# Patient Record
Sex: Female | Born: 1970 | Race: White | Hispanic: No | Smoking: Former smoker
Health system: Southern US, Community
[De-identification: ages and names within clinical notes are randomized; demographics above are authoritative.]

## PROBLEM LIST (undated history)

## (undated) DIAGNOSIS — F32A Depression, unspecified: Secondary | ICD-10-CM

## (undated) DIAGNOSIS — F4310 Post-traumatic stress disorder, unspecified: Secondary | ICD-10-CM

## (undated) DIAGNOSIS — R112 Nausea with vomiting, unspecified: Secondary | ICD-10-CM

## (undated) DIAGNOSIS — F319 Bipolar disorder, unspecified: Secondary | ICD-10-CM

## (undated) DIAGNOSIS — F419 Anxiety disorder, unspecified: Secondary | ICD-10-CM

## (undated) DIAGNOSIS — F329 Major depressive disorder, single episode, unspecified: Secondary | ICD-10-CM

## (undated) DIAGNOSIS — M797 Fibromyalgia: Secondary | ICD-10-CM

## (undated) DIAGNOSIS — R011 Cardiac murmur, unspecified: Secondary | ICD-10-CM

## (undated) DIAGNOSIS — Z9889 Other specified postprocedural states: Secondary | ICD-10-CM

## (undated) DIAGNOSIS — N946 Dysmenorrhea, unspecified: Secondary | ICD-10-CM

## (undated) DIAGNOSIS — D259 Leiomyoma of uterus, unspecified: Secondary | ICD-10-CM

## (undated) DIAGNOSIS — I10 Essential (primary) hypertension: Secondary | ICD-10-CM

## (undated) HISTORY — PX: DILATION AND CURETTAGE OF UTERUS: SHX78

## (undated) HISTORY — DX: Cardiac murmur, unspecified: R01.1

## (undated) HISTORY — DX: Leiomyoma of uterus, unspecified: D25.9

## (undated) HISTORY — DX: Post-traumatic stress disorder, unspecified: F43.10

## (undated) HISTORY — PX: ENDOMETRIAL ABLATION: SHX621

## (undated) HISTORY — DX: Bipolar disorder, unspecified: F31.9

## (undated) HISTORY — DX: Fibromyalgia: M79.7

---

## 1997-02-25 ENCOUNTER — Encounter (INDEPENDENT_AMBULATORY_CARE_PROVIDER_SITE_OTHER): Payer: Self-pay | Admitting: *Deleted

## 1998-02-07 ENCOUNTER — Encounter: Admission: RE | Admit: 1998-02-07 | Discharge: 1998-02-07 | Payer: Self-pay | Admitting: Family Medicine

## 1998-02-17 ENCOUNTER — Encounter: Admission: RE | Admit: 1998-02-17 | Discharge: 1998-02-17 | Payer: Self-pay | Admitting: Family Medicine

## 1998-03-07 ENCOUNTER — Encounter: Admission: RE | Admit: 1998-03-07 | Discharge: 1998-03-07 | Payer: Self-pay | Admitting: Family Medicine

## 1998-03-07 ENCOUNTER — Ambulatory Visit (HOSPITAL_COMMUNITY): Admission: RE | Admit: 1998-03-07 | Discharge: 1998-03-07 | Payer: Self-pay | Admitting: *Deleted

## 1998-03-28 ENCOUNTER — Encounter: Admission: RE | Admit: 1998-03-28 | Discharge: 1998-03-28 | Payer: Self-pay | Admitting: Family Medicine

## 1998-11-17 ENCOUNTER — Inpatient Hospital Stay (HOSPITAL_COMMUNITY): Admission: AD | Admit: 1998-11-17 | Discharge: 1998-11-17 | Payer: Self-pay | Admitting: Obstetrics and Gynecology

## 1999-02-06 ENCOUNTER — Encounter: Admission: RE | Admit: 1999-02-06 | Discharge: 1999-02-06 | Payer: Self-pay | Admitting: Family Medicine

## 1999-02-08 ENCOUNTER — Emergency Department (HOSPITAL_COMMUNITY): Admission: EM | Admit: 1999-02-08 | Discharge: 1999-02-08 | Payer: Self-pay | Admitting: Emergency Medicine

## 1999-03-30 ENCOUNTER — Encounter: Admission: RE | Admit: 1999-03-30 | Discharge: 1999-03-30 | Payer: Self-pay | Admitting: Family Medicine

## 2000-01-12 ENCOUNTER — Ambulatory Visit: Admission: RE | Admit: 2000-01-12 | Discharge: 2000-01-12 | Payer: Self-pay | Admitting: Obstetrics and Gynecology

## 2000-01-25 ENCOUNTER — Ambulatory Visit (HOSPITAL_COMMUNITY): Admission: RE | Admit: 2000-01-25 | Discharge: 2000-01-25 | Payer: Self-pay | Admitting: Obstetrics and Gynecology

## 2000-01-25 ENCOUNTER — Encounter: Payer: Self-pay | Admitting: Obstetrics and Gynecology

## 2000-06-27 ENCOUNTER — Inpatient Hospital Stay (HOSPITAL_COMMUNITY): Admission: AD | Admit: 2000-06-27 | Discharge: 2000-06-30 | Payer: Self-pay | Admitting: Obstetrics and Gynecology

## 2000-08-10 ENCOUNTER — Other Ambulatory Visit: Admission: RE | Admit: 2000-08-10 | Discharge: 2000-08-10 | Payer: Self-pay | Admitting: Obstetrics and Gynecology

## 2000-10-25 ENCOUNTER — Encounter: Admission: RE | Admit: 2000-10-25 | Discharge: 2000-10-25 | Payer: Self-pay | Admitting: Family Medicine

## 2000-10-25 ENCOUNTER — Ambulatory Visit (HOSPITAL_COMMUNITY): Admission: RE | Admit: 2000-10-25 | Discharge: 2000-10-25 | Payer: Self-pay | Admitting: Family Medicine

## 2001-07-10 ENCOUNTER — Encounter: Admission: RE | Admit: 2001-07-10 | Discharge: 2001-07-10 | Payer: Self-pay | Admitting: Family Medicine

## 2001-07-13 ENCOUNTER — Encounter: Admission: RE | Admit: 2001-07-13 | Discharge: 2001-07-13 | Payer: Self-pay | Admitting: Family Medicine

## 2001-09-07 ENCOUNTER — Encounter: Admission: RE | Admit: 2001-09-07 | Discharge: 2001-09-07 | Payer: Self-pay | Admitting: Family Medicine

## 2001-12-02 ENCOUNTER — Encounter: Payer: Self-pay | Admitting: Emergency Medicine

## 2001-12-02 ENCOUNTER — Emergency Department (HOSPITAL_COMMUNITY): Admission: EM | Admit: 2001-12-02 | Discharge: 2001-12-02 | Payer: Self-pay | Admitting: Emergency Medicine

## 2002-03-22 ENCOUNTER — Encounter: Admission: RE | Admit: 2002-03-22 | Discharge: 2002-03-22 | Payer: Self-pay | Admitting: Family Medicine

## 2002-04-15 ENCOUNTER — Inpatient Hospital Stay (HOSPITAL_COMMUNITY): Admission: AD | Admit: 2002-04-15 | Discharge: 2002-04-15 | Payer: Self-pay | Admitting: Obstetrics and Gynecology

## 2002-04-15 ENCOUNTER — Encounter: Payer: Self-pay | Admitting: Obstetrics and Gynecology

## 2002-04-16 ENCOUNTER — Ambulatory Visit (HOSPITAL_COMMUNITY): Admission: AD | Admit: 2002-04-16 | Discharge: 2002-04-16 | Payer: Self-pay | Admitting: Obstetrics and Gynecology

## 2002-04-16 ENCOUNTER — Encounter (INDEPENDENT_AMBULATORY_CARE_PROVIDER_SITE_OTHER): Payer: Self-pay | Admitting: Specialist

## 2002-05-23 ENCOUNTER — Other Ambulatory Visit: Admission: RE | Admit: 2002-05-23 | Discharge: 2002-05-23 | Payer: Self-pay | Admitting: Obstetrics and Gynecology

## 2002-12-10 ENCOUNTER — Encounter: Admission: RE | Admit: 2002-12-10 | Discharge: 2002-12-10 | Payer: Self-pay | Admitting: Family Medicine

## 2003-03-30 ENCOUNTER — Emergency Department (HOSPITAL_COMMUNITY): Admission: EM | Admit: 2003-03-30 | Discharge: 2003-03-30 | Payer: Self-pay | Admitting: Emergency Medicine

## 2003-04-02 ENCOUNTER — Encounter: Admission: RE | Admit: 2003-04-02 | Discharge: 2003-04-02 | Payer: Self-pay | Admitting: Family Medicine

## 2003-07-24 ENCOUNTER — Inpatient Hospital Stay (HOSPITAL_COMMUNITY): Admission: AD | Admit: 2003-07-24 | Discharge: 2003-07-24 | Payer: Self-pay | Admitting: Obstetrics and Gynecology

## 2004-02-17 ENCOUNTER — Other Ambulatory Visit: Admission: RE | Admit: 2004-02-17 | Discharge: 2004-02-17 | Payer: Self-pay | Admitting: Obstetrics and Gynecology

## 2004-08-21 ENCOUNTER — Emergency Department (HOSPITAL_COMMUNITY): Admission: EM | Admit: 2004-08-21 | Discharge: 2004-08-21 | Payer: Self-pay | Admitting: Family Medicine

## 2004-11-09 ENCOUNTER — Emergency Department (HOSPITAL_COMMUNITY): Admission: EM | Admit: 2004-11-09 | Discharge: 2004-11-09 | Payer: Self-pay | Admitting: Family Medicine

## 2004-12-05 ENCOUNTER — Emergency Department (HOSPITAL_COMMUNITY): Admission: EM | Admit: 2004-12-05 | Discharge: 2004-12-05 | Payer: Self-pay | Admitting: Emergency Medicine

## 2004-12-08 ENCOUNTER — Emergency Department (HOSPITAL_COMMUNITY): Admission: EM | Admit: 2004-12-08 | Discharge: 2004-12-08 | Payer: Self-pay | Admitting: Family Medicine

## 2004-12-21 ENCOUNTER — Emergency Department (HOSPITAL_COMMUNITY): Admission: EM | Admit: 2004-12-21 | Discharge: 2004-12-21 | Payer: Self-pay | Admitting: Family Medicine

## 2004-12-23 ENCOUNTER — Emergency Department (HOSPITAL_COMMUNITY): Admission: EM | Admit: 2004-12-23 | Discharge: 2004-12-23 | Payer: Self-pay | Admitting: Emergency Medicine

## 2005-01-14 ENCOUNTER — Encounter (INDEPENDENT_AMBULATORY_CARE_PROVIDER_SITE_OTHER): Payer: Self-pay | Admitting: *Deleted

## 2005-01-14 ENCOUNTER — Ambulatory Visit: Payer: Self-pay | Admitting: Obstetrics and Gynecology

## 2005-01-28 ENCOUNTER — Ambulatory Visit: Payer: Self-pay | Admitting: Obstetrics and Gynecology

## 2005-02-23 ENCOUNTER — Emergency Department (HOSPITAL_COMMUNITY): Admission: EM | Admit: 2005-02-23 | Discharge: 2005-02-23 | Payer: Self-pay | Admitting: Family Medicine

## 2005-10-07 ENCOUNTER — Emergency Department (HOSPITAL_COMMUNITY): Admission: EM | Admit: 2005-10-07 | Discharge: 2005-10-07 | Payer: Self-pay | Admitting: Emergency Medicine

## 2006-04-07 ENCOUNTER — Inpatient Hospital Stay (HOSPITAL_COMMUNITY): Admission: AD | Admit: 2006-04-07 | Discharge: 2006-04-07 | Payer: Self-pay | Admitting: Gynecology

## 2006-07-20 ENCOUNTER — Emergency Department (HOSPITAL_COMMUNITY): Admission: EM | Admit: 2006-07-20 | Discharge: 2006-07-20 | Payer: Self-pay | Admitting: Family Medicine

## 2006-09-29 ENCOUNTER — Ambulatory Visit (HOSPITAL_COMMUNITY): Admission: RE | Admit: 2006-09-29 | Discharge: 2006-09-29 | Payer: Self-pay | Admitting: Obstetrics

## 2006-11-24 DIAGNOSIS — F410 Panic disorder [episodic paroxysmal anxiety] without agoraphobia: Secondary | ICD-10-CM | POA: Insufficient documentation

## 2006-11-24 DIAGNOSIS — F329 Major depressive disorder, single episode, unspecified: Secondary | ICD-10-CM

## 2006-11-24 DIAGNOSIS — R079 Chest pain, unspecified: Secondary | ICD-10-CM | POA: Insufficient documentation

## 2006-11-24 DIAGNOSIS — F3289 Other specified depressive episodes: Secondary | ICD-10-CM | POA: Insufficient documentation

## 2006-11-24 DIAGNOSIS — I059 Rheumatic mitral valve disease, unspecified: Secondary | ICD-10-CM | POA: Insufficient documentation

## 2006-11-25 ENCOUNTER — Encounter (INDEPENDENT_AMBULATORY_CARE_PROVIDER_SITE_OTHER): Payer: Self-pay | Admitting: *Deleted

## 2006-12-21 ENCOUNTER — Emergency Department (HOSPITAL_COMMUNITY): Admission: EM | Admit: 2006-12-21 | Discharge: 2006-12-21 | Payer: Self-pay | Admitting: Family Medicine

## 2007-01-22 ENCOUNTER — Inpatient Hospital Stay (HOSPITAL_COMMUNITY): Admission: AD | Admit: 2007-01-22 | Discharge: 2007-01-22 | Payer: Self-pay | Admitting: Obstetrics & Gynecology

## 2007-02-10 ENCOUNTER — Ambulatory Visit (HOSPITAL_COMMUNITY): Admission: RE | Admit: 2007-02-10 | Discharge: 2007-02-10 | Payer: Self-pay | Admitting: Obstetrics

## 2007-02-10 ENCOUNTER — Encounter: Payer: Self-pay | Admitting: Obstetrics

## 2007-06-08 ENCOUNTER — Ambulatory Visit (HOSPITAL_COMMUNITY): Admission: RE | Admit: 2007-06-08 | Discharge: 2007-06-08 | Payer: Self-pay | Admitting: Obstetrics

## 2008-01-22 ENCOUNTER — Inpatient Hospital Stay (HOSPITAL_COMMUNITY): Admission: AD | Admit: 2008-01-22 | Discharge: 2008-01-23 | Payer: Self-pay | Admitting: Obstetrics & Gynecology

## 2008-02-07 ENCOUNTER — Ambulatory Visit: Payer: Self-pay | Admitting: Obstetrics and Gynecology

## 2008-02-13 ENCOUNTER — Ambulatory Visit (HOSPITAL_COMMUNITY): Admission: RE | Admit: 2008-02-13 | Discharge: 2008-02-13 | Payer: Self-pay | Admitting: Obstetrics and Gynecology

## 2008-02-28 ENCOUNTER — Ambulatory Visit: Payer: Self-pay | Admitting: Obstetrics and Gynecology

## 2009-02-23 ENCOUNTER — Emergency Department (HOSPITAL_COMMUNITY): Admission: EM | Admit: 2009-02-23 | Discharge: 2009-02-24 | Payer: Self-pay | Admitting: Emergency Medicine

## 2009-05-21 ENCOUNTER — Encounter: Payer: Self-pay | Admitting: Cardiovascular Disease

## 2009-06-12 ENCOUNTER — Ambulatory Visit: Payer: Self-pay | Admitting: Cardiovascular Disease

## 2009-06-12 ENCOUNTER — Ambulatory Visit: Payer: Self-pay

## 2009-06-12 DIAGNOSIS — R002 Palpitations: Secondary | ICD-10-CM | POA: Insufficient documentation

## 2009-07-15 ENCOUNTER — Ambulatory Visit: Payer: Self-pay

## 2009-07-15 ENCOUNTER — Ambulatory Visit: Payer: Self-pay | Admitting: Cardiovascular Disease

## 2009-07-15 ENCOUNTER — Ambulatory Visit (HOSPITAL_COMMUNITY): Admission: RE | Admit: 2009-07-15 | Discharge: 2009-07-15 | Payer: Self-pay | Admitting: Internal Medicine

## 2009-07-15 ENCOUNTER — Encounter: Payer: Self-pay | Admitting: Cardiovascular Disease

## 2009-07-23 ENCOUNTER — Ambulatory Visit: Payer: Self-pay | Admitting: Cardiovascular Disease

## 2009-07-23 DIAGNOSIS — F17200 Nicotine dependence, unspecified, uncomplicated: Secondary | ICD-10-CM | POA: Insufficient documentation

## 2011-01-05 LAB — URINALYSIS, ROUTINE W REFLEX MICROSCOPIC
Glucose, UA: NEGATIVE mg/dL
Ketones, ur: NEGATIVE mg/dL
Nitrite: NEGATIVE
Specific Gravity, Urine: 1.023 (ref 1.005–1.030)

## 2011-01-05 LAB — POCT I-STAT, CHEM 8
BUN: 13 mg/dL (ref 6–23)
Chloride: 108 mEq/L (ref 96–112)
Glucose, Bld: 106 mg/dL — ABNORMAL HIGH (ref 70–99)
Hemoglobin: 13.6 g/dL (ref 12.0–15.0)
Sodium: 141 mEq/L (ref 135–145)

## 2011-01-05 LAB — POCT CARDIAC MARKERS
CKMB, poc: <1 ng/mL — ABNORMAL LOW (ref 1.0–8.0)
Troponin i, poc: <0.05 ng/mL (ref 0.00–0.09)

## 2011-01-05 LAB — PREGNANCY, URINE: Preg Test, Ur: NEGATIVE

## 2011-02-09 NOTE — Group Therapy Note (Signed)
[name], Lori Jensen NO.:  [id number]   MEDICAL RECORD NO.:  [id number]          PATIENT TYPE:  WOC   LOCATION:  [facility]                   FACILITY:  [facility]   PHYSICIAN:  Argentina Donovan, MD        DATE OF BIRTH:  [birth year]   DATE OF SERVICE:  02/28/2008                                  CLINIC NOTE   The patient is a 40 year old white female who is a nonsmoker at the  present time who was a patient of Dr. Verdell Carmine and had tried many things  to control her heavy periods.  She did not want anything definite done  because she did not know if she wanted any more children for the future.  So she placed, somewhat with difficulty apparently, a Mirena IUD.  She  continued, although the bleeding less, she continued having, and they  did an ultrasound showed that the location was in the lower uterine  segment, the T portion appearing to be in the myometrium.  We have  discussed this with the patient and she desired removed.  We told her we  could replace it in several weeks to a month if she wanted.  In the  interim, since she is a nonsmoker, we could place her on Yaz.  She  thought that was a good idea, and she may continue on the Yaz if it is  satisfactory at controlling the bleeding.  She is not quite ready to  give up the idea of future childbearing, so hysterectomy and endometrial  ablation at the present time is out of the question.  The patient  decides she is going to have a replaced IUD, I would suggest that we pre  medicate her with Cytotec prior to her coming in, as they apparently had  a difficult time on insertion.   IMPRESSION:  Menometrorrhagia and intrauterine device that is not in  place properly and was removed.           ______________________________  Argentina Donovan, MD     PR/MEDQ  D:  02/28/2008  T:  02/28/2008  Job:  [id number]

## 2011-02-09 NOTE — Op Note (Signed)
NAMEKHALESSI, BLOUGH              ACCOUNT NO.:  [id number]   MEDICAL RECORD NO.:  [id number]          PATIENT TYPE:  AMB   LOCATION:  [facility]                           [facility]  [facility]   PHYSICIAN:  Charles A. Clearance Coots, M.D.DATE OF BIRTH:  [birth year]   DATE OF PROCEDURE:  02/10/2007  DATE OF DISCHARGE:                               OPERATIVE REPORT   PREOPERATIVE DIAGNOSIS:  Menorrhagia.   POSTOPERATIVE DIAGNOSIS:  Menorrhagia.   PROCEDURE:  Hysteroscopy, dilation and curettage.   SURGEON:  Coral Ceo, M.D.   ANESTHESIA:  MAC with paracervical block.   COMPLICATIONS:  None.   ESTIMATED BLOOD LOSS:  50 ml.   SPECIMEN:  Endometrial curettings.   FINDINGS:  Thick secretory appearing endometrial cavity.  No significant  polyps seen.   OPERATION:  The patient was brought to the operating room and after IV  sedation, legs were brought up in stirrups and the vagina was prepped  and draped in the usual sterile fashion.  The urinary bladder was  emptied of approximately 50 mL of clear urine.  Bimanual examination  revealed the uterus to be mid position, normal size.  Sterile weighted  speculum was inserted into the vaginal vault and the cervix was isolated  with a Sims retractor.  Anterior lip of the cervix was grasped with a  single-tooth tenaculum.  The 5 mm hysteroscope was easily introduced to  the uterine cavity and hysteroscopic survey was performed.  The  endometrium appeared lush and secretory from Premarin therapy that the  patient had been on.  There were no obvious polyps observed.  The  hysteroscope was removed and a small sharp curette was introduced into  the uterine cavity.  The endometrial surface was thoroughly curetted and  the specimen was submitted to pathology for evaluation.  All instruments  were then retired.  The patient tolerated the procedure well and was  transported to the recovery room in satisfactory condition.      Charles A. Clearance Coots, M.D.  Electronically Signed     CAH/MEDQ  D:  02/10/2007  T:  02/10/2007  Job:  [id number]

## 2011-02-12 NOTE — Discharge Summary (Signed)
[facility]  Patient:    Lori Jensen, Lori Jensen                     MRN: [id number] Adm. Date:  [id number] Disc. Date: [id number] Attending:  Michaele Offer                           Discharge Summary  ADMISSION DIAGNOSES:          1. Intrauterine pregnancy at 40 weeks.                               2. Prolonged premature rupture of membranes.  DISCHARGE DIAGNOSES:          1. Intrauterine pregnancy at 40 weeks.                               2. Prolonged premature rupture of membranes.                               3. Delivered.  PROCEDURES:                   Spontaneous vaginal delivery.  COMPLICATIONS:                None.  CONSULTATIONS:                None.  HISTORY AND PHYSICAL:         This is a 40 year old white female gravida 3, para 1-0-1-1 with an EGA of [redacted] weeks by an LMP consistent with an eight week ultrasound with an EDC of June 27, 2000, who presents with complaint of leaking fluid since June 25, 2000, which increased the evening prior to admission and on the morning of admission with a few contractions, good fetal movement and no vaginal bleeding. Evaluation in the office revealed positive Nitrazine and positive fern.  Prenatal care essentially uncomplicated.  The patient had a questionable history of mitral valve prolapse and had some palpitations in the second trimester. She had an echocardiogram performed which revealed no mitral valve prolapse.  PRENATAL LABS:                Blood type is O positive with a negative antibody screen.  RPR nonreactive.  Rubella equivocal.  Hepatitis B surface antigen negative.  Gonorrhea and chlamydia negative.  Triple screen normal. Glucose 92 and group B strep is negative.  PAST OB HISTORY:              In 1994, a vaginal delivery at 40 weeks, 7 pounds 14 ounces, complicated by meconium.  In 2000 she had a spontaneous abortion.  GYN HISTORY:                  Significant for history  of condylomata and history of an abnormal Pap smear with a normal biopsy.  ALLERGIES:                    CODEINE.  SOCIAL HISTORY:               She smoked less than half a pack a day during her pregnancy.  PHYSICAL EXAMINATION:  VITAL SIGNS:  She was afebrile with stable vital signs.  Fetal heart tracing was reactive with occasional mild variable deceleration and she was initially having irregular contractions.  ABDOMEN:                      Gravid and nontender with an estimated fetal weight of 7-1/2 pounds.  VAGINAL:                      On June 21, 2000, examination was 3, 50 and -2 and she was confirmed to have ruptured membranes on June 27, 2000.  HOSPITAL COURSE:              The patient was admitted and started on Pitocin induction for prolonged rupture of membranes.  She did develop regular contractions on the Pitocin but it did take quite a while to get her into active labor.  She did eventually receive an epidural when she got uncomfortable and this helped her enter active labor.  Once she did enter active labor, she finally progressed fairly well, reached complete and pushed well.  On the morning of June 28, 2000, she had an SVD of a viable female infant in the ROA position with Apgars of 7 and 8 with weight of 7 pounds 6 ounces over small perineal abrasions.  She had a loose nuchal cord x 1 which was reduced.  The placenta delivered spontaneous and was intact.  Her estimated blood loss was approximately 500 cc and her abrasions were hemostatic and not repaired.  Postpartum, her T-max was 100.2 and she was otherwise afebrile.  Predelivery hemoglobin was 13.4, post delivery 10.3.  She breast-fed her baby without complications and on the morning of postpartum day #2 she was stable for discharge home.  CONDITION ON DISCHARGE:       Stable.  DISPOSITION:                  The patient was discharged to home.  DISCHARGE DIET:               Her diet  is a regular diet.  ACTIVITY:                     Pelvic rest.  FOLLOW-UP:                    She will follow up in four to six weeks.  DISCHARGE MEDICATIONS:        Darvocet p.r.n. pain.  DISCHARGE INSTRUCTIONS:       She is given a discharge pamphlet. DD:  06/30/00 TD:  06/30/00 Job: [id number]

## 2011-02-12 NOTE — Op Note (Signed)
[facility]  Patient:    Lori Jensen, Lori Jensen Visit Number: [id number] MRN: [id number]          Service Type: DSU Location: [facility] Attending Physician:  Oliver Pila Dictated by:   Alvino Chapel, M.D. Proc. Date: 04/16/02 Admit Date:  04/16/2002 Discharge Date: 04/16/2002                             Operative Report  PREOPERATIVE DIAGNOSIS:       Incomplete abortion.  POSTOPERATIVE DIAGNOSIS:      Incomplete abortion.  OPERATION:                    Suction dilatation and curettage.  SURGEON:                      Alvino Chapel, M.D.  ANESTHESIA:                   LMAC and local, 1% lidocaine on the cervical block.  FINDINGS:                     The cervix was about 1 cm dilated with a 7-mm curette easily passed. There was a very small amount of tissue removed in two to three passes of the suction curette and the uterus contracted nicely at the conclusion of the procedure. There was minimal vaginal bleeding at the end of the procedure.  DESCRIPTION OF PROCEDURE:     The patient was taken to the operating room where [facility] anesthesia was obtained without difficulty. She was then prepped and draped in normal sterile fashion in the dorsal lithotomy position. Lidocaine 1% was then injected circumferentially around the cervix for a local block and then with the patient under [facility] anesthesia, the uterus sounded to approximately 7-8 cm. A 7-mm curette was placed within the uterus and in several passes a very small amount of retained products of conception were removed. At the conclusion of the procedure, the patient had minimal vaginal bleeding and one pass of a sharp curettage revealed no further products of conception. The specimen was sent with a specimen that was originally brought in by the patient to maternity admissions for evaluation and she was taken to the recovery room in stable condition. Dictated by:   Alvino Chapel, M.D. Attending Physician:  Oliver Pila DD:  04/16/02 TD:  04/19/02 Job: [id number]

## 2011-03-08 ENCOUNTER — Emergency Department (HOSPITAL_BASED_OUTPATIENT_CLINIC_OR_DEPARTMENT_OTHER)
Admission: EM | Admit: 2011-03-08 | Discharge: 2011-03-08 | Disposition: A | Discharge status: Home / Self Care | Payer: Managed Care, Other (non HMO) | Attending: Emergency Medicine | Admitting: Emergency Medicine

## 2011-03-08 DIAGNOSIS — H8110 Benign paroxysmal vertigo, unspecified ear: Secondary | ICD-10-CM | POA: Insufficient documentation

## 2011-03-08 LAB — PREGNANCY, URINE: Preg Test, Ur: NEGATIVE

## 2011-06-22 LAB — CBC
HCT: 35.6 — ABNORMAL LOW
RDW: 14.7
WBC: 8.6

## 2011-06-22 LAB — WET PREP, GENITAL

## 2011-07-22 ENCOUNTER — Encounter (HOSPITAL_COMMUNITY): Payer: Self-pay

## 2011-07-22 ENCOUNTER — Inpatient Hospital Stay (HOSPITAL_COMMUNITY): Payer: Managed Care, Other (non HMO)

## 2011-07-22 ENCOUNTER — Inpatient Hospital Stay (HOSPITAL_COMMUNITY)
Admission: AD | Admit: 2011-07-22 | Discharge: 2011-07-22 | Disposition: A | Discharge status: Home / Self Care | Payer: Managed Care, Other (non HMO) | Source: Ambulatory Visit | Attending: Obstetrics & Gynecology | Admitting: Obstetrics & Gynecology

## 2011-07-22 DIAGNOSIS — D259 Leiomyoma of uterus, unspecified: Secondary | ICD-10-CM

## 2011-07-22 DIAGNOSIS — N938 Other specified abnormal uterine and vaginal bleeding: Secondary | ICD-10-CM | POA: Insufficient documentation

## 2011-07-22 DIAGNOSIS — N939 Abnormal uterine and vaginal bleeding, unspecified: Secondary | ICD-10-CM

## 2011-07-22 DIAGNOSIS — N949 Unspecified condition associated with female genital organs and menstrual cycle: Secondary | ICD-10-CM | POA: Insufficient documentation

## 2011-07-22 DIAGNOSIS — IMO0001 Reserved for inherently not codable concepts without codable children: Secondary | ICD-10-CM

## 2011-07-22 DIAGNOSIS — N898 Other specified noninflammatory disorders of vagina: Secondary | ICD-10-CM

## 2011-07-22 HISTORY — DX: Anxiety disorder, unspecified: F41.9

## 2011-07-22 HISTORY — DX: Essential (primary) hypertension: I10

## 2011-07-22 HISTORY — DX: Dysmenorrhea, unspecified: N94.6

## 2011-07-22 HISTORY — DX: Major depressive disorder, single episode, unspecified: F32.9

## 2011-07-22 HISTORY — DX: Depression, unspecified: F32.A

## 2011-07-22 LAB — URINALYSIS, ROUTINE W REFLEX MICROSCOPIC
Bilirubin Urine: NEGATIVE
Ketones, ur: 15 mg/dL — AB
Leukocytes, UA: NEGATIVE
Nitrite: NEGATIVE
Protein, ur: NEGATIVE mg/dL
Urobilinogen, UA: 2 mg/dL — ABNORMAL HIGH (ref 0.0–1.0)
pH: 7 (ref 5.0–8.0)

## 2011-07-22 LAB — CBC
HCT: 38.4 % (ref 36.0–46.0)
MCHC: 34.1 g/dL (ref 30.0–36.0)
Platelets: 185 10*3/uL (ref 150–400)
RDW: 13.4 % (ref 11.5–15.5)
WBC: 7.9 10*3/uL (ref 4.0–10.5)

## 2011-07-22 LAB — POCT PREGNANCY, URINE: Preg Test, Ur: NEGATIVE

## 2011-07-22 MED ORDER — MEDROXYPROGESTERONE ACETATE 10 MG PO TABS: 10.0000 mg | ORAL_TABLET | Freq: Every day | ORAL | Status: DC

## 2011-07-22 MED ORDER — KETOROLAC TROMETHAMINE 60 MG/2ML IM SOLN
60.0000 mg | Freq: Once | INTRAMUSCULAR | Status: AC
  Administered 2011-07-22: 60 mg via INTRAMUSCULAR
  Filled 2011-07-22: qty 2

## 2011-07-22 MED ORDER — NAPROXEN SODIUM 550 MG PO TABS: 550.0000 mg | ORAL_TABLET | Freq: Two times a day (BID) | ORAL | Status: DC

## 2011-07-22 NOTE — Progress Notes (Signed)
GAVE WARM BLANKET. SMALL AMT BLEEDING PER PT

## 2011-07-22 NOTE — ED Provider Notes (Signed)
History     Chief Complaint  Patient presents with  . Vaginal Bleeding   HPINicole K Saleeby40 y.o.presents with complaints of heavy vaginal bleeding for 6 weeks and stopped for 3 days and then 3 days ago began again.  Has appt with Nestor Ramp for Nov 13.  Passing blood clots.  Had same sxs several years ago.  Treated by Dr. Clearance Coots  with multiple medications, ending with a D&C.  Tried Mirena and "my baby pushed it out".  Suggestion made for ablation or partial hysterectomy.  Reports mild abdominal pain on the right and comes and goes.      Past Medical History  Diagnosis Date  . Hypertension   . Dysmenorrhea   . Anxiety   . Depression     Past Surgical History  Procedure Date  . Dilation and curettage of uterus     No family history on file.  History  Substance Use Topics  . Smoking status: Current Everyday Smoker -- 1.0 packs/day  . Smokeless tobacco: Not on file  . Alcohol Use: 0.6 oz/week    1 Glasses of wine per week    Allergies:  Allergies  Allergen Reactions  . Codeine Anaphylaxis  . Doxycycline Rash  . Sulfa Antibiotics Rash    Prescriptions prior to admission  Medication Sig Dispense Refill  . ALPRAZolam (XANAX) 0.25 MG tablet Take 0.25 mg by mouth 2 (two) times daily as needed. For anxiety       . citalopram (CELEXA) 20 MG tablet Take 20 mg by mouth daily.        . hydrochlorothiazide (HYDRODIURIL) 25 MG tablet Take 12.5 mg by mouth daily.        Marland Kitchen L-Methylfolate (DEPLIN) 15 MG TABS Take 1 tablet by mouth daily.          Review of Systems  Constitutional: Positive for malaise/fatigue.  Gastrointestinal: Positive for nausea and abdominal pain (mild, off and on). Negative for vomiting.  Genitourinary:       Heavy bleeding with clots.  Neurological: Positive for weakness.   Physical Exam   Blood pressure 132/92, pulse 63, temperature 98.4 F (36.9 C), temperature source Oral, resp. rate 16, height 5\' 6"  (1.676 m), weight 170 lb (77.111 kg), last  menstrual period 07/19/2011, SpO2 98.00%.  Physical Exam  Constitutional: She is oriented to person, place, and time. She appears well-developed and well-nourished. She appears distressed.  Neck: Normal range of motion.  Respiratory: Effort normal.  GI: Soft. She exhibits no distension and no mass. There is tenderness. There is no rebound and no guarding.  Genitourinary: Uterus is enlarged and tender. Right adnexum displays no mass, no tenderness and no fullness. Left adnexum displays no mass, no tenderness and no fullness. There is bleeding (moderate bright red blood with 1 medium size clot) around the vagina. No tenderness around the vagina.  Neurological: She is alert and oriented to person, place, and time.  Skin: Skin is warm and dry.   Results for orders placed during the hospital encounter of 07/22/11 (from the past 24 hour(s))  URINALYSIS, ROUTINE W REFLEX MICROSCOPIC     Status: Abnormal   Collection Time   07/22/11  5:15 PM      Component Value Range   Color, Urine YELLOW  YELLOW    Appearance CLOUDY (*) CLEAR    Specific Gravity, Urine 1.015  1.005 - 1.030    pH 7.0  5.0 - 8.0    Glucose, UA NEGATIVE  NEGATIVE (mg/dL)  Hgb urine dipstick LARGE (*) NEGATIVE    Bilirubin Urine NEGATIVE  NEGATIVE    Ketones, ur 15 (*) NEGATIVE (mg/dL)   Protein, ur NEGATIVE  NEGATIVE (mg/dL)   Urobilinogen, UA 2.0 (*) 0.0 - 1.0 (mg/dL)   Nitrite NEGATIVE  NEGATIVE    Leukocytes, UA NEGATIVE  NEGATIVE   URINE MICROSCOPIC-ADD ON     Status: Abnormal   Collection Time   07/22/11  5:15 PM      Component Value Range   Squamous Epithelial / LPF RARE  RARE    WBC, UA 0-2  <3 (WBC/hpf)   RBC / HPF 21-50  <3 (RBC/hpf)   Bacteria, UA FEW (*) RARE   POCT PREGNANCY, URINE     Status: Normal   Collection Time   07/22/11  5:19 PM      Component Value Range   Preg Test, Ur NEGATIVE    CBC     Status: Normal   Collection Time   07/22/11  6:04 PM      Component Value Range   WBC 7.9  4.0 - 10.5  (K/uL)   RBC 4.39  3.87 - 5.11 (MIL/uL)   Hemoglobin 13.1  12.0 - 15.0 (g/dL)   HCT 09.6  04.5 - 40.9 (%)   MCV 87.5  78.0 - 100.0 (fL)   MCH 29.8  26.0 - 34.0 (pg)   MCHC 34.1  30.0 - 36.0 (g/dL)   RDW 81.1  91.4 - 78.2 (%)   Platelets 185  150 - 400 (K/uL)   MAU Course  Procedures  Gc/CHl culture to lab--cultures mislabeled and discarded by lab.  Ordered gc/chl on urine.  Did not reorder wet prep, patient is having moderate bleeding.  MDM Toradol 60mg  IM ordered for pain. 2000 care turned over to E. Rice, PA Assessment and Plan    KEY,EVE M 07/22/2011, 5:41 PM   Matt Holmes, NP 07/22/11 1959  I have assumed care of this pt from Jeani Sow, FNP.  Results for orders placed during the hospital encounter of 07/22/11 (from the past 24 hour(s))  URINALYSIS, ROUTINE W REFLEX MICROSCOPIC     Status: Abnormal   Collection Time   07/22/11  5:15 PM      Component Value Range   Color, Urine YELLOW  YELLOW    Appearance CLOUDY (*) CLEAR    Specific Gravity, Urine 1.015  1.005 - 1.030    pH 7.0  5.0 - 8.0    Glucose, UA NEGATIVE  NEGATIVE (mg/dL)   Hgb urine dipstick LARGE (*) NEGATIVE    Bilirubin Urine NEGATIVE  NEGATIVE    Ketones, ur 15 (*) NEGATIVE (mg/dL)   Protein, ur NEGATIVE  NEGATIVE (mg/dL)   Urobilinogen, UA 2.0 (*) 0.0 - 1.0 (mg/dL)   Nitrite NEGATIVE  NEGATIVE    Leukocytes, UA NEGATIVE  NEGATIVE   URINE MICROSCOPIC-ADD ON     Status: Abnormal   Collection Time   07/22/11  5:15 PM      Component Value Range   Squamous Epithelial / LPF RARE  RARE    WBC, UA 0-2  <3 (WBC/hpf)   RBC / HPF 21-50  <3 (RBC/hpf)   Bacteria, UA FEW (*) RARE   POCT PREGNANCY, URINE     Status: Normal   Collection Time   07/22/11  5:19 PM      Component Value Range   Preg Test, Ur NEGATIVE    CBC     Status: Normal   Collection Time  07/22/11  6:04 PM      Component Value Range   WBC 7.9  4.0 - 10.5 (K/uL)   RBC 4.39  3.87 - 5.11 (MIL/uL)   Hemoglobin 13.1  12.0 - 15.0 (g/dL)   HCT  59.5  63.8 - 75.6 (%)   MCV 87.5  78.0 - 100.0 (fL)   MCH 29.8  26.0 - 34.0 (pg)   MCHC 34.1  30.0 - 36.0 (g/dL)   RDW 43.3  29.5 - 18.8 (%)   Platelets 185  150 - 400 (K/uL)    US Transvaginal Non-ob  07/22/2011  *RADIOLOGY REPORT*  Clinical Data: Heavy vaginal bleeding.  History of D&C.  TRANSABDOMINAL AND TRANSVAGINAL ULTRASOUND OF PELVIS Technique:  Both transabdominal and transvaginal ultrasound examinations of the pelvis were performed. Transabdominal technique was performed for global imaging of the pelvis including uterus, ovaries, adnexal regions, and pelvic cul-de-sac.  Comparison: 02/13/2008   It was necessary to proceed with endovaginal exam following the transabdominal exam to visualize the uterus, ovaries, and adnexa  .  Findings:  Uterus: 9.1 x 5.8 x 7.6 cm.  A left-sided uterine fundal fibroid is intramural and measures 9 mm.  Endometrium: Arcuate type endometrium.  Normal for age, 9 mm.  Right ovary:  3.2 x 1.5 x 1.7 cm. Minimally complex follicle at 1.5 cm on image 47.  Left ovary: 4.7 x 4.6 x 4.2 cm.  4.4 x 4.2 x 3.7 cm anechoic lesion with enhanced through transmission.  Other findings: No free fluid  IMPRESSION:  1.  Arcuate type endometrium, without endometrial thickening. 2.  Single uterine fibroid. 3.  Simple appearing 4.4 cm left ovarian cyst.  Original Report Authenticated By: Consuello Bossier, M.D.   US Pelvis Complete  07/22/2011  *RADIOLOGY REPORT*  Clinical Data: Heavy vaginal bleeding.  History of D&C.  TRANSABDOMINAL AND TRANSVAGINAL ULTRASOUND OF PELVIS Technique:  Both transabdominal and transvaginal ultrasound examinations of the pelvis were performed. Transabdominal technique was performed for global imaging of the pelvis including uterus, ovaries, adnexal regions, and pelvic cul-de-sac.  Comparison: 02/13/2008   It was necessary to proceed with endovaginal exam following the transabdominal exam to visualize the uterus, ovaries, and adnexa  .  Findings:  Uterus: 9.1 x  5.8 x 7.6 cm.  A left-sided uterine fundal fibroid is intramural and measures 9 mm.  Endometrium: Arcuate type endometrium.  Normal for age, 9 mm.  Right ovary:  3.2 x 1.5 x 1.7 cm. Minimally complex follicle at 1.5 cm on image 47.  Left ovary: 4.7 x 4.6 x 4.2 cm.  4.4 x 4.2 x 3.7 cm anechoic lesion with enhanced through transmission.  Other findings: No free fluid  IMPRESSION:  1.  Arcuate type endometrium, without endometrial thickening. 2.  Single uterine fibroid. 3.  Simple appearing 4.4 cm left ovarian cyst.  Original Report Authenticated By: Consuello Bossier, M.D.    A/P: Irregular vag bleeding: pt Hgb is stable. Will tx with provera and anaprox. She has a f/u appt scheduled with her GYN provider. Discussed diet, activity, risks, and precautions.  Clinton Gallant. Jensen III, DrHSc, MPAS, PA-C   Henrietta Hoover, [place] 07/22/11 2145

## 2011-07-22 NOTE — Progress Notes (Signed)
Pt states not on bc pills, has bleed x6 weeks (not unusual per pt), stopped x3 days, then began bleeding heavily again. Changing tampon and pad q1.5 hrs. Has felt weak, irony taste in mouth. Denies abnormal vaginal d/c.

## 2011-07-23 LAB — GC/CHLAMYDIA PROBE AMP, URINE
Chlamydia, Swab/Urine, PCR: NEGATIVE
GC Probe Amp, Urine: NEGATIVE

## 2011-07-23 NOTE — ED Provider Notes (Signed)
Agree with above note.  Lori Jensen H. 07/23/2011 6:35 AM

## 2011-07-29 ENCOUNTER — Inpatient Hospital Stay (HOSPITAL_COMMUNITY)
Admission: AD | Admit: 2011-07-29 | Discharge: 2011-07-29 | Disposition: A | Discharge status: Home / Self Care | Payer: Managed Care, Other (non HMO) | Source: Ambulatory Visit | Attending: Obstetrics & Gynecology | Admitting: Obstetrics & Gynecology

## 2011-07-29 ENCOUNTER — Encounter (HOSPITAL_COMMUNITY): Payer: Self-pay | Admitting: *Deleted

## 2011-07-29 DIAGNOSIS — N949 Unspecified condition associated with female genital organs and menstrual cycle: Secondary | ICD-10-CM | POA: Insufficient documentation

## 2011-07-29 DIAGNOSIS — F17200 Nicotine dependence, unspecified, uncomplicated: Secondary | ICD-10-CM

## 2011-07-29 DIAGNOSIS — N920 Excessive and frequent menstruation with regular cycle: Secondary | ICD-10-CM

## 2011-07-29 DIAGNOSIS — N938 Other specified abnormal uterine and vaginal bleeding: Secondary | ICD-10-CM | POA: Insufficient documentation

## 2011-07-29 DIAGNOSIS — D219 Benign neoplasm of connective and other soft tissue, unspecified: Secondary | ICD-10-CM

## 2011-07-29 LAB — CBC
Hemoglobin: 12.9 g/dL (ref 12.0–15.0)
RBC: 4.39 MIL/uL (ref 3.87–5.11)
WBC: 8.2 10*3/uL (ref 4.0–10.5)

## 2011-07-29 MED ORDER — TRANEXAMIC ACID 650 MG PO TABS: 1300.0000 mg | ORAL_TABLET | Freq: Three times a day (TID) | ORAL | Status: DC

## 2011-07-29 MED ORDER — MEDROXYPROGESTERONE ACETATE 10 MG PO TABS: 10.0000 mg | ORAL_TABLET | Freq: Two times a day (BID) | ORAL | Status: DC

## 2011-07-29 NOTE — Progress Notes (Signed)
Pt states she is on her 8th day of provera.  States it helped slow her bleeding down at first, but became much heavier yesterday & today, passing clots.  Pt missed work today because of bleeding & dizziness.

## 2011-07-29 NOTE — ED Provider Notes (Signed)
Lori Jensen [id number] Chief Complaint  Patient presents with  . Vaginal Bleeding    SUBJECTIVE  HPI: She first presented here for the this problem 8 days ago giving a history of about 6 weeks of heavy vaginal bleeding which had stopped for 3 days and then recurred prior to her visit. She also relates having a similar episode several years ago which was treated with various hormones followed by D&C. Her evaluation here was a CBC; her hemoglobin was 13.6. UPT negative. She also had a pelvic ultrasound which showed a 9 mm intramural fibroid. Endometrial stripe was 9 mm. She was given Provera 10 mg tabs to take one a day until her appointment with [facility] which is on 08/11/2011. She has taken them as directed for the past 8 days. Last night she began bleeding very heavily again with clots and has been feeling dizzy even at rest ever since last night.  Past Medical History  Diagnosis Date  . Hypertension   . Dysmenorrhea   . Anxiety   . Depression     Past Surgical History  Procedure Date  . Dilation and curettage of uterus    History   Social History  . Marital Status: Divorced    Spouse Name: N/A    Number of Children: N/A  . Years of Education: N/A   Occupational History  . Not on file.   Social History Main Topics  . Smoking status: Current Everyday Smoker -- 1.0 packs/day for 20 years    Types: Cigarettes  . Smokeless tobacco: Not on file  . Alcohol Use: 0.6 oz/week    1 Glasses of wine per week  . Drug Use: No  . Sexually Active: Yes    Birth Control/ Protection: None   Other Topics Concern  . Not on file   Social History Narrative  . No narrative on file   No current facility-administered medications on file prior to encounter.   Current Outpatient Prescriptions on File Prior to Encounter  Medication Sig Dispense Refill  . ALPRAZolam (XANAX) 0.25 MG tablet Take 0.25 mg by mouth 2 (two) times daily as needed. For anxiety      . citalopram (CELEXA) 20 MG  tablet Take 20 mg by mouth daily.        . hydrochlorothiazide (HYDRODIURIL) 25 MG tablet Take 12.5 mg by mouth daily.       Marland Kitchen L-Methylfolate (DEPLIN) 15 MG TABS Take 1 tablet by mouth daily.         Allergies  Allergen Reactions  . Codeine Anaphylaxis  . Doxycycline Rash  . Sulfa Antibiotics Rash    ROS: Pertinent items in HPI  OBJECTIVE  BP 126/93  Pulse 74  Temp(Src) 99.6 F (37.6 C) (Oral)  Resp 20  Ht 5\' 7"  (1.702 m)  Wt 76.114 kg (167 lb 12.8 oz)  BMI 26.28 kg/m2  SpO2 98%  LMP 07/19/2011   Orthostatic vital signs were normal.Temp:  [99.6 F (37.6 C)] 99.6 F (37.6 C) (11/01 1541) Pulse Rate:  [65-75] 74  (11/01 1620) Resp:  [16-20] 20  (11/01 1620) BP: (121-133)/(85-99) 126/93 mmHg (11/01 1620) SpO2:  [98 %] 98 % (11/01 1541) Weight:  [76.114 kg (167 lb 12.8 oz)] 167 lb 12.8 oz (76.114 kg) (11/01 1541)  BP 126/93  Pulse 74  Temp(Src) 99.6 F (37.6 C) (Oral)  Resp 20  Ht 5\' 7"  (1.702 m)  Wt 76.114 kg (167 lb 12.8 oz)  BMI 26.28 kg/m2  SpO2 98%  LMP  07/19/2011  Physical Exam  Constitutional: She is well-developed, well-nourished, and in no distress. No distress.  HENT:  Head: Normocephalic.  Eyes: Pupils are equal, round, and reactive to light.  Neck: Neck supple.  Cardiovascular: Normal rate and regular rhythm.   Abdominal: Soft. She exhibits no distension and no mass. There is no tenderness. There is no rebound and no guarding.  Genitourinary: Vagina normal and cervix normal. No vaginal discharge found.       Small amt of vaginal bleeding; no clots. Uterus ULNS, NT  Skin: Skin is warm and dry.  Psychiatric: Affect normal.                                           sm amt blood. Ut ULNS, NT    ASSESSMENT  DUB/ menorrhagia. Hemodynamically stable   PLAN  Consulted Dr. Debroah Loop. Will increase her Provera to 20 mg by mouth daily unitl her appointment. Add Lysteda  650 mg 2 by mouth 3 times a day x5 days no refills keep  appointment at [facility] on 09/10/2011

## 2011-07-29 NOTE — Progress Notes (Signed)
Patient states she was seen in MAU last week for bleeding. Was iagnosed with fibroids and given 10 days of Provera. Bleeding is not getting heavier and continues to pass clots. Feels tired and weak. Also has a "funny" feeling in the side of the neck, nagging but not really painful.

## 2011-08-03 ENCOUNTER — Other Ambulatory Visit: Payer: Self-pay | Admitting: Obstetrics and Gynecology

## 2011-08-05 ENCOUNTER — Other Ambulatory Visit: Payer: Self-pay | Admitting: Obstetrics and Gynecology

## 2011-08-05 NOTE — Patient Instructions (Addendum)
   Your procedure is scheduled ZO:XWRUEA November 16th  Enter through the Main Entrance of [facility] at:10am Pick up the phone at the desk and dial [phone] and inform us of your arrival.  Please call this number if you have any problems the morning of surgery: [phone]  Remember: Do not eat food after midnight:Thursday Do not drink clear liquids after:midnight Thursday Take these medicines the morning of surgery with a SIP OF WATER:b/p medicine  Do not wear jewelry, make-up, or FINGER nail polish Do not wear lotions, powders, or perfumes.  You may not  wear deodorant. Do not shave 48 hours prior to surgery. Do not bring valuables to the hospital.    Patients discharged on the day of surgery will not be allowed to drive home.   Samantha Comer-sister-[phone]    Remember to use your hibiclens as instructed.Please shower with 1/2 bottle the evening before your surgery and the other 1/2 bottle the morning of surgery.

## 2011-08-06 ENCOUNTER — Encounter (HOSPITAL_COMMUNITY)
Admission: RE | Admit: 2011-08-06 | Discharge: 2011-08-06 | Disposition: A | Discharge status: Home / Self Care | Payer: Managed Care, Other (non HMO) | Source: Ambulatory Visit | Attending: Obstetrics and Gynecology | Admitting: Obstetrics and Gynecology

## 2011-08-06 ENCOUNTER — Encounter (HOSPITAL_COMMUNITY): Payer: Self-pay

## 2011-08-06 HISTORY — DX: Nausea with vomiting, unspecified: R11.2

## 2011-08-06 HISTORY — DX: Other specified postprocedural states: Z98.890

## 2011-08-06 LAB — CBC
HCT: 37.6 % (ref 36.0–46.0)
Hemoglobin: 12.6 g/dL (ref 12.0–15.0)
MCH: 29.5 pg (ref 26.0–34.0)
MCV: 88.1 fL (ref 78.0–100.0)
RBC: 4.27 MIL/uL (ref 3.87–5.11)

## 2011-08-06 LAB — BASIC METABOLIC PANEL
CO2: 28 mEq/L (ref 19–32)
Chloride: 103 mEq/L (ref 96–112)
GFR calc non Af Amer: >90 mL/min (ref >90)
Glucose, Bld: 135 mg/dL — ABNORMAL HIGH (ref 70–99)
Potassium: 3.5 mEq/L (ref 3.5–5.1)
Sodium: 137 mEq/L (ref 135–145)

## 2011-08-12 ENCOUNTER — Encounter (HOSPITAL_COMMUNITY): Payer: Self-pay | Admitting: Obstetrics and Gynecology

## 2011-08-12 NOTE — H&P (Signed)
Lori Jensen is an 40 y.o. female. G6 [id number] with a history of heavy and irregular periods. She now is being taken to the operating room for a D&C hysteroscopy and novasure endometrial ablation. In addition the patient has had an ASCUS pap smear which is HPV positive and will have an endocervical currettage and cervical biopsies.   Pertinent Gynecological History: Menses: flow is excessive with use of 6 pads or tampons on heaviest days Bleeding: dysfunctional uterine bleeding Contraception: condoms DES exposure: denies Blood transfusions: none Sexually transmitted diseases: no past history Previous GYN Procedures: DNC  Last pap: abnormal: ASCUS HPV positive Date: 08/03/2011    Menstrual History:  Patient's last menstrual period was 07/19/2011.    Past Medical History  Diagnosis Date  . Hypertension   . Dysmenorrhea   . Anxiety   . Depression   . PONV (postoperative nausea and vomiting)     Past Surgical History  Procedure Date  . Dilation and curettage of uterus     No family history on file.  Social History:  reports that she has been smoking Cigarettes.  She has a 20 pack-year smoking history. She does not have any smokeless tobacco history on file. She reports that she drinks about .6 ounces of alcohol per week. She reports that she does not use illicit drugs.  Allergies:  Allergies  Allergen Reactions  . Codeine Anaphylaxis  . Doxycycline Rash  . Sulfa Antibiotics Rash    No prescriptions prior to admission    Review of Systems  Constitutional: Negative.   HENT: Negative.   Eyes: Negative.   Respiratory: Negative.  Negative for cough, hemoptysis and shortness of breath.   Cardiovascular: Positive for palpitations. Negative for chest pain.  Gastrointestinal: Negative for heartburn, nausea, vomiting, abdominal pain, diarrhea and constipation.  Genitourinary: Negative for dysuria, urgency and frequency.  Musculoskeletal: Negative.   Skin: Negative.     Neurological: Negative.   Endo/Heme/Allergies: Negative.   Psychiatric/Behavioral: The patient is nervous/anxious.     Last menstrual period 07/19/2011. Physical Exam  Constitutional: She is oriented to person, place, and time. She appears well-developed and well-nourished.  HENT:  Head: Normocephalic and atraumatic.  Eyes: Conjunctivae are normal. Pupils are equal, round, and reactive to light.  Neck: Normal range of motion. Neck supple. No tracheal deviation present. No thyromegaly present.  Cardiovascular: Normal rate and regular rhythm.   Respiratory: Effort normal and breath sounds normal.  GI: Soft. Bowel sounds are normal. She exhibits no distension and no mass. There is no tenderness. There is no rebound and no guarding.  Musculoskeletal: Normal range of motion.  Neurological: She is alert and oriented to person, place, and time.  Skin: Skin is warm.  Psychiatric: She has a normal mood and affect.  GYN: External genitalia, wnl BUS, wnl Vagina, without lesion, Cervix, without lesion, non tender, Uterus, non tender anterior normal size, Adnexa, without mass, non tender.  No results found for this or any previous visit (from the past 24 hour(s)).  No results found.  Assessment/Plan: Irregular vaginal bleeding, menorrhagia, ASCUS  Plan: D&C hysteroscopy, Novasure endometrial ablation.           ECC and cervical biopsies.  Risks and benefits discussed  Lori Jensen 08/12/2011, 5:33 PM

## 2011-08-13 ENCOUNTER — Encounter (HOSPITAL_COMMUNITY)
Admission: RE | Disposition: A | Discharge status: Home / Self Care | Payer: Self-pay | Source: Ambulatory Visit | Attending: Obstetrics and Gynecology

## 2011-08-13 ENCOUNTER — Other Ambulatory Visit: Payer: Self-pay | Admitting: Obstetrics and Gynecology

## 2011-08-13 ENCOUNTER — Encounter (HOSPITAL_COMMUNITY): Payer: Self-pay | Admitting: *Deleted

## 2011-08-13 ENCOUNTER — Ambulatory Visit (HOSPITAL_COMMUNITY)
Admission: RE | Admit: 2011-08-13 | Discharge: 2011-08-13 | Disposition: A | Discharge status: Home / Self Care | Payer: Managed Care, Other (non HMO) | Source: Ambulatory Visit | Attending: Obstetrics and Gynecology | Admitting: Obstetrics and Gynecology

## 2011-08-13 ENCOUNTER — Ambulatory Visit (HOSPITAL_COMMUNITY): Payer: Managed Care, Other (non HMO) | Admitting: Anesthesiology

## 2011-08-13 ENCOUNTER — Encounter (HOSPITAL_COMMUNITY): Payer: Self-pay | Admitting: Anesthesiology

## 2011-08-13 DIAGNOSIS — Z01818 Encounter for other preprocedural examination: Secondary | ICD-10-CM | POA: Insufficient documentation

## 2011-08-13 DIAGNOSIS — N949 Unspecified condition associated with female genital organs and menstrual cycle: Secondary | ICD-10-CM | POA: Insufficient documentation

## 2011-08-13 DIAGNOSIS — N920 Excessive and frequent menstruation with regular cycle: Secondary | ICD-10-CM

## 2011-08-13 DIAGNOSIS — N938 Other specified abnormal uterine and vaginal bleeding: Secondary | ICD-10-CM | POA: Insufficient documentation

## 2011-08-13 DIAGNOSIS — Z01812 Encounter for preprocedural laboratory examination: Secondary | ICD-10-CM | POA: Insufficient documentation

## 2011-08-13 DIAGNOSIS — B977 Papillomavirus as the cause of diseases classified elsewhere: Secondary | ICD-10-CM | POA: Insufficient documentation

## 2011-08-13 HISTORY — PX: OTHER SURGICAL HISTORY: SHX169

## 2011-08-13 SURGERY — DILATATION & CURETTAGE/HYSTEROSCOPY WITH NOVASURE ABLATION
Anesthesia: General | Site: Vagina | Wound class: Clean Contaminated

## 2011-08-13 MED ORDER — LIDOCAINE HCL 1 % IJ SOLN
INTRAMUSCULAR | Status: DC | PRN
  Administered 2011-08-13: 10 mL

## 2011-08-13 MED ORDER — PROPOFOL 10 MG/ML IV EMUL
INTRAVENOUS | Status: AC
  Filled 2011-08-13: qty 20

## 2011-08-13 MED ORDER — ONDANSETRON HCL 4 MG/2ML IJ SOLN
INTRAMUSCULAR | Status: DC | PRN
  Administered 2011-08-13: 4 mg via INTRAVENOUS

## 2011-08-13 MED ORDER — MIDAZOLAM HCL 5 MG/5ML IJ SOLN
INTRAMUSCULAR | Status: DC | PRN
  Administered 2011-08-13: 2 mg via INTRAVENOUS

## 2011-08-13 MED ORDER — PROPOFOL 10 MG/ML IV EMUL
INTRAVENOUS | Status: DC | PRN
  Administered 2011-08-13: 200 mg via INTRAVENOUS

## 2011-08-13 MED ORDER — MIDAZOLAM HCL 2 MG/2ML IJ SOLN
INTRAMUSCULAR | Status: AC
  Filled 2011-08-13: qty 2

## 2011-08-13 MED ORDER — DEXAMETHASONE SODIUM PHOSPHATE 4 MG/ML IJ SOLN
INTRAMUSCULAR | Status: DC | PRN
  Administered 2011-08-13: 10 mg via INTRAVENOUS

## 2011-08-13 MED ORDER — FENTANYL CITRATE 0.05 MG/ML IJ SOLN
INTRAMUSCULAR | Status: AC
  Filled 2011-08-13: qty 2

## 2011-08-13 MED ORDER — GLYCOPYRROLATE 0.2 MG/ML IJ SOLN
INTRAMUSCULAR | Status: DC | PRN
  Administered 2011-08-13: 0.1 mg via INTRAVENOUS

## 2011-08-13 MED ORDER — KETOROLAC TROMETHAMINE 60 MG/2ML IM SOLN
INTRAMUSCULAR | Status: DC | PRN
  Administered 2011-08-13: 30 mg via INTRAMUSCULAR

## 2011-08-13 MED ORDER — FENTANYL CITRATE 0.05 MG/ML IJ SOLN
INTRAMUSCULAR | Status: AC
  Administered 2011-08-13: 50 ug via INTRAVENOUS
  Filled 2011-08-13: qty 2

## 2011-08-13 MED ORDER — LACTATED RINGERS IV SOLN
INTRAVENOUS | Status: DC
  Administered 2011-08-13: 500 mL/h via INTRAVENOUS
  Administered 2011-08-13 (×2): via INTRAVENOUS

## 2011-08-13 MED ORDER — DEXAMETHASONE SODIUM PHOSPHATE 10 MG/ML IJ SOLN
INTRAMUSCULAR | Status: AC
  Filled 2011-08-13: qty 1

## 2011-08-13 MED ORDER — FENTANYL CITRATE 0.05 MG/ML IJ SOLN
INTRAMUSCULAR | Status: DC | PRN
  Administered 2011-08-13 (×2): 100 ug via INTRAVENOUS
  Administered 2011-08-13: 50 ug via INTRAVENOUS

## 2011-08-13 MED ORDER — FENTANYL CITRATE 0.05 MG/ML IJ SOLN
25.0000 ug | INTRAMUSCULAR | Status: DC | PRN
  Administered 2011-08-13 (×3): 50 ug via INTRAVENOUS

## 2011-08-13 MED ORDER — LIDOCAINE HCL (CARDIAC) 20 MG/ML IV SOLN
INTRAVENOUS | Status: DC | PRN
  Administered 2011-08-13: 100 mg via INTRAVENOUS

## 2011-08-13 MED ORDER — ONDANSETRON HCL 4 MG/2ML IJ SOLN
INTRAMUSCULAR | Status: AC
  Filled 2011-08-13: qty 2

## 2011-08-13 MED ORDER — GLYCOPYRROLATE 0.2 MG/ML IJ SOLN
INTRAMUSCULAR | Status: AC
  Filled 2011-08-13: qty 2

## 2011-08-13 MED ORDER — KETOROLAC TROMETHAMINE 30 MG/ML IJ SOLN
INTRAMUSCULAR | Status: DC | PRN
  Administered 2011-08-13: 30 mg via INTRAVENOUS

## 2011-08-13 MED ORDER — LIDOCAINE HCL (CARDIAC) 20 MG/ML IV SOLN
INTRAVENOUS | Status: AC
  Filled 2011-08-13: qty 5

## 2011-08-13 MED ORDER — LACTATED RINGERS IR SOLN
Status: DC | PRN
  Administered 2011-08-13: 3000 mL

## 2011-08-13 MED ORDER — FENTANYL CITRATE 0.05 MG/ML IJ SOLN
INTRAMUSCULAR | Status: AC
  Filled 2011-08-13: qty 5

## 2011-08-13 MED ORDER — SCOPOLAMINE 1 MG/3DAYS TD PT72
1.0000 | MEDICATED_PATCH | TRANSDERMAL | Status: DC
  Administered 2011-08-13: 1.5 mg via TRANSDERMAL

## 2011-08-13 MED ORDER — SCOPOLAMINE 1 MG/3DAYS TD PT72
MEDICATED_PATCH | TRANSDERMAL | Status: AC
  Administered 2011-08-13: 1.5 mg via TRANSDERMAL
  Filled 2011-08-13: qty 1

## 2011-08-13 SURGICAL SUPPLY — 12 items
ABLATOR ENDOMETRIAL BIPOLAR (ABLATOR) ×2 IMPLANT
CATH ROBINSON RED A/P 16FR (CATHETERS) ×2 IMPLANT
CLOTH BEACON ORANGE TIMEOUT ST (SAFETY) ×2 IMPLANT
CONTAINER PREFILL 10% NBF 60ML (FORM) ×4 IMPLANT
GLOVE BIO SURGEON STRL SZ7.5 (GLOVE) ×4 IMPLANT
GOWN PREVENTION PLUS LG XLONG (DISPOSABLE) ×2 IMPLANT
GOWN PREVENTION PLUS XLARGE (GOWN DISPOSABLE) ×2 IMPLANT
NEEDLE SPNL 22GX3.5 QUINCKE BK (NEEDLE) ×2 IMPLANT
PACK HYSTEROSCOPY LF (CUSTOM PROCEDURE TRAY) ×2 IMPLANT
PAD PREP 24X48 CUFFED NSTRL (MISCELLANEOUS) ×2 IMPLANT
TOWEL OR 17X24 6PK STRL BLUE (TOWEL DISPOSABLE) ×4 IMPLANT
WATER STERILE IRR 1000ML POUR (IV SOLUTION) ×2 IMPLANT

## 2011-08-13 NOTE — Brief Op Note (Signed)
08/13/2011  11:55 AM  PATIENT:  Lori Jensen  40 y.o. female with heavy uterine bleeding and ASCUS  PRE-OPERATIVE DIAGNOSIS:  menorrhagia  POST-OPERATIVE DIAGNOSIS:  Menorrhagia, ASCUS  PROCEDURE:  Procedure(s): DILATATION & CURETTAGE/HYSTEROSCOPY WITH NOVASURE ABLATION, Cervical biopsy  SURGEON:  Surgeon(s): Miguel Aschoff   ANESTHESIA:   general  EBL:  Total I/O In: 1000 [I.V.:1000] Out: -   BLOOD ADMINISTERED:none  DRAINS: none   LOCAL MEDICATIONS USED:  LIDOCAINE 10 CC  SPECIMEN:  Source of Specimen:  Cervical biopsy, endocervical currettage, endometrial currettungs  DISPOSITION OF SPECIMEN:  PATHOLOGY  COUNTS:  YES  TOURNIQUET:  * No tourniquets in log *  DICTATION: .Other Dictation: Dictation Number [id number]  PLAN OF CARE: Discharge to home after PACU  PATIENT DISPOSITION:  PACU - hemodynamically stable.   Delay start of Pharmacological VTE agent (>24hrs) due to surgical blood loss or risk of bleeding:  {YES/NO/NOT APPLICABLE:20182

## 2011-08-13 NOTE — Anesthesia Preprocedure Evaluation (Signed)
Anesthesia Evaluation    Airway       Dental   Pulmonary          Cardiovascular     Neuro/Psych    GI/Hepatic   Endo/Other    Renal/GU      Musculoskeletal   Abdominal   Peds  Hematology   Anesthesia Other Findings   Reproductive/Obstetrics                           Anesthesia Physical Anesthesia Plan  ASA: II  Anesthesia Plan: General   Post-op Pain Management:    Induction: Intravenous  Airway Management Planned: LMA  Additional Equipment:   Intra-op Plan:   Post-operative Plan:   Informed Consent: I have reviewed the patients History and Physical, chart, labs and discussed the procedure including the risks, benefits and alternatives for the proposed anesthesia with the patient or authorized representative who has indicated his/her understanding and acceptance.   Dental Advisory Given  Plan Discussed with: CRNA and Surgeon  Anesthesia Plan Comments: (  Discussed  general anesthesia, including possible nausea, instrumentation of airway, sore throat,pulmonary aspiration, etc. I asked if the were any outstanding questions, or  concerns before we proceeded. )        Anesthesia Quick Evaluation

## 2011-08-13 NOTE — Transfer of Care (Signed)
Immediate Anesthesia Transfer of Care Note  Patient: Lori Jensen  Procedure(s) Performed:  DILATATION & CURETTAGE/HYSTEROSCOPY WITH NOVASURE ABLATION  Patient Location: PACU  Anesthesia Type: General  Level of Consciousness: awake, alert  and oriented  Airway & Oxygen Therapy: Patient Spontanous Breathing and Patient connected to nasal cannula oxygen  Post-op Assessment: Report given to PACU RN and Post -op Vital signs reviewed and stable  Post vital signs: Reviewed and stable  Complications: No apparent anesthesia complications

## 2011-08-13 NOTE — Op Note (Signed)
NAMETERIA, KHACHATRYAN NO.:  [id number]  MEDICAL RECORD NO.:  [id number]  LOCATION:  [facility]                          FACILITY:  [facility]  PHYSICIAN:  Miguel Aschoff, M.D.       DATE OF BIRTH:  [birth year]  DATE OF PROCEDURE:  08/13/2011 DATE OF DISCHARGE:  08/13/2011                              OPERATIVE REPORT   PREOPERATIVE DIAGNOSIS:  Menorrhagia, ASCUS positive for HPV.  POSTOPERATIVE DIAGNOSIS:  Menorrhagia, ASCUS positive for HPV.  PROCEDURE:  Cervical dilatation, hysteroscopy, uterine curettage, endocervical curettage, and cervical biopsy.  SURGEON:  Miguel Aschoff, MD  ANESTHESIA:  General.  COMPLICATIONS:  None.  JUSTIFICATION:  The patient is a 40 year old white female with history of very heavy menses.  Attempts were made to try to control her menses using medical therapy, however, this has failed.  She now presents to undergo definitive procedure in effort to control the heavy bleeding and has given her informed consent for D and C, hysteroscopy, and endometrial ablation.  In addition, the patient's most recent Pap smears returned with ASCUS HPV positive and at that time for ablation, she wanted to undergo endocervical curettage and cervical biopsies.  Risks and benefits of the procedure were discussed with the patient and informed consent had been obtained.  PROCEDURE:  The patient was taken to the operative room, placed in supine position.  General anesthesia was administered without difficulty.  She was then placed again in dorsal lithotomy position, prepped and draped in usual sterile fashion.  The bladder was catheterized.  Examination under anesthesia revealed normal external genitalia, normal Bartholin and Skene glands, normal urethra.  The vaginal vault was without gross lesions.  The cervix was without gross lesion.  The uterus was noted to be globular, enlarged somewhat by uterine fibroid.  The adnexa were without masses.  The uterine size  was approximately equivalent to an 8-[redacted] week gestation.  At this point, speculum was placed in the vaginal vault.  The anterior cervical lip was grasped with a tenaculum and then the uterus was sounded to 8 cm.  A cervical length of 3.5 cm was established for cavity length of 4.5 cm. At this point, endocervical curettage was carried out without difficulty.  Then the cervix was dilated using serial Pratt dilators until a #25 Pratt dilator could be passed.  At this point, the diagnostic hysteroscope was advanced through the endocervical canal.  No endocervical lesions were noted.  The endometrial cavity was inspected. There appeared to be moderate amount of normal-appearing endometrial tissue.  No submucous myomas or polyps were noted.  At this point, sharp vigorous curettage was carried out and the endometrial curettings were sent for histologic study as well as the endocervical curettings.  At this point, the NovaSure ablation instrument was placed into uterine cavity.  A cavity width of 5.2 cm was established and at this point a treatment cycle of 127 watts for 1 minute 45 seconds was carried out without difficulty.  On completion of the treatment cycle, the instrument was removed intact.  The hysteroscope was placed back in the uterine cavity.  The cavity appeared to be well ablated.  At this point, Lugol solution was placed  over the cervix.  It was taken up diffusely. No lesions were noted on the ectocervix.  Poor Lugol's uptake involving the transformation zone just at the opening of the endocervical canal. Three biopsies were taken at this site and sent for histologic study. At this point, Monsel's solution was placed.  There was good hemostasis. All instruments were removed.  The patient was taken out of lithotomy position, brought to the recovery room in satisfactory condition. Estimated blood loss for the procedure was approximately 20 mL.  The patient tolerated the procedure  well and went to the recovery room in satisfactory condition. The plan is for the patient to be discharged home.  Medications for home include Cataflam 50 mg one 3 times a day as needed for pain and Ceftin 250 mg twice a day x3 days.  The patient will be seen back in 4 weeks for followup examination.  She is to call for any problems such as fever, pain, or heavy bleeding.     Miguel Aschoff, M.D.     AR/MEDQ  D:  08/13/2011  T:  08/13/2011  Job:  [id number]

## 2011-08-13 NOTE — Anesthesia Postprocedure Evaluation (Signed)
  Anesthesia Post-op Note  Patient: Lori Jensen  Procedure(s) Performed:  DILATATION & CURETTAGE/HYSTEROSCOPY WITH NOVASURE ABLATION  Patient is awake and responsive. Pain and nausea are reasonably well controlled. Vital signs are stable and clinically acceptable. Oxygen saturation is clinically acceptable. There are no apparent anesthetic complications at this time. Patient is ready for discharge.

## 2011-08-13 NOTE — Anesthesia Procedure Notes (Signed)
Procedure Name: LMA Insertion Date/Time: 08/13/2011 11:30 AM Performed by: Karleen Dolphin Pre-anesthesia Checklist: Patient identified, Patient being monitored, Emergency Drugs available, Timeout performed and Suction available Patient Re-evaluated:Patient Re-evaluated prior to inductionOxygen Delivery Method: Circle System Utilized Preoxygenation: Pre-oxygenation with 100% oxygen Intubation Type: IV induction Ventilation: Mask ventilation without difficulty LMA: LMA inserted LMA Size: 4.0 Tube size: 4.0 mm Number of attempts: 1 Placement Confirmation: breath sounds checked- equal and bilateral and positive ETCO2 Tube secured with: Tape Dental Injury: Teeth and Oropharynx as per pre-operative assessment

## 2011-08-27 ENCOUNTER — Inpatient Hospital Stay (HOSPITAL_COMMUNITY): Payer: Managed Care, Other (non HMO)

## 2011-08-27 ENCOUNTER — Inpatient Hospital Stay (HOSPITAL_COMMUNITY)
Admission: AD | Admit: 2011-08-27 | Discharge: 2011-08-29 | DRG: 759 | Disposition: A | Discharge status: Home / Self Care | Payer: Managed Care, Other (non HMO) | Source: Ambulatory Visit | Attending: Obstetrics and Gynecology | Admitting: Obstetrics and Gynecology

## 2011-08-27 ENCOUNTER — Encounter (HOSPITAL_COMMUNITY): Payer: Self-pay

## 2011-08-27 DIAGNOSIS — N719 Inflammatory disease of uterus, unspecified: Principal | ICD-10-CM | POA: Diagnosis present

## 2011-08-27 DIAGNOSIS — J111 Influenza due to unidentified influenza virus with other respiratory manifestations: Secondary | ICD-10-CM | POA: Diagnosis present

## 2011-08-27 LAB — URINALYSIS, ROUTINE W REFLEX MICROSCOPIC
Bilirubin Urine: NEGATIVE
Glucose, UA: NEGATIVE mg/dL
Ketones, ur: NEGATIVE mg/dL
Leukocytes, UA: NEGATIVE
Nitrite: NEGATIVE
Specific Gravity, Urine: 1.025 (ref 1.005–1.030)
pH: 6 (ref 5.0–8.0)

## 2011-08-27 LAB — DIFFERENTIAL
Basophils Absolute: 0 10*3/uL (ref 0.0–0.1)
Eosinophils Relative: 1 % (ref 0–5)
Lymphocytes Relative: 10 % — ABNORMAL LOW (ref 12–46)
Lymphs Abs: 0.6 10*3/uL — ABNORMAL LOW (ref 0.7–4.0)
Monocytes Absolute: 0.8 10*3/uL (ref 0.1–1.0)
Neutro Abs: 4.7 10*3/uL (ref 1.7–7.7)

## 2011-08-27 LAB — CBC
HCT: 35.5 % — ABNORMAL LOW (ref 36.0–46.0)
Hemoglobin: 11.9 g/dL — ABNORMAL LOW (ref 12.0–15.0)
MCV: 85.7 fL (ref 78.0–100.0)
RBC: 4.14 MIL/uL (ref 3.87–5.11)
WBC: 6.2 10*3/uL (ref 4.0–10.5)

## 2011-08-27 LAB — COMPREHENSIVE METABOLIC PANEL
Albumin: 3.4 g/dL — ABNORMAL LOW (ref 3.5–5.2)
BUN: 9 mg/dL (ref 6–23)
Calcium: 8.9 mg/dL (ref 8.4–10.5)
Creatinine, Ser: 0.79 mg/dL (ref 0.50–1.10)
GFR calc Af Amer: >90 mL/min (ref >90)
Glucose, Bld: 110 mg/dL — ABNORMAL HIGH (ref 70–99)
Potassium: 3.1 mEq/L — ABNORMAL LOW (ref 3.5–5.1)
Total Protein: 6 g/dL (ref 6.0–8.3)

## 2011-08-27 MED ORDER — BUTORPHANOL TARTRATE 2 MG/ML IJ SOLN
1.0000 mg | INTRAMUSCULAR | Status: DC | PRN
  Administered 2011-08-27 – 2011-08-29 (×6): 2 mg via INTRAVENOUS
  Filled 2011-08-27 (×6): qty 1

## 2011-08-27 MED ORDER — ALUM & MAG HYDROXIDE-SIMETH 200-200-20 MG/5ML PO SUSP: 30.0000 mL | ORAL | Status: DC | PRN

## 2011-08-27 MED ORDER — KCL-LACTATED RINGERS 20 MEQ/L IV SOLN: INTRAVENOUS | Status: DC

## 2011-08-27 MED ORDER — ENOXAPARIN SODIUM 40 MG/0.4ML ~~LOC~~ SOLN
40.0000 mg | SUBCUTANEOUS | Status: DC
  Administered 2011-08-28 – 2011-08-29 (×2): 40 mg via SUBCUTANEOUS
  Filled 2011-08-27 (×3): qty 0.4

## 2011-08-27 MED ORDER — ONDANSETRON HCL 4 MG PO TABS: 4.0000 mg | ORAL_TABLET | Freq: Four times a day (QID) | ORAL | Status: DC | PRN

## 2011-08-27 MED ORDER — ZOLPIDEM TARTRATE 5 MG PO TABS: 5.0000 mg | ORAL_TABLET | Freq: Every evening | ORAL | Status: DC | PRN

## 2011-08-27 MED ORDER — DOCUSATE SODIUM 100 MG PO CAPS: 100.0000 mg | ORAL_CAPSULE | Freq: Two times a day (BID) | ORAL | Status: DC

## 2011-08-27 MED ORDER — ONDANSETRON HCL 4 MG/2ML IJ SOLN
4.0000 mg | Freq: Four times a day (QID) | INTRAMUSCULAR | Status: DC | PRN
  Administered 2011-08-29: 4 mg via INTRAVENOUS
  Filled 2011-08-27: qty 2

## 2011-08-27 MED ORDER — LACTATED RINGERS IV SOLN
INTRAVENOUS | Status: DC
  Administered 2011-08-27: 22:00:00 via INTRAVENOUS

## 2011-08-27 MED ORDER — SODIUM CHLORIDE 0.9 % IV SOLN
3.0000 g | Freq: Once | INTRAVENOUS | Status: AC
  Administered 2011-08-27: 3 g via INTRAVENOUS
  Filled 2011-08-27: qty 3

## 2011-08-27 NOTE — Progress Notes (Signed)
Patient ID: Lori Jensen, female   DOB: 06/21/1971, 40 y.o.   MRN: [id number]   Admission note.      Patient is a 40 year old white female with a history of menorrhagia who underwent an endometrial ablation on August 13, 2011 at the [facility]. The procedure was uncomplicated and patient was discharged home the same day. She presented to the office on November 28th complaining of lower abdominal pain and back pain, more on the left. She was afebrile and ultrasound at that time revealed only a simple left ovarian. There was no evidence of an hematometria. She was treated with Cipro 250 BID and sent home on Ultram. She presented to the ER tonight now with fever and worsening pain. In Triage her temp was 103. She has had a mild cough, but no sore throat or other sings of influenza. She is complaining of lower abdominal and mostly back pain. There is no nausea or vomiting. She denies dysuria, frequency or urgency. The is no history of diverticular disease.   ROS:  General: positive for fever and aching Resp: no sob, posive mild cough GI: see HPI GU: No dysuria, no hematuria Gyn: mild discharge since ablation  Allergies. Codeine, Doxycycline, Sulfa  Physical Exam  Temp 103    HEENT: PERRLA Chest: clear with occassional rhonchi Heart: regular rhythm no murmur or gallop Abdomen: Soft with mild lower abdominal tenderness, no rebound or guarding. BS are positive. No enlargement of liver kidneys or spleen.  Pelvic: Normal external genitalia, Normal BUS, vaginal with scant discharge             Cervix without lesion but tender touch and motion              Uterus is globular and tender to touch and motion              Adnexa are without masses and there is only mild tenderness Skin: warn without lrsion Neurologic: Intact oriented times 3  Impression: Fever lower abdominal pain following endometrial ablation                      URI  Plan: Admit and start IV antibiotics with Unasym 3  grams IV q6 hours           Get abdominal CT to look for other source of fever of signs of hematometria           Reassess in AM            Check blood and urine cultures.

## 2011-08-27 NOTE — Progress Notes (Signed)
Pt states, " I had an ablation on November 16 th. I started hurting on Monday and saw Dr Tenny Craw on Wednesday and he said I had a uterine infection and gave me antibiotics. Last night I woke up with a fever 101.0, then at work it was 102.0. I called Dr. Tenny Craw and he said to take the antibiotic and treat the symptoms like cold. This evening, my fever has gotten higher and my back pain is worse."

## 2011-08-27 NOTE — Progress Notes (Signed)
Patient is here stating that her fever is still high. She states that she took tylenol cold at 1500 and at 1700 it was 102. She c/o severe constant lower back pain. She states that on Wednesday dr Duane Lope told her that her uterus was swollen and possibly infected after the uterine ablation. She denies any vaginal bleeding. She is on cipro twice since Wednesday.

## 2011-08-28 ENCOUNTER — Inpatient Hospital Stay (HOSPITAL_COMMUNITY): Payer: Managed Care, Other (non HMO)

## 2011-08-28 LAB — INFLUENZA PANEL BY PCR (TYPE A & B)
H1N1 flu by pcr: NOT DETECTED
Influenza B By PCR: NEGATIVE

## 2011-08-28 MED ORDER — CITALOPRAM HYDROBROMIDE 20 MG PO TABS
20.0000 mg | ORAL_TABLET | Freq: Every day | ORAL | Status: DC
  Administered 2011-08-28 – 2011-08-29 (×2): 20 mg via ORAL
  Filled 2011-08-28 (×3): qty 1

## 2011-08-28 MED ORDER — SODIUM CHLORIDE 0.9 % IV SOLN
3.0000 g | Freq: Four times a day (QID) | INTRAVENOUS | Status: DC
  Administered 2011-08-28 – 2011-08-29 (×6): 3 g via INTRAVENOUS
  Filled 2011-08-28 (×8): qty 3

## 2011-08-28 MED ORDER — LACTATED RINGERS IV SOLN
INTRAVENOUS | Status: DC
  Administered 2011-08-28 – 2011-08-29 (×4): via INTRAVENOUS

## 2011-08-28 MED ORDER — INFLUENZA VIRUS VACC SPLIT PF IM SUSP
0.5000 mL | INTRAMUSCULAR | Status: DC
  Filled 2011-08-28: qty 0.5

## 2011-08-28 MED ORDER — KETOROLAC TROMETHAMINE 30 MG/ML IJ SOLN
30.0000 mg | Freq: Four times a day (QID) | INTRAMUSCULAR | Status: DC | PRN
  Administered 2011-08-28 – 2011-08-29 (×4): 30 mg via INTRAVENOUS
  Filled 2011-08-28 (×5): qty 1

## 2011-08-28 MED ORDER — IOHEXOL 300 MG/ML  SOLN
100.0000 mL | Freq: Once | INTRAMUSCULAR | Status: AC | PRN
  Administered 2011-08-28: 100 mL via INTRAVENOUS

## 2011-08-28 MED ORDER — OSELTAMIVIR PHOSPHATE 75 MG PO CAPS
75.0000 mg | ORAL_CAPSULE | Freq: Two times a day (BID) | ORAL | Status: DC
  Administered 2011-08-28 – 2011-08-29 (×2): 75 mg via ORAL
  Filled 2011-08-28 (×4): qty 1

## 2011-08-28 NOTE — Progress Notes (Signed)
Patient ID: Lori Jensen, female   DOB: 1971-05-25, 40 y.o.   MRN: [id number]  S: Doing better this morning but still with crampy lower abdominal pain  O Afebrile this AM  BP 110/69  Pulse 87       Abdomen is soft no rebound or guarding mild lower abdominal tenderness       CT findings consistent with inflammation of the uterus and a 4 cm cyst on right. No other acute findings.  A: Pelvic infection S/P endometrial ablation now responding to Unasyn.      URI  Plan: Continue IV antibiotics.           CBC in AM 08/29/2011            Add toradol for pain            Resume Celexa   Miguel Aschoff MD

## 2011-08-28 NOTE — Progress Notes (Signed)
Patient ID: Lori Jensen, female   DOB: Apr 25, 1971, 40 y.o.   MRN: [id number]   Patient is afebrile now but swab for influenza was positive.  Will begin PO tamiflu.  Miguel Aschoff MD

## 2011-08-29 LAB — CBC
HCT: 30.6 % — ABNORMAL LOW (ref 36.0–46.0)
MCHC: 33 g/dL (ref 30.0–36.0)
RDW: 13.4 % (ref 11.5–15.5)
WBC: 3.9 10*3/uL — ABNORMAL LOW (ref 4.0–10.5)

## 2011-08-29 MED ORDER — BUTALBITAL-APAP-CAFFEINE 50-325-40 MG PO TABS
1.0000 | ORAL_TABLET | ORAL | Status: DC | PRN
  Administered 2011-08-29 (×2): 1 via ORAL
  Filled 2011-08-29 (×2): qty 1

## 2011-08-29 MED ORDER — BUTALBITAL-APAP-CAFFEINE 50-325-40 MG PO TABS: 2.0000 | ORAL_TABLET | Freq: Four times a day (QID) | ORAL | Status: AC | PRN

## 2011-08-29 MED ORDER — ALPRAZOLAM 0.25 MG PO TABS
0.2500 mg | ORAL_TABLET | Freq: Three times a day (TID) | ORAL | Status: DC | PRN
  Administered 2011-08-29: 0.25 mg via ORAL
  Filled 2011-08-29: qty 1

## 2011-08-29 MED ORDER — OSELTAMIVIR PHOSPHATE 75 MG PO CAPS: 75.0000 mg | ORAL_CAPSULE | Freq: Two times a day (BID) | ORAL | Status: AC

## 2011-08-29 NOTE — Progress Notes (Signed)
Patient ID: Lori Jensen, female   DOB: 1970/12/06, 40 y.o.   MRN: [id number]  S: Doing much better. Pain is much improved. Headache is resolved.  O: Afebrile  V/S 128/80      Abdomen soft non tender.  A: Improved endometritis and influenza  P: Discharge home      RTC next week for recheck      Meds Augmentin 875 BID                 Tamiflu 75 mg BID      Resume all other meds except for Cipro.      No sex for 2 weeks       TO call for fever severe pain or heavy bleeding.  Miguel Aschoff MD

## 2011-08-29 NOTE — Progress Notes (Signed)
Patient ID: Lori Jensen, female   DOB: 1971-04-30, 40 y.o.   MRN: [id number]  S: reports less pain this AM but states she had a bad night with sweats and unusual dreams. Now reports headache.  O: Afebrile  V/S stable 117/77         Abdomen is soft less tender and there is no rebound or guarding.  A: Improving endometritis and influenza.    P: Will hep lock IV      Add Fioricet for headache.       If remains afebrile will likely D/C home this PM on Augmentin and tamiflu  Miguel Aschoff MD

## 2011-08-29 NOTE — Progress Notes (Signed)
D/c instructions reviewed with pt.  Pt. States understanding of same.  Prescriptions called into [facility] for pt. Per pt. Request.  No home equipment needed.  Ambulated to car with staff without incident.  D/c'd home with family.

## 2011-09-01 NOTE — Progress Notes (Signed)
UR chart review completed.  

## 2011-09-03 LAB — CULTURE, BLOOD (ROUTINE X 2)
Culture  Setup Time: 201212010156
Culture  Setup Time: 201212010156
Culture: NO GROWTH
Culture: NO GROWTH

## 2011-09-03 NOTE — Discharge Summary (Signed)
Lori Jensen, Lori Jensen NO.:  [id number]  MEDICAL RECORD NO.:  [id number]  LOCATION:  9319                          FACILITY:  [facility]  PHYSICIAN:  Miguel Aschoff, M.D.       DATE OF BIRTH:  04-28-1971  DATE OF ADMISSION:  08/27/2011 DATE OF DISCHARGE:  08/29/2011                              DISCHARGE SUMMARY   ADMISSION DIAGNOSES:  Endometritis, influenza.  FINAL DIAGNOSES:  Endometritis, influenza.  PROCEDURES:  None.  BRIEF HISTORY:  The patient is a 40 year old, white female, who underwent endometrial ablation for menorrhagia on August 13, 2011. The procedure was carried out without complications.  The patient did well and  was discharged home.  However, developed the onset of lower abdominal pain approximately 12 days after the procedure.  It was felt at that time that she had probable endometritis and was treated with Cipro 250 mg p.o. b.i.d. and Cataflam 50 mg p.o. t.i.d. because of the symptoms.  This was initially resulting in improved clinical status. However on the evening of August 27, 2011, the patient called reporting that she was having body aches, fevers, chills and a temperature as high as 101.  She presented to the [facility] that evening and was found to have a temperature of 103.  Because of her symptomatology, she was admitted to undergo intravenous antibiotic therapy because of concerns that she had a significant endometrial infection, but the patient also reported coughing coryza, body aches and symptoms consistent with also influenza.  Influenza swabs were taken and the patient was admitted to the floor for antibiotic therapy.  On admission, the patient's hemoglobin was stable in the 10 range.  Her white count was approximately 6000 with 10% lymphocytes, 30% monocytes, and 76% neutrophils.  The patient was started on Unasyn 3 g IV q.6 h. and when the result of the influenza swab returned positive, the patient was started on Tamiflu 75  mg p.o. b.i.d.  By the second postoperative day, the patient had a significant improvement in her symptoms and was able to be discharged home.  Medications for home included Augmentin 875 mg b.i.d.  She was told to continue the Tamiflu 75 mg b.i.d. for a total of 5 days.  She was also instructed to return to the office on September 02, 2011, for followup examination and to call if there are any problems such as fever, pain or heavy bleeding.  She was also instructed to refrain from sexual intercourse until all her low abdominal symptoms had resolved.  She was sent home in satisfactory condition on a regular diet and told to resume all her prior medications except for the Cipro.     Miguel Aschoff, M.D.     AR/MEDQ  D:  09/02/2011  T:  09/03/2011  Job:  [id number]

## 2011-10-10 NOTE — H&P (Signed)
41 year old white female who underwent an endometrial ablation on 08/13/2011 now presents with onset of fever to 102 lower abdominal pain and back pain. Also reports generalized malaise and fatigue. She has had some nasal drainage and cough. She was being treated as an outpatient for mild endometritis following the ablation with Cipro 250 BID. Due to the Fever and worsening symptoms she is now being admitted for IV antibiotic therapy and further testing to identify the source of the fever.  PMH: positive for anxiety, hypertension, depression, menorrhagia.  Allergies: Positive codeine and doxycycline  Meds: Alprazolam             Cipro 250 BID            Celexa 20 mg            HCTZ            Tylenol cold  ROS:            Resp: positive for non productive cough, no SOB         GI: no nausea, vomiting or diarrhea. Positive for lower abdominal pain and bloating         GU: no dysuria, frequency or urgency          Gyn: some dark vaginal drainage and pelvic and back pain  Physical Exam  Temp 102 Pulse 104 Resp: 18  Head: normocephalic and atraumatic, Eyes: PERRLA Neck: Supple no JVD no increase in thyroid Chest clear with occasional rhonchi Heart: Regular rhythm with no murmur or gallop Abdomen is soft but tender in lower quadrants with no rebound or guarding. BS are present. There is no organomegaly Pelvic: External genitalia are within normal limits              BUS: within normal limits              Vagina has dark brown discharge but no other lesion               Cervix with blood per os tender to touch and motion              Uterus is globular and tender to touch and motion              Adnexa with masses but tender Skin without lesion Neurologic exam grossly normal.  Impression: Endometritis following endometrial ablation                     Possible influenza  Plan; Unasyn 3 grams IV             Repeat CBC            IV fluids            Check for influenza

## 2012-02-25 ENCOUNTER — Encounter (HOSPITAL_BASED_OUTPATIENT_CLINIC_OR_DEPARTMENT_OTHER): Payer: Self-pay | Admitting: *Deleted

## 2012-02-25 ENCOUNTER — Emergency Department (HOSPITAL_BASED_OUTPATIENT_CLINIC_OR_DEPARTMENT_OTHER)
Admission: EM | Admit: 2012-02-25 | Discharge: 2012-02-25 | Disposition: A | Discharge status: Home / Self Care | Payer: Managed Care, Other (non HMO) | Attending: Emergency Medicine | Admitting: Emergency Medicine

## 2012-02-25 ENCOUNTER — Emergency Department (HOSPITAL_BASED_OUTPATIENT_CLINIC_OR_DEPARTMENT_OTHER): Payer: Managed Care, Other (non HMO)

## 2012-02-25 DIAGNOSIS — F17200 Nicotine dependence, unspecified, uncomplicated: Secondary | ICD-10-CM | POA: Insufficient documentation

## 2012-02-25 DIAGNOSIS — I10 Essential (primary) hypertension: Secondary | ICD-10-CM | POA: Insufficient documentation

## 2012-02-25 DIAGNOSIS — F329 Major depressive disorder, single episode, unspecified: Secondary | ICD-10-CM | POA: Insufficient documentation

## 2012-02-25 DIAGNOSIS — M7989 Other specified soft tissue disorders: Secondary | ICD-10-CM | POA: Insufficient documentation

## 2012-02-25 DIAGNOSIS — F411 Generalized anxiety disorder: Secondary | ICD-10-CM | POA: Insufficient documentation

## 2012-02-25 DIAGNOSIS — M79673 Pain in unspecified foot: Secondary | ICD-10-CM

## 2012-02-25 DIAGNOSIS — F3289 Other specified depressive episodes: Secondary | ICD-10-CM | POA: Insufficient documentation

## 2012-02-25 NOTE — ED Notes (Signed)
Pt reports hurting her pinky toe on right foot approximately 9 days ago. States bruising went away but right foot started hurting between big toe and second digit. States she noticed that her right foot started swelling up and pain is radiating up the right right leg. Pt states she was bitten by a bug a few days ago and is concerned regarding the right foot swelling.

## 2012-02-25 NOTE — ED Provider Notes (Signed)
History     CSN: [id number]  Arrival date & time 02/25/12  2021   First MD Initiated Contact with Patient 02/25/12 2033      Chief Complaint  Patient presents with  . Foot Swelling    (Consider location/radiation/quality/duration/timing/severity/associated sxs/prior treatment) HPI Comments: Dropped a candle on foot 9 days ago.  Was okay until about three days ago when she started with swelling, pain in the bottom of the foot and first two toes.  No other injury or trauma.  Patient is a 41 y.o. female presenting with lower extremity pain. The history is provided by the patient.  Foot Pain This is a new problem. Episode onset: 3 days ago. The problem occurs constantly. The problem has been gradually worsening. The symptoms are aggravated by walking (weight bearing). The symptoms are relieved by nothing. She has tried nothing for the symptoms.    Past Medical History  Diagnosis Date  . Hypertension   . Dysmenorrhea   . Anxiety   . Depression   . PONV (postoperative nausea and vomiting)     Past Surgical History  Procedure Date  . Dilation and curettage of uterus   . Uterine ablation 08/13/2011    History reviewed. No pertinent family history.  History  Substance Use Topics  . Smoking status: Current Everyday Smoker -- 1.0 packs/day for 20 years    Types: Cigarettes  . Smokeless tobacco: Not on file  . Alcohol Use: 0.6 oz/week    1 Glasses of wine per week    OB History    Grav Para Term Preterm Abortions TAB SAB Ect Mult Living   7 2 2  5 2 3   2       Review of Systems  All other systems reviewed and are negative.    Allergies  Codeine; Doxycycline; and Sulfa antibiotics  Home Medications   Current Outpatient Rx  Name Route Sig Dispense Refill  . ALPRAZOLAM 0.25 MG PO TABS Oral Take 0.25 mg by mouth 2 (two) times daily as needed. For anxiety    . CITALOPRAM HYDROBROMIDE 20 MG PO TABS Oral Take 20 mg by mouth daily.      Marland Kitchen HYDROCHLOROTHIAZIDE 25 MG PO  TABS Oral Take 12.5 mg by mouth daily.     Marland Kitchen PHENYLEPHRINE-DM-GG-APAP 5-10-200-325 MG PO TABS Oral Take 2 tablets by mouth daily as needed. Cold symptoms       BP 137/86  Pulse 72  Temp(Src) 98.4 F (36.9 C) (Oral)  Resp 18  Ht 5\' 6"  (1.676 m)  Wt 165 lb (74.844 kg)  BMI 26.63 kg/m2  SpO2 100%  Physical Exam  Nursing note and vitals reviewed. Constitutional: She is oriented to person, place, and time. She appears well-developed and well-nourished.  Neck: Normal range of motion. Neck supple.  Musculoskeletal:       There is ttp over the bottom of the foot near the mtp joint.  There is no redness, but there is mild swelling noted.    Neurological: She is alert and oriented to person, place, and time.  Skin: Skin is warm and dry.    ED Course  Procedures (including critical care time)  Labs Reviewed - No data to display No results found.   No diagnosis found.    MDM  The xrays look okay.  Will discharge with ibu, rest.  Follow up prn if not improving.  Doubt dvt.        Geoffery Lyons, MD 02/25/12 2106

## 2012-02-25 NOTE — ED Notes (Signed)
Patient transported to X-ray 

## 2012-03-21 ENCOUNTER — Encounter (HOSPITAL_BASED_OUTPATIENT_CLINIC_OR_DEPARTMENT_OTHER): Payer: Self-pay | Admitting: *Deleted

## 2012-03-21 ENCOUNTER — Emergency Department (HOSPITAL_BASED_OUTPATIENT_CLINIC_OR_DEPARTMENT_OTHER)
Admission: EM | Admit: 2012-03-21 | Discharge: 2012-03-21 | Disposition: A | Discharge status: Home / Self Care | Payer: Managed Care, Other (non HMO) | Attending: Emergency Medicine | Admitting: Emergency Medicine

## 2012-03-21 ENCOUNTER — Emergency Department (HOSPITAL_BASED_OUTPATIENT_CLINIC_OR_DEPARTMENT_OTHER): Payer: Managed Care, Other (non HMO)

## 2012-03-21 DIAGNOSIS — Z882 Allergy status to sulfonamides status: Secondary | ICD-10-CM | POA: Insufficient documentation

## 2012-03-21 DIAGNOSIS — I83893 Varicose veins of bilateral lower extremities with other complications: Secondary | ICD-10-CM | POA: Insufficient documentation

## 2012-03-21 DIAGNOSIS — I10 Essential (primary) hypertension: Secondary | ICD-10-CM | POA: Insufficient documentation

## 2012-03-21 DIAGNOSIS — F329 Major depressive disorder, single episode, unspecified: Secondary | ICD-10-CM | POA: Insufficient documentation

## 2012-03-21 DIAGNOSIS — F3289 Other specified depressive episodes: Secondary | ICD-10-CM | POA: Insufficient documentation

## 2012-03-21 DIAGNOSIS — F17200 Nicotine dependence, unspecified, uncomplicated: Secondary | ICD-10-CM | POA: Insufficient documentation

## 2012-03-21 DIAGNOSIS — F411 Generalized anxiety disorder: Secondary | ICD-10-CM | POA: Insufficient documentation

## 2012-03-21 DIAGNOSIS — I8390 Asymptomatic varicose veins of unspecified lower extremity: Secondary | ICD-10-CM

## 2012-03-21 MED ORDER — POTASSIUM CHLORIDE ER 10 MEQ PO TBCR: 10.0000 meq | EXTENDED_RELEASE_TABLET | ORAL | Status: DC

## 2012-03-21 MED ORDER — IBUPROFEN 800 MG PO TABS: 800.0000 mg | ORAL_TABLET | Freq: Three times a day (TID) | ORAL | Status: AC

## 2012-03-21 NOTE — ED Notes (Signed)
Patient states she was seen here approximately one month ago for right foot and ankle pain after dropping a heavy object on it.  States approximately 4 days ago, she developed pain and swelling in the right calf.  States she was told to come back if she developed calf pain.

## 2012-03-21 NOTE — ED Notes (Signed)
Patient transported to Ultrasound 

## 2012-03-21 NOTE — ED Provider Notes (Signed)
History     CSN: [id number]  Arrival date & time 03/21/12  1003   First MD Initiated Contact with Patient 03/21/12 1107      Chief Complaint  Patient presents with  . Leg Pain    right calf    (Consider location/radiation/quality/duration/timing/severity/associated sxs/prior treatment) HPI Comments: The patient had dropped a heavy candle on her right foot, in mid-May. She was seen on 02/25/12. At that time she was advised that if she felt leg swelling or pain in the calf she should return. She's had 3 or 4 days of swelling and pain in the calf, worse last night. Today his pain to walk. She therefore seeks reevaluation.  Patient is a 41 y.o. female presenting with leg pain. The history is provided by the patient and medical records. No language interpreter was used.  Leg Pain  The incident occurred more than 2 days ago. Injury mechanism: She had had an injury to the right foot he about a month ago. Pain location: Right calf. The quality of the pain is described as aching. The pain is at a severity of 1/10. The pain is mild. The pain has been constant since onset. She reports no foreign bodies present. The symptoms are aggravated by bearing weight. She has tried nothing for the symptoms.    Past Medical History  Diagnosis Date  . Hypertension   . Dysmenorrhea   . Anxiety   . Depression   . PONV (postoperative nausea and vomiting)     Past Surgical History  Procedure Date  . Dilation and curettage of uterus   . Uterine ablation 08/13/2011    No family history on file.  History  Substance Use Topics  . Smoking status: Current Everyday Smoker -- 1.0 packs/day for 20 years    Types: Cigarettes  . Smokeless tobacco: Not on file  . Alcohol Use: 0.6 oz/week    1 Glasses of wine per week    OB History    Grav Para Term Preterm Abortions TAB SAB Ect Mult Living   7 2 2  5 2 3   2       Review of Systems  All other systems reviewed and are negative.    Allergies    Codeine; Doxycycline; and Sulfa antibiotics  Home Medications   Current Outpatient Rx  Name Route Sig Dispense Refill  . ALPRAZOLAM 0.25 MG PO TABS Oral Take 0.25 mg by mouth 2 (two) times daily as needed. For anxiety    . HYDROCHLOROTHIAZIDE 25 MG PO TABS Oral Take 12.5 mg by mouth daily.       BP 129/91  Pulse 80  Temp 98.4 F (36.9 C) (Oral)  Resp 16  Wt 165 lb (74.844 kg)  SpO2 100%  Physical Exam  Nursing note and vitals reviewed. Constitutional: She appears well-developed and well-nourished. No distress.  HENT:  Head: Normocephalic.  Eyes: EOM are normal.  Neck: Neck supple.  Musculoskeletal:       She has mild right calf tenderness, mild swelling at the ankle on the right leg. She has intact pulses sensation and tendon function in the right foot.  Skin: Skin is warm and dry.  Psychiatric: She has a normal mood and affect. Her behavior is normal.    ED Course  Procedures (including critical care time)  11:23 AM She was seen and had physical examination. Venous ultrasound was ordered.  DVT study is negative.  Advised Ibuprofen 800 mg tid with food, use of support stockings. She  is going to increase the dose of HCTZ to 50 mg per day, so prescribed KCL 10 meq per day.  1. Varicose veins            Carleene Cooper III, MD 03/21/12 [phone]

## 2012-03-21 NOTE — Discharge Instructions (Signed)
Varicose Veins Varicose veins are veins that have become enlarged and twisted. CAUSES This condition is the result of valves in the veins not working properly. Valves in the veins help return blood from the leg to the heart. If these valves are damaged, blood flows backwards and backs up into the veins in the leg near the skin. This causes the veins to become larger. People who are on their feet a lot, who are pregnant, or who are overweight are more likely to develop varicose veins. SYMPTOMS   Bulging, twisted-appearing, bluish veins, most commonly found on the legs.   Leg pain or a feeling of heaviness. These symptoms may be worse at the end of the day.   Leg swelling.   Skin color changes.  DIAGNOSIS  Varicose veins can usually be diagnosed with an exam of your legs by your caregiver. He or she may recommend an ultrasound of your leg veins. TREATMENT  Most varicose veins can be treated at home.However, other treatments are available for people who have persistent symptoms or who want to treat the cosmetic appearance of the varicose veins. These include:  Laser treatment of very small varicose veins.   Medicine that is shot (injected) into the vein. This medicine hardens the walls of the vein and closes off the vein. This treatment is called sclerotherapy. Afterwards, you may need to wear clothing or bandages that apply pressure.   Surgery.  HOME CARE INSTRUCTIONS   Do not stand or sit in one position for long periods of time. Do not sit with your legs crossed. Rest with your legs raised during the day.   Wear elastic stockings or support hose. Do not wear other tight, encircling garments around the legs, pelvis, or waist.   Walk as much as possible to increase blood flow.   Raise the foot of your bed at night with 2-inch blocks.   If you get a cut in the skin over the vein and the vein bleeds, lie down with your leg raised and press on it with a clean cloth until the bleeding  stops. Then place a bandage (dressing) on the cut. See your caregiver if it continues to bleed or needs stitches.  SEEK MEDICAL CARE IF:   The skin around your ankle starts to break down.   You have pain, redness, tenderness, or hard swelling developing in your leg over a vein.   You are uncomfortable due to leg pain.  Document Released: 06/23/2005 Document Revised: 09/02/2011 Document Reviewed: 11/09/2010 [facility] Patient Information 2012 [place], [place].Compression Stockings Compression stockings are elastic stockings that "compress" your legs. This helps to increase blood flow, decrease swelling, and reduces the chance of getting blood clots in your lower legs. Compression stockings are used:  After surgery.   If you have a history of poor circulation.   If you are prone to blood clots.   If you have varicose veins.   If you sit or are bedridden for long periods of time.  WEARING COMPRESSION STOCKINGS  Your compression stockings should be worn as instructed by your caregiver.   Wearing the correct stocking size is important. Your caregiver can help measure and fit you to the correct size.   When wearing your stockings, do not allow the stockings to bunch up. This is especially important around your toes or behind your knees. Keep the stockings as smooth as possible.   Do not roll the stockings downward and leave them rolled down. This can form a restrictive band  around your legs and can decrease blood flow.   The stockings should be removed once a day for 1 hour or as instructed by your caregiver. When the stockings are taken off, inspect your legs and feet. Look for:   Open sores.   Red spots.   Puffy areas (swelling).   Anything that does not seem normal.  IMPORTANT INFORMATION ABOUT COMPRESSION STOCKINGS  The compression stockings should be clean, dry, and in good condition before you put them on.   Do not put lotion on your legs or feet. This makes it harder to put  the stockings on.   Change your stockings immediately if they become wet or soiled.   Do not wear stockings that are ripped or torn.   You may hand-wash or put your stockings in the washing machine. Use cold or warm water with mild detergent. Do not bleach your stockings. They may be air-dried or dried in the dryer on low heat.   If you have pain or have a feeling of "pins and needles" in your feet or legs, you may be wearing stockings that are too tight. Call your caregiver right away.  SEEK IMMEDIATE MEDICAL CARE IF:   You have numbness or tingling in your lower legs that does not get better quickly after the stockings are removed.   Your toes or feet become cold and blue.   You develop open sores or have red spots on your legs that do not go away.  MAKE SURE YOU:   Understand these instructions.   Will watch your condition.   Will get help right away if you are not doing well or get worse.  Document Released: 07/11/2009 Document Revised: 09/02/2011 Document Reviewed: 07/11/2009 [facility] Patient Information 2012 [place], [place].Compression Stockings Compression stockings are elastic stockings that "compress" your legs. This helps to increase blood flow, decrease swelling, and reduces the chance of getting blood clots in your lower legs. Compression stockings are used:  After surgery.   If you have a history of poor circulation.   If you are prone to blood clots.   If you have varicose veins.   If you sit or are bedridden for long periods of time.  WEARING COMPRESSION STOCKINGS  Your compression stockings should be worn as instructed by your caregiver.   Wearing the correct stocking size is important. Your caregiver can help measure and fit you to the correct size.   When wearing your stockings, do not allow the stockings to bunch up. This is especially important around your toes or behind your knees. Keep the stockings as smooth as possible.   Do not roll the stockings  downward and leave them rolled down. This can form a restrictive band around your legs and can decrease blood flow.   The stockings should be removed once a day for 1 hour or as instructed by your caregiver. When the stockings are taken off, inspect your legs and feet. Look for:   Open sores.   Red spots.   Puffy areas (swelling).   Anything that does not seem normal.  IMPORTANT INFORMATION ABOUT COMPRESSION STOCKINGS  The compression stockings should be clean, dry, and in good condition before you put them on.   Do not put lotion on your legs or feet. This makes it harder to put the stockings on.   Change your stockings immediately if they become wet or soiled.   Do not wear stockings that are ripped or torn.   You may hand-wash or  put your stockings in the washing machine. Use cold or warm water with mild detergent. Do not bleach your stockings. They may be air-dried or dried in the dryer on low heat.   If you have pain or have a feeling of "pins and needles" in your feet or legs, you may be wearing stockings that are too tight. Call your caregiver right away.  SEEK IMMEDIATE MEDICAL CARE IF:   You have numbness or tingling in your lower legs that does not get better quickly after the stockings are removed.   Your toes or feet become cold and blue.   You develop open sores or have red spots on your legs that do not go away.  MAKE SURE YOU:   Understand these instructions.   Will watch your condition.   Will get help right away if you are not doing well or get worse.  Document Released: 07/11/2009 Document Revised: 09/02/2011 Document Reviewed: 07/11/2009 [facility] Patient Information 2012 [place], [place].

## 2012-03-29 ENCOUNTER — Other Ambulatory Visit: Payer: Self-pay | Admitting: Obstetrics and Gynecology

## 2012-06-24 ENCOUNTER — Encounter (HOSPITAL_COMMUNITY): Payer: Self-pay | Admitting: Emergency Medicine

## 2012-06-24 ENCOUNTER — Emergency Department (HOSPITAL_COMMUNITY)
Admission: EM | Admit: 2012-06-24 | Discharge: 2012-06-24 | Disposition: A | Discharge status: Home / Self Care | Payer: Managed Care, Other (non HMO) | Source: Home / Self Care

## 2012-06-24 DIAGNOSIS — R51 Headache: Secondary | ICD-10-CM

## 2012-06-24 LAB — POCT PREGNANCY, URINE: Preg Test, Ur: NEGATIVE

## 2012-06-24 MED ORDER — KETOROLAC TROMETHAMINE 60 MG/2ML IM SOLN
INTRAMUSCULAR | Status: AC
  Filled 2012-06-24: qty 2

## 2012-06-24 MED ORDER — ISOMETHEPTENE-APAP-DICHLORAL 65-325-100 MG PO CAPS: 1.0000 | ORAL_CAPSULE | Freq: Four times a day (QID) | ORAL | Status: DC | PRN

## 2012-06-24 MED ORDER — METOCLOPRAMIDE HCL 5 MG/ML IJ SOLN
10.0000 mg | Freq: Once | INTRAMUSCULAR | Status: AC
  Administered 2012-06-24: 10 mg via INTRAMUSCULAR

## 2012-06-24 MED ORDER — DIPHENHYDRAMINE HCL 50 MG/ML IJ SOLN
INTRAMUSCULAR | Status: AC
  Filled 2012-06-24: qty 1

## 2012-06-24 MED ORDER — DIPHENHYDRAMINE HCL 50 MG/ML IJ SOLN
25.0000 mg | Freq: Once | INTRAMUSCULAR | Status: AC
  Administered 2012-06-24: 25 mg via INTRAMUSCULAR

## 2012-06-24 MED ORDER — METOCLOPRAMIDE HCL 5 MG/ML IJ SOLN
INTRAMUSCULAR | Status: AC
  Filled 2012-06-24: qty 2

## 2012-06-24 MED ORDER — KETOROLAC TROMETHAMINE 60 MG/2ML IM SOLN
60.0000 mg | Freq: Once | INTRAMUSCULAR | Status: AC
  Administered 2012-06-24: 60 mg via INTRAMUSCULAR

## 2012-06-24 NOTE — ED Provider Notes (Signed)
Medical screening examination/treatment/procedure(s) were performed by resident physician or non-physician practitioner and as supervising physician I was immediately available for consultation/collaboration.   Guadalupe Nickless DOUGLAS MD.    Cincere Deprey D Turrell Severt, MD 06/24/12 1921 

## 2012-06-24 NOTE — ED Notes (Signed)
Pt c/o headache since 06/22/12. Pt has dried Ibuprofen with only 1hr relief of pain. Pain radiates from head to neck and behind ears, feel like ears are clogged.  No hx of migraines. Pt is have blurred vision and is sensitive to light with nausea.  Pt request pregnancy test.

## 2012-06-24 NOTE — ED Provider Notes (Signed)
History     CSN: [id number]  Arrival date & time 06/24/12  1305   First MD Initiated Contact with Patient 06/24/12 1331      Chief Complaint  Patient presents with  . Headache    (Consider location/radiation/quality/duration/timing/severity/associated sxs/prior treatment) Patient is a 41 y.o. female presenting with headaches. The history is provided by the patient. No language interpreter was used.  Headache The primary symptoms include headaches. The symptoms began 2 days ago. The episode lasted 2 days. The symptoms are worsening.  The headache is not associated with photophobia or loss of balance.  Additional symptoms include pain. Additional symptoms do not include loss of balance or photophobia. Medical issues also include hypertension.  Pt complains of pain in neck muscles and head  Past Medical History  Diagnosis Date  . Hypertension   . Dysmenorrhea   . Anxiety   . Depression   . PONV (postoperative nausea and vomiting)     Past Surgical History  Procedure Date  . Dilation and curettage of uterus   . Uterine ablation 08/13/2011    History reviewed. No pertinent family history.  History  Substance Use Topics  . Smoking status: Current Every Day Smoker -- 1.0 packs/day for 20 years    Types: Cigarettes  . Smokeless tobacco: Not on file  . Alcohol Use: 0.6 oz/week    1 Glasses of wine per week    OB History    Grav Para Term Preterm Abortions TAB SAB Ect Mult Living   7 2 2  5 2 3   2       Review of Systems  Eyes: Negative for photophobia.  Neurological: Positive for headaches. Negative for loss of balance.  All other systems reviewed and are negative.    Allergies  Codeine; Doxycycline; and Sulfa antibiotics  Home Medications   Current Outpatient Rx  Name Route Sig Dispense Refill  . HYDROCHLOROTHIAZIDE 25 MG PO TABS Oral Take 12.5 mg by mouth daily.     Marland Kitchen ALPRAZOLAM 0.25 MG PO TABS Oral Take 0.25 mg by mouth 2 (two) times daily as needed. For  anxiety    . POTASSIUM CHLORIDE ER 10 MEQ PO TBCR Oral Take 1 tablet (10 mEq total) by mouth 1 day or 1 dose. 30 tablet 0    BP 147/96  Pulse 74  Temp 98.5 F (36.9 C) (Oral)  Resp 18  SpO2 100%  Physical Exam  Nursing note and vitals reviewed. Constitutional: She is oriented to person, place, and time. She appears well-developed and well-nourished.  HENT:  Head: Normocephalic and atraumatic.  Right Ear: External ear normal.  Left Ear: External ear normal.  Nose: Nose normal.  Mouth/Throat: Oropharynx is clear and moist.  Eyes: Conjunctivae normal and EOM are normal. Pupils are equal, round, and reactive to light.  Neck: Normal range of motion. Neck supple.  Cardiovascular: Normal rate and regular rhythm.   Pulmonary/Chest: Effort normal and breath sounds normal.  Abdominal: Soft.  Musculoskeletal: Normal range of motion.  Neurological: She is alert and oriented to person, place, and time. She has normal reflexes.  Skin: Skin is warm.  Psychiatric: She has a normal mood and affect.    ED Course  Procedures (including critical care time)  Labs Reviewed - No data to display No results found.   1. Headache       MDM  Pt given torodol, reglan and benadryl.   Pt reports she feels much better.  Pregnancy test negative  Lonia Skinner [place], [place] 06/24/12 1527

## 2012-06-28 ENCOUNTER — Emergency Department (HOSPITAL_BASED_OUTPATIENT_CLINIC_OR_DEPARTMENT_OTHER): Payer: Managed Care, Other (non HMO)

## 2012-06-28 ENCOUNTER — Encounter (HOSPITAL_BASED_OUTPATIENT_CLINIC_OR_DEPARTMENT_OTHER): Payer: Self-pay | Admitting: *Deleted

## 2012-06-28 ENCOUNTER — Emergency Department (HOSPITAL_BASED_OUTPATIENT_CLINIC_OR_DEPARTMENT_OTHER)
Admission: EM | Admit: 2012-06-28 | Discharge: 2012-06-29 | Disposition: A | Discharge status: Home / Self Care | Payer: Managed Care, Other (non HMO) | Attending: Emergency Medicine | Admitting: Emergency Medicine

## 2012-06-28 DIAGNOSIS — F341 Dysthymic disorder: Secondary | ICD-10-CM | POA: Insufficient documentation

## 2012-06-28 DIAGNOSIS — R51 Headache: Secondary | ICD-10-CM | POA: Insufficient documentation

## 2012-06-28 DIAGNOSIS — I10 Essential (primary) hypertension: Secondary | ICD-10-CM | POA: Insufficient documentation

## 2012-06-28 LAB — COMPREHENSIVE METABOLIC PANEL
Albumin: 4.2 g/dL (ref 3.5–5.2)
BUN: 13 mg/dL (ref 6–23)
Calcium: 9.4 mg/dL (ref 8.4–10.5)
GFR calc Af Amer: >90 mL/min (ref >90)
Glucose, Bld: 101 mg/dL — ABNORMAL HIGH (ref 70–99)
Total Protein: 7 g/dL (ref 6.0–8.3)

## 2012-06-28 LAB — CBC WITH DIFFERENTIAL/PLATELET
Basophils Relative: 0 % (ref 0–1)
Eosinophils Absolute: 0.4 10*3/uL (ref 0.0–0.7)
Eosinophils Relative: 5 % (ref 0–5)
Hemoglobin: 15.1 g/dL — ABNORMAL HIGH (ref 12.0–15.0)
Lymphs Abs: 3.1 10*3/uL (ref 0.7–4.0)
MCH: 30 pg (ref 26.0–34.0)
MCHC: 35.5 g/dL (ref 30.0–36.0)
MCV: 84.5 fL (ref 78.0–100.0)
Monocytes Relative: 8 % (ref 3–12)
Platelets: 217 10*3/uL (ref 150–400)
RBC: 5.03 MIL/uL (ref 3.87–5.11)

## 2012-06-28 LAB — D-DIMER, QUANTITATIVE: D-Dimer, Quant: <0.27 ug/mL-FEU (ref 0.00–0.48)

## 2012-06-28 LAB — URINALYSIS, ROUTINE W REFLEX MICROSCOPIC
Bilirubin Urine: NEGATIVE
Ketones, ur: NEGATIVE mg/dL
Leukocytes, UA: NEGATIVE
Nitrite: NEGATIVE
Protein, ur: NEGATIVE mg/dL
pH: 5 (ref 5.0–8.0)

## 2012-06-28 LAB — TROPONIN I: Troponin I: <0.3 ng/mL (ref <0.30)

## 2012-06-28 MED ORDER — METOCLOPRAMIDE HCL 5 MG/ML IJ SOLN
10.0000 mg | Freq: Once | INTRAMUSCULAR | Status: DC
  Filled 2012-06-28: qty 2

## 2012-06-28 MED ORDER — KETOROLAC TROMETHAMINE 30 MG/ML IJ SOLN
30.0000 mg | Freq: Once | INTRAMUSCULAR | Status: AC
  Administered 2012-06-28: 30 mg via INTRAVENOUS
  Filled 2012-06-28: qty 1

## 2012-06-28 MED ORDER — IPRATROPIUM BROMIDE 0.02 % IN SOLN: 0.5000 mg | Freq: Once | RESPIRATORY_TRACT | Status: DC

## 2012-06-28 MED ORDER — DIPHENHYDRAMINE HCL 50 MG/ML IJ SOLN
25.0000 mg | Freq: Once | INTRAMUSCULAR | Status: DC
  Filled 2012-06-28: qty 1

## 2012-06-28 MED ORDER — ONDANSETRON 4 MG PO TBDP: 2.0000 mg | ORAL_TABLET | Freq: Once | ORAL | Status: DC

## 2012-06-28 NOTE — ED Provider Notes (Signed)
History     CSN: [id number]  Arrival date & time 06/28/12  1845   First MD Initiated Contact with Patient 06/28/12 2230      No chief complaint on file.   (Consider location/radiation/quality/duration/timing/severity/associated sxs/prior treatment) Patient is a 41 y.o. female presenting with headaches. The history is provided by the patient.  Headache  This is a chronic problem. The current episode started more than 1 week ago. The problem occurs constantly. The problem has not changed since onset.The headache is associated with nothing. The pain is located in the occipital region. The quality of the pain is described as throbbing. The pain is severe. The pain does not radiate. Pertinent negatives include no anorexia, no fever, no malaise/fatigue, no chest pressure, no near-syncope, no orthopnea, no palpitations, no syncope, no shortness of breath, no nausea and no vomiting.  Had left posterior axillary pain worse with inspiration > 24 hours constantly.  No weakness no numbness no changes in vision speech nor cognition.  No OCP no steroids  Past Medical History  Diagnosis Date  . Hypertension   . Dysmenorrhea   . Anxiety   . Depression   . PONV (postoperative nausea and vomiting)     Past Surgical History  Procedure Date  . Dilation and curettage of uterus   . Uterine ablation 08/13/2011    History reviewed. No pertinent family history.  History  Substance Use Topics  . Smoking status: Current Every Day Smoker -- 1.0 packs/day for 20 years    Types: Cigarettes  . Smokeless tobacco: Not on file  . Alcohol Use: 0.6 oz/week    1 Glasses of wine per week    OB History    Grav Para Term Preterm Abortions TAB SAB Ect Mult Living   7 2 2  5 2 3   2       Review of Systems  Constitutional: Negative for fever and malaise/fatigue.  HENT: Negative for neck pain and neck stiffness.   Respiratory: Negative for chest tightness and shortness of breath.   Cardiovascular: Negative  for chest pain, palpitations, orthopnea, leg swelling, syncope and near-syncope.  Gastrointestinal: Negative for nausea, vomiting and anorexia.  Neurological: Positive for light-headedness and headaches. Negative for dizziness, tremors, seizures, syncope, facial asymmetry, speech difficulty and numbness.  All other systems reviewed and are negative.    Allergies  Codeine; Doxycycline; and Sulfa antibiotics  Home Medications   Current Outpatient Rx  Name Route Sig Dispense Refill  . ALPRAZOLAM 0.25 MG PO TABS Oral Take 0.25 mg by mouth 2 (two) times daily as needed. For anxiety    . HYDROCHLOROTHIAZIDE 25 MG PO TABS Oral Take 12.5 mg by mouth daily.     . ISOMETHEPTENE-APAP-DICHLORAL 65-325-100 MG PO CAPS Oral Take 1 capsule by mouth 4 (four) times daily as needed. 30 capsule 0  . POTASSIUM CHLORIDE ER 10 MEQ PO TBCR Oral Take 1 tablet (10 mEq total) by mouth 1 day or 1 dose. 30 tablet 0    BP 106/66  Pulse 63  Temp 98.2 F (36.8 C) (Oral)  Resp 18  Ht 5\' 6"  (1.676 m)  Wt 165 lb (74.844 kg)  BMI 26.63 kg/m2  SpO2 98%  Physical Exam  Constitutional: She is oriented to person, place, and time. She appears well-developed and well-nourished. No distress.  HENT:  Head: Normocephalic and atraumatic.  Mouth/Throat: Oropharynx is clear and moist.  Eyes: Conjunctivae normal and EOM are normal. Pupils are equal, round, and reactive to light.  Neck:  Normal range of motion. Neck supple.  Cardiovascular: Normal rate and regular rhythm.   Pulmonary/Chest: Effort normal and breath sounds normal. She has no wheezes. She has no rales.  Abdominal: Soft. Bowel sounds are normal. There is no tenderness. There is no rebound and no guarding.  Musculoskeletal: Normal range of motion. She exhibits no edema and no tenderness.  Lymphadenopathy:    She has no cervical adenopathy.  Neurological: She is alert and oriented to person, place, and time. She has normal reflexes. She displays normal reflexes.  No cranial nerve deficit.  Skin: Skin is warm and dry.  Psychiatric: She has a normal mood and affect.    ED Course  Procedures (including critical care time)  Labs Reviewed  URINALYSIS, ROUTINE W REFLEX MICROSCOPIC - Abnormal; Notable for the following:    APPearance CLOUDY (*)     All other components within normal limits  CBC WITH DIFFERENTIAL - Abnormal; Notable for the following:    Hemoglobin 15.1 (*)     All other components within normal limits  COMPREHENSIVE METABOLIC PANEL - Abnormal; Notable for the following:    Potassium 3.4 (*)     Glucose, Bld 101 (*)     All other components within normal limits  PREGNANCY, URINE  D-DIMER, QUANTITATIVE  TROPONIN I   Dg Chest 2 View  06/28/2012  *RADIOLOGY REPORT*  Clinical Data: Pain  CHEST - 2 VIEW  Comparison: Feb 24, 2009  Findings: Lungs clear.  Heart size and pulmonary vascularity are normal.  No adenopathy.  No bone lesions.  IMPRESSION: Lungs clear.   Original Report Authenticated By: Arvin Collard. WOODRUFF III, M.D.      No diagnosis found.    MDM   Date: 06/28/2012  Rate: 61  Rhythm: normal sinus rhythm  QRS Axis: normal  Intervals: normal  ST/T Wave abnormalities: normal  Conduction Disutrbances: none  Narrative Interpretation: unremarkable   nEGATIVE ct HEAD AND NEGATIVE ddimer.  No suspcision for ACS and in the setting of > 8 hours of pain with negative ekg and troponin ACS is excluded.  Follow up with neuro for ongoing care of your headaches         Lori Jensen Lori Karyss Frese-Rasch, MD 06/29/12 [phone]

## 2012-06-28 NOTE — ED Notes (Signed)
Pt seen 09/28 for same h/a ,  Here today for h/a, neck and shoulder pain x 1 week

## 2012-06-29 MED ORDER — NAPROXEN SODIUM 550 MG PO TABS: 550.0000 mg | ORAL_TABLET | Freq: Two times a day (BID) | ORAL | Status: DC

## 2012-11-01 IMAGING — CT CT HEAD W/O CM
2 series · 16 of 30 positions shown, 18 images · non-contrast
Comparison: 10/07/2005

CLINICAL DATA: Headache with dizziness.

CT HEAD WITHOUT CONTRAST
TECHNIQUE: Contiguous axial images were obtained from the base of
the skull through the vertex without contrast.

[Series 2: head 4.8 h37s · axial · 0.44mm/px · z∈[-179,-65]mm · 8 of 32 slices shown, 10 images]
[im 4/32  brain]
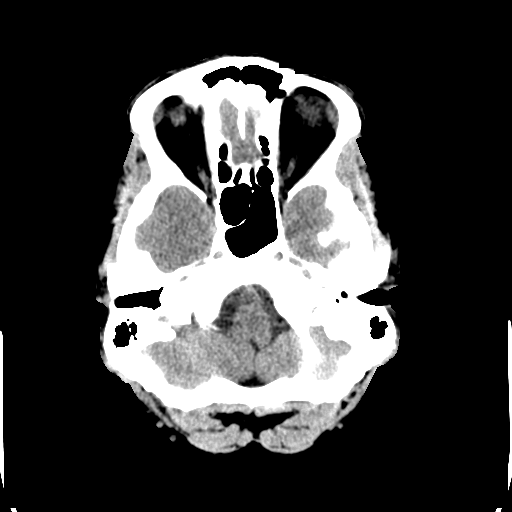
[im 4/32  bone]
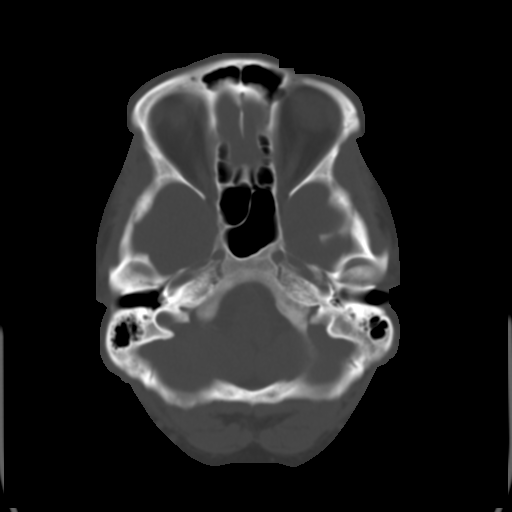
[im 7/32  brain]
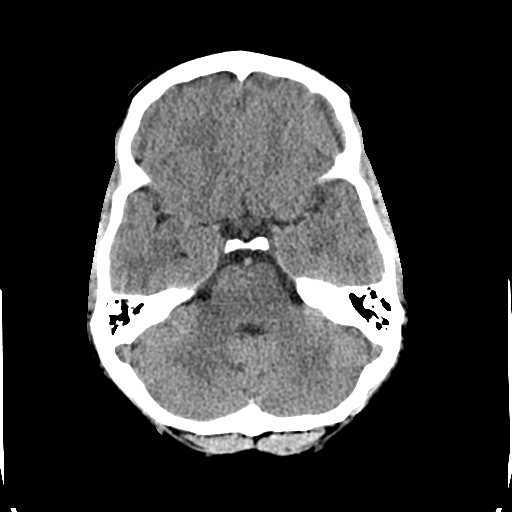
[im 11/32  brain]
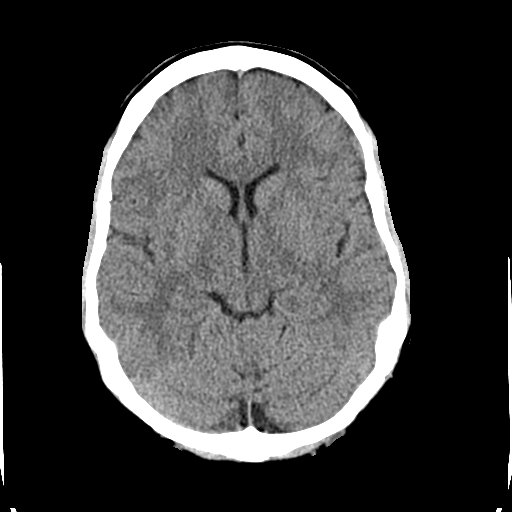
[im 14/32  brain]
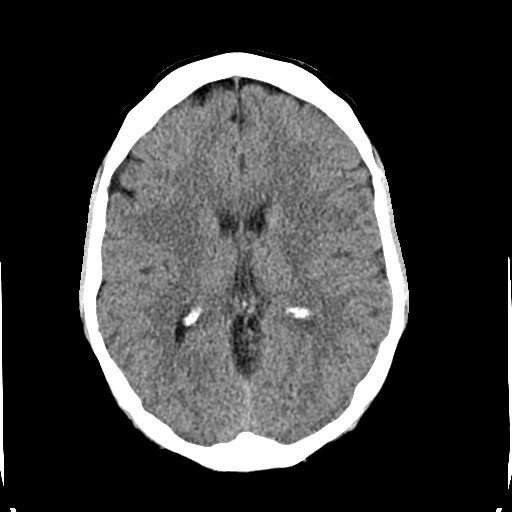
[im 18/32  brain]
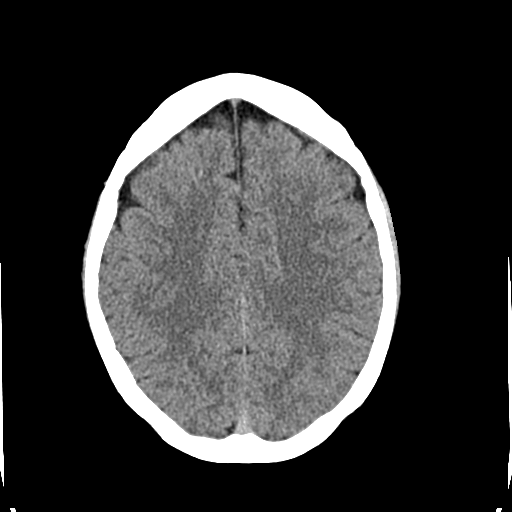
[im 18/32  bone]
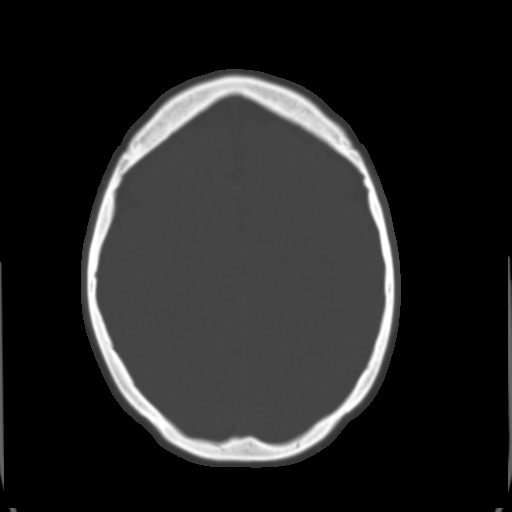
[im 21/32  brain]
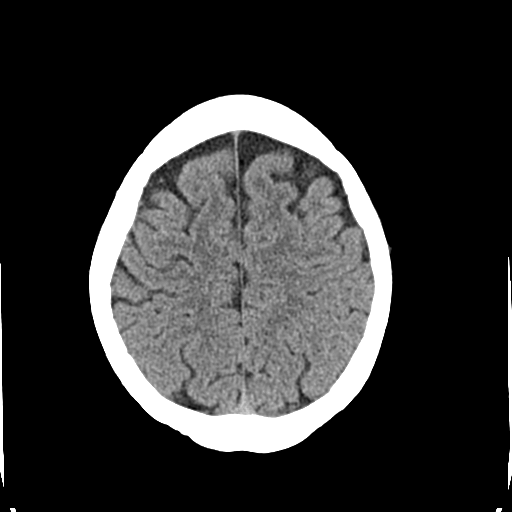
[im 25/32  brain]
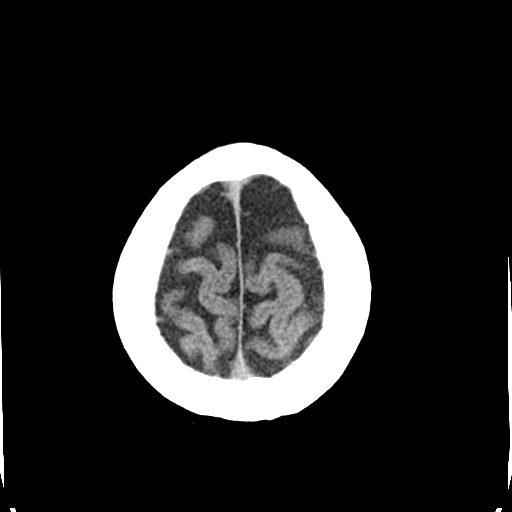
[im 28/32  brain]
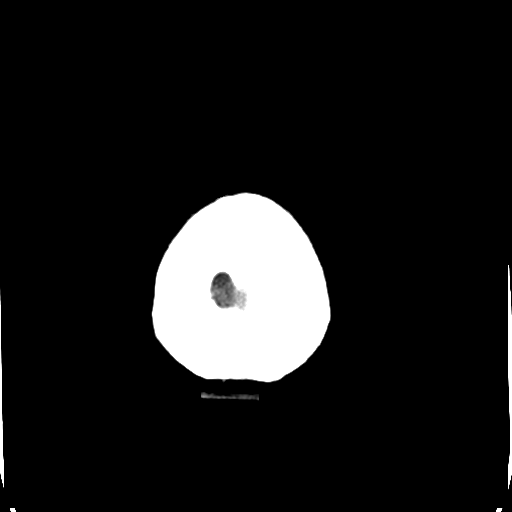

[Series 3: head 2.4 h60s bone · axial · 0.44mm/px · z∈[-180,-61]mm · 8 of 64 slices shown]
[im 7/64  bone]
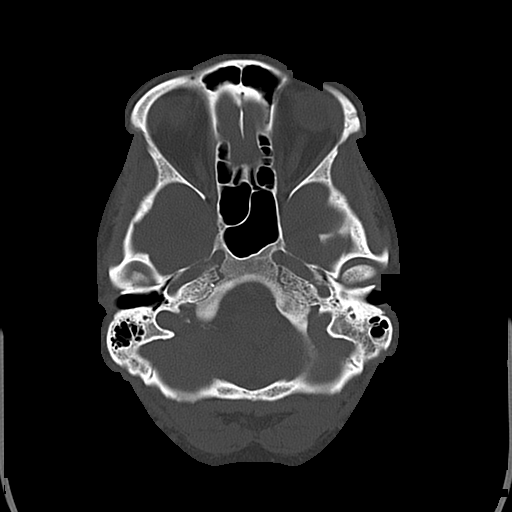
[im 14/64  bone]
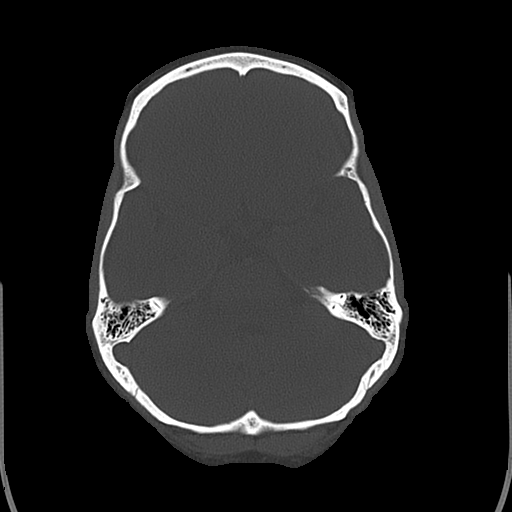
[im 20/64  bone]
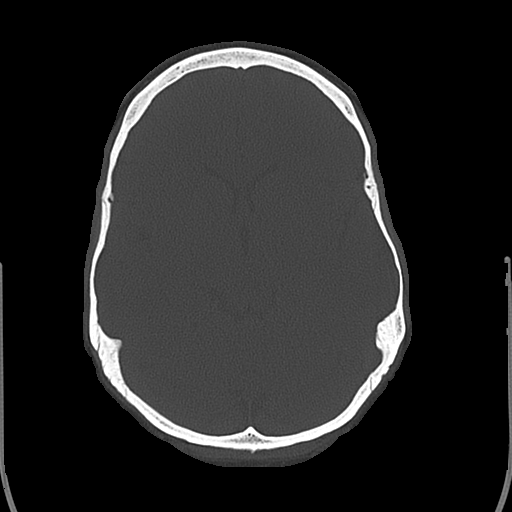
[im 27/64  bone]
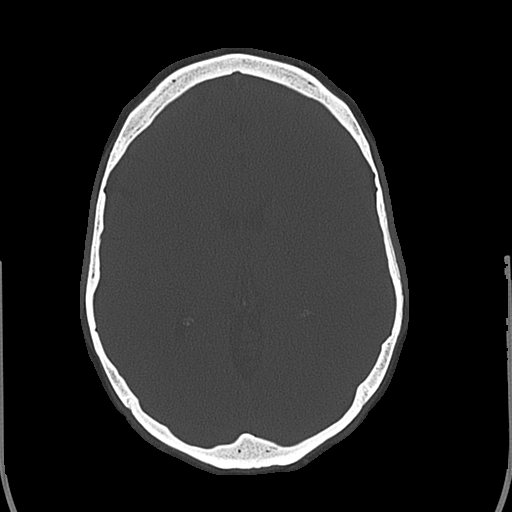
[im 37/64  bone]
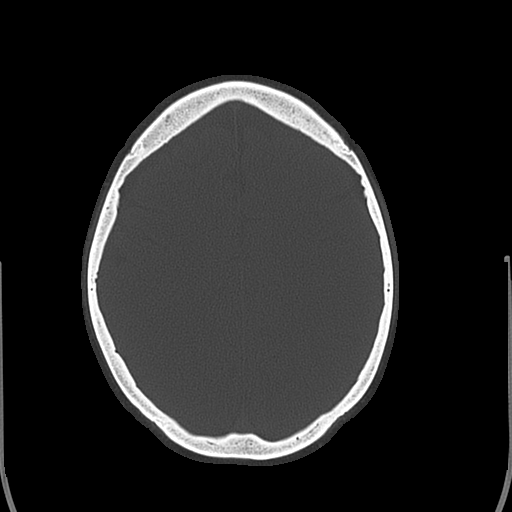
[im 44/64  bone]
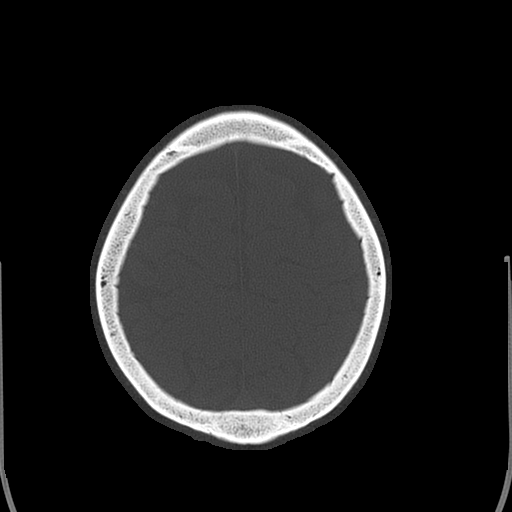
[im 50/64  bone]
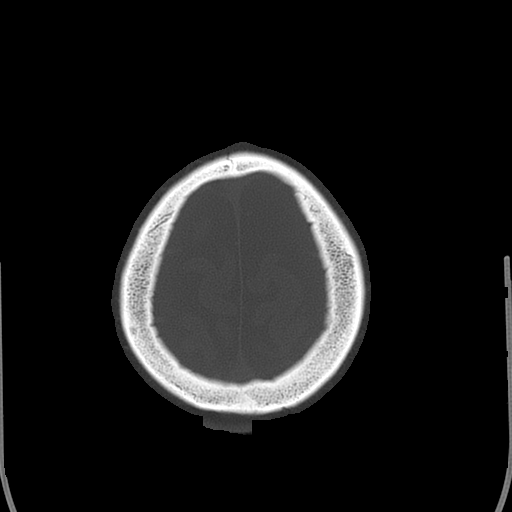
[im 57/64  bone]
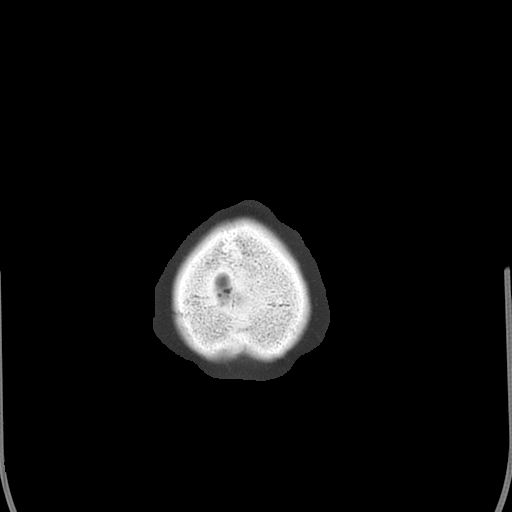

[16 of 30 positions shown; findings below may reference images not displayed]

FINDINGS: There is no evidence for acute hemorrhage, hydrocephalus,
mass lesion, or abnormal extra-axial fluid collection.  No definite
CT evidence for acute infarction.  The visualized paranasal sinuses
and mastoid air cells are clear.
IMPRESSION: Normal exam.

## 2012-12-20 ENCOUNTER — Inpatient Hospital Stay (HOSPITAL_COMMUNITY)
Admission: AD | Admit: 2012-12-20 | Discharge: 2012-12-20 | Disposition: A | Discharge status: Home / Self Care | Payer: Managed Care, Other (non HMO) | Source: Ambulatory Visit | Attending: Obstetrics & Gynecology | Admitting: Obstetrics & Gynecology

## 2012-12-20 ENCOUNTER — Encounter (HOSPITAL_COMMUNITY): Payer: Self-pay

## 2012-12-20 DIAGNOSIS — N949 Unspecified condition associated with female genital organs and menstrual cycle: Secondary | ICD-10-CM | POA: Insufficient documentation

## 2012-12-20 DIAGNOSIS — N760 Acute vaginitis: Secondary | ICD-10-CM | POA: Insufficient documentation

## 2012-12-20 DIAGNOSIS — R1031 Right lower quadrant pain: Secondary | ICD-10-CM

## 2012-12-20 DIAGNOSIS — A499 Bacterial infection, unspecified: Secondary | ICD-10-CM | POA: Insufficient documentation

## 2012-12-20 DIAGNOSIS — M549 Dorsalgia, unspecified: Secondary | ICD-10-CM | POA: Insufficient documentation

## 2012-12-20 DIAGNOSIS — B9689 Other specified bacterial agents as the cause of diseases classified elsewhere: Secondary | ICD-10-CM | POA: Insufficient documentation

## 2012-12-20 LAB — WET PREP, GENITAL: Yeast Wet Prep HPF POC: NONE SEEN

## 2012-12-20 LAB — URINALYSIS, ROUTINE W REFLEX MICROSCOPIC
Nitrite: NEGATIVE
Protein, ur: NEGATIVE mg/dL
Specific Gravity, Urine: <1.005 — ABNORMAL LOW (ref 1.005–1.030)
Urobilinogen, UA: 0.2 mg/dL (ref 0.0–1.0)

## 2012-12-20 LAB — CBC WITH DIFFERENTIAL/PLATELET
Basophils Absolute: 0 10*3/uL (ref 0.0–0.1)
Basophils Relative: 0 % (ref 0–1)
Eosinophils Relative: 3 % (ref 0–5)
Lymphocytes Relative: 35 % (ref 12–46)
MCHC: 35.2 g/dL (ref 30.0–36.0)
MCV: 84.2 fL (ref 78.0–100.0)
Monocytes Absolute: 0.4 10*3/uL (ref 0.1–1.0)
Neutro Abs: 4.2 10*3/uL (ref 1.7–7.7)
Platelets: 186 10*3/uL (ref 150–400)
RDW: 13.2 % (ref 11.5–15.5)
WBC: 7.4 10*3/uL (ref 4.0–10.5)

## 2012-12-20 LAB — POCT PREGNANCY, URINE: Preg Test, Ur: NEGATIVE

## 2012-12-20 MED ORDER — METRONIDAZOLE 500 MG PO TABS: 500.0000 mg | ORAL_TABLET | Freq: Two times a day (BID) | ORAL | Status: DC

## 2012-12-20 MED ORDER — IBUPROFEN 600 MG PO TABS: 600.0000 mg | ORAL_TABLET | Freq: Four times a day (QID) | ORAL | Status: DC | PRN

## 2012-12-20 MED ORDER — KETOROLAC TROMETHAMINE 60 MG/2ML IM SOLN
30.0000 mg | Freq: Once | INTRAMUSCULAR | Status: AC
  Administered 2012-12-20: 30 mg via INTRAMUSCULAR
  Filled 2012-12-20: qty 2

## 2012-12-20 NOTE — MAU Note (Signed)
Pt states has had uterine ablation, had uterine infection x2, now pain feels the same way. Abdomen is tender to touch, having low back pain, feels rectal pressure.

## 2012-12-20 NOTE — MAU Note (Signed)
Patient states she had an ablation over one year ago. Has been having lower abdominal pain that goes down both thighs and into the back and into the rectum for about one week. Denies bleeding  But has a normal heavy clear discharge for her.

## 2012-12-20 NOTE — MAU Provider Note (Signed)
History     CSN: [id number]  Arrival date and time: 12/20/12 1515   First Provider Initiated Contact with Patient 12/20/12 1800      Chief Complaint  Patient presents with  . Pelvic Pain  . Back Pain   HPI Lori Jensen is 42 y.o. [id number] Unknown weeks presenting with pelvic/rectal pressure X 2 days.   Cramping X 1 week and it has progressed to severe pain last night and today.  Denies vaginal bleeding or abnormal vaginal discharge.  Sexually active X 1 partner.  She had abnormal vaginal bleeding and h istory of endometrial ablation, with hospitalization X  3 days, several weeks after procedure for infection. She has had only 2 very light periods since.  SHe was seen at that time by [facility] but hasn't seen them in over a year.   She states the pain is similar to the pain she had then.  States she had ultrasound 1 year ago and may have shown fibroids.     Past Medical History  Diagnosis Date  . Hypertension   . Dysmenorrhea   . Anxiety   . Depression   . PONV (postoperative nausea and vomiting)     Past Surgical History  Procedure Laterality Date  . Dilation and curettage of uterus    . Uterine ablation  08/13/2011    History reviewed. No pertinent family history.  History  Substance Use Topics  . Smoking status: Current Every Day Smoker -- 0.25 packs/day for 20 years    Types: Cigarettes  . Smokeless tobacco: Never Used  . Alcohol Use: 0.6 oz/week    1 Glasses of wine per week     Comment: occasionally    Allergies:  Allergies  Allergen Reactions  . Codeine Anaphylaxis  . Doxycycline Rash    Prescriptions prior to admission  Medication Sig Dispense Refill  . ALPRAZolam (XANAX) 0.25 MG tablet Take 0.25 mg by mouth daily as needed. For anxiety      . ibuprofen (ADVIL,MOTRIN) 800 MG tablet Take 800 mg by mouth every 8 (eight) hours as needed for pain.        Review of Systems  Constitutional: Negative for fever and chills.  Gastrointestinal:  Positive for abdominal pain. Negative for nausea, vomiting, diarrhea and constipation.  Genitourinary:       Neg for vaginal discharge or bleeding  Neurological: Negative for headaches.   Physical Exam   Blood pressure 145/87, pulse 64, temperature 98.2 F (36.8 C), temperature source Oral, resp. rate 16, height 5' 6.5" (1.689 m), weight 172 lb (78.019 kg), SpO2 100.00%.  Physical Exam  Constitutional: She is oriented to person, place, and time. She appears well-developed and well-nourished. No distress.  HENT:  Head: Normocephalic.  Neck: Normal range of motion.  Cardiovascular: Normal rate.   Respiratory: Effort normal.  GI: Soft. She exhibits no distension and no mass. There is no tenderness. There is no rebound and no guarding.  Genitourinary: There is no rash, tenderness or lesion on the right labia. There is no rash, tenderness or lesion on the left labia. Uterus is tender (mild). Uterus is not enlarged. Cervix exhibits no discharge. Right adnexum displays tenderness (mild). Right adnexum displays no mass. Left adnexum displays no mass, no tenderness and no fullness. No bleeding around the vagina. No vaginal discharge found.  Neurological: She is alert and oriented to person, place, and time.  Skin: Skin is warm and dry.  Psychiatric: She has a normal mood and affect.  Her behavior is normal.   Results for orders placed during the hospital encounter of 12/20/12 (from the past 24 hour(s))  URINALYSIS, ROUTINE W REFLEX MICROSCOPIC     Status: Abnormal   Collection Time    12/20/12  3:10 PM      Result Value Range   Color, Urine YELLOW  YELLOW   APPearance CLEAR  CLEAR   Specific Gravity, Urine <1.005 (*) 1.005 - 1.030   pH 6.5  5.0 - 8.0   Glucose, UA NEGATIVE  NEGATIVE mg/dL   Hgb urine dipstick NEGATIVE  NEGATIVE   Bilirubin Urine NEGATIVE  NEGATIVE   Ketones, ur NEGATIVE  NEGATIVE mg/dL   Protein, ur NEGATIVE  NEGATIVE mg/dL   Urobilinogen, UA 0.2  0.0 - 1.0 mg/dL    Nitrite NEGATIVE  NEGATIVE   Leukocytes, UA NEGATIVE  NEGATIVE  POCT PREGNANCY, URINE     Status: None   Collection Time    12/20/12  4:21 PM      Result Value Range   Preg Test, Ur NEGATIVE  NEGATIVE  WET PREP, GENITAL     Status: Abnormal   Collection Time    12/20/12  6:10 PM      Result Value Range   Yeast Wet Prep HPF POC NONE SEEN  NONE SEEN   Trich, Wet Prep NONE SEEN  NONE SEEN   Clue Cells Wet Prep HPF POC MANY (*) NONE SEEN   WBC, Wet Prep HPF POC FEW (*) NONE SEEN  CBC WITH DIFFERENTIAL     Status: None   Collection Time    12/20/12  6:33 PM      Result Value Range   WBC 7.4  4.0 - 10.5 K/uL   RBC 4.69  3.87 - 5.11 MIL/uL   Hemoglobin 13.9  12.0 - 15.0 g/dL   HCT 16.1  09.6 - 04.5 %   MCV 84.2  78.0 - 100.0 fL   MCH 29.6  26.0 - 34.0 pg   MCHC 35.2  30.0 - 36.0 g/dL   RDW 40.9  81.1 - 91.4 %   Platelets 186  150 - 400 K/uL   Neutrophils Relative 57  43 - 77 %   Neutro Abs 4.2  1.7 - 7.7 K/uL   Lymphocytes Relative 35  12 - 46 %   Lymphs Abs 2.6  0.7 - 4.0 K/uL   Monocytes Relative 5  3 - 12 %   Monocytes Absolute 0.4  0.1 - 1.0 K/uL   Eosinophils Relative 3  0 - 5 %   Eosinophils Absolute 0.2  0.0 - 0.7 K/uL   Basophils Relative 0  0 - 1 %   Basophils Absolute 0.0  0.0 - 0.1 K/uL   MAU Course  Procedures  Gc/CHl culture to lab  MDM Patient took 800mg  of Ibuprofen at noon, Toradol 30mg  Im given for pain.  Assessment and Plan  A:  Right pelvic pain     Bacterial vaginosis  P:  Rx for Flagyl 500mg  1 po bid X 1 week and     Rx for Ibuprofen 800mg  to pharmacy     Tylenol prn alternating with Ibuprofen prn pain      It is the patient's wish to follow up with Dr. Tenny Craw, she will call to make appointment  KEY,EVE M 12/20/2012, 6:02 PM

## 2012-12-29 ENCOUNTER — Encounter: Payer: Self-pay | Admitting: Gastroenterology

## 2013-01-08 ENCOUNTER — Ambulatory Visit (INDEPENDENT_AMBULATORY_CARE_PROVIDER_SITE_OTHER): Payer: Managed Care, Other (non HMO) | Admitting: Gastroenterology

## 2013-01-08 ENCOUNTER — Other Ambulatory Visit (INDEPENDENT_AMBULATORY_CARE_PROVIDER_SITE_OTHER): Payer: Managed Care, Other (non HMO)

## 2013-01-08 ENCOUNTER — Encounter: Payer: Self-pay | Admitting: Gastroenterology

## 2013-01-08 VITALS — BP 130/90 | HR 64 | Ht 66.5 in | Wt 174.4 lb

## 2013-01-08 DIAGNOSIS — R109 Unspecified abdominal pain: Secondary | ICD-10-CM

## 2013-01-08 DIAGNOSIS — M545 Low back pain, unspecified: Secondary | ICD-10-CM

## 2013-01-08 DIAGNOSIS — K921 Melena: Secondary | ICD-10-CM

## 2013-01-08 LAB — COMPREHENSIVE METABOLIC PANEL
ALT: 12 U/L (ref 0–35)
Albumin: 3.9 g/dL (ref 3.5–5.2)
CO2: 28 mEq/L (ref 19–32)
Calcium: 8.5 mg/dL (ref 8.4–10.5)
Chloride: 105 mEq/L (ref 96–112)
GFR: 104.95 mL/min (ref >60.00)
Glucose, Bld: 96 mg/dL (ref 70–99)
Sodium: 138 mEq/L (ref 135–145)
Total Bilirubin: 0.5 mg/dL (ref 0.3–1.2)
Total Protein: 6.5 g/dL (ref 6.0–8.3)

## 2013-01-08 LAB — LIPASE: Lipase: 17 U/L (ref 11.0–59.0)

## 2013-01-08 MED ORDER — HYOSCYAMINE SULFATE 0.125 MG SL SUBL: 0.1250 mg | SUBLINGUAL_TABLET | SUBLINGUAL | Status: DC | PRN

## 2013-01-08 MED ORDER — OMEPRAZOLE 40 MG PO CPDR: 40.0000 mg | DELAYED_RELEASE_CAPSULE | Freq: Every day | ORAL | Status: DC

## 2013-01-08 MED ORDER — PEG-KCL-NACL-NASULF-NA ASC-C 100 G PO SOLR: 1.0000 | Freq: Once | ORAL | Status: DC

## 2013-01-08 NOTE — Progress Notes (Signed)
History of Present Illness: This is a 42 year old female with a three-week history of lower abdominal pain, back pain and rectal pain brought on by bowel movements and meals. She describes her abdominal pain there is more severe in the right side relative to the left in her back pain is almost always in her left lumbar and thoracic region. In addition she notes increased frequency of stools now occurring once or twice daily and significant abdominal bloating. She has a history of intermittent small amounts of bright blood per rectum for years that she has attributed to hemorrhoids and this pattern has not changed. She was seen in the emergency department at [facility] and prescribed Advil for her pain and Flagyl for bacterial vaginosis. Patient states she took Flagyl for one day lead to significant nausea so she has not completed the treatment. Denies weight loss, constipation, diarrhea, change in stool caliber, melena, hematochezia, vomiting, dysphagia, reflux symptoms, chest pain.  Review of Systems: Pertinent positive and negative review of systems were noted in the above HPI section. All other review of systems were otherwise negative.  Current Medications, Allergies, Past Medical History, Past Surgical History, Family History and Social History were reviewed in [employer] record.  Physical Exam: General: Well developed , well nourished, no acute distress Head: Normocephalic and atraumatic Eyes:  sclerae anicteric, EOMI Ears: Normal auditory acuity Mouth: No deformity or lesions Neck: Supple, no masses or thyromegaly Lungs: Clear throughout to auscultation Heart: Regular rate and rhythm; no murmurs, rubs or bruits Abdomen: Soft, RLQ>LLQ tenderness without rebound or guarding and non distended. No masses, hepatosplenomegaly or hernias noted. Normal Bowel sounds Rectal: moderate external hemorrhoids, no tenderness, no lesions, Hemoccult negative brown stool the  vault Musculoskeletal: Symmetrical with no gross deformities  Skin: No lesions on visible extremities Pulses:  Normal pulses noted Extremities: No clubbing, cyanosis, edema or deformities noted Neurological: Alert oriented x 4, grossly nonfocal Cervical Nodes:  No significant cervical adenopathy Inguinal Nodes: No significant inguinal adenopathy Psychological:  Alert and cooperative. Normal mood and affect  Assessment and Recommendations:  1. Lower abd pain, left back pain, rectal pain, nausea, hematochezia, change in bowel pattern. R/O IBD, colorectal neoplasms, GYN disorders. CMET and lipase today. CT abd/pelvis. The risks, benefits, and alternatives to colonoscopy with possible biopsy and possible polypectomy were discussed with the patient and they consent to proceed. The risks, benefits, and alternatives to endoscopy with possible biopsy and possible dilation were discussed with the patient and they consent to proceed. Start omeprazole 20 mg daily and hyoscyamine 1-2 every 4 hours when necessary. She recommended that she obtain followup with her gynecologist.

## 2013-01-08 NOTE — Patient Instructions (Addendum)
You have been given a separate informational sheet regarding your tobacco use, the importance of quitting and local resources to help you quit.  Your physician has requested that you go to the basement for the following lab work before leaving today:cmet, lipase.  We have sent the following medications to your pharmacy for you to pick up at your convenience: Hyoscamine, omeprazole.  You have been scheduled for an endoscopy and colonoscopy with propofol. Please follow the written instructions given to you at your visit today. Please pick up your prep at the pharmacy within the next 1-3 days. If you use inhalers (even only as needed), please bring them with you on the day of your procedure.    You have been scheduled for a CT scan of the abdomen and pelvis at [facility] ([address]---this is in the same building as Architectural technologist).   You are scheduled on 01/11/13 at 9:00am. You should arrive 15 minutes prior to your appointment time for registration. Please follow the written instructions below on the day of your exam:  WARNING: IF YOU ARE ALLERGIC TO IODINE/X-RAY DYE, PLEASE NOTIFY RADIOLOGY IMMEDIATELY AT [phone]! YOU WILL BE GIVEN A 13 HOUR PREMEDICATION PREP.  1) Do not eat or drink anything after 5:00am (4 hours prior to your test) 2) You have been given 2 bottles of oral contrast to drink. The solution may taste better if refrigerated, but do NOT add ice or any other liquid to this solution. Shake well before drinking.    Drink 1 bottle of contrast @ 7:00am (2 hours prior to your exam)  Drink 1 bottle of contrast @ 8:00am (1 hour prior to your exam)  You may take any medications as prescribed with a small amount of water except for the following: Metformin, Glucophage, Glucovance, Avandamet, Riomet, Fortamet, Actoplus Met, Janumet, Glumetza or Metaglip. The above medications must be held the day of the exam AND 48 hours after the exam.  The purpose of you  drinking the oral contrast is to aid in the visualization of your intestinal tract. The contrast solution may cause some diarrhea. Before your exam is started, you will be given a small amount of fluid to drink. Depending on your individual set of symptoms, you may also receive an intravenous injection of x-ray contrast/dye. Plan on being at [facility] for 30 minutes or long, depending on the type of exam you are having performed.  This test typically takes 30-45 minutes to complete.  If you have any questions regarding your exam or if you need to reschedule, you may call the CT department at [phone] between the hours of 8:00 am and 5:00 pm, Monday-Friday.  ________________________________________________________________________

## 2013-01-09 ENCOUNTER — Encounter: Payer: Self-pay | Admitting: Gastroenterology

## 2013-01-09 ENCOUNTER — Inpatient Hospital Stay (HOSPITAL_COMMUNITY)
Admission: AD | Admit: 2013-01-09 | Discharge: 2013-01-09 | Disposition: A | Discharge status: Home / Self Care | Payer: Managed Care, Other (non HMO) | Source: Ambulatory Visit | Attending: Obstetrics and Gynecology | Admitting: Obstetrics and Gynecology

## 2013-01-09 ENCOUNTER — Encounter (HOSPITAL_COMMUNITY): Payer: Self-pay | Admitting: *Deleted

## 2013-01-09 ENCOUNTER — Inpatient Hospital Stay (HOSPITAL_COMMUNITY): Payer: Managed Care, Other (non HMO)

## 2013-01-09 ENCOUNTER — Telehealth: Payer: Self-pay | Admitting: Gastroenterology

## 2013-01-09 DIAGNOSIS — R1031 Right lower quadrant pain: Secondary | ICD-10-CM | POA: Insufficient documentation

## 2013-01-09 DIAGNOSIS — N946 Dysmenorrhea, unspecified: Secondary | ICD-10-CM | POA: Insufficient documentation

## 2013-01-09 DIAGNOSIS — N83209 Unspecified ovarian cyst, unspecified side: Secondary | ICD-10-CM

## 2013-01-09 DIAGNOSIS — I10 Essential (primary) hypertension: Secondary | ICD-10-CM | POA: Insufficient documentation

## 2013-01-09 DIAGNOSIS — N9489 Other specified conditions associated with female genital organs and menstrual cycle: Secondary | ICD-10-CM | POA: Insufficient documentation

## 2013-01-09 LAB — URINALYSIS, ROUTINE W REFLEX MICROSCOPIC
Hgb urine dipstick: NEGATIVE
Nitrite: NEGATIVE
Protein, ur: NEGATIVE mg/dL
Specific Gravity, Urine: 1.02 (ref 1.005–1.030)
Urobilinogen, UA: 0.2 mg/dL (ref 0.0–1.0)

## 2013-01-09 LAB — CBC WITH DIFFERENTIAL/PLATELET
Basophils Absolute: 0 10*3/uL (ref 0.0–0.1)
Basophils Relative: 0 % (ref 0–1)
Eosinophils Absolute: 0.3 10*3/uL (ref 0.0–0.7)
MCH: 29.3 pg (ref 26.0–34.0)
MCHC: 34.6 g/dL (ref 30.0–36.0)
Monocytes Relative: 8 % (ref 3–12)
Neutro Abs: 3.8 10*3/uL (ref 1.7–7.7)
Neutrophils Relative %: 47 % (ref 43–77)
Platelets: 203 10*3/uL (ref 150–400)
RDW: 13.5 % (ref 11.5–15.5)

## 2013-01-09 LAB — POCT PREGNANCY, URINE: Preg Test, Ur: NEGATIVE

## 2013-01-09 MED ORDER — KETOROLAC TROMETHAMINE 60 MG/2ML IM SOLN
60.0000 mg | Freq: Once | INTRAMUSCULAR | Status: AC
  Administered 2013-01-09: 60 mg via INTRAMUSCULAR
  Filled 2013-01-09: qty 2

## 2013-01-09 MED ORDER — KETOROLAC TROMETHAMINE 10 MG PO TABS: 10.0000 mg | ORAL_TABLET | Freq: Four times a day (QID) | ORAL | Status: DC | PRN

## 2013-01-09 NOTE — MAU Note (Signed)
Sharp abdominal pain and cramping in the right lower abdomen for the last few weeks. Loose stools and constipation for the last two weeks. Went to see a GI doctor yesterday. Scheduled for a CT, endoscopy and colonoscopy.

## 2013-01-09 NOTE — Telephone Encounter (Signed)
Patient states that she has taken the hyoscyamine and her pain has improved.  She wanted her CT scan moved up.  I have helped her reschedule for Thursday at 8:30.  She verbalizes understanding of all instructions.  She will call back for worsening pain or new symptoms

## 2013-01-09 NOTE — MAU Provider Note (Signed)
Chief Complaint: Abdominal Pain  First Provider Initiated Contact with Patient 01/09/13 2106     SUBJECTIVE HPI: Lori Jensen is a 42 y.o. [phone] non-pregnant female who presents with right and mid low abd pain, 10/10 at worst and alternating loose stools and feeling constipated x 2-3 weeks. Concerned because pain is similar to pain that she had after complications from Endometrial Ablation in 2012. Developed infection. Has only had two episodes of menstrual spotting since then.   Seen in MAU 12/20/12. Tx BV. GC/CT neg. Clinical Dx ovarian cyst. No imaging. Has appt scheduled w/ Dr. Donovan Kail in 1 week. Had appt w/ [facility] yesterday. Scheduled for a CT 01/11/13, endoscopy and colonoscopy.   Past Medical History  Diagnosis Date  . Hypertension   . Dysmenorrhea   . Anxiety   . Depression   . PONV (postoperative nausea and vomiting)    OB History   Grav Para Term Preterm Abortions TAB SAB Ect Mult Living   7 2 2  5 2 3   2      # Outc Date GA Lbr Len/2nd Wgt Sex Del Anes PTL Lv   1 TAB            2 TAB            3 SAB            4 SAB            5 SAB            6 TRM            7 TRM              Past Surgical History  Procedure Laterality Date  . Dilation and curettage of uterus    . Uterine ablation  08/13/2011   History   Social History  . Marital Status: Divorced    Spouse Name: N/A    Number of Children: 2  . Years of Education: N/A   Occupational History  . MORTGAGE COLLECTOR    Social History Main Topics  . Smoking status: Current Every Day Smoker -- 0.25 packs/day for 20 years    Types: Cigarettes  . Smokeless tobacco: Never Used  . Alcohol Use: 0.6 oz/week    1 Glasses of wine per week     Comment: occasionally  . Drug Use: No  . Sexually Active: Yes    Birth Control/ Protection: None   Other Topics Concern  . Not on file   Social History Narrative  . No narrative on file   Current Facility-Administered Medications on File Prior to  Encounter  Medication Dose Route Frequency Provider Last Rate Last Dose  . lactated ringers with KCl 20 mEq/L infusion   Intravenous Continuous Miguel Aschoff, MD       Current Outpatient Prescriptions on File Prior to Encounter  Medication Sig Dispense Refill  . hyoscyamine (LEVSIN SL) 0.125 MG SL tablet Place 1 tablet (0.125 mg total) under the tongue every 4 (four) hours as needed for cramping.  60 tablet  11  . ibuprofen (ADVIL,MOTRIN) 600 MG tablet Take 1 tablet (600 mg total) by mouth every 6 (six) hours as needed for pain.  30 tablet  0  . omeprazole (PRILOSEC) 40 MG capsule Take 1 capsule (40 mg total) by mouth daily.  30 capsule  11  . ALPRAZolam (XANAX) 0.25 MG tablet Take 0.25 mg by mouth daily as needed. For anxiety      .  peg 3350 powder (MOVIPREP) 100 G SOLR Take 1 kit (100 g total) by mouth once.  1 kit  0   Allergies  Allergen Reactions  . Codeine Anaphylaxis  . Doxycycline Rash    ROS: Pos for abd cramping, pelvic pressure, rare nausea, scant vaginal spotting. Neg for fever, chills, loss of appetite, vomiting, bloody stools, tarry stools, vaginal discharge.  OBJECTIVE Blood pressure 156/92, pulse 77, temperature 98.4 F (36.9 C), temperature source Oral, resp. rate 20. GENERAL: Well-developed, well-nourished female in moderate distress.  HEENT: Normocephalic HEART: normal rate RESP: normal effort ABDOMEN: Soft, mild RLQ and SP tenderness. Pos BS x 4. Neg rebound, guarding or mass. EXTREMITIES: Nontender, no edema NEURO: Alert and oriented SPECULUM EXAM: NEFG, physiologic discharge, no blood noted, cervix clean BIMANUAL: cervix closed; uterus ? Slightly enlarged. No adnexal tenderness or masses. No CMT.   LAB RESULTS Results for orders placed during the hospital encounter of 01/09/13 (from the past 24 hour(s))  URINALYSIS, ROUTINE W REFLEX MICROSCOPIC     Status: None   Collection Time    01/09/13  8:35 PM      Result Value Range   Color, Urine YELLOW  YELLOW    APPearance CLEAR  CLEAR   Specific Gravity, Urine 1.020  1.005 - 1.030   pH 6.0  5.0 - 8.0   Glucose, UA NEGATIVE  NEGATIVE mg/dL   Hgb urine dipstick NEGATIVE  NEGATIVE   Bilirubin Urine NEGATIVE  NEGATIVE   Ketones, ur NEGATIVE  NEGATIVE mg/dL   Protein, ur NEGATIVE  NEGATIVE mg/dL   Urobilinogen, UA 0.2  0.0 - 1.0 mg/dL   Nitrite NEGATIVE  NEGATIVE   Leukocytes, UA NEGATIVE  NEGATIVE  POCT PREGNANCY, URINE     Status: None   Collection Time    01/09/13  8:52 PM      Result Value Range   Preg Test, Ur NEGATIVE  NEGATIVE  CBC WITH DIFFERENTIAL     Status: None   Collection Time    01/09/13  9:00 PM      Result Value Range   WBC 8.1  4.0 - 10.5 K/uL   RBC 4.67  3.87 - 5.11 MIL/uL   Hemoglobin 13.7  12.0 - 15.0 g/dL   HCT 16.1  09.6 - 04.5 %   MCV 84.8  78.0 - 100.0 fL   MCH 29.3  26.0 - 34.0 pg   MCHC 34.6  30.0 - 36.0 g/dL   RDW 40.9  81.1 - 91.4 %   Platelets 203  150 - 400 K/uL   Neutrophils Relative 47  43 - 77 %   Neutro Abs 3.8  1.7 - 7.7 K/uL   Lymphocytes Relative 41  12 - 46 %   Lymphs Abs 3.3  0.7 - 4.0 K/uL   Monocytes Relative 8  3 - 12 %   Monocytes Absolute 0.6  0.1 - 1.0 K/uL   Eosinophils Relative 4  0 - 5 %   Eosinophils Absolute 0.3  0.0 - 0.7 K/uL   Basophils Relative 0  0 - 1 %   Basophils Absolute 0.0  0.0 - 0.1 K/uL    IMAGING US Transvaginal Non-ob  01/09/2013  *RADIOLOGY REPORT*  Clinical Data: Right lower quadrant abdominal pain.  TRANSABDOMINAL AND TRANSVAGINAL ULTRASOUND OF PELVIS Technique:  Both transabdominal and transvaginal ultrasound examinations of the pelvis were performed. Transabdominal technique was performed for global imaging of the pelvis including uterus, ovaries, adnexal regions, and pelvic cul-de-sac.  It was necessary to  proceed with endovaginal exam following the transabdominal exam to visualize the uterus, endometrium, and ovaries.  Comparison:  CT scan dated 08/28/2011  Findings:  Uterus: 9.4 x 6.3 x 0.6 cm.  There are  multiple fibroids, the largest being 2 cm in diameter.  There are two cystic structures at the uterine fundus.  One of these is present on the prior CT scan.  Endometrium: There are multiple unusual mass-like hyperechoic areas in the endometrial cavity.  There is no perfusion to these areas. These may represent loculated areas of hemorrhage.  The patient may have synechia in the endometrial cavity creating loculated blood collections.  Right ovary:  4.8 x 1.7 x 3.1 cm.  Small hemorrhagic cyst.  Left ovary: 2.2 x 1.1 x 2.1 cm.  Small simple cyst.  Other findings: No free fluid  IMPRESSION: Abnormal appearance of the endometrial cavity with a hyper echoic loculated areas suggesting a loculated blood.  The cystic areas adjacent to the uterine fundus are indeterminate. One of these is present on the prior CT scan.   Original Report Authenticated By: Francene Boyers, M.D.    US Pelvis Complete  01/09/2013  *RADIOLOGY REPORT*  Clinical Data: Right lower quadrant abdominal pain.  TRANSABDOMINAL AND TRANSVAGINAL ULTRASOUND OF PELVIS Technique:  Both transabdominal and transvaginal ultrasound examinations of the pelvis were performed. Transabdominal technique was performed for global imaging of the pelvis including uterus, ovaries, adnexal regions, and pelvic cul-de-sac.  It was necessary to proceed with endovaginal exam following the transabdominal exam to visualize the uterus, endometrium, and ovaries.  Comparison:  CT scan dated 08/28/2011  Findings:  Uterus: 9.4 x 6.3 x 0.6 cm.  There are multiple fibroids, the largest being 2 cm in diameter.  There are two cystic structures at the uterine fundus.  One of these is present on the prior CT scan.  Endometrium: There are multiple unusual mass-like hyperechoic areas in the endometrial cavity.  There is no perfusion to these areas. These may represent loculated areas of hemorrhage.  The patient may have synechia in the endometrial cavity creating loculated blood collections.   Right ovary:  4.8 x 1.7 x 3.1 cm.  Small hemorrhagic cyst.  Left ovary: 2.2 x 1.1 x 2.1 cm.  Small simple cyst.  Other findings: No free fluid  IMPRESSION: Abnormal appearance of the endometrial cavity with a hyper echoic loculated areas suggesting a loculated blood.  The cystic areas adjacent to the uterine fundus are indeterminate. One of these is present on the prior CT scan.   Original Report Authenticated By: Francene Boyers, M.D.     MAU COURSE Pain decreased to 3 w/ toradol. Notified Dr. Tenny Craw of exam, [place] finding and scheduled GI testing. States hemorrhagic cyst and/or loculated blood collections may be causing pain. No new orders. Agrees w/ plan to F/U w/ GI and his office.   ASSESSMENT 1. Hemorrhagic ovarian cyst   2. Endometrial mass--possible loculated blood collections.   PLAN Discharge home. Comfort measures.  Instructed to not take Toradol w/in 6 hours of Ibuprofen Follow-up Information   Follow up with [facility], [place] On 01/17/2013.   Contact information:   [address] [place] [address] [phone]       Follow up with [facility]. (As needed if symptoms worsen)    Contact information:   [address] [place] [address]        Medication List    TAKE these medications       ALPRAZolam 0.25 MG  tablet  Commonly known as:  XANAX  Take 0.25 mg by mouth daily as needed. For anxiety     hyoscyamine 0.125 MG SL tablet  Commonly known as:  LEVSIN SL  Place 1 tablet (0.125 mg total) under the tongue every 4 (four) hours as needed for cramping.     ibuprofen 600 MG tablet  Commonly known as:  ADVIL,MOTRIN  Take 1 tablet (600 mg total) by mouth every 6 (six) hours as needed for pain.     ketorolac 10 MG tablet  Commonly known as:  TORADOL  Take 1 tablet (10 mg total) by mouth every 6 (six) hours as needed for pain.     omeprazole 40 MG capsule  Commonly known as:  PRILOSEC  Take 1 capsule (40 mg total) by mouth daily.     peg 3350  powder 100 G Solr  Commonly known as:  MOVIPREP  Take 1 kit (100 g total) by mouth once.       [place], CNM 01/09/2013  11:01 PM

## 2013-01-11 ENCOUNTER — Telehealth: Payer: Self-pay | Admitting: Gastroenterology

## 2013-01-11 ENCOUNTER — Ambulatory Visit (INDEPENDENT_AMBULATORY_CARE_PROVIDER_SITE_OTHER)
Admission: RE | Admit: 2013-01-11 | Discharge: 2013-01-11 | Disposition: A | Discharge status: Home / Self Care | Payer: Managed Care, Other (non HMO) | Source: Ambulatory Visit | Attending: Gastroenterology | Admitting: Gastroenterology

## 2013-01-11 DIAGNOSIS — R109 Unspecified abdominal pain: Secondary | ICD-10-CM

## 2013-01-11 DIAGNOSIS — K921 Melena: Secondary | ICD-10-CM

## 2013-01-11 MED ORDER — IOHEXOL 300 MG/ML  SOLN
100.0000 mL | Freq: Once | INTRAMUSCULAR | Status: AC | PRN
  Administered 2013-01-11: 100 mL via INTRAVENOUS

## 2013-01-11 NOTE — Telephone Encounter (Signed)
She needs a colonoscopy at some point for rectal bleeding, rectal pain and change in bowel habits. She can reschedule the colonoscopy for another time after evaluation by Dr. Tenny Craw.

## 2013-01-11 NOTE — Telephone Encounter (Signed)
Spoke with the patient.  Does she need the colonoscopy on Monday?  She is having "terrible abdominal pain " since starting her Period two days ago.  She is trying to get an appt with her GYN Dr. Tenny Craw today.  They gave her Toradol in the ER but it is not helping the pain.  Should she have GYN eval prior to colonoscopy?

## 2013-01-11 NOTE — Telephone Encounter (Signed)
I have cancelled the procedures for Monday.  The patient was in Dr. Charlott Rakes office and she states that she will call me back to reschedule at a later date

## 2013-01-12 ENCOUNTER — Encounter (HOSPITAL_COMMUNITY): Payer: Self-pay | Admitting: Family

## 2013-01-12 ENCOUNTER — Inpatient Hospital Stay (HOSPITAL_COMMUNITY)
Admission: AD | Admit: 2013-01-12 | Discharge: 2013-01-17 | DRG: 761 | Disposition: A | Discharge status: Home / Self Care | Payer: Managed Care, Other (non HMO) | Source: Ambulatory Visit | Attending: Obstetrics and Gynecology | Admitting: Obstetrics and Gynecology

## 2013-01-12 ENCOUNTER — Other Ambulatory Visit: Payer: Managed Care, Other (non HMO)

## 2013-01-12 DIAGNOSIS — R109 Unspecified abdominal pain: Secondary | ICD-10-CM | POA: Diagnosis present

## 2013-01-12 DIAGNOSIS — N8501 Benign endometrial hyperplasia: Secondary | ICD-10-CM | POA: Diagnosis present

## 2013-01-12 DIAGNOSIS — R102 Pelvic and perineal pain: Secondary | ICD-10-CM

## 2013-01-12 DIAGNOSIS — N9489 Other specified conditions associated with female genital organs and menstrual cycle: Secondary | ICD-10-CM

## 2013-01-12 DIAGNOSIS — I10 Essential (primary) hypertension: Secondary | ICD-10-CM | POA: Diagnosis present

## 2013-01-12 DIAGNOSIS — N83209 Unspecified ovarian cyst, unspecified side: Secondary | ICD-10-CM | POA: Diagnosis present

## 2013-01-12 DIAGNOSIS — R509 Fever, unspecified: Secondary | ICD-10-CM | POA: Diagnosis present

## 2013-01-12 DIAGNOSIS — N857 Hematometra: Principal | ICD-10-CM | POA: Diagnosis present

## 2013-01-12 DIAGNOSIS — N946 Dysmenorrhea, unspecified: Secondary | ICD-10-CM | POA: Diagnosis present

## 2013-01-12 DIAGNOSIS — N7011 Chronic salpingitis: Secondary | ICD-10-CM

## 2013-01-12 DIAGNOSIS — N8000 Endometriosis of the uterus, unspecified: Secondary | ICD-10-CM | POA: Diagnosis present

## 2013-01-12 DIAGNOSIS — N949 Unspecified condition associated with female genital organs and menstrual cycle: Secondary | ICD-10-CM | POA: Diagnosis present

## 2013-01-12 LAB — URINALYSIS, ROUTINE W REFLEX MICROSCOPIC
Bilirubin Urine: NEGATIVE
Glucose, UA: NEGATIVE mg/dL
Glucose, UA: NEGATIVE mg/dL
Ketones, ur: 15 mg/dL — AB
Ketones, ur: NEGATIVE mg/dL
Leukocytes, UA: NEGATIVE
Leukocytes, UA: NEGATIVE
Nitrite: NEGATIVE
Protein, ur: NEGATIVE mg/dL
Protein, ur: NEGATIVE mg/dL
Urobilinogen, UA: 1 mg/dL (ref 0.0–1.0)

## 2013-01-12 LAB — CBC WITH DIFFERENTIAL/PLATELET
Basophils Absolute: 0 10*3/uL (ref 0.0–0.1)
Basophils Relative: 0 % (ref 0–1)
Eosinophils Relative: 2 % (ref 0–5)
HCT: 38.5 % (ref 36.0–46.0)
MCHC: 34.8 g/dL (ref 30.0–36.0)
MCV: 84.4 fL (ref 78.0–100.0)
Monocytes Absolute: 0.7 10*3/uL (ref 0.1–1.0)
Platelets: 154 10*3/uL (ref 150–400)
RDW: 13.2 % (ref 11.5–15.5)

## 2013-01-12 LAB — URINE MICROSCOPIC-ADD ON

## 2013-01-12 MED ORDER — TRAMADOL HCL 50 MG PO TABS
100.0000 mg | ORAL_TABLET | Freq: Once | ORAL | Status: AC
  Administered 2013-01-12: 100 mg via ORAL
  Filled 2013-01-12: qty 2

## 2013-01-12 MED ORDER — ACETAMINOPHEN 325 MG PO TABS: 650.0000 mg | ORAL_TABLET | Freq: Four times a day (QID) | ORAL | Status: DC | PRN

## 2013-01-12 MED ORDER — ONDANSETRON HCL 4 MG/2ML IJ SOLN: 4.0000 mg | Freq: Four times a day (QID) | INTRAMUSCULAR | Status: DC | PRN

## 2013-01-12 MED ORDER — ZOLPIDEM TARTRATE 5 MG PO TABS
5.0000 mg | ORAL_TABLET | Freq: Every evening | ORAL | Status: DC | PRN
  Administered 2013-01-14: 5 mg via ORAL
  Filled 2013-01-12: qty 1

## 2013-01-12 MED ORDER — ACETAMINOPHEN 325 MG PO TABS
650.0000 mg | ORAL_TABLET | Freq: Once | ORAL | Status: AC
  Administered 2013-01-12: 650 mg via ORAL
  Filled 2013-01-12: qty 2

## 2013-01-12 MED ORDER — TRAMADOL HCL 50 MG PO TABS
100.0000 mg | ORAL_TABLET | Freq: Four times a day (QID) | ORAL | Status: DC | PRN
  Administered 2013-01-13 (×3): 50 mg via ORAL
  Administered 2013-01-13: 100 mg via ORAL
  Administered 2013-01-14 (×2): 50 mg via ORAL
  Administered 2013-01-14: 100 mg via ORAL
  Administered 2013-01-15: 50 mg via ORAL
  Filled 2013-01-12: qty 1
  Filled 2013-01-12: qty 2
  Filled 2013-01-12 (×2): qty 1
  Filled 2013-01-12: qty 2
  Filled 2013-01-12: qty 1
  Filled 2013-01-12: qty 2
  Filled 2013-01-12: qty 1
  Filled 2013-01-12 (×2): qty 2

## 2013-01-12 MED ORDER — KETOROLAC TROMETHAMINE 10 MG PO TABS
10.0000 mg | ORAL_TABLET | Freq: Four times a day (QID) | ORAL | Status: DC | PRN
  Administered 2013-01-13 – 2013-01-15 (×3): 10 mg via ORAL
  Filled 2013-01-12: qty 1

## 2013-01-12 MED ORDER — ONDANSETRON HCL 4 MG PO TABS: 4.0000 mg | ORAL_TABLET | Freq: Four times a day (QID) | ORAL | Status: DC | PRN

## 2013-01-12 MED ORDER — SODIUM CHLORIDE 0.9 % IV SOLN
3.0000 g | Freq: Three times a day (TID) | INTRAVENOUS | Status: DC
  Administered 2013-01-13 – 2013-01-14 (×5): 3 g via INTRAVENOUS
  Filled 2013-01-12 (×7): qty 3

## 2013-01-12 MED ORDER — SODIUM CHLORIDE 0.9 % IV SOLN
INTRAVENOUS | Status: DC
  Administered 2013-01-12 – 2013-01-13 (×2): via INTRAVENOUS

## 2013-01-12 MED ORDER — KETOROLAC TROMETHAMINE 60 MG/2ML IM SOLN
60.0000 mg | Freq: Once | INTRAMUSCULAR | Status: AC
  Administered 2013-01-12: 60 mg via INTRAMUSCULAR
  Filled 2013-01-12: qty 2

## 2013-01-12 NOTE — MAU Note (Signed)
Patient presents to MAU with c/o lower abdominal pain radiating to lower back pain; reports fever of 102.4 earlier today; is taking Tramadol (50mg  at 1500 today) and Ketoralac (10 mg at 1630) for pain.  Reports spotting enough to require wearing a pantyliner. States she is scheduled for procedure with Dr. Tenny Craw on Thursday for uterine bleeding.

## 2013-01-12 NOTE — MAU Note (Signed)
Had CT yesterday for abd and back pain. Was seen by Dr Duane Lope yesterday and CT showed lesions and blood pockets on interior and exterior of uterus. Fever of 102.4

## 2013-01-13 ENCOUNTER — Ambulatory Visit (HOSPITAL_COMMUNITY)
Admit: 2013-01-13 | Discharge: 2013-01-13 | Disposition: A | Discharge status: Home / Self Care | Payer: Managed Care, Other (non HMO) | Attending: Advanced Practice Midwife | Admitting: Advanced Practice Midwife

## 2013-01-13 LAB — CBC WITH DIFFERENTIAL/PLATELET
Basophils Absolute: 0 10*3/uL (ref 0.0–0.1)
Basophils Relative: 0 % (ref 0–1)
Eosinophils Absolute: 0.1 10*3/uL (ref 0.0–0.7)
HCT: 36 % (ref 36.0–46.0)
Hemoglobin: 12.4 g/dL (ref 12.0–15.0)
MCH: 29.2 pg (ref 26.0–34.0)
MCHC: 34.4 g/dL (ref 30.0–36.0)
Monocytes Absolute: 0.5 10*3/uL (ref 0.1–1.0)
Monocytes Relative: 11 % (ref 3–12)
Neutro Abs: 1.8 10*3/uL (ref 1.7–7.7)
RDW: 13.3 % (ref 11.5–15.5)

## 2013-01-13 MED ORDER — MAGNESIUM HYDROXIDE 400 MG/5ML PO SUSP
30.0000 mL | Freq: Every day | ORAL | Status: DC | PRN
  Administered 2013-01-13: 30 mL via ORAL
  Filled 2013-01-13: qty 30

## 2013-01-13 MED ORDER — ACETAMINOPHEN 500 MG PO TABS
1000.0000 mg | ORAL_TABLET | Freq: Four times a day (QID) | ORAL | Status: DC | PRN
  Administered 2013-01-13 – 2013-01-14 (×2): 1000 mg via ORAL
  Administered 2013-01-15: 500 mg via ORAL
  Filled 2013-01-13: qty 1
  Filled 2013-01-13: qty 2
  Filled 2013-01-13: qty 1
  Filled 2013-01-13: qty 2

## 2013-01-13 MED ORDER — SODIUM CHLORIDE 0.9 % IJ SOLN
3.0000 mL | Freq: Two times a day (BID) | INTRAMUSCULAR | Status: DC
  Administered 2013-01-13 – 2013-01-16 (×4): 3 mL via INTRAVENOUS

## 2013-01-13 MED ORDER — DOCUSATE SODIUM 100 MG PO CAPS
100.0000 mg | ORAL_CAPSULE | Freq: Two times a day (BID) | ORAL | Status: DC | PRN
  Administered 2013-01-13: 100 mg via ORAL
  Filled 2013-01-13: qty 1

## 2013-01-13 MED ORDER — GADOBENATE DIMEGLUMINE 529 MG/ML IV SOLN
17.0000 mL | Freq: Once | INTRAVENOUS | Status: AC | PRN
  Administered 2013-01-13: 17 mL via INTRAVENOUS

## 2013-01-13 NOTE — H&P (Addendum)
42 y.o. yo complains of lower abdominal pain and fever.     She is [id number] non-pregnant female who presented on 4-16 with right and mid low abd pain, 10/10 at worst and alternating loose stools and feeling constipated x 2-3 weeks. Concerned because pain is similar to pain that she had after complications from Endometrial Ablation in 2012. Developed infection. Has only had two episodes of menstrual spotting since then.  She was seen in MAU 12/20/12. Tx BV. GC/CT neg. Clinical Dx ovarian cyst. No imaging. Has appt scheduled w/ Dr. Donovan Kail in 1 week. Had appt w/ [facility] yesterday. Scheduled for a CT 01/11/13, endoscopy and colonoscopy.   On 4-16 she had a U/S that showed a uterus: 9.4 x 6.3 x 0.6 cm with multiple fibroids, the largest being 2 cm in diameter. There were two cystic structures at the uterine fundus. One of these is present on the prior CT scan. In the endometrium there were multiple unusual mass-like hyperechoic areas in the endometrial cavity with no perfusion to those areas. They may represent loculated areas of hemorrhage. The patient may have synechia in the endometrial cavity creating loculated blood collections. Right ovary: 4.8 x 1.7 x 3.1 cm. Small hemorrhagic cyst. Left ovary: 2.2 x 1.1 x 2.1 cm. Small simple cyst. Other findings: No free fluid.     She had a CT on 4-17 that showed a normal external uterine exam with Bilateral ovaries are notable for dominant follicles. The appearance of the right ovary may also reflect a dilated fallopian tube.  Irregular/heterogeneous appearance of the uterus with multiple hypoenhancing lesions which are either myometrial (favored) or endometrial in etiology (series 2/images 73 and 75). The endometrium may be poorly visualized/distorted by the lesions (series 2/image 74). The appearance is favored to reflect degenerating fibroids, although a complex endometrial abnormality is not excluded.  Her colonoscopy and endoscopy were canceled and Dr. Tenny Craw was  due to try to dilate cervix in office and allow drainage of hematometria.  Yesterday the pt developed a fever to 102.3 and continued worsening abdominal pain.  She is tolerating PO and voiding.  Her bowel movements have been nml.  She c/o lower abdominal pressure extending to back. The CT recommended a MRI for evaluation of these areas that may be degenerating fibroids vs a more complex endometrial abnormality (like areas of loculated blood).  Past Medical History  Diagnosis Date  . Hypertension   . Dysmenorrhea   . Anxiety   . Depression   . PONV (postoperative nausea and vomiting)    Past Surgical History  Procedure Laterality Date  . Dilation and curettage of uterus    . Uterine ablation  08/13/2011    History   Social History  . Marital Status: Divorced    Spouse Name: N/A    Number of Children: 2  . Years of Education: N/A   Occupational History  . MORTGAGE COLLECTOR    Social History Main Topics  . Smoking status: Current Every Day Smoker -- 0.25 packs/day for 20 years    Types: Cigarettes  . Smokeless tobacco: Never Used  . Alcohol Use: 0.6 oz/week    1 Glasses of wine per week     Comment: occasionally  . Drug Use: No  . Sexually Active: Yes    Birth Control/ Protection: None   Other Topics Concern  . Not on file   Social History Narrative  . No narrative on file    Current Facility-Administered Medications on File Prior  to Encounter  Medication Dose Route Frequency Provider Last Rate Last Dose  . lactated ringers with KCl 20 mEq/L infusion   Intravenous Continuous Miguel Aschoff, MD       Current Outpatient Prescriptions on File Prior to Encounter  Medication Sig Dispense Refill  . ALPRAZolam (XANAX) 0.25 MG tablet Take 0.25 mg by mouth daily as needed. For anxiety      . ketorolac (TORADOL) 10 MG tablet Take 1 tablet (10 mg total) by mouth every 6 (six) hours as needed for pain.  20 tablet  0    Allergies  Allergen Reactions  . Codeine Anaphylaxis  .  Doxycycline Rash    Filed Vitals:   01/12/13 2109 01/12/13 2346 01/13/13 0030 01/13/13 0525  BP: 149/106 138/77 117/78 111/73  Pulse: 86 75 72 60  Temp: 100.3 F (37.9 C) 98.8 F (37.1 C) 98.4 F (36.9 C) 97.8 F (36.6 C)  TempSrc: Oral  Oral Oral  Resp: 20 20 18 18   Height: 5\' 6"  (1.676 m)     Weight: 79.198 kg (174 lb 9.6 oz)     SpO2: 100%  96% 95%    Lungs: clear to ascultation Cor:  RRR Abdomen:  soft, nontender, nondistended. Ex:  no cords, erythema Pelvic:  Deferred for now.  Pelvic done by NP yesterday was very tender, small amount of blood in vault and no discreet masses felt.  Results for orders placed during the hospital encounter of 01/12/13 (from the past 24 hour(s))  URINALYSIS, ROUTINE W REFLEX MICROSCOPIC     Status: Abnormal   Collection Time    01/12/13  9:32 PM      Result Value Range   Color, Urine YELLOW  YELLOW   APPearance CLEAR  CLEAR   Specific Gravity, Urine 1.020  1.005 - 1.030   pH 7.0  5.0 - 8.0   Glucose, UA NEGATIVE  NEGATIVE mg/dL   Hgb urine dipstick MODERATE (*) NEGATIVE   Bilirubin Urine NEGATIVE  NEGATIVE   Ketones, ur 15 (*) NEGATIVE mg/dL   Protein, ur NEGATIVE  NEGATIVE mg/dL   Urobilinogen, UA 1.0  0.0 - 1.0 mg/dL   Nitrite NEGATIVE  NEGATIVE   Leukocytes, UA NEGATIVE  NEGATIVE  URINE MICROSCOPIC-ADD ON     Status: Abnormal   Collection Time    01/12/13  9:32 PM      Result Value Range   Squamous Epithelial / LPF RARE  RARE   WBC, UA 0-2  <3 WBC/hpf   RBC / HPF 3-6  <3 RBC/hpf   Bacteria, UA FEW (*) RARE  CBC WITH DIFFERENTIAL     Status: None   Collection Time    01/12/13  9:54 PM      Result Value Range   WBC 7.4  4.0 - 10.5 K/uL   RBC 4.56  3.87 - 5.11 MIL/uL   Hemoglobin 13.4  12.0 - 15.0 g/dL   HCT 16.1  09.6 - 04.5 %   MCV 84.4  78.0 - 100.0 fL   MCH 29.4  26.0 - 34.0 pg   MCHC 34.8  30.0 - 36.0 g/dL   RDW 40.9  81.1 - 91.4 %   Platelets 154  150 - 400 K/uL   Neutrophils Relative 65  43 - 77 %   Neutro Abs  4.7  1.7 - 7.7 K/uL   Lymphocytes Relative 25  12 - 46 %   Lymphs Abs 1.9  0.7 - 4.0 K/uL   Monocytes Relative 9  3 -  12 %   Monocytes Absolute 0.7  0.1 - 1.0 K/uL   Eosinophils Relative 2  0 - 5 %   Eosinophils Absolute 0.1  0.0 - 0.7 K/uL   Basophils Relative 0  0 - 1 %   Basophils Absolute 0.0  0.0 - 0.1 K/uL  URINALYSIS, ROUTINE W REFLEX MICROSCOPIC     Status: None   Collection Time    01/12/13 11:07 PM      Result Value Range   Color, Urine YELLOW  YELLOW   APPearance CLEAR  CLEAR   Specific Gravity, Urine 1.010  1.005 - 1.030   pH 7.5  5.0 - 8.0   Glucose, UA NEGATIVE  NEGATIVE mg/dL   Hgb urine dipstick NEGATIVE  NEGATIVE   Bilirubin Urine NEGATIVE  NEGATIVE   Ketones, ur NEGATIVE  NEGATIVE mg/dL   Protein, ur NEGATIVE  NEGATIVE mg/dL   Urobilinogen, UA 0.2  0.0 - 1.0 mg/dL   Nitrite NEGATIVE  NEGATIVE   Leukocytes, UA NEGATIVE  NEGATIVE  CBC WITH DIFFERENTIAL     Status: Abnormal   Collection Time    01/13/13  6:10 AM      Result Value Range   WBC 4.2  4.0 - 10.5 K/uL   RBC 4.24  3.87 - 5.11 MIL/uL   Hemoglobin 12.4  12.0 - 15.0 g/dL   HCT 10.2  72.5 - 36.6 %   MCV 84.9  78.0 - 100.0 fL   MCH 29.2  26.0 - 34.0 pg   MCHC 34.4  30.0 - 36.0 g/dL   RDW 44.0  34.7 - 42.5 %   Platelets 143 (*) 150 - 400 K/uL   Neutrophils Relative 44  43 - 77 %   Neutro Abs 1.8  1.7 - 7.7 K/uL   Lymphocytes Relative 42  12 - 46 %   Lymphs Abs 1.8  0.7 - 4.0 K/uL   Monocytes Relative 11  3 - 12 %   Monocytes Absolute 0.5  0.1 - 1.0 K/uL   Eosinophils Relative 3  0 - 5 %   Eosinophils Absolute 0.1  0.0 - 0.7 K/uL   Basophils Relative 0  0 - 1 %   Basophils Absolute 0.0  0.0 - 0.1 K/uL    A:  42 yo with hx of endometritis postop Ablation in 2012.  Now with fever and abdominal pain and e/o of degenerating fibroids versus loculated blood in uterus and hematometria, possibly superinfected.     P:   1.  Pt admitted for Zosyn and pain control. 2.  MRI of pelvis to see delineate  etiology in uterus.  Kaytie Ratcliffe A

## 2013-01-13 NOTE — Progress Notes (Signed)
Report called to Debbie RN on Women's Unit.

## 2013-01-14 MED ORDER — CLONAZEPAM 0.5 MG PO TABS
0.5000 mg | ORAL_TABLET | Freq: Once | ORAL | Status: AC
  Administered 2013-01-14: 0.5 mg via ORAL
  Filled 2013-01-14: qty 1

## 2013-01-14 NOTE — Progress Notes (Deleted)
((((((((  Hypoglycemic Event  CBG: 58 (0545)    Treatment: 15 GM carbohydrate snack  Symptoms: None  Follow-up CBG: Time:0600 CBG Result: 69         Time: 0615    CBG Result: 109 Possible Reasons for Event: unknown  Comments/MD :  recorded  Lori Jensen  Remember to initiate Hypoglycemia Order Set & complete))))))))   THIS NOTE ENTERED IN ERROR ON WRONG PT- MS. Bier DID NOT HAVE A HYPOGLYCEMIC EVENT.  D/W NURSES THIS HAPPENED TO ANOTHER PT.   Wrong entry

## 2013-01-14 NOTE — Progress Notes (Signed)
Called to patients room.  Patient states "I feel like I can't take a deep breath and I feel more pressure in my abdomin."  Lungs clear to auscultation, O2 sat. 100% on room air, pulse 60, BP 121/86 and Resp. 18.  Dr. Henderson Cloud notified of all the above and new orders received.  Patient also states " I have felt like this before with anxiety."  Dr. Henderson Cloud also made aware .

## 2013-01-14 NOTE — Progress Notes (Addendum)
Patient is eating, ambulating, and voiding.  Pain control is good.  Had an episode of pain this am which was treated with pain meds.    Progress note about hypoglycemia is not for this patient.  Filed Vitals:   01/13/13 1438 01/13/13 1800 01/13/13 2157 01/14/13 0530  BP: 140/85 138/91 133/81 110/90  Pulse: 56 61 56 78  Temp: 98.7 F (37.1 C) 97.8 F (36.6 C) 97.8 F (36.6 C) 98.1 F (36.7 C)  TempSrc: Oral Oral Oral Oral  Resp: 18 20 18 18   Height:      Weight:      SpO2: 100% 99% 98% 99%   lungs:   clear to auscultation cor:    RRR Abdomen:  soft, nt, nd. ex:    no cords   Lab Results  Component Value Date   WBC 4.2 01/13/2013   HGB 12.4 01/13/2013   HCT 36.0 01/13/2013   MCV 84.9 01/13/2013   PLT 143* 01/13/2013   MRI confirmed loculated areas of hemorrhage.  No pelvic abnormalities.  A/P  Loculated hematometria, possible superinfected.   Continue Zosyn for at least 48 hrs afeb.  At high risk for perforation if dilating cervix; hematometria is loculated as well.

## 2013-01-15 ENCOUNTER — Inpatient Hospital Stay (HOSPITAL_COMMUNITY): Payer: Managed Care, Other (non HMO)

## 2013-01-15 ENCOUNTER — Encounter: Payer: Managed Care, Other (non HMO) | Admitting: Gastroenterology

## 2013-01-15 ENCOUNTER — Ambulatory Visit (HOSPITAL_COMMUNITY): Payer: Managed Care, Other (non HMO)

## 2013-01-15 LAB — SURGICAL PCR SCREEN: Staphylococcus aureus: NEGATIVE

## 2013-01-15 MED ORDER — CLONAZEPAM 0.5 MG PO TABS
0.5000 mg | ORAL_TABLET | Freq: Three times a day (TID) | ORAL | Status: DC | PRN
  Administered 2013-01-15 – 2013-01-16 (×3): 0.5 mg via ORAL
  Filled 2013-01-15 (×3): qty 1

## 2013-01-15 MED ORDER — MISOPROSTOL 200 MCG PO TABS: 100.0000 ug | ORAL_TABLET | Freq: Once | ORAL | Status: DC

## 2013-01-15 MED ORDER — SODIUM CHLORIDE 0.9 % IV SOLN
3.0000 g | Freq: Four times a day (QID) | INTRAVENOUS | Status: DC
  Administered 2013-01-15 – 2013-01-17 (×8): 3 g via INTRAVENOUS
  Filled 2013-01-15 (×10): qty 3

## 2013-01-15 MED ORDER — OXYCODONE-ACETAMINOPHEN 5-325 MG PO TABS
1.0000 | ORAL_TABLET | ORAL | Status: DC | PRN
  Administered 2013-01-15 (×2): 1 via ORAL
  Administered 2013-01-16: 2 via ORAL
  Administered 2013-01-16: 1 via ORAL
  Administered 2013-01-16: 2 via ORAL
  Filled 2013-01-15 (×2): qty 2
  Filled 2013-01-15: qty 1
  Filled 2013-01-15: qty 2
  Filled 2013-01-15: qty 1

## 2013-01-15 NOTE — MAU Provider Note (Signed)
LATE ENTRY  Chief Complaint:  Abdominal Pain, Back Pain and Fever  First Provider Initiated Contact with Patient 01/12/13 2139    HPI: Lori Jensen is a 42 y.o. [phone] non-pregnant female who presents to maternity admissions reporting fever 102.4 this evening and worsening abd pain. She states she feels that same as she did when she developed a uterine infection after endometrial ablation in 2012. She was seen in MAU 01/09/13 for abd pain and Pelvic US showed possible loculations of blood in the endometrium. She also went to [facility] due to the pain because it had been associated w/ a change in bowel habits. CT showed possible degenerating fibroids and radiologist recommended MRI. [place] deferred further GI workup until Gyn issues had been addressed. Saw Dr. Donovan Kail yesterday. States he plans to try to drain the loculations in the office next week.   Past Medical History: Past Medical History  Diagnosis Date  . Hypertension   . Dysmenorrhea   . Anxiety   . Depression   . PONV (postoperative nausea and vomiting)     Past obstetric history: OB History   Grav Para Term Preterm Abortions TAB SAB Ect Mult Living   7 2 2  5 2 3   2      # Outc Date GA Lbr Len/2nd Wgt Sex Del Anes PTL Lv   1 TAB            2 TAB            3 SAB            4 SAB            5 SAB            6 TRM            7 TRM               Past Surgical History: Past Surgical History  Procedure Laterality Date  . Dilation and curettage of uterus    . Uterine ablation  08/13/2011    Family History: Family History  Problem Relation Age of Onset  . Heart disease Mother   . Stomach cancer Father   . Heart disease Father     Social History: History  Substance Use Topics  . Smoking status: Current Every Day Smoker -- 0.25 packs/day for 20 years    Types: Cigarettes  . Smokeless tobacco: Never Used  . Alcohol Use: 0.6 oz/week    1 Glasses of wine per week     Comment: occasionally     Allergies:  Allergies  Allergen Reactions  . Codeine Anaphylaxis  . Doxycycline Rash    Meds:  Prescriptions prior to admission  Medication Sig Dispense Refill  . ALPRAZolam (XANAX) 0.25 MG tablet Take 0.25 mg by mouth daily as needed. For anxiety      . ketorolac (TORADOL) 10 MG tablet Take 1 tablet (10 mg total) by mouth every 6 (six) hours as needed for pain.  20 tablet  0  . traMADol (ULTRAM) 50 MG tablet Take 50 mg by mouth 3 (three) times daily.        ROS: Pertinent findings in history of present illness.  Physical Exam  BP: 149/106, 111/73 Temp: 100.3 Pulse: 86 Resp: 20 SpO2: 100%  GENERAL: Well-developed, well-nourished female in moderate distress. Shaking w/ chills.  HEENT: normocephalic HEART: normal rate RESP: normal effort ABDOMEN: Soft, moderately tender across low abd. Normal BS x 4. Pos guarding,  Neg rebound or mass. EXTREMITIES: Nontender, no edema NEURO: alert and oriented SPECULUM EXAM: Deferred. BIMANUAL EXAM: NEFG. Pos uterine tenderness. Neg adnexal masses or tenderness. Neg CMT. Scant tan blood on glove. Cervix closed.   Labs: Results for orders placed during the hospital encounter of 01/12/13 (from the past 168 hour(s))  URINALYSIS, ROUTINE W REFLEX MICROSCOPIC   Collection Time    01/12/13  9:32 PM      Result Value Range   Color, Urine YELLOW  YELLOW   APPearance CLEAR  CLEAR   Specific Gravity, Urine 1.020  1.005 - 1.030   pH 7.0  5.0 - 8.0   Glucose, UA NEGATIVE  NEGATIVE mg/dL   Hgb urine dipstick MODERATE (*) NEGATIVE   Bilirubin Urine NEGATIVE  NEGATIVE   Ketones, ur 15 (*) NEGATIVE mg/dL   Protein, ur NEGATIVE  NEGATIVE mg/dL   Urobilinogen, UA 1.0  0.0 - 1.0 mg/dL   Nitrite NEGATIVE  NEGATIVE   Leukocytes, UA NEGATIVE  NEGATIVE  URINE MICROSCOPIC-ADD ON   Collection Time    01/12/13  9:32 PM      Result Value Range   Squamous Epithelial / LPF RARE  RARE   WBC, UA 0-2  <3 WBC/hpf   RBC / HPF 3-6  <3 RBC/hpf   Bacteria,  UA FEW (*) RARE  CBC WITH DIFFERENTIAL   Collection Time    01/12/13  9:54 PM      Result Value Range   WBC 7.4  4.0 - 10.5 K/uL   RBC 4.56  3.87 - 5.11 MIL/uL   Hemoglobin 13.4  12.0 - 15.0 g/dL   HCT 40.9  81.1 - 91.4 %   MCV 84.4  78.0 - 100.0 fL   MCH 29.4  26.0 - 34.0 pg   MCHC 34.8  30.0 - 36.0 g/dL   RDW 78.2  95.6 - 21.3 %   Platelets 154  150 - 400 K/uL   Neutrophils Relative 65  43 - 77 %   Neutro Abs 4.7  1.7 - 7.7 K/uL   Lymphocytes Relative 25  12 - 46 %   Lymphs Abs 1.9  0.7 - 4.0 K/uL   Monocytes Relative 9  3 - 12 %   Monocytes Absolute 0.7  0.1 - 1.0 K/uL   Eosinophils Relative 2  0 - 5 %   Eosinophils Absolute 0.1  0.0 - 0.7 K/uL   Basophils Relative 0  0 - 1 %   Basophils Absolute 0.0  0.0 - 0.1 K/uL  URINALYSIS, ROUTINE W REFLEX MICROSCOPIC   Collection Time    01/12/13 11:07 PM      Result Value Range   Color, Urine YELLOW  YELLOW   APPearance CLEAR  CLEAR   Specific Gravity, Urine 1.010  1.005 - 1.030   pH 7.5  5.0 - 8.0   Glucose, UA NEGATIVE  NEGATIVE mg/dL   Hgb urine dipstick NEGATIVE  NEGATIVE   Bilirubin Urine NEGATIVE  NEGATIVE   Ketones, ur NEGATIVE  NEGATIVE mg/dL   Protein, ur NEGATIVE  NEGATIVE mg/dL   Urobilinogen, UA 0.2  0.0 - 1.0 mg/dL   Nitrite NEGATIVE  NEGATIVE   Leukocytes, UA NEGATIVE  NEGATIVE   Repeated w/ cath specimen due to possible contamination.  Imaging:  US Transvaginal Non-ob  01/09/2013  *RADIOLOGY REPORT*  Clinical Data: Right lower quadrant abdominal pain.  TRANSABDOMINAL AND TRANSVAGINAL ULTRASOUND OF PELVIS Technique:  Both transabdominal and transvaginal ultrasound examinations of the pelvis were performed. Transabdominal technique  was performed for global imaging of the pelvis including uterus, ovaries, adnexal regions, and pelvic cul-de-sac.  It was necessary to proceed with endovaginal exam following the transabdominal exam to visualize the uterus, endometrium, and ovaries.  Comparison:  CT scan dated 08/28/2011   Findings:  Uterus: 9.4 x 6.3 x 0.6 cm.  There are multiple fibroids, the largest being 2 cm in diameter.  There are two cystic structures at the uterine fundus.  One of these is present on the prior CT scan.  Endometrium: There are multiple unusual mass-like hyperechoic areas in the endometrial cavity.  There is no perfusion to these areas. These may represent loculated areas of hemorrhage.  The patient may have synechia in the endometrial cavity creating loculated blood collections.  Right ovary:  4.8 x 1.7 x 3.1 cm.  Small hemorrhagic cyst.  Left ovary: 2.2 x 1.1 x 2.1 cm.  Small simple cyst.  Other findings: No free fluid  IMPRESSION: Abnormal appearance of the endometrial cavity with a hyper echoic loculated areas suggesting a loculated blood.  The cystic areas adjacent to the uterine fundus are indeterminate. One of these is present on the prior CT scan.   Original Report Authenticated By: Francene Boyers, M.D.    US Pelvis Complete  01/09/2013  *RADIOLOGY REPORT*  Clinical Data: Right lower quadrant abdominal pain.  TRANSABDOMINAL AND TRANSVAGINAL ULTRASOUND OF PELVIS Technique:  Both transabdominal and transvaginal ultrasound examinations of the pelvis were performed. Transabdominal technique was performed for global imaging of the pelvis including uterus, ovaries, adnexal regions, and pelvic cul-de-sac.  It was necessary to proceed with endovaginal exam following the transabdominal exam to visualize the uterus, endometrium, and ovaries.  Comparison:  CT scan dated 08/28/2011  Findings:  Uterus: 9.4 x 6.3 x 0.6 cm.  There are multiple fibroids, the largest being 2 cm in diameter.  There are two cystic structures at the uterine fundus.  One of these is present on the prior CT scan.  Endometrium: There are multiple unusual mass-like hyperechoic areas in the endometrial cavity.  There is no perfusion to these areas. These may represent loculated areas of hemorrhage.  The patient may have synechia in the  endometrial cavity creating loculated blood collections.  Right ovary:  4.8 x 1.7 x 3.1 cm.  Small hemorrhagic cyst.  Left ovary: 2.2 x 1.1 x 2.1 cm.  Small simple cyst.  Other findings: No free fluid  IMPRESSION: Abnormal appearance of the endometrial cavity with a hyper echoic loculated areas suggesting a loculated blood.  The cystic areas adjacent to the uterine fundus are indeterminate. One of these is present on the prior CT scan.   Original Report Authenticated By: Francene Boyers, M.D.    Ct Abdomen Pelvis W Contrast  01/11/2013  *RADIOLOGY REPORT*  Clinical Data: Severe lower abdominal pain, nausea, new menstrual bleeding with history of ablation  CT ABDOMEN AND PELVIS WITH CONTRAST  Technique:  Multidetector CT imaging of the abdomen and pelvis was performed following the standard protocol during bolus administration of intravenous contrast.  Contrast: OMNIPAQUE IOHEXOL 300 MG/ML  SOLN  Comparison: Pelvic ultrasound dated 01/09/2013.  CT abdomen/pelvis dated 08/28/2011.  Findings: Lung bases are clear.  Liver, spleen, pancreas, and adrenal glands are within normal limits.  Gallbladder is unremarkable.  No intrahepatic or extrahepatic ductal dilatation.  Kidneys are within normal limits.  No hydronephrosis.  No evidence of bowel obstruction.  Normal appendix.  No evidence of abdominal aortic aneurysm.  No abdominopelvic ascites.  No suspicious abdominopelvic  lymphadenopathy.  Bilateral ovaries are notable for dominant follicles.  The appearance of the right ovary may also reflect a dilated fallopian tube.  Irregular/heterogeneous appearance of the uterus with multiple hypoenhancing lesions which are either myometrial (favored) or endometrial in etiology (series 2/images 73 and 75).  The endometrium may be poorly visualized/distorted by the lesions (series 2/image 74).  The appearance is favored to reflect degenerating fibroids, although a complex endometrial abnormality is not excluded.  Bladder is  within normal limits.  Visualized osseous structures are within normal limits.  IMPRESSION: No evidence of bowel obstruction.  Normal appendix.  Heterogeneous appearance of the uterus, as described above, poorly evaluated on CT.  The appearance is favored to reflect multiple necrotic fibroids, although a complex endometrial abnormality is also possible.  Consider OB-GYN evaluation with endometrial sampling and/or pelvic MRI with/without contrast.  A short segment right hydrosalpinx is also possible.   Original Report Authenticated By: Charline Bills, M.D.    MAU Course: Pain improved w/ Ultram and Toradol. Requested Tylenol for fever and chills, given.   Assessment: 1. Endometrial masses-degenerating fibroids vs loculations of blood.   2. Fever   3. Hydrosalpinx    Plan: Admit to Women's Unit per consult w/ Dr. Henderson Cloud for ABX, MRI and pain management.   [place], CNM 01/15/2013 12:42 AM

## 2013-01-15 NOTE — Progress Notes (Signed)
Patient ID: CHARM STENNER, female   DOB: 1971-05-18, 42 y.o.   MRN: [id number]  Studies reveal collection of fluid within the uterus consistent with an hematometria. Will take to the OR on 01/16/2013 for D&C to dilate the cavity and evacuate the blood. Patient understands that if there are areas of adenomyosis that this may not resolve all the pain. Risks and benefits have been discussed.

## 2013-01-15 NOTE — Progress Notes (Signed)
Ur chart review completed.  

## 2013-01-15 NOTE — Progress Notes (Signed)
Patient ID: Lori Jensen, female   DOB: [birth year], 42 y.o.   MRN: [id number]  S: Patient is feeling better this morning. She was having severe left-sided pain last night. She has not had a fever for 24 hours. She denies nausea or vomiting. Her pain this morning as a 1-2/10. OCeasar Mons Vitals:   01/14/13 1809 01/14/13 2150 01/15/13 0635 01/15/13 0845  BP: 131/87 131/74 133/74 126/84  Pulse: 59 59 62 74  Temp: 98.2 F (36.8 C) 98.1 F (36.7 C) 97.8 F (36.6 C) 98.2 F (36.8 C)  TempSrc: Oral Oral Oral Oral  Resp: 18 20 18 18   Height:      Weight:      SpO2:  98% 99% 98%   AOx3, NAD Abd soft, NT/ND No C/C/E  A/P 1) Discussed case with the patient's primary physician Dr. Miguel Aschoff. Will plan to proceed with exam under anesthesia and dilation of cervical stenosis in the operating room on 01/16/2013. Will continue on Unasyn. 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 She has tolerated fentanyl before. We will try oral Percocet and monitor closely for any symptoms of anaphylaxis. N.p.o. After midnight for her procedure tomorrow. Clonopin 0.5 mg 3 times a day when necessary anxiety

## 2013-01-16 ENCOUNTER — Encounter (HOSPITAL_COMMUNITY)
Admission: AD | Disposition: A | Discharge status: Home / Self Care | Payer: Self-pay | Source: Ambulatory Visit | Attending: Obstetrics and Gynecology

## 2013-01-16 ENCOUNTER — Encounter (HOSPITAL_COMMUNITY): Payer: Self-pay | Admitting: Anesthesiology

## 2013-01-16 ENCOUNTER — Inpatient Hospital Stay (HOSPITAL_COMMUNITY): Payer: Managed Care, Other (non HMO) | Admitting: Anesthesiology

## 2013-01-16 ENCOUNTER — Encounter (HOSPITAL_COMMUNITY): Payer: Self-pay

## 2013-01-16 HISTORY — PX: DILATION AND CURETTAGE OF UTERUS: SHX78

## 2013-01-16 SURGERY — DILATION AND CURETTAGE
Anesthesia: Monitor Anesthesia Care

## 2013-01-16 MED ORDER — MIDAZOLAM HCL 5 MG/5ML IJ SOLN
INTRAMUSCULAR | Status: DC | PRN
  Administered 2013-01-16: 2 mg via INTRAVENOUS

## 2013-01-16 MED ORDER — MIDAZOLAM HCL 2 MG/2ML IJ SOLN
INTRAMUSCULAR | Status: AC
  Filled 2013-01-16: qty 2

## 2013-01-16 MED ORDER — KETOROLAC TROMETHAMINE 30 MG/ML IJ SOLN
INTRAMUSCULAR | Status: AC
  Filled 2013-01-16: qty 1

## 2013-01-16 MED ORDER — SCOPOLAMINE 1 MG/3DAYS TD PT72
MEDICATED_PATCH | TRANSDERMAL | Status: AC
  Administered 2013-01-16: 1.5 mg via TRANSDERMAL
  Filled 2013-01-16: qty 1

## 2013-01-16 MED ORDER — PROPOFOL 10 MG/ML IV BOLUS
INTRAVENOUS | Status: DC | PRN
  Administered 2013-01-16 (×3): 20 mg via INTRAVENOUS

## 2013-01-16 MED ORDER — FENTANYL CITRATE 0.05 MG/ML IJ SOLN
25.0000 ug | INTRAMUSCULAR | Status: DC | PRN
  Administered 2013-01-16: 50 ug via INTRAVENOUS

## 2013-01-16 MED ORDER — ONDANSETRON HCL 4 MG/2ML IJ SOLN
INTRAMUSCULAR | Status: DC | PRN
  Administered 2013-01-16: 4 mg via INTRAVENOUS

## 2013-01-16 MED ORDER — DEXAMETHASONE SODIUM PHOSPHATE 10 MG/ML IJ SOLN
INTRAMUSCULAR | Status: DC | PRN
  Administered 2013-01-16: 10 mg via INTRAVENOUS

## 2013-01-16 MED ORDER — LIDOCAINE HCL 1 % IJ SOLN
INTRAMUSCULAR | Status: DC | PRN
  Administered 2013-01-16: 10 mL

## 2013-01-16 MED ORDER — DIPHENHYDRAMINE HCL 50 MG/ML IJ SOLN: 12.5000 mg | Freq: Once | INTRAMUSCULAR | Status: AC

## 2013-01-16 MED ORDER — CITRIC ACID-SODIUM CITRATE 334-500 MG/5ML PO SOLN
ORAL | Status: AC
  Administered 2013-01-16: 30 mL via ORAL
  Filled 2013-01-16: qty 15

## 2013-01-16 MED ORDER — DEXAMETHASONE SODIUM PHOSPHATE 10 MG/ML IJ SOLN
INTRAMUSCULAR | Status: AC
  Filled 2013-01-16: qty 1

## 2013-01-16 MED ORDER — DIPHENHYDRAMINE HCL 50 MG/ML IJ SOLN
INTRAMUSCULAR | Status: AC
  Administered 2013-01-16: 12.5 mg via INTRAVENOUS
  Filled 2013-01-16: qty 1

## 2013-01-16 MED ORDER — GLYCOPYRROLATE 0.2 MG/ML IJ SOLN
INTRAMUSCULAR | Status: AC
  Filled 2013-01-16: qty 1

## 2013-01-16 MED ORDER — LACTATED RINGERS IV SOLN
INTRAVENOUS | Status: DC | PRN
  Administered 2013-01-16: 12:00:00 via INTRAVENOUS

## 2013-01-16 MED ORDER — LIDOCAINE HCL (CARDIAC) 20 MG/ML IV SOLN
INTRAVENOUS | Status: DC | PRN
  Administered 2013-01-16: 40 mg via INTRAVENOUS

## 2013-01-16 MED ORDER — PROPOFOL 10 MG/ML IV EMUL
INTRAVENOUS | Status: AC
  Filled 2013-01-16: qty 20

## 2013-01-16 MED ORDER — ONDANSETRON HCL 4 MG/2ML IJ SOLN
INTRAMUSCULAR | Status: AC
  Filled 2013-01-16: qty 2

## 2013-01-16 MED ORDER — LIDOCAINE HCL (CARDIAC) 20 MG/ML IV SOLN
INTRAVENOUS | Status: AC
  Filled 2013-01-16: qty 5

## 2013-01-16 MED ORDER — FENTANYL CITRATE 0.05 MG/ML IJ SOLN
INTRAMUSCULAR | Status: AC
  Administered 2013-01-16: 50 ug via INTRAVENOUS
  Filled 2013-01-16: qty 2

## 2013-01-16 MED ORDER — KETOROLAC TROMETHAMINE 30 MG/ML IJ SOLN
INTRAMUSCULAR | Status: DC | PRN
  Administered 2013-01-16: 30 mg via INTRAVENOUS

## 2013-01-16 MED ORDER — CITRIC ACID-SODIUM CITRATE 334-500 MG/5ML PO SOLN: 30.0000 mL | Freq: Once | ORAL | Status: AC | PRN

## 2013-01-16 MED ORDER — FENTANYL CITRATE 0.05 MG/ML IJ SOLN
INTRAMUSCULAR | Status: AC
  Filled 2013-01-16: qty 2

## 2013-01-16 MED ORDER — FENTANYL CITRATE 0.05 MG/ML IJ SOLN
INTRAMUSCULAR | Status: DC | PRN
  Administered 2013-01-16 (×2): 50 ug via INTRAVENOUS

## 2013-01-16 MED ORDER — SCOPOLAMINE 1 MG/3DAYS TD PT72: 1.0000 | MEDICATED_PATCH | Freq: Once | TRANSDERMAL | Status: DC | PRN

## 2013-01-16 SURGICAL SUPPLY — 18 items
CATH ROBINSON RED A/P 16FR (CATHETERS) ×3 IMPLANT
CLOTH BEACON ORANGE TIMEOUT ST (SAFETY) ×3 IMPLANT
CONTAINER PREFILL 10% NBF 60ML (FORM) ×3 IMPLANT
DECANTER SPIKE VIAL GLASS SM (MISCELLANEOUS) ×3 IMPLANT
DRESSING TELFA 8X3 (GAUZE/BANDAGES/DRESSINGS) ×3 IMPLANT
GLOVE BIO SURGEON STRL SZ7.5 (GLOVE) ×6 IMPLANT
GOWN PREVENTION PLUS XXLARGE (GOWN DISPOSABLE) ×3 IMPLANT
GOWN STRL REIN XL XLG (GOWN DISPOSABLE) ×6 IMPLANT
NEEDLE SPNL 22GX3.5 QUINCKE BK (NEEDLE) ×3 IMPLANT
PACK VAGINAL MINOR WOMEN LF (CUSTOM PROCEDURE TRAY) ×3 IMPLANT
PAD OB MATERNITY 4.3X12.25 (PERSONAL CARE ITEMS) ×3 IMPLANT
PAD PREP 24X48 CUFFED NSTRL (MISCELLANEOUS) ×3 IMPLANT
SET BERKELEY SUCTION TUBING (SUCTIONS) ×3 IMPLANT
SYR CONTROL 10ML LL (SYRINGE) ×3 IMPLANT
TOP DISP BERKELEY (MISCELLANEOUS) ×3 IMPLANT
TOWEL OR 17X24 6PK STRL BLUE (TOWEL DISPOSABLE) ×6 IMPLANT
VACURETTE 7MM CVD STRL WRAP (CANNULA) ×3 IMPLANT
WATER STERILE IRR 1000ML POUR (IV SOLUTION) ×3 IMPLANT

## 2013-01-16 NOTE — Brief Op Note (Signed)
01/12/2013 - 01/16/2013  12:08 PM  PATIENT:  Lori Jensen  42 y.o. female  PRE-OPERATIVE DIAGNOSIS:  bleeding  POST-OPERATIVE DIAGNOSIS:  Hematametria  PROCEDURE:  Procedure(s): DILATATION AND EVACUATION (N/A)  SURGEON:  Surgeon(s) and Role:    * Miguel Aschoff, MD - Primary   ANESTHESIA:   IV sedation  EBL:  Total I/O In: 500 [I.V.:500] Out: 105 [Urine:30; Blood:75]  BLOOD ADMINISTERED:none  DRAINS: none   LOCAL MEDICATIONS USED:  XYLOCAINE   SPECIMEN:  Source of Specimen:  uterine currettings  DISPOSITION OF SPECIMEN:  PATHOLOGY  COUNTS:  YES  TOURNIQUET:  * No tourniquets in log *  DICTATION: .Other Dictation: Dictation Number [id number]  PLAN OF CARE: Admit to inpatient   PATIENT DISPOSITION:  PACU - hemodynamically stable.   Delay start of Pharmacological VTE agent (>24hrs) due to surgical blood loss or risk of bleeding:

## 2013-01-16 NOTE — Preoperative (Signed)
Beta Blockers   Reason not to administer Beta Blockers:Not Applicable 

## 2013-01-16 NOTE — Transfer of Care (Signed)
Immediate Anesthesia Transfer of Care Note  Patient: Lori Jensen  Procedure(s) Performed: Procedure(s): DILATATION AND EVACUATION (N/A)  Patient Location: PACU  Anesthesia Type:MAC  Level of Consciousness: sedated  Airway & Oxygen Therapy: Patient Spontanous Breathing and Patient connected to face mask oxygen  Post-op Assessment: Report given to PACU RN  Post vital signs: Reviewed  Complications: No apparent anesthesia complications

## 2013-01-16 NOTE — H&P (Signed)
  Status unchanged will proceed with dilatation and currettege to try to resolve hematometria

## 2013-01-16 NOTE — Anesthesia Preprocedure Evaluation (Signed)
Anesthesia Evaluation  Patient identified by MRN, date of birth, ID band Patient awake    Reviewed: Allergy & Precautions, H&P , NPO status , Patient's Chart, lab work & pertinent test results, reviewed documented beta blocker date and time   History of Anesthesia Complications (+) PONV  Airway Mallampati: I TM Distance: >3 FB Neck ROM: full   Comment: Pharynx red Dental  (+) Teeth Intact   Pulmonary Current Smoker (1 ppd),  C/o sore throat that started today breath sounds clear to auscultation  Pulmonary exam normal       Cardiovascular Exercise Tolerance: Good hypertension (was on HCTZ, stopped and was regulating wtih diet only), Rhythm:regular Rate:Normal  Occasional CP/SOB attributes to anxiety/panic attacks   Neuro/Psych PSYCHIATRIC DISORDERS (anxiety, panic attacks - occasional xanax) negative neurological ROS     GI/Hepatic Neg liver ROS, Reports nausea, gassy feeling this morning   Endo/Other  negative endocrine ROS  Renal/GU negative Renal ROS  Female GU complaint     Musculoskeletal   Abdominal   Peds  Hematology negative hematology ROS (+)   Anesthesia Other Findings   Reproductive/Obstetrics negative OB ROS                           Anesthesia Physical Anesthesia Plan  ASA: II  Anesthesia Plan: MAC and General ETT   Post-op Pain Management:    Induction:   Airway Management Planned:   Additional Equipment:   Intra-op Plan:   Post-operative Plan:   Informed Consent: I have reviewed the patients History and Physical, chart, labs and discussed the procedure including the risks, benefits and alternatives for the proposed anesthesia with the patient or authorized representative who has indicated his/her understanding and acceptance.   Dental Advisory Given  Plan Discussed with: CRNA and Surgeon  Anesthesia Plan Comments:         Anesthesia Quick  Evaluation

## 2013-01-16 NOTE — Anesthesia Postprocedure Evaluation (Signed)
  Anesthesia Post-op Note  Anesthesia Post Note  Patient: Lori Jensen  Procedure(s) Performed: Procedure(s) (LRB): DILATATION AND EVACUATION (N/A)  Anesthesia type: MAC  Patient location: PACU  Post pain: Pain level controlled  Post assessment: Post-op Vital signs reviewed  Last Vitals:  Filed Vitals:   01/16/13 1345  BP: 136/78  Pulse: 64  Temp: 36.7 C  Resp: 19    Post vital signs: Reviewed  Level of consciousness: sedated  Complications: No apparent anesthesia complications

## 2013-01-16 NOTE — Progress Notes (Signed)
Patient ID: Lori Jensen, female   DOB: 09-May-1971, 42 y.o.   MRN: [id number]  Stable this PM after drainage of hematometria. Will observe over night and if afebrile and comfortable will discharge home in AM 01/17/2013.

## 2013-01-17 ENCOUNTER — Encounter (HOSPITAL_COMMUNITY): Payer: Self-pay | Admitting: Obstetrics and Gynecology

## 2013-01-17 NOTE — Discharge Instructions (Signed)
Take Augmentin 875/125 twice a day for 4 days  Take Percocet as needed for pain.  For less severe pain and to avoid sedation you can use Toradol one every 6 hours if needed.  Set up follow up visit for 2 weeks

## 2013-01-17 NOTE — Progress Notes (Signed)
Pt is discharged in the care of friend Downstairs per ambulatory.Spirits are good. Denies any pain or discomfort. , heavy vaginal bleeding.Marland Kitchen Discharge instructions with Rx were given with good understanding Questions were asked and answered.Stable.

## 2013-01-17 NOTE — Op Note (Signed)
Lori Jensen, FRAME NO.:  [id number]  MEDICAL RECORD NO.:  [id number]  LOCATION:                                 FACILITY:  PHYSICIAN:  Miguel Aschoff, M.D.       DATE OF BIRTH:  08-Jul-1971  DATE OF PROCEDURE:  01/16/2013 DATE OF DISCHARGE:                              OPERATIVE REPORT   PREOPERATIVE DIAGNOSIS:  Hematometra.  POSTOPERATIVE DIAGNOSIS:  Hematometra.  PROCEDURE:  Cervical dilatation, uterine suction curettage, removal of intrauterine synechiae and blood.  SURGEON:  Miguel Aschoff, MD  ANESTHESIA:  IV sedation with paracervical block.  COMPLICATIONS:  None.  JUSTIFICATION:  The patient is a 42 year old white female, who underwent endometrial ablation.  The patient had developed severe pelvic pain, and on CT scan has an apparent collection of fluid within the uterus which seems consistent with some endometriosis felt that this may be secondary to the previous endometrial ablation and felt that this may be causing her significant pelvic pain.  Because of these findings, she is being taken to the operating room at this time to undergo cervical dilatation, uterine curettage, and evacuation of hematometra, and removal of any intrauterine synechiae.  The risks and benefits of the procedure were discussed with the patient.  PROCEDURE:  The patient was taken to the operating room, placed in supine position.  IV sedation was administered without difficulty.  She was then placed in the dorsal lithotomy position, prepped and draped in usual sterile fashion.  The bladder was then catheterized.  Examination under anesthesia revealed normal external genitalia.  Normal Bartholin's, Skene's glands.  Normal urethra.  The vaginal vault was without gross lesion.  The cervix was without gross lesion.  Uterus was noted to be globular approximately 10-12 weeks equivalent size.  No adnexal masses were noted.  At this point, a speculum was placed in the vaginal vault.   Anterior cervical lip grasped with a tenaculum and then the cervix was injected with a total of 10 mL of 1% Xylocaine by placing equal amounts at the 12, 4, and 8 o'clock positions on the cervix. After this was done, serial Pratt dilators were used to dilate the endocervical canal.  There was some initial resistance felt consistent with scarring of the lower uterine segment.  Once the scarring had been passed, the cervix was further dilated and easily admitted a #29 Pratt dilator.  Once this was done, the dilator was used to manipulate within the uterine cavity and it could be felt that there was significant scarring synechiae present.  Once the synechiae were treated, a moderate amount of old blood appeared through the cervix and this was felt to be the collection noted both on CT and MR scanning.  Following this, suction curettage was carried out to ensure that all residual blood within the uterine cavity was removed, then once again uterine dressing forceps were placed in the cavity to ensure that the cavity had no other synechiae or any other obstructions, or any collections within the uterine cavity.  At this point, it was elected to complete the procedure.  All instruments were removed.  The patient was reversed from the anesthetic and brought to recovery  room in satisfactory condition.  ESTIMATED BLOOD LOSS:  Minimal.  PLAN:  For the patient to be observed overnight and is expected that she will be discharged home on January 17, 2013.     Miguel Aschoff, M.D.     AR/MEDQ  D:  01/16/2013  T:  01/17/2013  Job:  [id number]

## 2013-01-17 NOTE — Progress Notes (Signed)
Patient ID: Lori Jensen, female   DOB: 08/20/1971, 42 y.o.   MRN: [id number]  Feeling better this AM still with some pelvic pain. She has been afebrile and stable since drainage of hematometria.   Afebrile  BP 120/75  Abdomen soft non tender  Impression. Pelvic and secondary to hamatometria and endometritis.  Plan:  Discharge home Return to clinic in two weeks Meds: Augmentin 875 BID for 4 additional days            Percocet            Toradol 10 mgs q6hr prn  Nothing per vagina  To call for worsening pain, fever or heavy bleeding

## 2013-01-17 NOTE — Anesthesia Postprocedure Evaluation (Signed)
  Anesthesia Post-op Note  Patient: Lori Jensen  Procedure(s) Performed: Procedure(s): DILATATION AND EVACUATION (N/A)  Patient Location: [facility]  Anesthesia Type:MAC  Level of Consciousness: awake  Airway and Oxygen Therapy: Patient Spontanous Breathing  Post-op Pain: mild  Post-op Assessment: Patient's Cardiovascular Status Stable and Respiratory Function Stable  Post-op Vital Signs: stable  Complications: No apparent anesthesia complications

## 2013-01-23 ENCOUNTER — Ambulatory Visit: Payer: Managed Care, Other (non HMO) | Admitting: Gastroenterology

## 2013-01-26 ENCOUNTER — Encounter (HOSPITAL_COMMUNITY): Payer: Self-pay | Admitting: Emergency Medicine

## 2013-01-26 ENCOUNTER — Emergency Department (HOSPITAL_COMMUNITY)
Admission: EM | Admit: 2013-01-26 | Discharge: 2013-01-26 | Disposition: A | Discharge status: Home / Self Care | Payer: Managed Care, Other (non HMO) | Attending: Emergency Medicine | Admitting: Emergency Medicine

## 2013-01-26 DIAGNOSIS — N898 Other specified noninflammatory disorders of vagina: Secondary | ICD-10-CM | POA: Insufficient documentation

## 2013-01-26 DIAGNOSIS — F3289 Other specified depressive episodes: Secondary | ICD-10-CM | POA: Insufficient documentation

## 2013-01-26 DIAGNOSIS — M549 Dorsalgia, unspecified: Secondary | ICD-10-CM

## 2013-01-26 DIAGNOSIS — M545 Low back pain, unspecified: Secondary | ICD-10-CM | POA: Insufficient documentation

## 2013-01-26 DIAGNOSIS — I10 Essential (primary) hypertension: Secondary | ICD-10-CM | POA: Insufficient documentation

## 2013-01-26 DIAGNOSIS — R609 Edema, unspecified: Secondary | ICD-10-CM

## 2013-01-26 DIAGNOSIS — Z8742 Personal history of other diseases of the female genital tract: Secondary | ICD-10-CM | POA: Insufficient documentation

## 2013-01-26 DIAGNOSIS — R0602 Shortness of breath: Secondary | ICD-10-CM | POA: Insufficient documentation

## 2013-01-26 DIAGNOSIS — F411 Generalized anxiety disorder: Secondary | ICD-10-CM | POA: Insufficient documentation

## 2013-01-26 DIAGNOSIS — F329 Major depressive disorder, single episode, unspecified: Secondary | ICD-10-CM | POA: Insufficient documentation

## 2013-01-26 DIAGNOSIS — F17200 Nicotine dependence, unspecified, uncomplicated: Secondary | ICD-10-CM | POA: Insufficient documentation

## 2013-01-26 DIAGNOSIS — Z79899 Other long term (current) drug therapy: Secondary | ICD-10-CM | POA: Insufficient documentation

## 2013-01-26 DIAGNOSIS — Z3202 Encounter for pregnancy test, result negative: Secondary | ICD-10-CM | POA: Insufficient documentation

## 2013-01-26 LAB — BASIC METABOLIC PANEL
Chloride: 104 mEq/L (ref 96–112)
Creatinine, Ser: 0.75 mg/dL (ref 0.50–1.10)
GFR calc Af Amer: >90 mL/min (ref >90)
Potassium: 3.6 mEq/L (ref 3.5–5.1)
Sodium: 138 mEq/L (ref 135–145)

## 2013-01-26 LAB — URINE MICROSCOPIC-ADD ON

## 2013-01-26 LAB — CBC WITH DIFFERENTIAL/PLATELET
Basophils Absolute: 0 10*3/uL (ref 0.0–0.1)
Basophils Relative: 0 % (ref 0–1)
Lymphocytes Relative: 33 % (ref 12–46)
MCHC: 34 g/dL (ref 30.0–36.0)
Neutro Abs: 4.2 10*3/uL (ref 1.7–7.7)
Neutrophils Relative %: 58 % (ref 43–77)
RDW: 13.2 % (ref 11.5–15.5)
WBC: 7.2 10*3/uL (ref 4.0–10.5)

## 2013-01-26 LAB — URINALYSIS, ROUTINE W REFLEX MICROSCOPIC
Ketones, ur: NEGATIVE mg/dL
Leukocytes, UA: NEGATIVE
Nitrite: NEGATIVE
Protein, ur: NEGATIVE mg/dL
pH: 7 (ref 5.0–8.0)

## 2013-01-26 LAB — PREGNANCY, URINE: Preg Test, Ur: NEGATIVE

## 2013-01-26 NOTE — ED Provider Notes (Signed)
History     CSN: [id number]  Arrival date & time 01/26/13  1459   First MD Initiated Contact with Patient 01/26/13 1504      Chief Complaint  Patient presents with  . Post-op Problem     HPI The patient underwent D&C on 01/16/2013.  She now presents because she reports she feels like she's had a small amount of edema in her bilateral lower extremities and has been having some new low back pain over the past several days.  She denies weakness in her lower extremities.  She has no prior history of DVT or pulmonary embolism.  She feels as though today she's had difficulty getting a "deep breath".  She denies chest pain or pleuritic chest pain.  She doesn't report true dyspnea or dyspnea with exertion.  She denies orthopnea.  She states sometimes her breathing feels like this when she gets anxious.  She denies abdominal pain.  No nausea or vomiting or diarrhea.  She still having some vaginal bleeding but reports this is slight and not heavy at all.  She's been active since the time of her surgery.  Her symptoms are mild.  She called her OB/GYN because she stated that her legs were swollen and she was having some shortness of breath they requested that she go to the emergency department.  At this time she states she feels rather well   Past Medical History  Diagnosis Date  . Hypertension   . Dysmenorrhea   . Anxiety   . Depression   . PONV (postoperative nausea and vomiting)     Past Surgical History  Procedure Laterality Date  . Dilation and curettage of uterus    . Uterine ablation  08/13/2011  . Dilation and curettage of uterus  01/16/2013    Procedure: DILATATION AND CURETTAGE;  Surgeon: Miguel Aschoff, MD;  Location: [facility];  Service: Gynecology;;    Family History  Problem Relation Age of Onset  . Heart disease Mother   . Stomach cancer Father   . Heart disease Father     History  Substance Use Topics  . Smoking status: Current Every Day Smoker -- 0.25 packs/day for 20 years     Types: Cigarettes  . Smokeless tobacco: Never Used  . Alcohol Use: 0.6 oz/week    1 Glasses of wine per week     Comment: occasionally    OB History   Grav Para Term Preterm Abortions TAB SAB Ect Mult Living   7 2 2  5 2 3   2       Review of Systems  All other systems reviewed and are negative.    Allergies  Codeine and Doxycycline  Home Medications   Current Outpatient Rx  Name  Route  Sig  Dispense  Refill  . Acetaminophen (TYLENOL PO)   Oral   Take 2 tablets by mouth 2 (two) times daily as needed (pain).         Marland Kitchen ALPRAZolam (XANAX) 0.25 MG tablet   Oral   Take 0.25 mg by mouth daily as needed. For anxiety         . clotrimazole (MYCELEX) 10 MG troche   Oral   Take 10 mg by mouth 5 (five) times daily.           BP 141/87  Pulse 61  Temp(Src) 98.4 F (36.9 C) (Oral)  Resp 18  SpO2 100%  Physical Exam  Nursing note and vitals reviewed. Constitutional: She is oriented to  person, place, and time. She appears well-developed and well-nourished. No distress.  HENT:  Head: Normocephalic and atraumatic.  Eyes: EOM are normal.  Neck: Normal range of motion.  Cardiovascular: Normal rate, regular rhythm and normal heart sounds.   Pulmonary/Chest: Effort normal and breath sounds normal.  Abdominal: Soft. She exhibits no distension. There is no tenderness.  Musculoskeletal: Normal range of motion. She exhibits no edema and no tenderness.  No unilateral leg swelling.  No significant low back tenderness  Neurological: She is alert and oriented to person, place, and time.  Skin: Skin is warm and dry.  Psychiatric: She has a normal mood and affect. Judgment normal.    ED Course  Procedures (including critical care time)  Labs Reviewed  URINALYSIS, ROUTINE W REFLEX MICROSCOPIC - Abnormal; Notable for the following:    Hgb urine dipstick LARGE (*)    All other components within normal limits  CBC WITH DIFFERENTIAL  BASIC METABOLIC PANEL  PREGNANCY, URINE   URINE MICROSCOPIC-ADD ON   No results found.   1. Back pain   2. Edema       MDM  The patient is well-appearing.  She is PERC negative.  No significant leg swelling on my examination.  Her leg swelling improved with elevation of her legs in the emergency department.  Labs and urine are normal.  No signs of pyelonephritis.  Vital signs are stable.  Discharge home in good condition.  Her shortness of breath is not true dyspnea is much is she's feels like she's having difficulty getting a deep breath.  It sounds a little but more like anxiety.  I recommended the patient return the emergency department for any new or worsening symptoms        Lyanne Co, MD 01/26/13 [phone]

## 2013-01-26 NOTE — ED Notes (Signed)
Pt had a d/c done on the 27th and has been having sob, leg swelling and not feeling well.

## 2013-01-27 NOTE — Discharge Summary (Signed)
Lori Jensen, Lori Jensen NO.:  [id number]  MEDICAL RECORD NO.:  [id number]  LOCATION:  9306                          FACILITY:  [facility]  PHYSICIAN:  Miguel Aschoff, M.D.       DATE OF BIRTH:  15-Mar-1971  DATE OF ADMISSION:  01/12/2013 DATE OF DISCHARGE:  01/17/2013                              DISCHARGE SUMMARY   ADMISSION DIAGNOSES: 1. Hematometra. 2. Pelvic pain. 3. Fever.  FINAL DIAGNOSES: 1. Hematometra. 2. Pelvic pain. 3. Fever.  OPERATIONS AND PROCEDURES:  Cervical dilatation, uterine curettage with drainage of intrauterine hematometra, lysis of intrauterine adhesions, general anesthesia.  BRIEF HISTORY:  The patient is a 42 year old white female who is complaining of severe lower abdominal pain and fever.  The patient initially was thought to have a GI etiology of her pain and was to undergo studies at the [facility].  Some of the studies involved a CT scan, which revealed a cystic mass inside the uterine cavity.  This appeared to be consistent with hematometra, which was felt to be secondary to prior endometrial ablation.  Because of the uncertainty as to the nature of the pain, the patient was admitted to the hospital on January 12, 2013.  Studies were obtained.  This included an admission hemoglobin of 12.4, white count was normal.  Urinalysis was essentially negative.  Studies for MRSA and Staph aureus were also negative.  The patient was placed on intravenous Unasyn.  She had defervescence of her fever, but continued to have severe lower abdominal pain.  An MRI scan was obtained to see if the pelvic pathology could be further elucidated. Once again, it did not appear to show any abdominal etiology for the pain, but the uterus was noted to have a loculated-appearing hemorrhagic area within it along with septums.  Because of this, the patient was taken to the operating room on January 16, 2013, at which time a D and C was carried out.  At the time of  D and C it was obvious that the patient did have a collection of blood within the endometrial cavity.  It was possible to open this cavity up with the cervical dilatation, break down the intrauterine synechiae, and drain this large collection of old blood within the confines of the uterus, which was felt to be responsible for the patient's pain.  The patient was continued on antibiotic therapy and on the day after surgery, was discharged home.  MEDICATIONS FOR HOME: 1. Augmentin 875 mg twice a day for 5 additional days. 2. Vicodin 1 every 4 hours as needed for pain.  She was instructed to call if there are any further problems with fever, pain, or heavy bleeding.  She was instructed to be seen back in the office in 2 weeks for followup examination.  She was sent home in a satisfactory condition on a regular diet.     Miguel Aschoff, M.D.     AR/MEDQ  D:  01/26/2013  T:  01/27/2013  Job:  [id number]

## 2013-03-07 ENCOUNTER — Telehealth: Payer: Self-pay | Admitting: Gastroenterology

## 2013-03-07 NOTE — Telephone Encounter (Signed)
Patient was originally scheduled for colon/endo in March for lower abdominal pain, rectal bleeding, nausea and left flank pain.  She had a hemorraghic cyst and had a D&C and the procedures were cancelled.  She is no longer having the nausea, but all the other symptoms are present.  I have rescheduled her for the endo/colon for 03/22/13.  Does she still need the endo?  No current upper GI symptoms.

## 2013-03-11 NOTE — Telephone Encounter (Signed)
Yes, given her symptoms colonoscopy only now is a good plan.

## 2013-03-12 ENCOUNTER — Ambulatory Visit (AMBULATORY_SURGERY_CENTER): Payer: Managed Care, Other (non HMO) | Admitting: *Deleted

## 2013-03-12 VITALS — Ht 66.0 in | Wt 161.6 lb

## 2013-03-12 DIAGNOSIS — R109 Unspecified abdominal pain: Secondary | ICD-10-CM

## 2013-03-12 DIAGNOSIS — K921 Melena: Secondary | ICD-10-CM

## 2013-03-12 NOTE — Progress Notes (Signed)
No egg or soy allergy. ewm No problems with sedation, just post op nausea vomiting. ewm No home 02 use. ewm

## 2013-03-12 NOTE — Telephone Encounter (Signed)
Patient aware.  Procedure changed to colon

## 2013-03-14 ENCOUNTER — Encounter: Payer: Self-pay | Admitting: Gastroenterology

## 2013-03-21 ENCOUNTER — Telehealth: Payer: Self-pay | Admitting: Gastroenterology

## 2013-03-21 NOTE — Telephone Encounter (Signed)
Please charge  

## 2013-03-22 ENCOUNTER — Encounter: Payer: Managed Care, Other (non HMO) | Admitting: Gastroenterology

## 2013-04-20 ENCOUNTER — Encounter: Payer: Managed Care, Other (non HMO) | Admitting: Gastroenterology

## 2013-08-02 ENCOUNTER — Other Ambulatory Visit: Payer: Self-pay

## 2014-06-27 ENCOUNTER — Emergency Department (HOSPITAL_BASED_OUTPATIENT_CLINIC_OR_DEPARTMENT_OTHER)
Admission: EM | Admit: 2014-06-27 | Discharge: 2014-06-27 | Disposition: A | Discharge status: Home / Self Care | Payer: Managed Care, Other (non HMO) | Attending: Emergency Medicine | Admitting: Emergency Medicine

## 2014-06-27 ENCOUNTER — Emergency Department (HOSPITAL_BASED_OUTPATIENT_CLINIC_OR_DEPARTMENT_OTHER): Payer: Managed Care, Other (non HMO)

## 2014-06-27 ENCOUNTER — Encounter (HOSPITAL_BASED_OUTPATIENT_CLINIC_OR_DEPARTMENT_OTHER): Payer: Self-pay | Admitting: Emergency Medicine

## 2014-06-27 DIAGNOSIS — R079 Chest pain, unspecified: Secondary | ICD-10-CM

## 2014-06-27 DIAGNOSIS — F329 Major depressive disorder, single episode, unspecified: Secondary | ICD-10-CM | POA: Insufficient documentation

## 2014-06-27 DIAGNOSIS — Z720 Tobacco use: Secondary | ICD-10-CM | POA: Insufficient documentation

## 2014-06-27 DIAGNOSIS — R011 Cardiac murmur, unspecified: Secondary | ICD-10-CM | POA: Diagnosis not present

## 2014-06-27 DIAGNOSIS — R0602 Shortness of breath: Secondary | ICD-10-CM | POA: Insufficient documentation

## 2014-06-27 DIAGNOSIS — R0789 Other chest pain: Secondary | ICD-10-CM

## 2014-06-27 DIAGNOSIS — F419 Anxiety disorder, unspecified: Secondary | ICD-10-CM | POA: Diagnosis not present

## 2014-06-27 DIAGNOSIS — Z8742 Personal history of other diseases of the female genital tract: Secondary | ICD-10-CM | POA: Insufficient documentation

## 2014-06-27 DIAGNOSIS — I10 Essential (primary) hypertension: Secondary | ICD-10-CM | POA: Insufficient documentation

## 2014-06-27 DIAGNOSIS — Z79899 Other long term (current) drug therapy: Secondary | ICD-10-CM | POA: Insufficient documentation

## 2014-06-27 LAB — TROPONIN I: Troponin I: <0.3 ng/mL (ref <0.30)

## 2014-06-27 LAB — BASIC METABOLIC PANEL
Anion gap: 11 (ref 5–15)
BUN: 11 mg/dL (ref 6–23)
CHLORIDE: 102 meq/L (ref 96–112)
CO2: 26 meq/L (ref 19–32)
CREATININE: 0.7 mg/dL (ref 0.50–1.10)
Calcium: 9.4 mg/dL (ref 8.4–10.5)
GFR calc non Af Amer: >90 mL/min (ref >90)
GLUCOSE: 102 mg/dL — AB (ref 70–99)
POTASSIUM: 3.6 meq/L — AB (ref 3.7–5.3)
Sodium: 139 mEq/L (ref 137–147)

## 2014-06-27 LAB — CBC WITH DIFFERENTIAL/PLATELET
BASOS PCT: 0 % (ref 0–1)
Basophils Absolute: 0 10*3/uL (ref 0.0–0.1)
EOS ABS: 0.2 10*3/uL (ref 0.0–0.7)
Eosinophils Relative: 2 % (ref 0–5)
HEMATOCRIT: 42.9 % (ref 36.0–46.0)
HEMOGLOBIN: 15 g/dL (ref 12.0–15.0)
LYMPHS ABS: 2.1 10*3/uL (ref 0.7–4.0)
Lymphocytes Relative: 27 % (ref 12–46)
MCH: 29.4 pg (ref 26.0–34.0)
MCHC: 35 g/dL (ref 30.0–36.0)
MCV: 84.1 fL (ref 78.0–100.0)
MONO ABS: 0.7 10*3/uL (ref 0.1–1.0)
MONOS PCT: 8 % (ref 3–12)
NEUTROS PCT: 63 % (ref 43–77)
Neutro Abs: 5 10*3/uL (ref 1.7–7.7)
Platelets: 209 10*3/uL (ref 150–400)
RBC: 5.1 MIL/uL (ref 3.87–5.11)
RDW: 13 % (ref 11.5–15.5)
WBC: 7.9 10*3/uL (ref 4.0–10.5)

## 2014-06-27 LAB — URINALYSIS, ROUTINE W REFLEX MICROSCOPIC
Bilirubin Urine: NEGATIVE
GLUCOSE, UA: NEGATIVE mg/dL
HGB URINE DIPSTICK: NEGATIVE
KETONES UR: NEGATIVE mg/dL
LEUKOCYTES UA: NEGATIVE
Nitrite: NEGATIVE
PH: 7 (ref 5.0–8.0)
PROTEIN: NEGATIVE mg/dL
Specific Gravity, Urine: 1.003 — ABNORMAL LOW (ref 1.005–1.030)
Urobilinogen, UA: 0.2 mg/dL (ref 0.0–1.0)

## 2014-06-27 MED ORDER — OMEPRAZOLE 20 MG PO CPDR: 20.0000 mg | DELAYED_RELEASE_CAPSULE | Freq: Every day | ORAL | Status: DC

## 2014-06-27 MED ORDER — GI COCKTAIL ~~LOC~~
30.0000 mL | Freq: Once | ORAL | Status: AC
  Administered 2014-06-27: 30 mL via ORAL
  Filled 2014-06-27: qty 30

## 2014-06-27 NOTE — ED Provider Notes (Signed)
Medical screening examination/treatment/procedure(s) were performed by non-physician practitioner and as supervising physician I was immediately available for consultation/collaboration.   EKG Interpretation   Date/Time:  Thursday June 27 2014 11:34:43 EDT Ventricular Rate:  78 PR Interval:  132 QRS Duration: 80 QT Interval:  392 QTC Calculation: 446 R Axis:   54 Text Interpretation:  Normal sinus rhythm Normal ECG Confirmed by Ezana Hubbert   MD, Devanee Pomplun ([id number]) on 06/27/2014 3:43:15 PM        Ernestina Patches, MD 06/27/14 [id number]

## 2014-06-27 NOTE — ED Notes (Signed)
Pt to ed co chest pain with SOB. Pt has Hx of mitral valve prolapse, HTN ,anxiety, and heart murmur. Chest pain radiating to the left and back. Pt denies nausea yet feels lightheaded. Pt alert and oriented. Pt did not take her morning med for HTN.

## 2014-06-27 NOTE — Discharge Instructions (Signed)

## 2014-06-27 NOTE — ED Provider Notes (Signed)
CSN: [id number]     Arrival date & time 06/27/14  1125 History   First MD Initiated Contact with Patient 06/27/14 1234     Chief Complaint  Patient presents with  . Chest Pain  . Shortness of Breath     (Consider location/radiation/quality/duration/timing/severity/associated sxs/prior Treatment) Patient is a 43 y.o. female presenting with chest pain. The history is provided by the patient. No language interpreter was used.  Chest Pain Pain location:  L chest Pain quality: aching   Pain radiates to:  Does not radiate Pain radiates to the back: no   Pain severity:  No pain Timing:  Constant Progression:  Worsening Chronicity:  New Context: not breathing, no movement, no stress and no trauma   Relieved by:  Nothing Worsened by:  Nothing tried Ineffective treatments:  None tried Associated symptoms: no abdominal pain   Risk factors: no smoking     Past Medical History  Diagnosis Date  . Hypertension   . Dysmenorrhea   . Anxiety   . Depression   . PONV (postoperative nausea and vomiting)   . Heart murmur    Past Surgical History  Procedure Laterality Date  . Dilation and curettage of uterus    . Uterine ablation  08/13/2011  . Dilation and curettage of uterus  01/16/2013    Procedure: DILATATION AND CURETTAGE;  Surgeon: Gus Height, MD;  Location: [facility];  Service: Gynecology;;   Family History  Problem Relation Age of Onset  . Heart disease Mother   . Stomach cancer Father   . Heart disease Father   . Colon cancer Neg Hx   . Other Father     non hodgkins lymphoma   History  Substance Use Topics  . Smoking status: Current Every Day Smoker -- 0.25 packs/day for 20 years    Types: Cigarettes  . Smokeless tobacco: Never Used  . Alcohol Use: 0.6 oz/week    1 Glasses of wine per week     Comment: occasionally   OB History   Grav Para Term Preterm Abortions TAB SAB Ect Mult Living   7 2 2  5 2 3   2      Review of Systems  Cardiovascular: Positive for chest  pain.  Gastrointestinal: Negative for abdominal pain.  All other systems reviewed and are negative.     Allergies  Codeine and Doxycycline  Home Medications   Prior to Admission medications   Medication Sig Start Date End Date Taking? Authorizing Provider  Acetaminophen (TYLENOL PO) Take 2 tablets by mouth 2 (two) times daily as needed (pain).    Historical Provider, MD  ALPRAZolam Duanne Moron) 0.25 MG tablet Take 0.25 mg by mouth daily as needed. For anxiety    Historical Provider, MD  hydrochlorothiazide (HYDRODIURIL) 25 MG tablet  01/31/13   Historical Provider, MD   BP 140/95  Pulse 79  Temp(Src) 98.2 F (36.8 C) (Oral)  Resp 16  Ht 5\' 6"  (1.676 m)  Wt 182 lb (82.555 kg)  BMI 29.39 kg/m2  SpO2 100% Physical Exam  Nursing note and vitals reviewed. Constitutional: She is oriented to person, place, and time. She appears well-developed and well-nourished.  HENT:  Head: Normocephalic and atraumatic.  Mouth/Throat: Oropharynx is clear and moist.  Eyes: EOM are normal. Pupils are equal, round, and reactive to light.  Neck: Normal range of motion.  Cardiovascular: Normal rate, regular rhythm and normal heart sounds.   Pulmonary/Chest: Effort normal and breath sounds normal.  Abdominal: Soft. She exhibits no  distension.  Musculoskeletal: Normal range of motion.  Neurological: She is alert and oriented to person, place, and time.  Psychiatric: She has a normal mood and affect.    ED Course  Procedures (including critical care time) Labs Review Labs Reviewed  BASIC METABOLIC PANEL - Abnormal; Notable for the following:    Potassium 3.6 (*)    Glucose, Bld 102 (*)    All other components within normal limits  CBC WITH DIFFERENTIAL  TROPONIN I    Imaging Review Dg Chest 2 View  06/27/2014   CLINICAL DATA:  Three-day history and shortness of breath and left-sided chest pain; hypertension  EXAM: CHEST  2 VIEW  COMPARISON:  June 28, 2012  FINDINGS: Lungs are clear. Heart size  and pulmonary vascularity are normal. No adenopathy. No pneumothorax. No bone lesions.  IMPRESSION: No abnormality noted.   Electronically Signed   By: Lowella Grip M.D.   On: 06/27/2014 12:32     EKG Interpretation None      MDM  Labs normal.  Normal troponin   Final diagnoses:  Chest pain of unknown etiology    I will treat with prilosec.   Pt advised recheck in 1 week.   I doubt cardiac or pulmonary cause of pain    Fransico Meadow, PA-C 06/27/14 [place], [place] 06/27/14 1455

## 2014-07-29 ENCOUNTER — Encounter (HOSPITAL_BASED_OUTPATIENT_CLINIC_OR_DEPARTMENT_OTHER): Payer: Self-pay | Admitting: Emergency Medicine

## 2014-09-10 ENCOUNTER — Encounter (HOSPITAL_BASED_OUTPATIENT_CLINIC_OR_DEPARTMENT_OTHER): Payer: Self-pay

## 2014-09-10 ENCOUNTER — Emergency Department (HOSPITAL_BASED_OUTPATIENT_CLINIC_OR_DEPARTMENT_OTHER)
Admission: EM | Admit: 2014-09-10 | Discharge: 2014-09-10 | Disposition: A | Discharge status: Home / Self Care | Payer: Managed Care, Other (non HMO) | Attending: Emergency Medicine | Admitting: Emergency Medicine

## 2014-09-10 ENCOUNTER — Emergency Department (HOSPITAL_BASED_OUTPATIENT_CLINIC_OR_DEPARTMENT_OTHER): Payer: Managed Care, Other (non HMO)

## 2014-09-10 DIAGNOSIS — Z8742 Personal history of other diseases of the female genital tract: Secondary | ICD-10-CM | POA: Insufficient documentation

## 2014-09-10 DIAGNOSIS — F329 Major depressive disorder, single episode, unspecified: Secondary | ICD-10-CM | POA: Insufficient documentation

## 2014-09-10 DIAGNOSIS — M791 Myalgia, unspecified site: Secondary | ICD-10-CM

## 2014-09-10 DIAGNOSIS — R059 Cough, unspecified: Secondary | ICD-10-CM

## 2014-09-10 DIAGNOSIS — R110 Nausea: Secondary | ICD-10-CM | POA: Diagnosis not present

## 2014-09-10 DIAGNOSIS — F419 Anxiety disorder, unspecified: Secondary | ICD-10-CM | POA: Diagnosis not present

## 2014-09-10 DIAGNOSIS — R05 Cough: Secondary | ICD-10-CM | POA: Diagnosis not present

## 2014-09-10 DIAGNOSIS — R0602 Shortness of breath: Secondary | ICD-10-CM | POA: Diagnosis not present

## 2014-09-10 DIAGNOSIS — I10 Essential (primary) hypertension: Secondary | ICD-10-CM | POA: Diagnosis not present

## 2014-09-10 DIAGNOSIS — Z720 Tobacco use: Secondary | ICD-10-CM | POA: Diagnosis not present

## 2014-09-10 DIAGNOSIS — Z79899 Other long term (current) drug therapy: Secondary | ICD-10-CM | POA: Diagnosis not present

## 2014-09-10 DIAGNOSIS — R52 Pain, unspecified: Secondary | ICD-10-CM | POA: Diagnosis present

## 2014-09-10 DIAGNOSIS — R197 Diarrhea, unspecified: Secondary | ICD-10-CM | POA: Insufficient documentation

## 2014-09-10 DIAGNOSIS — R011 Cardiac murmur, unspecified: Secondary | ICD-10-CM | POA: Insufficient documentation

## 2014-09-10 LAB — CBC WITH DIFFERENTIAL/PLATELET
Basophils Absolute: 0 10*3/uL (ref 0.0–0.1)
Basophils Relative: 0 % (ref 0–1)
EOS PCT: 1 % (ref 0–5)
Eosinophils Absolute: 0.2 10*3/uL (ref 0.0–0.7)
HEMATOCRIT: 39.8 % (ref 36.0–46.0)
HEMOGLOBIN: 13.5 g/dL (ref 12.0–15.0)
LYMPHS PCT: 30 % (ref 12–46)
Lymphs Abs: 4.5 10*3/uL — ABNORMAL HIGH (ref 0.7–4.0)
MCH: 29 pg (ref 26.0–34.0)
MCHC: 33.9 g/dL (ref 30.0–36.0)
MCV: 85.4 fL (ref 78.0–100.0)
MONO ABS: 0.9 10*3/uL (ref 0.1–1.0)
MONOS PCT: 6 % (ref 3–12)
NEUTROS ABS: 9.3 10*3/uL — AB (ref 1.7–7.7)
Neutrophils Relative %: 63 % (ref 43–77)
Platelets: 235 10*3/uL (ref 150–400)
RBC: 4.66 MIL/uL (ref 3.87–5.11)
RDW: 13.3 % (ref 11.5–15.5)
WBC: 14.9 10*3/uL — AB (ref 4.0–10.5)

## 2014-09-10 LAB — BASIC METABOLIC PANEL
Anion gap: 10 (ref 5–15)
BUN: 9 mg/dL (ref 6–23)
CALCIUM: 8.6 mg/dL (ref 8.4–10.5)
CHLORIDE: 103 meq/L (ref 96–112)
CO2: 28 meq/L (ref 19–32)
CREATININE: 0.8 mg/dL (ref 0.50–1.10)
GFR calc Af Amer: >90 mL/min (ref >90)
GFR calc non Af Amer: 90 mL/min — ABNORMAL LOW (ref >90)
Glucose, Bld: 93 mg/dL (ref 70–99)
Potassium: 3.8 mEq/L (ref 3.7–5.3)
Sodium: 141 mEq/L (ref 137–147)

## 2014-09-10 MED ORDER — SODIUM CHLORIDE 0.9 % IV BOLUS (SEPSIS)
1000.0000 mL | Freq: Once | INTRAVENOUS | Status: AC
  Administered 2014-09-10: 1000 mL via INTRAVENOUS

## 2014-09-10 MED ORDER — KETOROLAC TROMETHAMINE 60 MG/2ML IM SOLN: 60.0000 mg | Freq: Once | INTRAMUSCULAR | Status: DC

## 2014-09-10 MED ORDER — KETOROLAC TROMETHAMINE 30 MG/ML IJ SOLN
30.0000 mg | Freq: Once | INTRAMUSCULAR | Status: AC
  Administered 2014-09-10: 30 mg via INTRAVENOUS
  Filled 2014-09-10: qty 1

## 2014-09-10 NOTE — Discharge Instructions (Signed)

## 2014-09-10 NOTE — ED Provider Notes (Signed)
CSN: [id number]     Arrival date & time 09/10/14  1252 History   First MD Initiated Contact with Patient 09/10/14 1454     Chief Complaint  Patient presents with  . Generalized Body Aches     (Consider location/radiation/quality/duration/timing/severity/associated sxs/prior Treatment) Patient is a 43 y.o. female presenting with general illness. The history is provided by the patient. No language interpreter was used.  Illness Location:  Generalized Severity:  Severe Onset quality:  Gradual Duration:  1 day Timing:  Constant Progression:  Worsening Chronicity:  New Context:  Was diagnosed 6 days ago with bronchitis/URI and was placed on Levaquin and 5 day course of prednisone.  Relieved by:  She states she initially felt improved after the above medications but yesterday started feeling unwell again Ineffective treatments:  Levaquin, prednisone, Tessalon, albuterol Associated symptoms: cough, diarrhea, fatigue, fever, myalgias, nausea and shortness of breath   Associated symptoms: no abdominal pain, no chest pain, no congestion, no headaches, no rhinorrhea, no sore throat and no vomiting     Past Medical History  Diagnosis Date  . Hypertension   . Dysmenorrhea   . Anxiety   . Depression   . PONV (postoperative nausea and vomiting)   . Heart murmur    Past Surgical History  Procedure Laterality Date  . Dilation and curettage of uterus    . Uterine ablation  08/13/2011  . Dilation and curettage of uterus  01/16/2013    Procedure: DILATATION AND CURETTAGE;  Surgeon: Gus Height, MD;  Location: [facility];  Service: Gynecology;;   Family History  Problem Relation Age of Onset  . Heart disease Mother   . Stomach cancer Father   . Heart disease Father   . Colon cancer Neg Hx   . Other Father     non hodgkins lymphoma   History  Substance Use Topics  . Smoking status: Current Every Day Smoker -- 0.25 packs/day for 20 years    Types: Cigarettes  . Smokeless tobacco: Never  Used  . Alcohol Use: 0.6 oz/week    1 Glasses of wine per week     Comment: occasionally   OB History    Gravida Para Term Preterm AB TAB SAB Ectopic Multiple Living   7 2 2  5 2 3   2      Review of Systems  Constitutional: Positive for fever and fatigue. Negative for chills, diaphoresis, activity change and appetite change.  HENT: Negative for congestion, facial swelling, rhinorrhea and sore throat.   Eyes: Negative for photophobia and discharge.  Respiratory: Positive for cough and shortness of breath. Negative for chest tightness.   Cardiovascular: Negative for chest pain, palpitations and leg swelling.  Gastrointestinal: Positive for nausea and diarrhea. Negative for vomiting and abdominal pain.  Endocrine: Negative for polydipsia and polyuria.  Genitourinary: Negative for dysuria, frequency, difficulty urinating and pelvic pain.  Musculoskeletal: Positive for myalgias. Negative for back pain, arthralgias, neck pain and neck stiffness.  Skin: Negative for color change and wound.  Allergic/Immunologic: Negative for immunocompromised state.  Neurological: Negative for facial asymmetry, weakness, numbness and headaches.  Hematological: Does not bruise/bleed easily.  Psychiatric/Behavioral: Negative for confusion and agitation.      Allergies  Codeine and Doxycycline  Home Medications   Prior to Admission medications   Medication Sig Start Date End Date Taking? Authorizing Provider  ALBUTEROL IN Inhale into the lungs.   Yes Historical Provider, MD  Benzonatate (TESSALON PO) Take by mouth.   Yes Historical Provider, MD  Acetaminophen (TYLENOL PO) Take 2 tablets by mouth 2 (two) times daily as needed (pain).    Historical Provider, MD  ALPRAZolam Duanne Moron) 0.25 MG tablet Take 0.25 mg by mouth daily as needed. For anxiety    Historical Provider, MD  hydrochlorothiazide (HYDRODIURIL) 25 MG tablet  01/31/13   Historical Provider, MD   BP 139/87 mmHg  Pulse 66  Temp(Src) 98.3 F  (36.8 C) (Oral)  Resp 16  Ht 5\' 6"  (1.676 m)  Wt 182 lb (82.555 kg)  BMI 29.39 kg/m2  SpO2 99% Physical Exam  Constitutional: She is oriented to person, place, and time. She appears well-developed and well-nourished. No distress.  HENT:  Head: Normocephalic and atraumatic.  Mouth/Throat: No oropharyngeal exudate.  Eyes: Pupils are equal, round, and reactive to light.  Neck: Normal range of motion. Neck supple.  Cardiovascular: Normal rate, regular rhythm and normal heart sounds.  Exam reveals no gallop and no friction rub.   No murmur heard. Pulmonary/Chest: Effort normal and breath sounds normal. No respiratory distress. She has no wheezes. She has no rales.  Abdominal: Soft. Bowel sounds are normal. She exhibits no distension and no mass. There is no tenderness. There is no rebound and no guarding.  Musculoskeletal: Normal range of motion. She exhibits no edema or tenderness.  Generalized tenderness to muscle groups  Neurological: She is alert and oriented to person, place, and time.  Skin: Skin is warm and dry.  Psychiatric: She has a normal mood and affect.    ED Course  Procedures (including critical care time) Labs Review Labs Reviewed  CBC WITH DIFFERENTIAL - Abnormal; Notable for the following:    WBC 14.9 (*)    Neutro Abs 9.3 (*)    Lymphs Abs 4.5 (*)    All other components within normal limits  BASIC METABOLIC PANEL - Abnormal; Notable for the following:    GFR calc non Af Amer 90 (*)    All other components within normal limits    Imaging Review Dg Chest 2 View  09/10/2014   CLINICAL DATA:  Cough for 1 week  EXAM: CHEST  2 VIEW  COMPARISON:  June 27, 2014  FINDINGS: Lungs are clear. Heart size and pulmonary vascularity are normal. No adenopathy. No bone lesions.  IMPRESSION: No edema or consolidation.   Electronically Signed   By: Lowella Grip M.D.   On: 09/10/2014 15:37     EKG Interpretation   Date/Time:  Tuesday September 10 2014 13:02:54  EST Ventricular Rate:  85 PR Interval:  122 QRS Duration: 78 QT Interval:  372 QTC Calculation: 442 R Axis:   54 Text Interpretation:  Normal sinus rhythm Normal ECG No significant change  since last tracing Confirmed by ZACKOWSKI  MD, SCOTT [phone]) on 09/10/2014  2:08:08 PM      MDM   Final diagnoses:  Cough  Myalgia    Pt is a 43 y.o. female with Pmhx as above who presents with worsening generalized body aches since being treated with Levaquin and prednisone for what was diagnosed as a bronchitis/URI last Wednesday, 6 days ago.  She states that she has pain in her entire body including with light touch and has also experienced some palpitations.  She reports having normal appetite and drinking plenty of liquids.  She's had nausea but no vomiting.  She has had a dry cough, which has continued since last week.  She's had some loose stools.  No urinary symptoms.  She's had subjective fevers and chills since  last night .On physical exam vital signs are stable and she is in no acute distress.  Cardiopulmonary exam is benign.  She is generalized tenderness of her chest wall, abdomen, back.  Given reported worsening symptoms after diagnosis of a URI, chest x-ray ordered to rule out secondary pneumonia and was normal.  CBC and BMP with elevated WBC, otherwise unremarkable. I suspect acute viral process, perhaps secondary influenza like illness after initial URI.   She felt somewhat improved after Toradol & IVF.  She has no risk factors for rhabdo. Will rec continued antipyretic use and PO hydration. Marlowe Kays evaluation in the Emergency Department is complete. It has been determined that no acute conditions requiring further emergency intervention are present at this time. The patient/guardian have been advised of the diagnosis and plan. We have discussed signs and symptoms that warrant return to the ED, such as changes or worsening in symptoms, worsening pain, inability to  tolerate liquids, SOB.      Ernestina Patches, MD 09/12/14 [phone]

## 2014-09-10 NOTE — ED Notes (Addendum)
Pt was treated for bronchitis and URI last week-rx levaquin and prednisone-here today for generalized body aches and states "heart palpitations"

## 2014-09-12 ENCOUNTER — Encounter (HOSPITAL_COMMUNITY): Payer: Self-pay | Admitting: Emergency Medicine

## 2014-09-12 ENCOUNTER — Emergency Department (HOSPITAL_COMMUNITY)
Admission: EM | Admit: 2014-09-12 | Discharge: 2014-09-13 | Disposition: A | Discharge status: Home / Self Care | Payer: Managed Care, Other (non HMO) | Attending: Emergency Medicine | Admitting: Emergency Medicine

## 2014-09-12 DIAGNOSIS — Z79899 Other long term (current) drug therapy: Secondary | ICD-10-CM | POA: Insufficient documentation

## 2014-09-12 DIAGNOSIS — I10 Essential (primary) hypertension: Secondary | ICD-10-CM | POA: Insufficient documentation

## 2014-09-12 DIAGNOSIS — R05 Cough: Secondary | ICD-10-CM | POA: Diagnosis present

## 2014-09-12 DIAGNOSIS — Z7951 Long term (current) use of inhaled steroids: Secondary | ICD-10-CM | POA: Insufficient documentation

## 2014-09-12 DIAGNOSIS — M791 Myalgia: Secondary | ICD-10-CM | POA: Insufficient documentation

## 2014-09-12 DIAGNOSIS — Z720 Tobacco use: Secondary | ICD-10-CM | POA: Diagnosis not present

## 2014-09-12 DIAGNOSIS — F419 Anxiety disorder, unspecified: Secondary | ICD-10-CM | POA: Diagnosis not present

## 2014-09-12 DIAGNOSIS — R011 Cardiac murmur, unspecified: Secondary | ICD-10-CM | POA: Diagnosis not present

## 2014-09-12 DIAGNOSIS — B349 Viral infection, unspecified: Secondary | ICD-10-CM | POA: Diagnosis not present

## 2014-09-12 DIAGNOSIS — F329 Major depressive disorder, single episode, unspecified: Secondary | ICD-10-CM | POA: Diagnosis not present

## 2014-09-12 DIAGNOSIS — Z8742 Personal history of other diseases of the female genital tract: Secondary | ICD-10-CM | POA: Insufficient documentation

## 2014-09-12 DIAGNOSIS — R52 Pain, unspecified: Secondary | ICD-10-CM

## 2014-09-12 LAB — BASIC METABOLIC PANEL
Anion gap: 12 (ref 5–15)
BUN: 9 mg/dL (ref 6–23)
CALCIUM: 9.1 mg/dL (ref 8.4–10.5)
CO2: 25 mEq/L (ref 19–32)
CREATININE: 0.71 mg/dL (ref 0.50–1.10)
Chloride: 105 mEq/L (ref 96–112)
GFR calc Af Amer: >90 mL/min (ref >90)
GLUCOSE: 91 mg/dL (ref 70–99)
Potassium: 4.1 mEq/L (ref 3.7–5.3)
Sodium: 142 mEq/L (ref 137–147)

## 2014-09-12 LAB — CBC
HEMATOCRIT: 41.4 % (ref 36.0–46.0)
HEMOGLOBIN: 14.2 g/dL (ref 12.0–15.0)
MCH: 29.5 pg (ref 26.0–34.0)
MCHC: 34.3 g/dL (ref 30.0–36.0)
MCV: 85.9 fL (ref 78.0–100.0)
Platelets: 221 10*3/uL (ref 150–400)
RBC: 4.82 MIL/uL (ref 3.87–5.11)
RDW: 13.2 % (ref 11.5–15.5)
WBC: 12.2 10*3/uL — ABNORMAL HIGH (ref 4.0–10.5)

## 2014-09-12 LAB — I-STAT TROPONIN, ED: Troponin i, poc: 0 ng/mL (ref 0.00–0.08)

## 2014-09-12 NOTE — ED Notes (Signed)
MD Schlossman at bedside. 

## 2014-09-12 NOTE — Discharge Instructions (Signed)
°Emergency Department Resource Guide °1) Find a Doctor and Pay Out of Pocket °Although you won't have to find out who is covered by your insurance plan, it is a good idea to ask around and get recommendations. You will then need to call the office and see if the doctor you have chosen will accept you as a new patient and what types of options they offer for patients who are self-pay. Some doctors offer discounts or will set up payment plans for their patients who do not have insurance, but you will need to ask so you aren't surprised when you get to your appointment. ° °2) Contact Your Local Health Department °Not all health departments have doctors that can see patients for sick visits, but many do, so it is worth a call to see if yours does. If you don't know where your local health department is, you can check in your phone book. The CDC also has a tool to help you locate your state's health department, and many state websites also have listings of all of their local health departments. ° °3) Find a Walk-in Clinic °If your illness is not likely to be very severe or complicated, you may want to try a walk in clinic. These are popping up all over the country in pharmacies, drugstores, and shopping centers. They're usually staffed by nurse practitioners or physician assistants that have been trained to treat common illnesses and complaints. They're usually fairly quick and inexpensive. However, if you have serious medical issues or chronic medical problems, these are probably not your best option. ° °No Primary Care Doctor: °- Call [facility] at  832-8000 - they can help you locate a primary care doctor that  accepts your insurance, provides certain services, etc. °- Physician Referral Service- 1-800-533-3463 ° °Chronic Pain Problems: °Organization         Address  Phone   Notes  °[facility]  (336) 297-2271 Patients need to be referred by their primary care doctor.  ° °Medication  Assistance: °Organization         Address  Phone   Notes  °[place] [facility] [address] °[place], [place] [address] (336) 641-8030 --Must be a resident of [place] °-- Must have NO insurance coverage whatsoever (no Medicaid/ Medicare, etc.) °-- The pt. MUST have a primary care doctor that directs their care regularly and follows them in the community °  °[facility]  (866) 331-1348   °[employer]  (888) 892-1162   ° °Agencies that provide inexpensive medical care: °Organization         Address  Phone   Notes  °[facility]  (336) 832-8035   °[facility]    (336) 832-7272   °[facility] [address] °[place], [place] [address] (336) 832-4777   °[facility] [address], °[place] (336) 271-4999   °[facility]    (336) 373-0678   °[facility]    (336) 272-1050   °[facility] ° [address], [place] Phone:  (336) 832-4444, Fax:  (336) 832-4440 Hours of Operation:  9 am - 6 pm, M-F.  Also accepts Medicaid/Medicare and self-pay.  °[facility] ° [address], [place] Phone: (336) 832-3150, Fax: (336) 832-3151. Hours of Operation:  8:30 am - 5:30 pm, M-F.  Also accepts Medicaid and self-pay.  °[facility] [place] [address], [place] Phone: (336) 878-6027   °[facility] [address], [place], [place] (336)723-1848, Ext. 123 Mondays & Thursdays: 7-9 AM.  First 15 patients are seen on a first come, first serve basis. °  ° °Medicaid-accepting [place] Providers: ° °Organization         Address  Phone   Notes  °[facility] [address], [place] (336) 641-2100 Also accepts self-pay patients.  °[facility] [address], [place] ° (336) 856-9996   °[facility] [address], [place]  (336) 288-8857   °[facility] [address] [place] [address], [place] (336) 299-7000   °Veita Bland [address], [place]  ° (336) 373-1557 Only accepts [place] Access Medicaid patients after they have their name applied to their card.  ° °Self-Pay (no insurance) in [place]: ° °Organization         Address  Phone   Notes  °Sickle Cell Patients, [facility] [address], [place] (336) 832-1970   °[facility] [address], [place] (336) 832-4400   °[facility] ° [address], [place] (336) 992-4800   °[facility]/Dr. Osei-Bonsu ° [address] [place] [address], [place] or [address], [place] (336) 841-8500 Phone number for both [place] and [place] locations is the same.  °[facility] [address], [place] (336) 299-0000   °[facility] [address] [place] [address], [place] or [address] (336) 852-7530 °(336) 878-2260   °[facility] [address], [place] (336) 350-1642, phone; (336) 294-5005, fax Sees patients 1st and 3rd Saturday of every month.  Must not qualify for public or private insurance (i.e. Medicaid, Medicare, [place] Health Choice, Veterans' Benefits) • Household income should be no more than 200% of the poverty level •The clinic cannot treat you if you are pregnant or think you are pregnant • Sexually transmitted diseases are not treated at the clinic.  ° ° °Dental Care: °Organization         Address  Phone  Notes  °[place] [facility] [address], [place] (336) 641-6152 Accepts children up to age 21 who are enrolled in Medicaid or [place] Health Choice; pregnant women with a Medicaid card; and children who have applied for Medicaid or [place] Health Choice, but were declined, whose parents can pay a reduced fee at time of service.  °[place]  [facility] [place]  [address], [place] (336) 641-7733 Accepts children up to age 21 who are enrolled in Medicaid or [place] Health Choice; pregnant women with a Medicaid card; and children who have applied for Medicaid or  Health Choice, but were declined, whose parents can pay a reduced fee at time of service.  °[facility] ° [address],  (336) 641-4533 Patients are seen by appointment only. Walk-ins are not accepted. [facility] will see patients 18 years of age and older. °Monday - Tuesday (8am-5pm) °Most Wednesdays (8:30-5pm) °$30 per visit, cash only  °[facility] ° [address], [place] (336) 641-4533 Patients are seen by appointment only. Walk-ins are not accepted. [facility] will see patients 18 years of age and older. °One   Wednesday Evening (Monthly: Volunteer Based).  $30 per visit, cash only  °[facility]  (919) 537-3737 for adults; Children under age 4, call Graduate Pediatric Dentistry at (919) [phone]. Children aged 4-14, please call (919) [phone] to request a pediatric application. ° Dental services are provided in all areas of dental care including fillings, crowns and bridges, complete and partial dentures, implants, gum treatment, root canals, and extractions. Preventive care is also provided. Treatment is provided to both adults and children. °Patients are selected via a lottery and there is often a waiting list. °  °[facility] [address], °[place] ° (336) 763-8833 [email or web address] °  °[facility] [address], [place], [place] (336)723-1848, Ext. 123 Second and Fourth Thursday of each month, opens at 6:30 AM; Clinic ends at 9 AM.  Patients are seen on a first-come first-served basis, and a limited number are seen during each clinic.  ° °[facility] ° [address], [place], [place] (336) 723-7904    Eligibility Requirements °You must have lived in [place], [place], or [place] counties for at least the last three months. °  You cannot be eligible for state or federal sponsored healthcare insurance, including Veterans Administration, Medicaid, or Medicare. °  You generally cannot be eligible for healthcare insurance through your employer.  °  How to apply: °Eligibility screenings are held every Tuesday and Wednesday afternoon from 1:00 pm until 4:00 pm. You do not need an appointment for the interview!  °[facility] [address], [place], [place] 336-631-2330   °[facility]  336-342-8273   °[facility]  336-703-3100   °[facility]  336-570-6415   ° °Behavioral Health Resources in the Community: °Intensive Outpatient Programs °Organization         Address  Phone  Notes  °[facility] [address], [place], [place] 336-878-6098   °[facility] [address], [place], [place] 336-832-9800   °[facility] [address], [place], [place] ° 336-882-2125   °[facility] [address],  °[place], [place] 1-800-853-5163 or 336-641-4981   °Substance Abuse Resources °Organization         Address  Phone  Notes  °[facility]  336-882-2125   °[facility]  336-784-9470   °[facility]  336-285-9073   °[facility]  336-845-3988   °Residential & Outpatient Substance Abuse Program  1-800-659-3381   °Psychological Services °Organization         Address  Phone  Notes  °[facility]  336- 832-9600   °[facility]  336- 378-7881   °[facility] [address], [place] 1-800-853-5163 or 336-641-4981   ° °Mobile Crisis Teams °Organization         Address  Phone  Notes  °[facility], Mobile Crisis Care Unit  1-877-626-1772   °Assertive °[facility] ° [address]  , [place] 336-834-9664   °Sharon DeEsch [address] °  336-554-5454   ° °Self-Help/Support Groups °Organization         Address  Phone             Notes  °[facility]  - variety of support groups  336- 373-1402 Call for more information  °Narcotics Anonymous (NA), [facility] [address], °[place]   2 meetings at this location  ° °  Residential Treatment Programs °Organization         Address  Phone  Notes  °[facility] [address],    °[place]  1-866-801-8205   °[facility] ° [address], [place], [place] 704-293-8524   °[facility] [address], [place] 336-845-3988 Admissions: 8am-3pm M-F  °[facility] [address]    °[place], [place] 336-841-1104   °[facility] [address], [place], [place] 336-379-7146   °[facility] [address]  °[place], [place] 336-285-9073   °[facility] Intensive Outpatient [address], [place], [place] 336-852-3033   °[facility] [address]  °[place], [place] 1-877-615-2722 or 336-784-9470   °[facility] [address] [place], [place] 336-227-7417 Accepts Medicaid  °[facility] [address]  °[place] 1-800-659-3381 Substance Abuse/Addiction Treatment  ° °[place] Behavioral Health Resources °Organization         Address  Phone  Notes  °[facility]  (888) 581-9988   °Julie Brannon, PhD [address] [place], [place]   (336) 349-5553 or (336) 951-0000   °[facility]   [address] °[place], [place] (336) 349-4454   °[facility] [address], [place], [place] (336) 342-8316 Insurance/Medicaid/sponsorship through [facility]  °[facility] [address]                                    [place], [place] (336) 342-8316 Therapy/tele-psych/case    °[facility] [address]  ° [place], [place] (336) 349-2233    °Dr. Arfeen  (336) 349-4544   °[facility] [place]  [employer] [place] [facility] 1) [address], [place] °2) [address], [place] °3)  [address], [place] (336) 349-3220 °(336) 342-7768 ° °(336) 342-8140   °[place] [facility] (336) 342-1394 or (336) 342-3537 (After Hours)    ° ° °

## 2014-09-12 NOTE — ED Notes (Signed)
C/o cough, chills, body aches, CP X1 week, no V/D, A/O X4, ambulatory and in NAD

## 2014-09-13 NOTE — ED Provider Notes (Signed)
CSN: [id number]     Arrival date & time 09/12/14  2154 History   First MD Initiated Contact with Patient 09/12/14 2210     Chief Complaint  Patient presents with  . Cough  . Chest Pain     (Consider location/radiation/quality/duration/timing/severity/associated sxs/prior Treatment) Patient is a 43 y.o. female presenting with cough and chest pain.  Cough Cough characteristics:  Non-productive Severity:  Mild Onset quality:  Gradual Duration:  8 days Progression:  Partially resolved Chronicity:  New Smoker: yes   Relieved by: took steroid and levoquin for 5 days. Worsened by:  Nothing tried Ineffective treatments:  None tried Associated symptoms: chest pain (with coughing,, palpation), fever (not for last 2 days however had 3 days of fever up to 101), myalgias, rhinorrhea, sinus congestion and sore throat   Associated symptoms: no headaches, no rash, no shortness of breath and no wheezing   Chest Pain Associated symptoms: cough (mild continuing), fatigue and fever (not for last 2 days however had 3 days of fever up to 101)   Associated symptoms: no abdominal pain, no back pain, no headache, no nausea, no shortness of breath and not vomiting     Past Medical History  Diagnosis Date  . Hypertension   . Dysmenorrhea   . Anxiety   . Depression   . PONV (postoperative nausea and vomiting)   . Heart murmur    Past Surgical History  Procedure Laterality Date  . Dilation and curettage of uterus    . Uterine ablation  08/13/2011  . Dilation and curettage of uterus  01/16/2013    Procedure: DILATATION AND CURETTAGE;  Surgeon: Gus Height, MD;  Location: [facility];  Service: Gynecology;;   Family History  Problem Relation Age of Onset  . Heart disease Mother   . Stomach cancer Father   . Heart disease Father   . Colon cancer Neg Hx   . Other Father     non hodgkins lymphoma   History  Substance Use Topics  . Smoking status: Current Every Day Smoker -- 0.25 packs/day for 20  years    Types: Cigarettes  . Smokeless tobacco: Never Used  . Alcohol Use: 0.6 oz/week    1 Glasses of wine per week     Comment: occasionally   OB History    Gravida Para Term Preterm AB TAB SAB Ectopic Multiple Living   7 2 2  5 2 3   2      Review of Systems  Constitutional: Positive for fever (not for last 2 days however had 3 days of fever up to 101), activity change and fatigue.  HENT: Positive for congestion, rhinorrhea and sore throat.   Eyes: Negative for visual disturbance.  Respiratory: Positive for cough (mild continuing). Negative for shortness of breath and wheezing.   Cardiovascular: Positive for chest pain (with coughing,, palpation).  Gastrointestinal: Negative for nausea, vomiting, abdominal pain and diarrhea.  Genitourinary: Negative for difficulty urinating.  Musculoskeletal: Positive for myalgias. Negative for back pain and neck pain.  Skin: Negative for rash.  Neurological: Negative for syncope and headaches.      Allergies  Codeine and Doxycycline  Home Medications   Prior to Admission medications   Medication Sig Start Date End Date Taking? Authorizing Provider  ALBUTEROL IN Inhale 2 puffs into the lungs every 6 (six) hours as needed (wheezing).    Yes Historical Provider, MD  ALPRAZolam (XANAX) 0.25 MG tablet Take 0.25 mg by mouth daily as needed. For anxiety  Yes Historical Provider, MD  benzonatate (TESSALON) 200 MG capsule Take 200 mg by mouth 3 (three) times daily as needed for cough.   Yes Historical Provider, MD  hydrochlorothiazide (HYDRODIURIL) 25 MG tablet Take 25 mg by mouth daily.  01/31/13  Yes Historical Provider, MD  ibuprofen (ADVIL,MOTRIN) 200 MG tablet Take 200 mg by mouth every 6 (six) hours as needed.   Yes Historical Provider, MD  levofloxacin (LEVAQUIN) 500 MG tablet Take 500 mg by mouth daily. Started on 09-04-14 for 7 days   Yes Historical Provider, MD   BP 143/85 mmHg  Pulse 66  Temp(Src) 98.6 F (37 C) (Oral)  Resp 16  Ht  5\' 6"  (1.676 m)  Wt 182 lb (82.555 kg)  BMI 29.39 kg/m2  SpO2 97% Physical Exam  Constitutional: She is oriented to person, place, and time. She appears well-developed and well-nourished. No distress.  HENT:  Head: Normocephalic and atraumatic.  Eyes: Conjunctivae and EOM are normal. Pupils are equal, round, and reactive to light.  Neck: Normal range of motion.  No supraclavicular swelling  Cardiovascular: Normal rate, regular rhythm, normal heart sounds and intact distal pulses.  Exam reveals no gallop and no friction rub.   No murmur heard. Pulmonary/Chest: Effort normal and breath sounds normal. No respiratory distress. She has no wheezes. She has no rales. She exhibits tenderness.  Abdominal: Soft. She exhibits no distension. There is no tenderness. There is no guarding.  Musculoskeletal: She exhibits no edema or tenderness.  Neurological: She is alert and oriented to person, place, and time.  Skin: Skin is warm and dry. No rash noted. She is not diaphoretic. No erythema.  Nursing note and vitals reviewed.   ED Course  Procedures (including critical care time) Labs Review Labs Reviewed  CBC - Abnormal; Notable for the following:    WBC 12.2 (*)    All other components within normal limits  BASIC METABOLIC PANEL  I-STAT TROPOININ, ED    Imaging Review No results found.   EKG Interpretation   Date/Time:  Thursday September 12 2014 22:01:42 EST Ventricular Rate:  80 PR Interval:  128 QRS Duration: 76 QT Interval:  376 QTC Calculation: 433 R Axis:   48 Text Interpretation:  Normal sinus rhythm Borderline ECG Confirmed by RAY  MD, Andee Poles ([id number]) on 09/12/2014 11:39:23 PM      MDM   Final diagnoses:  Viral syndrome  Body aches   43 year old female with history of smoking presents with concern of 1 week of cough, with days of myalgias, concern for supraclavicular swelling. Patient's father died of nonhodgkins lymphoma and pt concerned regarding lymph nodes.  No  enlarged lymph nodes noted on exam and discussed importance of having PCP for close follow up if she felt she was having changes.  Patient concerned regarding body aches.  She has had URI symptoms followed by body aches and fevers a few days ago most consistent with viral or influenza like syndrome.  Cough nearly resolved and patient without fever, tachypnea, hypoxia and low suspicion for pneumonia.  No signs of otiits media. Labs WNL. Single troponin ordered in triage given chest pain.   EKG normal sinus rhythm. Have low suspicion CP is ACS, PE, pericarditis, pneumothorax based on history and exam and pt without risk factors other than smoking.  Pain sharp and worse with coughing and palpation most consistent with musculoskeletal pain from coughing.  Patient most likely with continuing viral syndrome.  Discussed need for PCP follow up and importance  of smoking cessation in detail. Patient discharged in stable condition with understanding of reasons to return.     Alvino Chapel, MD 09/13/14 [place], MD 09/14/14 [phone]

## 2015-03-19 ENCOUNTER — Emergency Department (HOSPITAL_BASED_OUTPATIENT_CLINIC_OR_DEPARTMENT_OTHER)
Admission: EM | Admit: 2015-03-19 | Discharge: 2015-03-19 | Disposition: A | Discharge status: Home / Self Care | Payer: Managed Care, Other (non HMO) | Attending: Emergency Medicine | Admitting: Emergency Medicine

## 2015-03-19 ENCOUNTER — Encounter (HOSPITAL_BASED_OUTPATIENT_CLINIC_OR_DEPARTMENT_OTHER): Payer: Self-pay | Admitting: Emergency Medicine

## 2015-03-19 ENCOUNTER — Emergency Department (HOSPITAL_BASED_OUTPATIENT_CLINIC_OR_DEPARTMENT_OTHER): Payer: Managed Care, Other (non HMO)

## 2015-03-19 DIAGNOSIS — I10 Essential (primary) hypertension: Secondary | ICD-10-CM | POA: Diagnosis not present

## 2015-03-19 DIAGNOSIS — Z79899 Other long term (current) drug therapy: Secondary | ICD-10-CM | POA: Diagnosis not present

## 2015-03-19 DIAGNOSIS — Z8742 Personal history of other diseases of the female genital tract: Secondary | ICD-10-CM | POA: Diagnosis not present

## 2015-03-19 DIAGNOSIS — F329 Major depressive disorder, single episode, unspecified: Secondary | ICD-10-CM | POA: Insufficient documentation

## 2015-03-19 DIAGNOSIS — R202 Paresthesia of skin: Secondary | ICD-10-CM | POA: Insufficient documentation

## 2015-03-19 DIAGNOSIS — F419 Anxiety disorder, unspecified: Secondary | ICD-10-CM | POA: Insufficient documentation

## 2015-03-19 DIAGNOSIS — R0602 Shortness of breath: Secondary | ICD-10-CM | POA: Diagnosis not present

## 2015-03-19 DIAGNOSIS — R011 Cardiac murmur, unspecified: Secondary | ICD-10-CM | POA: Diagnosis not present

## 2015-03-19 DIAGNOSIS — R079 Chest pain, unspecified: Secondary | ICD-10-CM

## 2015-03-19 DIAGNOSIS — Z720 Tobacco use: Secondary | ICD-10-CM | POA: Diagnosis not present

## 2015-03-19 LAB — TROPONIN I: Troponin I: <0.03 ng/mL (ref <0.031)

## 2015-03-19 LAB — CBC
HEMATOCRIT: 40.6 % (ref 36.0–46.0)
Hemoglobin: 13.9 g/dL (ref 12.0–15.0)
MCH: 29.3 pg (ref 26.0–34.0)
MCHC: 34.2 g/dL (ref 30.0–36.0)
MCV: 85.5 fL (ref 78.0–100.0)
Platelets: 203 10*3/uL (ref 150–400)
RBC: 4.75 MIL/uL (ref 3.87–5.11)
RDW: 13.1 % (ref 11.5–15.5)
WBC: 7.9 10*3/uL (ref 4.0–10.5)

## 2015-03-19 LAB — BASIC METABOLIC PANEL
Anion gap: 7 (ref 5–15)
BUN: 13 mg/dL (ref 6–20)
CALCIUM: 8.7 mg/dL — AB (ref 8.9–10.3)
CHLORIDE: 105 mmol/L (ref 101–111)
CO2: 27 mmol/L (ref 22–32)
Creatinine, Ser: 0.75 mg/dL (ref 0.44–1.00)
GFR calc Af Amer: >60 mL/min (ref >60)
GFR calc non Af Amer: >60 mL/min (ref >60)
Glucose, Bld: 95 mg/dL (ref 65–99)
Potassium: 3.7 mmol/L (ref 3.5–5.1)
Sodium: 139 mmol/L (ref 135–145)

## 2015-03-19 LAB — D-DIMER, QUANTITATIVE (NOT AT ARMC)

## 2015-03-19 MED ORDER — METOCLOPRAMIDE HCL 5 MG/ML IJ SOLN
10.0000 mg | Freq: Once | INTRAMUSCULAR | Status: AC
Start: 2015-03-19 — End: 2015-03-19
  Administered 2015-03-19: 10 mg via INTRAVENOUS
  Filled 2015-03-19: qty 2

## 2015-03-19 MED ORDER — SODIUM CHLORIDE 0.9 % IV BOLUS (SEPSIS)
1000.0000 mL | Freq: Once | INTRAVENOUS | Status: AC
  Administered 2015-03-19: 1000 mL via INTRAVENOUS

## 2015-03-19 MED ORDER — DIPHENHYDRAMINE HCL 50 MG/ML IJ SOLN
12.5000 mg | Freq: Once | INTRAMUSCULAR | Status: AC
  Administered 2015-03-19: 12.5 mg via INTRAVENOUS
  Filled 2015-03-19: qty 1

## 2015-03-19 NOTE — ED Notes (Signed)
Pt ambulatory to room. Pt states that she has been feeling "lowsy" for the past several days. Pt states that while at work this morning she began to have chest pain and shortness of breath. Pt states the pain radiates down her left arm. Pt states she has had chest pain before but nothing of this nature. Pt was seen by her HR dept at work and her BP was noted to be high.

## 2015-03-19 NOTE — Discharge Instructions (Signed)

## 2015-03-19 NOTE — ED Provider Notes (Signed)
CSN: [id number]     Arrival date & time 03/19/15  1139 History   First MD Initiated Contact with Patient 03/19/15 1157     Chief Complaint  Patient presents with  . Chest Pain  . Shortness of Breath     (Consider location/radiation/quality/duration/timing/severity/associated sxs/prior Treatment) Patient is a 44 y.o. female presenting with chest pain and shortness of breath. The history is provided by the patient.  Chest Pain Pain location:  L chest Pain quality: tightness   Pain quality comment:  "clinchy" Pain radiates to:  Does not radiate Pain radiates to the back: no   Pain severity:  Moderate Onset quality:  Gradual Timing:  Constant Progression:  Unchanged Chronicity:  New Context: at rest   Relieved by:  Nothing Worsened by:  Nothing tried Associated symptoms: shortness of breath   Associated symptoms: no cough, no diaphoresis, no fever, no nausea and not vomiting   Shortness of Breath Associated symptoms: chest pain   Associated symptoms: no cough, no diaphoresis, no fever and no vomiting     Past Medical History  Diagnosis Date  . Hypertension   . Dysmenorrhea   . Anxiety   . Depression   . PONV (postoperative nausea and vomiting)   . Heart murmur    Past Surgical History  Procedure Laterality Date  . Dilation and curettage of uterus    . Uterine ablation  08/13/2011  . Dilation and curettage of uterus  01/16/2013    Procedure: DILATATION AND CURETTAGE;  Surgeon: Gus Height, MD;  Location: [facility];  Service: Gynecology;;   Family History  Problem Relation Age of Onset  . Heart disease Mother   . Stomach cancer Father   . Heart disease Father   . Colon cancer Neg Hx   . Other Father     non hodgkins lymphoma   History  Substance Use Topics  . Smoking status: Current Every Day Smoker -- 0.50 packs/day for 20 years    Types: Cigarettes  . Smokeless tobacco: Never Used  . Alcohol Use: Yes     Comment: occasionally   OB History    Gravida Para Term  Preterm AB TAB SAB Ectopic Multiple Living   7 2 2  5 2 3   2      Review of Systems  Constitutional: Negative for fever and diaphoresis.  Respiratory: Positive for shortness of breath. Negative for cough.   Cardiovascular: Positive for chest pain.  Gastrointestinal: Negative for nausea and vomiting.  All other systems reviewed and are negative.     Allergies  Codeine and Doxycycline  Home Medications   Prior to Admission medications   Medication Sig Start Date End Date Taking? Authorizing Provider  ALBUTEROL IN Inhale 2 puffs into the lungs every 6 (six) hours as needed (wheezing).     Historical Provider, MD  ALPRAZolam Duanne Moron) 0.25 MG tablet Take 0.25 mg by mouth daily as needed. For anxiety    Historical Provider, MD  benzonatate (TESSALON) 200 MG capsule Take 200 mg by mouth 3 (three) times daily as needed for cough.    Historical Provider, MD  hydrochlorothiazide (HYDRODIURIL) 25 MG tablet Take 25 mg by mouth daily.  01/31/13   Historical Provider, MD  ibuprofen (ADVIL,MOTRIN) 200 MG tablet Take 200 mg by mouth every 6 (six) hours as needed.    Historical Provider, MD  levofloxacin (LEVAQUIN) 500 MG tablet Take 500 mg by mouth daily. Started on 09-04-14 for 7 days    Historical Provider, MD  BP 151/98 mmHg  Pulse 74  Temp(Src) 98.6 F (37 C) (Oral)  Resp 15  SpO2 99%  LMP 03/17/2015 Physical Exam  Constitutional: She is oriented to person, place, and time. She appears well-developed and well-nourished. No distress.  HENT:  Head: Normocephalic and atraumatic.  Mouth/Throat: Oropharynx is clear and moist.  Eyes: EOM are normal. Pupils are equal, round, and reactive to light.  Neck: Normal range of motion. Neck supple.  Cardiovascular: Normal rate and regular rhythm.  Exam reveals no friction rub.   No murmur heard. Pulmonary/Chest: Effort normal and breath sounds normal. No respiratory distress. She has no wheezes. She has no rales. She exhibits no tenderness.   Abdominal: Soft. She exhibits no distension. There is no tenderness. There is no rebound.  Musculoskeletal: Normal range of motion. She exhibits no edema.  Neurological: She is alert and oriented to person, place, and time.  Skin: No rash noted. She is not diaphoretic.  Nursing note and vitals reviewed.   ED Course  Procedures (including critical care time) Labs Review Labs Reviewed  CBC  BASIC METABOLIC PANEL  TROPONIN I  D-DIMER, QUANTITATIVE (NOT AT [facility])    Imaging Review Dg Chest 2 View  03/19/2015   CLINICAL DATA:  Initial encounter for 3 day history of chest pain radiating to left arm and shortness of breath.  EXAM: CHEST  2 VIEW  COMPARISON:  09/10/2014.  FINDINGS: 1242 hrs. The lungs are clear without focal infiltrate, edema, pneumothorax or pleural effusion. The cardiopericardial silhouette is within normal limits for size. Imaged bony structures of the thorax are intact. Telemetry leads overlie the chest.  IMPRESSION: No active cardiopulmonary disease.   Electronically Signed   By: Misty Stanley M.D.   On: 03/19/2015 12:54   Ct Head Wo Contrast  03/19/2015   CLINICAL DATA:  Posterior head and neck pain for 3 days. No known injury.  EXAM: CT HEAD WITHOUT CONTRAST  TECHNIQUE: Contiguous axial images were obtained from the base of the skull through the vertex without intravenous contrast.  COMPARISON:  06/28/2012  FINDINGS: No acute intracranial abnormality. Specifically, no hemorrhage, hydrocephalus, mass lesion, acute infarction, or significant intracranial injury. No acute calvarial abnormality. Visualized paranasal sinuses and mastoids clear. Orbital soft tissues unremarkable.  IMPRESSION: Negative.   Electronically Signed   By: Rolm Baptise M.D.   On: 03/19/2015 12:55     EKG Interpretation   Date/Time:  Wednesday March 19 2015 11:48:51 EDT Ventricular Rate:  75 PR Interval:  138 QRS Duration: 76 QT Interval:  394 QTC Calculation: 439 R Axis:   45 Text Interpretation:   Normal sinus rhythm Possible Left atrial enlargement  Borderline ECG No significant change since last tracing Confirmed by  Mingo Amber  MD, [place] ([id number]) on 03/19/2015 11:51:32 AM      MDM   Final diagnoses:  Chest pain    44 year old female here with chest pain. Described as a tightness in her left chest going to her left arm. She also reported some occasional left leg tingling when she was walking. She is not expressing any of these symptoms at this time. She states she's been feeling, "lousy" for the past several days. The lousy and is started with headache, dizziness. She does have history in her family of her brain tumor she is concerned about that. No history of cardiac disease. She has recently had endometrial ablation started to have heavy periods and she's had 3 heavy periods in the past 6 weeks. Here vitals are stable.  Exam is benign. We'll check labs, head CT. Will give Reglan and Benadryl for her headache. We'll check a d-dimer for her chest pain as she states it is mildly pleuritic. EKG is normal.  Patient's labs are all normal. I discussed the reduce your troponin testing and how that would give [place] more reassurance is not cardiac. She does not list any longer. I feel that is appropriate since its of very atypical chest pain. Patient is stable for discharge. Given PCP referral.  Evelina Bucy, MD 03/19/15 [phone]

## 2015-03-19 NOTE — ED Notes (Signed)
Pt given water per verbal order from Dr. Mingo Amber.

## 2015-10-23 ENCOUNTER — Emergency Department (HOSPITAL_BASED_OUTPATIENT_CLINIC_OR_DEPARTMENT_OTHER)
Admission: EM | Admit: 2015-10-23 | Discharge: 2015-10-23 | Disposition: A | Discharge status: Home / Self Care | Payer: Managed Care, Other (non HMO) | Attending: Emergency Medicine | Admitting: Emergency Medicine

## 2015-10-23 ENCOUNTER — Encounter (HOSPITAL_BASED_OUTPATIENT_CLINIC_OR_DEPARTMENT_OTHER): Payer: Self-pay | Admitting: *Deleted

## 2015-10-23 DIAGNOSIS — F329 Major depressive disorder, single episode, unspecified: Secondary | ICD-10-CM | POA: Insufficient documentation

## 2015-10-23 DIAGNOSIS — S199XXA Unspecified injury of neck, initial encounter: Secondary | ICD-10-CM | POA: Diagnosis not present

## 2015-10-23 DIAGNOSIS — Z79899 Other long term (current) drug therapy: Secondary | ICD-10-CM | POA: Diagnosis not present

## 2015-10-23 DIAGNOSIS — Y998 Other external cause status: Secondary | ICD-10-CM | POA: Insufficient documentation

## 2015-10-23 DIAGNOSIS — Z880 Allergy status to penicillin: Secondary | ICD-10-CM | POA: Insufficient documentation

## 2015-10-23 DIAGNOSIS — W01198A Fall on same level from slipping, tripping and stumbling with subsequent striking against other object, initial encounter: Secondary | ICD-10-CM | POA: Diagnosis not present

## 2015-10-23 DIAGNOSIS — Y93E1 Activity, personal bathing and showering: Secondary | ICD-10-CM | POA: Insufficient documentation

## 2015-10-23 DIAGNOSIS — Y92091 Bathroom in other non-institutional residence as the place of occurrence of the external cause: Secondary | ICD-10-CM | POA: Insufficient documentation

## 2015-10-23 DIAGNOSIS — S060X0A Concussion without loss of consciousness, initial encounter: Secondary | ICD-10-CM | POA: Insufficient documentation

## 2015-10-23 DIAGNOSIS — Z8742 Personal history of other diseases of the female genital tract: Secondary | ICD-10-CM | POA: Diagnosis not present

## 2015-10-23 DIAGNOSIS — R011 Cardiac murmur, unspecified: Secondary | ICD-10-CM | POA: Diagnosis not present

## 2015-10-23 DIAGNOSIS — I10 Essential (primary) hypertension: Secondary | ICD-10-CM | POA: Diagnosis not present

## 2015-10-23 DIAGNOSIS — F419 Anxiety disorder, unspecified: Secondary | ICD-10-CM | POA: Diagnosis not present

## 2015-10-23 DIAGNOSIS — F17210 Nicotine dependence, cigarettes, uncomplicated: Secondary | ICD-10-CM | POA: Insufficient documentation

## 2015-10-23 DIAGNOSIS — S0990XA Unspecified injury of head, initial encounter: Secondary | ICD-10-CM | POA: Diagnosis present

## 2015-10-23 MED ORDER — IBUPROFEN 800 MG PO TABS
800.0000 mg | ORAL_TABLET | Freq: Once | ORAL | Status: AC
  Administered 2015-10-23: 800 mg via ORAL
  Filled 2015-10-23: qty 1

## 2015-10-23 MED ORDER — PROMETHAZINE HCL 25 MG/ML IJ SOLN: 25.0000 mg | Freq: Once | INTRAMUSCULAR | Status: DC

## 2015-10-23 MED ORDER — ONDANSETRON 4 MG PO TBDP
4.0000 mg | ORAL_TABLET | Freq: Once | ORAL | Status: AC
  Administered 2015-10-23: 4 mg via ORAL
  Filled 2015-10-23: qty 1

## 2015-10-23 NOTE — ED Notes (Signed)
MD at bedside. 

## 2015-10-23 NOTE — ED Provider Notes (Signed)
CSN: [id number]     Arrival date & time 10/23/15  [id number] History   First MD Initiated Contact with Patient 10/23/15 1030     Chief Complaint  Patient presents with  . Head Injury     Patient is a 45 y.o. female presenting with head injury. The history is provided by the patient. No language interpreter was used.  Head Injury  Lori Jensen is a 45 y.o. female who presents to the Emergency Department complaining of head injury.  At 8 AM this morning she was stepping out of the shower when she slipped back and struck her head.She did not lose consciousness but she saw stars for a minute.  She reports pain to her posterior head and lateral neck with significant nausea.  She denies any vomiting, numbness, weakness.  Sxs are moderate and constant in nature.    Past Medical History  Diagnosis Date  . Hypertension   . Dysmenorrhea   . Anxiety   . Depression   . PONV (postoperative nausea and vomiting)   . Heart murmur    Past Surgical History  Procedure Laterality Date  . Dilation and curettage of uterus    . Uterine ablation  08/13/2011  . Dilation and curettage of uterus  01/16/2013    Procedure: DILATATION AND CURETTAGE;  Surgeon: Gus Height, MD;  Location: [facility];  Service: Gynecology;;   Family History  Problem Relation Age of Onset  . Heart disease Mother   . Stomach cancer Father   . Heart disease Father   . Colon cancer Neg Hx   . Other Father     non hodgkins lymphoma   Social History  Substance Use Topics  . Smoking status: Current Every Day Smoker -- 0.50 packs/day for 20 years    Types: Cigarettes  . Smokeless tobacco: Never Used  . Alcohol Use: Yes     Comment: occasionally   OB History    Gravida Para Term Preterm AB TAB SAB Ectopic Multiple Living   7 2 2  5 2 3   2      Review of Systems  All other systems reviewed and are negative.     Allergies  Codeine; Amoxicillin; and Doxycycline  Home Medications   Prior to Admission medications    Medication Sig Start Date End Date Taking? Authorizing Provider  ALBUTEROL IN Inhale 2 puffs into the lungs every 6 (six) hours as needed (wheezing).     Historical Provider, MD  ALPRAZolam Duanne Moron) 0.25 MG tablet Take 0.25 mg by mouth daily as needed. For anxiety    Historical Provider, MD  hydrochlorothiazide (HYDRODIURIL) 25 MG tablet Take 25 mg by mouth daily.  01/31/13   Historical Provider, MD  ibuprofen (ADVIL,MOTRIN) 200 MG tablet Take 200 mg by mouth every 6 (six) hours as needed.    Historical Provider, MD   BP 137/101 mmHg  Pulse 81  Temp(Src) 98.2 F (36.8 C) (Oral)  Resp 16  Ht 5\' 6"  (1.676 m)  Wt 200 lb (90.719 kg)  BMI 32.30 kg/m2  SpO2 99%  LMP 10/17/2015 Physical Exam  Constitutional: She is oriented to person, place, and time. She appears well-developed and well-nourished.  HENT:  Head: Normocephalic and atraumatic.  Eyes: EOM are normal. Pupils are equal, round, and reactive to light.  Neck:  TTP over posteriolateral neck, no discrete bony tenderness.  Flexion/extension, lateral rotation intact in neck  Cardiovascular: Normal rate and regular rhythm.   No murmur heard. Pulmonary/Chest: Effort normal and  breath sounds normal. No respiratory distress.  Musculoskeletal: She exhibits no edema or tenderness.  Neurological: She is alert and oriented to person, place, and time.  5/5 strength in all four extremities with sensation to light touch intact in all four extremities.   Skin: Skin is warm and dry.  Psychiatric: She has a normal mood and affect. Her behavior is normal.  Nursing note and vitals reviewed.   ED Course  Procedures (including critical care time) Labs Review Labs Reviewed - No data to display  Imaging Review No results found. I have personally reviewed and evaluated these images and lab results as part of my medical decision-making.   EKG Interpretation None      MDM   Final diagnoses:  Concussion, without loss of consciousness, initial  encounter    Pt here for evaluation of injuries following a mechanical fall.  Pt with concussion, no head or neck CT needed based on [place] head and cspine CT rules.  Discussed outpatient follow-up as well as return precautions.    Quintella Reichert, MD 10/23/15 [phone]

## 2015-10-23 NOTE — ED Notes (Signed)
Pt reports she has not taken her b/p med yet. D/C resource guide given for follow up. Note for work providied

## 2015-10-23 NOTE — ED Notes (Signed)
Pt amb to room 5 with quick steady gait in nad. Pt reports slip and fall in the shower hitting her head on shower wall. Denies loc, "but it made me see stars for a minute". Pt states she got dressed and drove to work, but became nauseous so she came here.

## 2015-10-23 NOTE — Discharge Instructions (Signed)
Concussion, Adult A concussion, or closed-head injury, is a brain injury caused by a direct blow to the head or by a quick and sudden movement (jolt) of the head or neck. Concussions are usually not life-threatening. Even so, the effects of a concussion can be serious. If you have had a concussion before, you are more likely to experience concussion-like symptoms after a direct blow to the head.  CAUSES  Direct blow to the head, such as from running into another player during a soccer game, being hit in a fight, or hitting your head on a hard surface.  A jolt of the head or neck that causes the brain to move back and forth inside the skull, such as in a car crash. SIGNS AND SYMPTOMS The signs of a concussion can be hard to notice. Early on, they may be missed by you, family members, and health care providers. You may look fine but act or feel differently. Symptoms are usually temporary, but they may last for days, weeks, or even longer. Some symptoms may appear right away while others may not show up for hours or days. Every head injury is different. Symptoms include:  Mild to moderate headaches that will not go away.  A feeling of pressure inside your head.  Having more trouble than usual:  Learning or remembering things you have heard.  Answering questions.  Paying attention or concentrating.  Organizing daily tasks.  Making decisions and solving problems.  Slowness in thinking, acting or reacting, speaking, or reading.  Getting lost or being easily confused.  Feeling tired all the time or lacking energy (fatigued).  Feeling drowsy.  Sleep disturbances.  Sleeping more than usual.  Sleeping less than usual.  Trouble falling asleep.  Trouble sleeping (insomnia).  Loss of balance or feeling lightheaded or dizzy.  Nausea or vomiting.  Numbness or tingling.  Increased sensitivity to:  Sounds.  Lights.  Distractions.  Vision problems or eyes that tire  easily.  Diminished sense of taste or smell.  Ringing in the ears.  Mood changes such as feeling sad or anxious.  Becoming easily irritated or angry for little or no reason.  Lack of motivation.  Seeing or hearing things other people do not see or hear (hallucinations). DIAGNOSIS Your health care provider can usually diagnose a concussion based on a description of your injury and symptoms. He or she will ask whether you passed out (lost consciousness) and whether you are having trouble remembering events that happened right before and during your injury. Your evaluation might include:  A brain scan to look for signs of injury to the brain. Even if the test shows no injury, you may still have a concussion.  Blood tests to be sure other problems are not present. TREATMENT  Concussions are usually treated in an emergency department, in urgent care, or at a clinic. You may need to stay in the hospital overnight for further treatment.  Tell your health care provider if you are taking any medicines, including prescription medicines, over-the-counter medicines, and natural remedies. Some medicines, such as blood thinners (anticoagulants) and aspirin, may increase the chance of complications. Also tell your health care provider whether you have had alcohol or are taking illegal drugs. This information may affect treatment.  Your health care provider will send you home with important instructions to follow.  How fast you will recover from a concussion depends on many factors. These factors include how severe your concussion is, what part of your brain was injured,  your age, and how healthy you were before the concussion. °· Most people with mild injuries recover fully. Recovery can take time. In general, recovery is slower in older persons. Also, persons who have had a concussion in the past or have other medical problems may find that it takes longer to recover from their current injury. °HOME  CARE INSTRUCTIONS °General Instructions °· Carefully follow the directions your health care provider gave you. °· Only take over-the-counter or prescription medicines for pain, discomfort, or fever as directed by your health care provider. °· Take only those medicines that your health care provider has approved. °· Do not drink alcohol until your health care provider says you are well enough to do so. Alcohol and certain other drugs may slow your recovery and can put you at risk of further injury. °· If it is harder than usual to remember things, write them down. °· If you are easily distracted, try to do one thing at a time. For example, do not try to watch TV while fixing dinner. °· Talk with family members or close friends when making important decisions. °· Keep all follow-up appointments. Repeated evaluation of your symptoms is recommended for your recovery. °· Watch your symptoms and tell others to do the same. Complications sometimes occur after a concussion. Older adults with a brain injury may have a higher risk of serious complications, such as a blood clot on the brain. °· Tell your teachers, school nurse, school counselor, coach, athletic trainer, or work manager about your injury, symptoms, and restrictions. Tell them about what you can or cannot do. They should watch for: °¨ Increased problems with attention or concentration. °¨ Increased difficulty remembering or learning new information. °¨ Increased time needed to complete tasks or assignments. °¨ Increased irritability or decreased ability to cope with stress. °¨ Increased symptoms. °· Rest. Rest helps the brain to heal. Make sure you: °¨ Get plenty of sleep at night. Avoid staying up late at night. °¨ Keep the same bedtime hours on weekends and weekdays. °¨ Rest during the day. Take daytime naps or rest breaks when you feel tired. °· Limit activities that require a lot of thought or concentration. These include: °¨ Doing homework or job-related  work. °¨ Watching TV. °¨ Working on the computer. °· Avoid any situation where there is potential for another head injury (football, hockey, soccer, basketball, martial arts, downhill snow sports and horseback riding). Your condition will get worse every time you experience a concussion. You should avoid these activities until you are evaluated by the appropriate follow-up health care providers. °Returning To Your Regular Activities °You will need to return to your normal activities slowly, not all at once. You must give your body and brain enough time for recovery. °· Do not return to sports or other athletic activities until your health care provider tells you it is safe to do so. °· Ask your health care provider when you can drive, ride a bicycle, or operate heavy machinery. Your ability to react may be slower after a brain injury. Never do these activities if you are dizzy. °· Ask your health care provider about when you can return to work or school. °Preventing Another Concussion °It is very important to avoid another brain injury, especially before you have recovered. In rare cases, another injury can lead to permanent brain damage, brain swelling, or death. The risk of this is greatest during the first 7-10 days after a head injury. Avoid injuries by: °· Wearing a   seat belt when riding in a car.  Drinking alcohol only in moderation.  Wearing a helmet when biking, skiing, skateboarding, skating, or doing similar activities.  Avoiding activities that could lead to a second concussion, such as contact or recreational sports, until your health care provider says it is okay.  Taking safety measures in your home.  Remove clutter and tripping hazards from floors and stairways.  Use grab bars in bathrooms and handrails by stairs.  Place non-slip mats on floors and in bathtubs.  Improve lighting in dim areas. SEEK MEDICAL CARE IF:  You have increased problems paying attention or  concentrating.  You have increased difficulty remembering or learning new information.  You need more time to complete tasks or assignments than before.  You have increased irritability or decreased ability to cope with stress.  You have more symptoms than before. Seek medical care if you have any of the following symptoms for more than 2 weeks after your injury:  Lasting (chronic) headaches.  Dizziness or balance problems.  Nausea.  Vision problems.  Increased sensitivity to noise or light.  Depression or mood swings.  Anxiety or irritability.  Memory problems.  Difficulty concentrating or paying attention.  Sleep problems.  Feeling tired all the time. SEEK IMMEDIATE MEDICAL CARE IF:  You have severe or worsening headaches. These may be a sign of a blood clot in the brain.  You have weakness (even if only in one hand, leg, or part of the face).  You have numbness.  You have decreased coordination.  You vomit repeatedly.  You have increased sleepiness.  One pupil is larger than the other.  You have convulsions.  You have slurred speech.  You have increased confusion. This may be a sign of a blood clot in the brain.  You have increased restlessness, agitation, or irritability.  You are unable to recognize people or places.  You have neck pain.  It is difficult to wake you up.  You have unusual behavior changes.  You lose consciousness. MAKE SURE YOU:  Understand these instructions.  Will watch your condition.  Will get help right away if you are not doing well or get worse.   This information is not intended to replace advice given to you by your health care provider. Make sure you discuss any questions you have with your health care provider.   Document Released: 12/04/2003 Document Revised: 10/04/2014 Document Reviewed: 04/05/2013 Elsevier Interactive Patient Education 2016 [employer] Cervical Sprain A cervical sprain is an injury in  the neck in which the strong, fibrous tissues (ligaments) that connect your neck bones stretch or tear. Cervical sprains can range from mild to severe. Severe cervical sprains can cause the neck vertebrae to be unstable. This can lead to damage of the spinal cord and can result in serious nervous system problems. The amount of time it takes for a cervical sprain to get better depends on the cause and extent of the injury. Most cervical sprains heal in 1 to 3 weeks. CAUSES  Severe cervical sprains may be caused by:   Contact sport injuries (such as from football, rugby, wrestling, hockey, auto racing, gymnastics, diving, martial arts, or boxing).   Motor vehicle collisions.   Whiplash injuries. This is an injury from a sudden forward and backward whipping movement of the head and neck.  Falls.  Mild cervical sprains may be caused by:   Being in an awkward position, such as while cradling a telephone between your ear and shoulder.  Sitting in a chair that does not offer proper support.   Working at a poorly Landscape architect station.   Looking up or down for long periods of time.  SYMPTOMS   Pain, soreness, stiffness, or a burning sensation in the front, back, or sides of the neck. This discomfort may develop immediately after the injury or slowly, 24 hours or more after the injury.   Pain or tenderness directly in the middle of the back of the neck.   Shoulder or upper back pain.   Limited ability to move the neck.   Headache.   Dizziness.   Weakness, numbness, or tingling in the hands or arms.   Muscle spasms.   Difficulty swallowing or chewing.   Tenderness and swelling of the neck.  DIAGNOSIS  Most of the time your health care provider can diagnose a cervical sprain by taking your history and doing a physical exam. Your health care provider will ask about previous neck injuries and any known neck problems, such as arthritis in the neck. X-rays may be  taken to find out if there are any other problems, such as with the bones of the neck. Other tests, such as a CT scan or MRI, may also be needed.  TREATMENT  Treatment depends on the severity of the cervical sprain. Mild sprains can be treated with rest, keeping the neck in place (immobilization), and pain medicines. Severe cervical sprains are immediately immobilized. Further treatment is done to help with pain, muscle spasms, and other symptoms and may include:  Medicines, such as pain relievers, numbing medicines, or muscle relaxants.   Physical therapy. This may involve stretching exercises, strengthening exercises, and posture training. Exercises and improved posture can help stabilize the neck, strengthen muscles, and help stop symptoms from returning.  HOME CARE INSTRUCTIONS   Put ice on the injured area.   Put ice in a plastic bag.   Place a towel between your skin and the bag.   Leave the ice on for 15-20 minutes, 3-4 times a day.   If your injury was severe, you may have been given a cervical collar to wear. A cervical collar is a two-piece collar designed to keep your neck from moving while it heals.  Do not remove the collar unless instructed by your health care provider.  If you have long hair, keep it outside of the collar.  Ask your health care provider before making any adjustments to your collar. Minor adjustments may be required over time to improve comfort and reduce pressure on your chin or on the back of your head.  Ifyou are allowed to remove the collar for cleaning or bathing, follow your health care provider's instructions on how to do so safely.  Keep your collar clean by wiping it with mild soap and water and drying it completely. If the collar you have been given includes removable pads, remove them every 1-2 days and hand wash them with soap and water. Allow them to air dry. They should be completely dry before you wear them in the collar.  If you are  allowed to remove the collar for cleaning and bathing, wash and dry the skin of your neck. Check your skin for irritation or sores. If you see any, tell your health care provider.  Do not drive while wearing the collar.   Only take over-the-counter or prescription medicines for pain, discomfort, or fever as directed by your health care provider.   Keep all follow-up appointments as directed by  your health care provider.   Keep all physical therapy appointments as directed by your health care provider.   Make any needed adjustments to your workstation to promote good posture.   Avoid positions and activities that make your symptoms worse.   Warm up and stretch before being active to help prevent problems.  SEEK MEDICAL CARE IF:   Your pain is not controlled with medicine.   You are unable to decrease your pain medicine over time as planned.   Your activity level is not improving as expected.  SEEK IMMEDIATE MEDICAL CARE IF:   You develop any bleeding.  You develop stomach upset.  You have signs of an allergic reaction to your medicine.   Your symptoms get worse.   You develop new, unexplained symptoms.   You have numbness, tingling, weakness, or paralysis in any part of your body.  MAKE SURE YOU:   Understand these instructions.  Will watch your condition.  Will get help right away if you are not doing well or get worse.   This information is not intended to replace advice given to you by your health care provider. Make sure you discuss any questions you have with your health care provider.   Document Released: 07/11/2007 Document Revised: 09/18/2013 Document Reviewed: 03/21/2013 Elsevier Interactive Patient Education [employer].

## 2015-11-03 DIAGNOSIS — I10 Essential (primary) hypertension: Secondary | ICD-10-CM | POA: Insufficient documentation

## 2015-11-03 DIAGNOSIS — M7918 Myalgia, other site: Secondary | ICD-10-CM | POA: Insufficient documentation

## 2015-11-03 DIAGNOSIS — E669 Obesity, unspecified: Secondary | ICD-10-CM | POA: Insufficient documentation

## 2015-11-06 DIAGNOSIS — F331 Major depressive disorder, recurrent, moderate: Secondary | ICD-10-CM | POA: Insufficient documentation

## 2016-05-07 ENCOUNTER — Encounter (HOSPITAL_BASED_OUTPATIENT_CLINIC_OR_DEPARTMENT_OTHER): Payer: Self-pay | Admitting: *Deleted

## 2016-05-07 ENCOUNTER — Emergency Department (HOSPITAL_BASED_OUTPATIENT_CLINIC_OR_DEPARTMENT_OTHER)
Admission: EM | Admit: 2016-05-07 | Discharge: 2016-05-07 | Disposition: A | Discharge status: Home / Self Care | Payer: Managed Care, Other (non HMO) | Attending: Emergency Medicine | Admitting: Emergency Medicine

## 2016-05-07 DIAGNOSIS — F17210 Nicotine dependence, cigarettes, uncomplicated: Secondary | ICD-10-CM | POA: Diagnosis not present

## 2016-05-07 DIAGNOSIS — M549 Dorsalgia, unspecified: Secondary | ICD-10-CM | POA: Insufficient documentation

## 2016-05-07 DIAGNOSIS — R1012 Left upper quadrant pain: Secondary | ICD-10-CM | POA: Diagnosis present

## 2016-05-07 DIAGNOSIS — I10 Essential (primary) hypertension: Secondary | ICD-10-CM | POA: Insufficient documentation

## 2016-05-07 LAB — CBC WITH DIFFERENTIAL/PLATELET
BASOS PCT: 0 %
Basophils Absolute: 0 10*3/uL (ref 0.0–0.1)
EOS ABS: 0.4 10*3/uL (ref 0.0–0.7)
Eosinophils Relative: 6 %
HCT: 44.1 % (ref 36.0–46.0)
HEMOGLOBIN: 15.1 g/dL — AB (ref 12.0–15.0)
Lymphocytes Relative: 38 %
Lymphs Abs: 2.5 10*3/uL (ref 0.7–4.0)
MCH: 29.5 pg (ref 26.0–34.0)
MCHC: 34.2 g/dL (ref 30.0–36.0)
MCV: 86.1 fL (ref 78.0–100.0)
Monocytes Absolute: 0.5 10*3/uL (ref 0.1–1.0)
Monocytes Relative: 7 %
NEUTROS ABS: 3.2 10*3/uL (ref 1.7–7.7)
NEUTROS PCT: 49 %
Platelets: 222 10*3/uL (ref 150–400)
RBC: 5.12 MIL/uL — ABNORMAL HIGH (ref 3.87–5.11)
RDW: 13.2 % (ref 11.5–15.5)
WBC: 6.6 10*3/uL (ref 4.0–10.5)

## 2016-05-07 LAB — COMPREHENSIVE METABOLIC PANEL
ALBUMIN: 4.4 g/dL (ref 3.5–5.0)
ALT: 13 U/L — AB (ref 14–54)
AST: 14 U/L — AB (ref 15–41)
Alkaline Phosphatase: 50 U/L (ref 38–126)
Anion gap: 6 (ref 5–15)
BUN: 12 mg/dL (ref 6–20)
CALCIUM: 9 mg/dL (ref 8.9–10.3)
CHLORIDE: 103 mmol/L (ref 101–111)
CO2: 30 mmol/L (ref 22–32)
Creatinine, Ser: 0.85 mg/dL (ref 0.44–1.00)
GFR calc Af Amer: >60 mL/min (ref >60)
GFR calc non Af Amer: >60 mL/min (ref >60)
GLUCOSE: 92 mg/dL (ref 65–99)
Potassium: 3.4 mmol/L — ABNORMAL LOW (ref 3.5–5.1)
SODIUM: 139 mmol/L (ref 135–145)
TOTAL PROTEIN: 7 g/dL (ref 6.5–8.1)
Total Bilirubin: 0.5 mg/dL (ref 0.3–1.2)

## 2016-05-07 LAB — URINALYSIS, ROUTINE W REFLEX MICROSCOPIC
BILIRUBIN URINE: NEGATIVE
Glucose, UA: NEGATIVE mg/dL
KETONES UR: NEGATIVE mg/dL
LEUKOCYTES UA: NEGATIVE
NITRITE: NEGATIVE
Protein, ur: NEGATIVE mg/dL
SPECIFIC GRAVITY, URINE: 1.004 — AB (ref 1.005–1.030)
pH: 7 (ref 5.0–8.0)

## 2016-05-07 LAB — URINE MICROSCOPIC-ADD ON

## 2016-05-07 LAB — LIPASE, BLOOD: Lipase: 25 U/L (ref 11–51)

## 2016-05-07 LAB — PREGNANCY, URINE: PREG TEST UR: NEGATIVE

## 2016-05-07 MED ORDER — IBUPROFEN 800 MG PO TABS
800.0000 mg | ORAL_TABLET | Freq: Three times a day (TID) | ORAL | 0 refills | Status: DC
Start: 2016-05-07 — End: 2016-11-17

## 2016-05-07 MED ORDER — KETOROLAC TROMETHAMINE 30 MG/ML IJ SOLN
30.0000 mg | Freq: Once | INTRAMUSCULAR | Status: AC
  Administered 2016-05-07: 30 mg via INTRAVENOUS
  Filled 2016-05-07: qty 1

## 2016-05-07 MED FILL — IBUPROFEN 800 MG TABLET: 800 | 7 days supply | Qty: 21 | Fill #0

## 2016-05-07 NOTE — ED Provider Notes (Signed)
Emergency Department Provider Note   I have reviewed the triage vital signs and the nursing notes.   HISTORY  Chief Complaint Back Pain   HPI Lori Jensen is a 45 y.o. female with PMH of anxiety, HTN, and Dysmenorrhea presents to the emergency department for evaluation of left flank pain radiating around to the left abdomen. Patient states that symptoms began 3 days prior. She describes it as constant with intermittent exacerbation of pain. Pain not made significantly worse with movement. Patient does endorse worsening pain with bowel movements. She has had some urinary frequency. No gross hematuria. She has noticed some vaginal spotting and recent painful intercourse. No associated chest pain or difficulty breathing. No weakness or numbness in her legs. Her back pain is more left flank pain. Denies midline tenderness.    Past Medical History:  Diagnosis Date  . Anxiety   . Depression   . Dysmenorrhea   . Heart murmur   . Hypertension   . PONV (postoperative nausea and vomiting)     Patient Active Problem List   Diagnosis Date Noted  . SMOKER 07/23/2009  . PALPITATIONS 06/12/2009  . PANIC ATTACKS 11/24/2006  . DEPRESSIVE DISORDER, NOS 11/24/2006  . MITRAL VALVE PROLAPSE 11/24/2006  . CHEST PAIN 11/24/2006    Past Surgical History:  Procedure Laterality Date  . DILATION AND CURETTAGE OF UTERUS    . DILATION AND CURETTAGE OF UTERUS  01/16/2013   Procedure: DILATATION AND CURETTAGE;  Surgeon: Gus Height, MD;  Location: [facility];  Service: Gynecology;;  . uterine ablation  08/13/2011    Current Outpatient Rx  . Order #: [id number] Class: Historical Med  . Order #: [id number] Class: Historical Med  . Order #: [id number] Class: Historical Med  . Order #: [id number] Class: Historical Med  . Order #: [id number] Class: Historical Med  . Order #: [id number] Class: Print    Allergies Codeine; Amoxicillin; and Doxycycline  Family History  Problem Relation Age of Onset  . Heart  disease Mother   . Stomach cancer Father   . Heart disease Father   . Other Father     non hodgkins lymphoma  . Colon cancer Neg Hx     Social History Social History  Substance Use Topics  . Smoking status: Current Every Day Smoker    Packs/day: 0.50    Years: 20.00    Types: Cigarettes  . Smokeless tobacco: Never Used  . Alcohol use Yes     Comment: occasionally    Review of Systems  Constitutional: No fever/chills Eyes: No visual changes. ENT: No sore throat. Cardiovascular: Denies chest pain. Respiratory: Denies shortness of breath. Gastrointestinal: Positive left sided abdominal pain. No nausea, no vomiting. No diarrhea. No constipation. Genitourinary: Negative for dysuria. Positive urinary frequency and left flank pain.  Musculoskeletal: Negative for back pain. Skin: Negative for rash. Neurological: Negative for headaches, focal weakness or numbness.  10-point ROS otherwise negative.  ____________________________________________   PHYSICAL EXAM:  VITAL SIGNS: ED Triage Vitals  Enc Vitals Group     BP 05/07/16 1112 129/80     Pulse Rate 05/07/16 1112 90     Resp 05/07/16 1112 18     Temp 05/07/16 1112 98.3 F (36.8 C)     Temp Source 05/07/16 1112 Oral     SpO2 05/07/16 1112 100 %     Weight 05/07/16 1112 175 lb (79.4 kg)     Height 05/07/16 1112 5\' 6"  (1.676 m)     Pain Score 05/07/16 1121 6  Constitutional: Alert and oriented. Well appearing and in no acute distress. Eyes: Conjunctivae are normal. PERRL. EOMI. Head: Atraumatic. Nose: No congestion/rhinnorhea. Mouth/Throat: Mucous membranes are moist.  Oropharynx non-erythematous. Neck: No stridor.   Cardiovascular: Normal rate, regular rhythm. Good peripheral circulation. Grossly normal heart sounds.   Respiratory: Normal respiratory effort.  No retractions. Lungs CTAB. Gastrointestinal: Soft with very mild tenderness to palpation along the left upper and lower abdomen. No rebound or guarding. No  distention. No CVA tenderness.  Musculoskeletal: No lower extremity tenderness nor edema. No gross deformities of extremities. Neurologic:  Normal speech and language. No gross focal neurologic deficits are appreciated.  Skin:  Skin is warm, dry and intact. No rash noted. Psychiatric: Mood and affect are normal. Speech and behavior are normal.  ____________________________________________   LABS (all labs ordered are listed, but only abnormal results are displayed)  Labs Reviewed  COMPREHENSIVE METABOLIC PANEL - Abnormal; Notable for the following:       Result Value   Potassium 3.4 (*)    AST 14 (*)    ALT 13 (*)    All other components within normal limits  CBC WITH DIFFERENTIAL/PLATELET - Abnormal; Notable for the following:    RBC 5.12 (*)    Hemoglobin 15.1 (*)    All other components within normal limits  URINALYSIS, ROUTINE W REFLEX MICROSCOPIC (NOT AT [facility]) - Abnormal; Notable for the following:    Specific Gravity, Urine 1.004 (*)    Hgb urine dipstick MODERATE (*)    All other components within normal limits  URINE MICROSCOPIC-ADD ON - Abnormal; Notable for the following:    Squamous Epithelial / LPF 0-5 (*)    Bacteria, UA RARE (*)    All other components within normal limits  LIPASE, BLOOD  PREGNANCY, URINE   ____________________________________________  RADIOLOGY  None ____________________________________________   PROCEDURES  Procedure(s) performed:   Procedures  None ____________________________________________   INITIAL IMPRESSION / ASSESSMENT AND PLAN / ED COURSE  Pertinent labs & imaging results that were available during my care of the patient were reviewed by me and considered in my medical decision making (see chart for details).  Patient resents to the emergency department for evaluation of left flank pain radiating around to her left abdomen. Pain made worse with bowel movements. Patient had associated urinary frequency but no hematuria.  No history of kidney stones. Associated painful intercourse. Differential at this point is broad but low suspicion for spine emergency. Suspect possible new-onset kidney stone, colitis/enteritis, UTI, or endometriosis. Patient with some vaginal spotting but no discharge. Will give Toradol and follow labs and UA prior to deciding on imaging given patient's mild pain. No tenderness in the RUQ or RLQ to suggest early appendicitis or gallbladder pathology.   01:24 PM Patient is feeling much better after Toradol. Pain has resolved. Lab evaluation is unremarkable. No hematuria to suggest kidney stone. No evidence of urinary tract infection. Kidney function is normal. No evidence of biliary obstruction by labs or exam. Patient's abdomen is soft and nontender. I do not believe she requires further advanced imaging. Discussed that with associated painful intercourse she may consider OB/GYN follow-up for further evaluation of endometriosis. She will also discuss with her primary care physician. We'll discharge home with Motrin for pain and advised that she can alternate with Tylenol. Gave strict return precautions to the emergency department.  At this time, I do not feel there is any life-threatening condition present. I have reviewed and discussed all results (imaging, lab, urine  as appropriate), exam findings with patient. I have reviewed nursing notes and appropriate previous records.  I feel the patient is safe to be discharged home without further emergent workup. Discussed usual and customary return precautions. Patient and family (if present) verbalize understanding and are comfortable with this plan.  Patient will follow-up with their primary care provider. If they do not have a primary care provider, information for follow-up has been provided to them. All questions have been answered.  ____________________________________________  FINAL CLINICAL IMPRESSION(S) / ED DIAGNOSES  Final diagnoses:  Left upper  quadrant pain     MEDICATIONS GIVEN DURING THIS VISIT:  Medications  ketorolac (TORADOL) 30 MG/ML injection 30 mg (30 mg Intravenous Given 05/07/16 1159)     NEW OUTPATIENT MEDICATIONS STARTED DURING THIS VISIT:  None   Note:  This document was prepared using Dragon voice recognition software and may include unintentional dictation errors.  Nanda Quinton, MD Emergency Medicine   Margette Fast, MD 05/07/16 [phone]

## 2016-05-07 NOTE — Discharge Instructions (Signed)

## 2016-05-07 NOTE — ED Triage Notes (Addendum)
C/o low left back pain radiating to lower left abd pain. Frequent urination and bm's. No burning. Nausea, no vomiting or fever. Also has vag d/c of clear fluid and has had some spotting recently. Has had a novasure ablation and has started having pain and bleeding with sex.

## 2016-06-30 ENCOUNTER — Emergency Department (HOSPITAL_BASED_OUTPATIENT_CLINIC_OR_DEPARTMENT_OTHER): Payer: Managed Care, Other (non HMO)

## 2016-06-30 ENCOUNTER — Emergency Department (HOSPITAL_BASED_OUTPATIENT_CLINIC_OR_DEPARTMENT_OTHER)
Admission: EM | Admit: 2016-06-30 | Discharge: 2016-07-01 | Disposition: A | Discharge status: Home / Self Care | Payer: Managed Care, Other (non HMO) | Attending: Emergency Medicine | Admitting: Emergency Medicine

## 2016-06-30 ENCOUNTER — Encounter (HOSPITAL_BASED_OUTPATIENT_CLINIC_OR_DEPARTMENT_OTHER): Payer: Self-pay | Admitting: *Deleted

## 2016-06-30 DIAGNOSIS — Z79899 Other long term (current) drug therapy: Secondary | ICD-10-CM | POA: Diagnosis not present

## 2016-06-30 DIAGNOSIS — F17210 Nicotine dependence, cigarettes, uncomplicated: Secondary | ICD-10-CM | POA: Insufficient documentation

## 2016-06-30 DIAGNOSIS — K2100 Gastro-esophageal reflux disease with esophagitis, without bleeding: Secondary | ICD-10-CM

## 2016-06-30 DIAGNOSIS — M7989 Other specified soft tissue disorders: Secondary | ICD-10-CM | POA: Diagnosis not present

## 2016-06-30 DIAGNOSIS — I10 Essential (primary) hypertension: Secondary | ICD-10-CM | POA: Diagnosis not present

## 2016-06-30 DIAGNOSIS — R0602 Shortness of breath: Secondary | ICD-10-CM | POA: Diagnosis present

## 2016-06-30 MED ORDER — GI COCKTAIL ~~LOC~~
30.0000 mL | Freq: Once | ORAL | Status: AC
  Administered 2016-06-30: 30 mL via ORAL
  Filled 2016-06-30: qty 30

## 2016-06-30 NOTE — ED Notes (Signed)
Patient transported to X-ray 

## 2016-06-30 NOTE — ED Notes (Signed)
MD at bedside. 

## 2016-06-30 NOTE — ED Provider Notes (Signed)
[facility] DE[facility] Provider Note   CSN: [id number] Arrival date & time: 06/30/16  2042  By signing my name below, I, [name], attest that this documentation has been prepared under the direction and in the presence of Deno Etienne, DO  Electronically Signed: [name], ED Scribe. 06/30/16. 9:35 PM.  History   Chief Complaint Chief Complaint  Patient presents with  . Shortness of Breath    The history is provided by the patient. No language interpreter was used.   HPI Comments:  RAELEY Jensen is a 45 y.o. female, with a hx of anxiety, who presents to the Emergency Department complaining of SOB onset several days ago. Associated symptoms include nausea, chills, achy shoulder and arms, cough and leg swelling. She denies dysuria, abdominal pain, rhinorrhea, sore throat, ear pain vomiting and diarrhea. She notes her symptoms are resolved for about 30 minutes after eating and then they return. Pt denies any other symptoms at this time.   Past Medical History:  Diagnosis Date  . Anxiety   . Depression   . Dysmenorrhea   . Heart murmur   . Hypertension   . PONV (postoperative nausea and vomiting)     Patient Active Problem List   Diagnosis Date Noted  . SMOKER 07/23/2009  . PALPITATIONS 06/12/2009  . PANIC ATTACKS 11/24/2006  . DEPRESSIVE DISORDER, NOS 11/24/2006  . MITRAL VALVE PROLAPSE 11/24/2006  . CHEST PAIN 11/24/2006    Past Surgical History:  Procedure Laterality Date  . DILATION AND CURETTAGE OF UTERUS    . DILATION AND CURETTAGE OF UTERUS  01/16/2013   Procedure: DILATATION AND CURETTAGE;  Surgeon: Gus Height, MD;  Location: [facility];  Service: Gynecology;;  . ENDOMETRIAL ABLATION    . uterine ablation  08/13/2011    OB History    Gravida Para Term Preterm AB Living   7 2 2   5 2    SAB TAB Ectopic Multiple Live Births   3 2             Home Medications    Prior to Admission medications   Medication Sig Start Date End Date Taking? Authorizing  Provider  ALBUTEROL IN Inhale 2 puffs into the lungs every 6 (six) hours as needed (wheezing).     Historical Provider, MD  ALPRAZolam Duanne Moron) 0.25 MG tablet Take 0.25 mg by mouth daily as needed. For anxiety    Historical Provider, MD  buPROPion (WELLBUTRIN SR) 150 MG 12 hr tablet Take 150 mg by mouth 2 (two) times daily.    Historical Provider, MD  hydrochlorothiazide (HYDRODIURIL) 25 MG tablet Take 25 mg by mouth daily.  01/31/13   Historical Provider, MD  ibuprofen (ADVIL,MOTRIN) 800 MG tablet Take 1 tablet (800 mg total) by mouth 3 (three) times daily. 05/07/16   Margette Fast, MD  lisinopril (PRINIVIL,ZESTRIL) 10 MG tablet Take 10 mg by mouth daily.    Historical Provider, MD    Family History Family History  Problem Relation Age of Onset  . Heart disease Mother   . Stomach cancer Father   . Heart disease Father   . Other Father     non hodgkins lymphoma  . Colon cancer Neg Hx     Social History Social History  Substance Use Topics  . Smoking status: Current Every Day Smoker    Packs/day: 0.50    Years: 20.00    Types: Cigarettes  . Smokeless tobacco: Never Used  . Alcohol use Yes  Comment: occasionally     Allergies   Codeine; Amoxicillin; and Doxycycline   Review of Systems Review of Systems  Constitutional: Positive for chills. Negative for fever.  HENT: Negative for congestion, ear pain, rhinorrhea and sore throat.   Eyes: Negative for redness and visual disturbance.  Respiratory: Positive for cough and shortness of breath. Negative for wheezing.   Cardiovascular: Positive for leg swelling. Negative for chest pain and palpitations.  Gastrointestinal: Positive for nausea. Negative for abdominal pain, diarrhea and vomiting.  Genitourinary: Negative for dysuria and urgency.  Musculoskeletal: Negative for arthralgias and myalgias.  Skin: Negative for pallor and wound.  Neurological: Negative for dizziness and headaches.     Physical Exam Updated Vital  Signs BP 111/70 (BP Location: Right Arm)   Pulse 75   Temp 98.1 F (36.7 C) (Oral)   Resp 18   Ht 5\' 6"  (1.676 m)   Wt 170 lb (77.1 kg)   SpO2 100%   BMI 27.44 kg/m   Physical Exam  Constitutional: She is oriented to person, place, and time. She appears well-developed and well-nourished. No distress.  HENT:  Head: Normocephalic and atraumatic.  Eyes: EOM are normal. Pupils are equal, round, and reactive to light.  Neck: Normal range of motion. Neck supple.  Cardiovascular: Normal rate and regular rhythm.  Exam reveals no gallop and no friction rub.   No murmur heard. Pulmonary/Chest: Effort normal. She has no wheezes. She has no rales.  Abdominal: Soft. She exhibits no distension. There is no tenderness.  Musculoskeletal: She exhibits no edema or tenderness.  Neurological: She is alert and oriented to person, place, and time.  Skin: Skin is warm and dry. She is not diaphoretic.  Psychiatric: She has a normal mood and affect. Her behavior is normal.  Nursing note and vitals reviewed.    ED Treatments / Results  DIAGNOSTIC STUDIES:  Oxygen Saturation is 100% on RA, normal by my interpretation.    COORDINATION OF CARE:  11:56 PM Discussed treatment plan with pt at bedside and pt agreed to plan.  Labs (all labs ordered are listed, but only abnormal results are displayed) Labs Reviewed - No data to display  EKG  EKG Interpretation  Date/Time:  Wednesday June 30 2016 20:54:41 EDT Ventricular Rate:  73 PR Interval:    QRS Duration: 84 QT Interval:  410 QTC Calculation: 452 R Axis:   56 Text Interpretation:  Sinus rhythm Probable left atrial enlargement No significant change since last tracing Confirmed by Fredda Clarida MD, DANIEL [phone]) on 06/30/2016 9:03:41 PM       Radiology Dg Chest 2 View  Result Date: 06/30/2016 CLINICAL DATA:  Dyspnea for several days nausea, chills and cough. EXAM: CHEST  2 VIEW COMPARISON:  03/19/2015 FINDINGS: The heart size and mediastinal  contours are within normal limits. Both lungs are clear. The visualized skeletal structures are unremarkable. IMPRESSION: No active cardiopulmonary disease. Electronically Signed   By: Ashley Royalty M.D.   On: 06/30/2016 23:41    Procedures Procedures (including critical care time)  Medications Ordered in ED Medications  gi cocktail (Maalox,Lidocaine,Donnatal) (30 mLs Oral Given 06/30/16 2157)     Initial Impression / Assessment and Plan / ED Course  I have reviewed the triage vital signs and the nursing notes.  Pertinent labs & imaging results that were available during my care of the patient were reviewed by me and considered in my medical decision making (see chart for details).  Clinical Course    45 yo F  With a chief complaint of shortness of breath. Going on for the past week. Patient is unsure what makes this better or worse. Has sudden sensations of it. It is mildly improved just after eating and then worse about 30 minutes later. Patient describes it as a clearing of her throat. Suspect that this is reflux disease. We'll give the patient a GI cocktail. Chest x-ray to further evaluate shortness of breath. EKG is unremarkable.  CXR negative.   11:56 PM:  I have discussed the diagnosis/risks/treatment options with the patient and believe the pt to be eligible for discharge home to follow-up with PCP. We also discussed returning to the ED immediately if new or worsening sx occur. We discussed the sx which are most concerning (e.g., sudden worsening pain, fever, inability to tolerate by mouth) that necessitate immediate return. Medications administered to the patient during their visit and any new prescriptions provided to the patient are listed below.  Medications given during this visit Medications  gi cocktail (Maalox,Lidocaine,Donnatal) (30 mLs Oral Given 06/30/16 2157)     The patient appears reasonably screen and/or stabilized for discharge and I doubt any other medical  condition or other [facility] requiring further screening, evaluation, or treatment in the ED at this time prior to discharge.    Final Clinical Impressions(s) / ED Diagnoses   Final diagnoses:  Reflux esophagitis    New Prescriptions New Prescriptions   No medications on file  I personally performed the services described in this documentation, which was scribed in my presence. The recorded information has been reviewed and is accurate.      Deno Etienne, DO 06/30/16 2356

## 2016-06-30 NOTE — Discharge Instructions (Signed)
Try zantac 150mg twice a day.  ° ° °

## 2016-06-30 NOTE — ED Notes (Signed)
Pt c/o SOB and anxiety with chronic cough for the last several months.

## 2016-06-30 NOTE — ED Triage Notes (Signed)
Pt c/o palpitations and SOB x 1 week

## 2016-07-01 NOTE — ED Notes (Signed)
Pt verbalizes understanding of d/c instructions and denies any further needs at this time. 

## 2016-11-10 DIAGNOSIS — N926 Irregular menstruation, unspecified: Secondary | ICD-10-CM | POA: Insufficient documentation

## 2016-11-17 ENCOUNTER — Encounter (HOSPITAL_COMMUNITY): Payer: Self-pay

## 2016-11-17 ENCOUNTER — Inpatient Hospital Stay (HOSPITAL_COMMUNITY): Payer: Managed Care, Other (non HMO)

## 2016-11-17 ENCOUNTER — Inpatient Hospital Stay (HOSPITAL_COMMUNITY)
Admission: AD | Admit: 2016-11-17 | Discharge: 2016-11-17 | Disposition: A | Discharge status: Home / Self Care | Payer: Managed Care, Other (non HMO) | Source: Ambulatory Visit | Attending: Obstetrics & Gynecology | Admitting: Obstetrics & Gynecology

## 2016-11-17 DIAGNOSIS — Z880 Allergy status to penicillin: Secondary | ICD-10-CM | POA: Diagnosis not present

## 2016-11-17 DIAGNOSIS — K5900 Constipation, unspecified: Secondary | ICD-10-CM | POA: Diagnosis not present

## 2016-11-17 DIAGNOSIS — Z87891 Personal history of nicotine dependence: Secondary | ICD-10-CM | POA: Diagnosis not present

## 2016-11-17 DIAGNOSIS — R1032 Left lower quadrant pain: Secondary | ICD-10-CM

## 2016-11-17 DIAGNOSIS — R102 Pelvic and perineal pain: Secondary | ICD-10-CM

## 2016-11-17 LAB — WET PREP, GENITAL
Clue Cells Wet Prep HPF POC: NONE SEEN
Sperm: NONE SEEN
Trich, Wet Prep: NONE SEEN
Yeast Wet Prep HPF POC: NONE SEEN

## 2016-11-17 LAB — CBC
HCT: 40.7 % (ref 36.0–46.0)
Hemoglobin: 14.2 g/dL (ref 12.0–15.0)
MCH: 28.6 pg (ref 26.0–34.0)
MCHC: 34.9 g/dL (ref 30.0–36.0)
MCV: 82.1 fL (ref 78.0–100.0)
PLATELETS: 208 10*3/uL (ref 150–400)
RBC: 4.96 MIL/uL (ref 3.87–5.11)
RDW: 13.4 % (ref 11.5–15.5)
WBC: 7.8 10*3/uL (ref 4.0–10.5)

## 2016-11-17 LAB — URINALYSIS, ROUTINE W REFLEX MICROSCOPIC
BILIRUBIN URINE: NEGATIVE
GLUCOSE, UA: NEGATIVE mg/dL
Hgb urine dipstick: NEGATIVE
KETONES UR: NEGATIVE mg/dL
Leukocytes, UA: NEGATIVE
Nitrite: NEGATIVE
PH: 7 (ref 5.0–8.0)
Protein, ur: NEGATIVE mg/dL
Specific Gravity, Urine: 1.002 — ABNORMAL LOW (ref 1.005–1.030)

## 2016-11-17 LAB — POCT PREGNANCY, URINE: Preg Test, Ur: NEGATIVE

## 2016-11-17 MED ORDER — BISACODYL 5 MG PO TBEC: 5.0000 mg | DELAYED_RELEASE_TABLET | Freq: Every day | ORAL | 0 refills | Status: DC | PRN

## 2016-11-17 NOTE — MAU Note (Signed)
Having severe pain in LLQ, radiating into leg.  Started 2 nights ago. Worse today.  Had an ablation several yrs ago.  Typically only has spotting, but last week on Wed- passed several large clots. Only lasted a few hours.  Has been constipated for about a wk.  Feeling nauseated

## 2016-11-17 NOTE — MAU Provider Note (Signed)
History     CSN: [id number]  Arrival date and time: 11/17/16 1614   None     Chief Complaint  Patient presents with  . Abdominal Pain   HPI 46 yo [id number] presenting today for the evaluation of abdominal pain for the past 4 days. Patient describes the pain as Lori Jensen, localized in her LLQ. She sits for work and thus does not feel she pulled a muscle. She underwent an endometrial ablation in 07/2011 and has been amenorrheic since. However, she experienced vaginal bleeding a few days ago which lasted 3-4 days. The bleeding was heavy with passage of clots. Patient also reports some constipation for the past 4 days. She is sexually active with the same partner and denies any abnormal discharge     Past Medical History:  Diagnosis Date  . Anxiety   . Depression   . Dysmenorrhea   . Heart murmur   . Hypertension   . PONV (postoperative nausea and vomiting)     Past Surgical History:  Procedure Laterality Date  . DILATION AND CURETTAGE OF UTERUS    . DILATION AND CURETTAGE OF UTERUS  01/16/2013   Procedure: DILATATION AND CURETTAGE;  Surgeon: Gus Height, MD;  Location: [facility];  Service: Gynecology;;  . ENDOMETRIAL ABLATION    . uterine ablation  08/13/2011    Family History  Problem Relation Age of Onset  . Heart disease Mother   . Stomach cancer Father   . Heart disease Father   . Other Father     non hodgkins lymphoma  . Colon cancer Neg Hx     Social History  Substance Use Topics  . Smoking status: Former Smoker    Packs/day: 0.50    Years: 20.00    Types: Cigarettes    Quit date: 08/13/2016  . Smokeless tobacco: Never Used  . Alcohol use Yes     Comment: occasionally    Allergies:  Allergies  Allergen Reactions  . Codeine Anaphylaxis  . Amoxicillin Rash    Has patient had a PCN reaction causing immediate rash, facial/tongue/throat swelling, SOB or lightheadedness with hypotension: no Has patient had a PCN reaction causing severe rash involving mucus  membranes or skin necrosis: no Has patient had a PCN reaction that required hospitalization no Has patient had a PCN reaction occurring within the last 10 years: yes If all of the above answers are "NO", then may proceed with Cephalosporin use.   Marland Kitchen Doxycycline Rash    Prescriptions Prior to Admission  Medication Sig Dispense Refill Last Dose  . ALBUTEROL IN Inhale 2 puffs into the lungs every 6 (six) hours as needed (wheezing).    Past Week at Unknown time  . buPROPion (WELLBUTRIN SR) 150 MG 12 hr tablet Take 300 mg by mouth every morning.    11/17/2016 at Unknown time  . DULoxetine (CYMBALTA) 30 MG capsule Take 30 mg by mouth daily.   11/17/2016 at Unknown time  . hydrochlorothiazide (HYDRODIURIL) 25 MG tablet Take 25 mg by mouth daily.    11/17/2016 at Unknown time  . ibuprofen (ADVIL,MOTRIN) 200 MG tablet Take 400 mg by mouth every 6 (six) hours as needed for mild pain or moderate pain.   11/16/2016 at Unknown time  . lisinopril (PRINIVIL,ZESTRIL) 10 MG tablet Take 10 mg by mouth daily.   11/17/2016 at Unknown time  . ALPRAZolam (XANAX) 0.25 MG tablet Take 0.25 mg by mouth daily as needed. For anxiety   Not Taking at Unknown time  . ibuprofen (  ADVIL,MOTRIN) 800 MG tablet Take 1 tablet (800 mg total) by mouth 3 (three) times daily. (Patient not taking: Reported on 11/17/2016) 21 tablet 0 Not Taking at Unknown time    Review of Systems  See pertinent in HPI Physical Exam   Blood pressure 133/98, pulse 77, temperature 99.7 F (37.6 C), temperature source Oral, resp. rate 20, weight 198 lb 4 oz (89.9 kg), last menstrual period 11/10/2016.  Physical Exam GENERAL: Well-developed, well-nourished female in no acute distress.  ABDOMEN: Soft, mild left lower quadrant tenderness, nondistended.  PELVIC: Normal external female genitalia. Vagina is pink and rugated.  Normal discharge. Normal appearing cervix. Uterus is normal in size. No adnexal mass or tenderness. Palpable stool in rectal  vault EXTREMITIES: No cyanosis, clubbing, or edema, 2+ distal pulses.  MAU Course  Procedures  MDM CBC    Component Value Date/Time   WBC 7.8 11/17/2016 1728   RBC 4.96 11/17/2016 1728   HGB 14.2 11/17/2016 1728   HCT 40.7 11/17/2016 1728   PLT 208 11/17/2016 1728   MCV 82.1 11/17/2016 1728   MCH 28.6 11/17/2016 1728   MCHC 34.9 11/17/2016 1728   RDW 13.4 11/17/2016 1728   LYMPHSABS 2.5 05/07/2016 1150   MONOABS 0.5 05/07/2016 1150   EOSABS 0.4 05/07/2016 1150   BASOSABS 0.0 05/07/2016 1150   Wet prep is negative  US Transvaginal Non-ob  Result Date: 11/17/2016 CLINICAL DATA:  Severe left lower quadrant pain for 2 days EXAM: TRANSABDOMINAL AND TRANSVAGINAL ULTRASOUND OF PELVIS TECHNIQUE: Both transabdominal and transvaginal ultrasound examinations of the pelvis were performed. Transabdominal technique was performed for global imaging of the pelvis including uterus, ovaries, adnexal regions, and pelvic cul-de-sac. It was necessary to proceed with endovaginal exam following the transabdominal exam to visualize the endometrium and ovaries. COMPARISON:  MRI  01/13/2013, pelvic ultrasound 01/09/2013 FINDINGS: Uterus Measurements: 10.8 x 6.5 x 8.3 cm. There are at least 3 myometrial masses visualized. Hypoechoic mass in the right posterior fundal region measures 1.9 x 1.6 by 2.4 cm trans abdominal. Hypoechoic circumscribed anterior fundal mass measures 2.5 x 2.5 x 2.5 cm. Left lower uterine segment mass measures 3.3 x 3 x 3.0 cm. Endometrium Thickness: 18 mm.  Heterogenous with cystic thickening. Right ovary Measurements: 3.8 x 2.6 x 3.9 cm. Simple cyst measuring 3.5 by 2.5 by 3.3 cm. Left ovary Measurements: Nonvisualized. Normal appearance/no adnexal mass. Other findings No abnormal free fluid. IMPRESSION: 1. Nonvisualization of left ovary 2. At least 3 myometrial masses are visualized, consistent with fibroids 3. Heterogenous cystic thickening of the endometrium. Endometrial thickness is  considered abnormal. Consider follow-up by [place] in 6-8 weeks, during the week immediately following menses (exam timing is critical). Electronically Signed   By: Donavan Foil M.D.   On: 11/17/2016 18:18   US Pelvis Complete  Result Date: 11/17/2016 CLINICAL DATA:  Severe left lower quadrant pain for 2 days EXAM: TRANSABDOMINAL AND TRANSVAGINAL ULTRASOUND OF PELVIS TECHNIQUE: Both transabdominal and transvaginal ultrasound examinations of the pelvis were performed. Transabdominal technique was performed for global imaging of the pelvis including uterus, ovaries, adnexal regions, and pelvic cul-de-sac. It was necessary to proceed with endovaginal exam following the transabdominal exam to visualize the endometrium and ovaries. COMPARISON:  MRI  01/13/2013, pelvic ultrasound 01/09/2013 FINDINGS: Uterus Measurements: 10.8 x 6.5 x 8.3 cm. There are at least 3 myometrial masses visualized. Hypoechoic mass in the right posterior fundal region measures 1.9 x 1.6 by 2.4 cm trans abdominal. Hypoechoic circumscribed anterior fundal mass measures 2.5 x 2.5  x 2.5 cm. Left lower uterine segment mass measures 3.3 x 3 x 3.0 cm. Endometrium Thickness: 18 mm.  Heterogenous with cystic thickening. Right ovary Measurements: 3.8 x 2.6 x 3.9 cm. Simple cyst measuring 3.5 by 2.5 by 3.3 cm. Left ovary Measurements: Nonvisualized. Normal appearance/no adnexal mass. Other findings No abnormal free fluid. IMPRESSION: 1. Nonvisualization of left ovary 2. At least 3 myometrial masses are visualized, consistent with fibroids 3. Heterogenous cystic thickening of the endometrium. Endometrial thickness is considered abnormal. Consider follow-up by [place] in 6-8 weeks, during the week immediately following menses (exam timing is critical). Electronically Signed   By: Donavan Foil M.D.   On: 11/17/2016 18:18     Assessment and Plan  46 yo with LLQ pain - Results reviewed with the patient. No evidence of infectious process. Pelvic ultrasound  demonstrates the presence of 3 small fibroids. No evidence of ovarian pathology. Endometrium with cystic changes seen in patient s/p endometrial ablation. - LLQ pain is likely secondary to constipation - Precautions reviewed - Follow up with PCP - RTC prn  Lori Jensen 11/17/2016, 6:26 PM

## 2016-11-17 NOTE — Discharge Instructions (Signed)

## 2016-11-18 LAB — GC/CHLAMYDIA PROBE AMP (~~LOC~~) NOT AT ARMC
CHLAMYDIA, DNA PROBE: NEGATIVE
NEISSERIA GONORRHEA: NEGATIVE

## 2017-01-20 ENCOUNTER — Ambulatory Visit (HOSPITAL_COMMUNITY)
Admission: RE | Admit: 2017-01-20 | Discharge: 2017-01-20 | Disposition: A | Discharge status: Home / Self Care | Payer: 59 | Attending: Psychiatry | Admitting: Psychiatry

## 2017-01-20 DIAGNOSIS — F333 Major depressive disorder, recurrent, severe with psychotic symptoms: Secondary | ICD-10-CM | POA: Diagnosis not present

## 2017-01-20 NOTE — BH Assessment (Signed)
Tele Assessment Note   Lori Jensen is an 46 y.o. female presenting to [facility] with depressed mood, tearfulness, good eye contact, severe anxiety, feels hopeless, good judgement and insight. Expressed some mild visual and tactile hallucinations, last time was a month ago. The patient denied SI or HI.     The patient was seen six years ago at the [facility] for depression. Reports anxiety and depression for many years. States she either feels really low or has periods of time she is very talkative. Reports spending money she doesn't have on online shopping. The patient has been working at her job for 20 yrs, missed several days of work lately because was unable to get out of bed. States four out of seven days a week she's severely depressed.   The patient has been seen by her PCP for the last year. Started on anti-depressants. Has panic attacks. Took xanax in the past but this makes her agitated. Has gained 50 lbs in 8 months. Admits to overeating. Drinks occasionally but no regular use.   The patient lives with her son, 36 yr old. Also has a 45 yr old daughter. States she is still grieving over her brothers death in 01/17/13. Also, reports begin in an unhealthy relationship.     Lori Blossom, NP offered OBS and IOP. Patient opted for IOP only.     Diagnosis: Major depressive disorder, recurrent severe, with psychotic features  Past Medical History:  Past Medical History:  Diagnosis Date  . Anxiety   . Depression   . Dysmenorrhea   . Heart murmur   . Hypertension   . PONV (postoperative nausea and vomiting)     Past Surgical History:  Procedure Laterality Date  . DILATION AND CURETTAGE OF UTERUS    . DILATION AND CURETTAGE OF UTERUS  01/16/2013   Procedure: DILATATION AND CURETTAGE;  Surgeon: Gus Height, MD;  Location: [facility];  Service: Gynecology;;  . ENDOMETRIAL ABLATION    . uterine ablation  08/13/2011    Family History:  Family History  Problem Relation Age of Onset   . Heart disease Mother   . Stomach cancer Father   . Heart disease Father   . Other Father     non hodgkins lymphoma  . Colon cancer Neg Hx     Social History:  reports that she quit smoking about 5 months ago. Her smoking use included Cigarettes. She has a 10.00 pack-year smoking history. She has never used smokeless tobacco. She reports that she drinks alcohol. She reports that she does not use drugs.  Additional Social History:  Alcohol / Drug Use Pain Medications: see MAR Prescriptions: see MAR Over the Counter: see MAR History of alcohol / drug use?: No history of alcohol / drug abuse  CIWA: CIWA-Ar BP: 135/85 Pulse Rate: 74 COWS:    PATIENT STRENGTHS: (choose at least two) Average or above average intelligence Motivation for treatment/growth  Allergies:  Allergies  Allergen Reactions  . Codeine Anaphylaxis  . Amoxicillin Rash    Has patient had a PCN reaction causing immediate rash, facial/tongue/throat swelling, SOB or lightheadedness with hypotension: no Has patient had a PCN reaction causing severe rash involving mucus membranes or skin necrosis: no Has patient had a PCN reaction that required hospitalization no Has patient had a PCN reaction occurring within the last 10 years: yes If all of the above answers are "NO", then may proceed with Cephalosporin use.   Marland Kitchen Doxycycline Rash    Home Medications:  (  Not in a hospital admission)  OB/GYN Status:  No LMP recorded. Patient is not currently having periods (Reason: Irregular Periods).  General Assessment Data Location of Assessment: [facility] Assessment Services TTS Assessment: In system Is this a Tele or Face-to-Face Assessment?: Face-to-Face Is this an Initial Assessment or a Re-assessment for this encounter?: Initial Assessment Is patient pregnant?: No Pregnancy Status: No Living Arrangements: Children Can pt return to current living arrangement?: Yes Admission Status: Voluntary Is patient capable of signing  voluntary admission?: Yes Referral Source: MD Insurance type: [employer]  Medical Screening Exam ([place]) Medical Exam completed: Yes  Crisis Care Plan Living Arrangements: Children Name of Psychiatrist: n/a Name of Therapist: n/a  Education Status Is patient currently in school?: No  Risk to self with the past 6 months Suicidal Ideation: No Has patient been a risk to self within the past 6 months prior to admission? : No Suicidal Intent: No Has patient had any suicidal intent within the past 6 months prior to admission? : No Is patient at risk for suicide?: No Suicidal Plan?: No Has patient had any suicidal plan within the past 6 months prior to admission? : No Access to Means: No What has been your use of drugs/alcohol within the last 12 months?: n/a Previous Attempts/Gestures: No How many times?: 0 Other Self Harm Risks: 0 Intentional Self Injurious Behavior: None Family Suicide History: Unknown Recent stressful life event(s): Loss (Comment), Other (Comment) Persecutory voices/beliefs?: No Depression: Yes Depression Symptoms: Tearfulness, Isolating, Fatigue, Loss of interest in usual pleasures, Feeling worthless/self pity Substance abuse history and/or treatment for substance abuse?: No Suicide prevention information given to non-admitted patients: Not applicable  Risk to Others within the past 6 months Homicidal Ideation: No Does patient have any lifetime risk of violence toward others beyond the six months prior to admission? : No Thoughts of Harm to Others: No Current Homicidal Intent: No Current Homicidal Plan: No Access to Homicidal Means: No History of harm to others?: No Assessment of Violence: None Noted Does patient have access to weapons?: No Criminal Charges Pending?: No Does patient have a court date: No Is patient on probation?: No  Psychosis Hallucinations: Tactile, Visual Delusions: None noted  Mental Status Report Appearance/Hygiene:  Unremarkable Eye Contact: Good Motor Activity: Freedom of movement Speech: Logical/coherent Level of Consciousness: Alert Mood: Depressed Affect: Depressed, Sad Anxiety Level: Severe Thought Processes: Coherent, Relevant Judgement: Unimpaired Orientation: Person, Place, Time, Situation Obsessive Compulsive Thoughts/Behaviors: None  Cognitive Functioning Concentration: Normal Memory: Recent Intact, Remote Intact IQ: Average Insight: Fair Impulse Control: Fair Appetite: Good Sleep: Decreased Vegetative Symptoms: Staying in bed  ADLScreening [facility] Assessment Services) Patient's cognitive ability adequate to safely complete daily activities?: Yes Patient able to express need for assistance with ADLs?: Yes Independently performs ADLs?: Yes (appropriate for developmental age)  Prior Inpatient Therapy Prior Inpatient Therapy: No  Prior Outpatient Therapy Prior Outpatient Therapy: Yes Prior Therapy Dates: 6 yrs ago Prior Therapy Facilty[facility]Provider[facility] Reason for Treatment: depression Does patient have an ACCT team?: No Does patient have Intensive In-House Services?  : No Does patient have [facility] services? : No Does patient have P4CC services?: No  ADL Screening (condition at time of admission) Patient's cognitive ability adequate to safely complete daily activities?: Yes Is the patient deaf or have difficulty hearing?: No Does the patient have difficulty seeing, even when wearing glasses/contacts?: No Does the patient have difficulty concentrating, remembering, or making decisions?: No Patient able to express need for assistance with ADLs?: Yes Does  the patient have difficulty dressing or bathing?: No Independently performs ADLs?: Yes (appropriate for developmental age)             Regulatory affairs officer (For Healthcare) Does Patient Have a Medical Advance Directive?: No    Additional Information 1:1 In Past 12 Months?: No CIRT Risk: No Elopement  Risk: No Does patient have medical clearance?: Yes     Disposition:  Disposition Initial Assessment Completed for this Encounter: Yes Disposition of Patient: Outpatient treatment Type of outpatient treatment: Psych Intensive Outpatient  [place] 01/20/2017 6:38 PM

## 2017-01-20 NOTE — H&P (Signed)
Behavioral Health Medical Screening Exam  Lori Jensen is an 46 y.o. female.  Total Time spent with patient: 20 minutes  Psychiatric Specialty Exam: Physical Exam  Constitutional: She is oriented to person, place, and time. She appears well-developed and well-nourished.  HENT:  Head: Normocephalic.  Right Ear: External ear normal.  Left Ear: External ear normal.  Neck: Normal range of motion.  Cardiovascular: Normal rate, normal heart sounds and intact distal pulses.   Respiratory: Effort normal and breath sounds normal.  GI: Soft. Bowel sounds are normal.  Musculoskeletal: Normal range of motion.  Neurological: She is alert and oriented to person, place, and time.  Skin: Skin is warm and dry.    Review of Systems  Psychiatric/Behavioral: Positive for depression. Negative for hallucinations, memory loss, substance abuse and suicidal ideas. The patient is nervous/anxious. The patient does not have insomnia.     Blood pressure 135/85, pulse 74, temperature 98.7 F (37.1 C), temperature source Oral, resp. rate 18, SpO2 100 %.There is no height or weight on file to calculate BMI.  General Appearance: Casual and Fairly Groomed  Eye Contact:  Good  Speech:  Clear and Coherent and Normal Rate  Volume:  Normal  Mood:  Anxious and Depressed  Affect:  Congruent and Depressed  Thought Process:  Coherent, Goal Directed and Linear  Orientation:  Full (Time, Place, and Person)  Thought Content:  Logical  Suicidal Thoughts:  No  Homicidal Thoughts:  No  Memory:  Immediate;   Good Recent;   Good Remote;   Fair  Judgement:  Good  Insight:  Present  Psychomotor Activity:  Normal  Concentration: Concentration: Good and Attention Span: Good  Recall:  Good  Fund of Knowledge:Good  Language: Good  Akathisia:  No  Handed:  Right  AIMS (if indicated):     Assets:  Communication Skills Desire for Improvement Financial Resources/Insurance Housing Physical Health Social  Support Transportation Vocational/Educational  Sleep:       Musculoskeletal: Strength & Muscle Tone: within normal limits Gait & Station: normal Patient leans: N/A  Blood pressure 135/85, pulse 74, temperature 98.7 F (37.1 C), temperature source Oral, resp. rate 18, SpO2 100 %.  Recommendations:  Based on my evaluation the patient does not appear to have an emergency medical condition.    Ethelene Hal, NP 01/20/2017, 6:18 PM

## 2017-01-23 ENCOUNTER — Emergency Department (HOSPITAL_COMMUNITY)
Admission: EM | Admit: 2017-01-23 | Discharge: 2017-01-23 | Disposition: A | Discharge status: Home / Self Care | Payer: Managed Care, Other (non HMO) | Attending: Emergency Medicine | Admitting: Emergency Medicine

## 2017-01-23 ENCOUNTER — Encounter (HOSPITAL_COMMUNITY): Payer: Self-pay

## 2017-01-23 DIAGNOSIS — Z87891 Personal history of nicotine dependence: Secondary | ICD-10-CM | POA: Insufficient documentation

## 2017-01-23 DIAGNOSIS — I10 Essential (primary) hypertension: Secondary | ICD-10-CM | POA: Diagnosis not present

## 2017-01-23 DIAGNOSIS — Z79899 Other long term (current) drug therapy: Secondary | ICD-10-CM | POA: Diagnosis not present

## 2017-01-23 DIAGNOSIS — M545 Low back pain, unspecified: Secondary | ICD-10-CM

## 2017-01-23 LAB — URINALYSIS, ROUTINE W REFLEX MICROSCOPIC
Bilirubin Urine: NEGATIVE
GLUCOSE, UA: NEGATIVE mg/dL
HGB URINE DIPSTICK: NEGATIVE
Ketones, ur: NEGATIVE mg/dL
Leukocytes, UA: NEGATIVE
NITRITE: NEGATIVE
PH: 6 (ref 5.0–8.0)
PROTEIN: 30 mg/dL — AB
SPECIFIC GRAVITY, URINE: 1.028 (ref 1.005–1.030)

## 2017-01-23 LAB — POC URINE PREG, ED: Preg Test, Ur: NEGATIVE

## 2017-01-23 MED ORDER — HYDROCODONE-ACETAMINOPHEN 5-325 MG PO TABS: 1.0000 | ORAL_TABLET | ORAL | 0 refills | Status: DC | PRN

## 2017-01-23 MED ORDER — METHOCARBAMOL 500 MG PO TABS: 500.0000 mg | ORAL_TABLET | Freq: Two times a day (BID) | ORAL | 0 refills | Status: DC

## 2017-01-23 MED ORDER — METHOCARBAMOL 500 MG PO TABS
500.0000 mg | ORAL_TABLET | Freq: Once | ORAL | Status: AC
  Administered 2017-01-23: 500 mg via ORAL
  Filled 2017-01-23: qty 1

## 2017-01-23 MED ORDER — KETOROLAC TROMETHAMINE 30 MG/ML IJ SOLN
30.0000 mg | Freq: Once | INTRAMUSCULAR | Status: AC
  Administered 2017-01-23: 30 mg via INTRAMUSCULAR
  Filled 2017-01-23: qty 1

## 2017-01-23 MED ORDER — OXYCODONE-ACETAMINOPHEN 5-325 MG PO TABS
1.0000 | ORAL_TABLET | Freq: Once | ORAL | Status: AC
  Administered 2017-01-23: 1 via ORAL
  Filled 2017-01-23: qty 1

## 2017-01-23 MED ORDER — IBUPROFEN 600 MG PO TABS: 600.0000 mg | ORAL_TABLET | Freq: Four times a day (QID) | ORAL | 0 refills | Status: DC | PRN

## 2017-01-23 NOTE — ED Triage Notes (Signed)
Lower back pain radiates down left leg no bowel or bladder problems voiced states been hurting for about a month worse after moving wrong today steady gait noted.

## 2017-01-23 NOTE — ED Provider Notes (Signed)
[facility] DEPT Provider Note   CSN: [id number] Arrival date & time: 01/23/17  1909 By signing my name below, I, Gaspar Cola, attest that this documentation has been prepared under the direction and in the presence of non physician provider, Harlene Ramus, PA-C. Electronically Signed: Gaspar Cola, Scribe. 01/23/2017. 7:50 PM   History   Chief Complaint Chief Complaint  Patient presents with  . Back Pain   HPI Lori Jensen is a 47 y.o. female with a history of high blood pressure, depression, and anxiety who presents to the Emergency Department complaining of sudden onset, constant, moderat  Left sided lumbar back pain that radiates to the left side of her leg onset today. Per pt, she has had intermittent, mild pain to the area for several months. Today, she was stretching to alleviate her back pain and reports that her pain began after twisting during these stretches. Pt has tried ibuprofen with no relief. Pt is able to ambulate, but notes increased pain with movement. No recent fall or injury. Pt has not seen an acupuncturist or chiropractor for these symptoms. No hx cancer or IV drug use.  Pt denies any fever, chest pain, abdominal pain, vomiting, diarrhea, constipation, hematochezia, saddle anesthesia, incontinence of urine or stool, dysuria, hematuria, numbness, tingling, or weakness.    The history is provided by the patient. No language interpreter was used.   Past Medical History:  Diagnosis Date  . Anxiety   . Depression   . Dysmenorrhea   . Heart murmur   . Hypertension   . PONV (postoperative nausea and vomiting)     Patient Active Problem List   Diagnosis Date Noted  . SMOKER 07/23/2009  . PALPITATIONS 06/12/2009  . PANIC ATTACKS 11/24/2006  . DEPRESSIVE DISORDER, NOS 11/24/2006  . MITRAL VALVE PROLAPSE 11/24/2006  . CHEST PAIN 11/24/2006    Past Surgical History:  Procedure Laterality Date  . DILATION AND CURETTAGE OF UTERUS    . DILATION AND  CURETTAGE OF UTERUS  01/16/2013   Procedure: DILATATION AND CURETTAGE;  Surgeon: Gus Height, MD;  Location: [facility];  Service: Gynecology;;  . ENDOMETRIAL ABLATION    . uterine ablation  08/13/2011    OB History    Gravida Para Term Preterm AB Living   7 2 2   5 2    SAB TAB Ectopic Multiple Live Births   3 2             Home Medications    Prior to Admission medications   Medication Sig Start Date End Date Taking? Authorizing Provider  ALBUTEROL IN Inhale 2 puffs into the lungs every 6 (six) hours as needed (wheezing).     Historical Provider, MD  ALPRAZolam Duanne Moron) 0.25 MG tablet Take 0.25 mg by mouth daily as needed. For anxiety    Historical Provider, MD  bisacodyl (BISACODYL) 5 MG EC tablet Take 1 tablet (5 mg total) by mouth daily as needed for moderate constipation. 11/17/16   Peggy Constant, MD  buPROPion (WELLBUTRIN SR) 150 MG 12 hr tablet Take 300 mg by mouth every morning.     Historical Provider, MD  DULoxetine (CYMBALTA) 30 MG capsule Take 30 mg by mouth daily.    Historical Provider, MD  hydrochlorothiazide (HYDRODIURIL) 25 MG tablet Take 25 mg by mouth daily.  01/31/13   Historical Provider, MD  HYDROcodone-acetaminophen (NORCO/VICODIN) 5-325 MG tablet Take 1 tablet by mouth every 4 (four) hours as needed. 01/23/17   Nona Dell, PA-C  ibuprofen (ADVIL,MOTRIN) 600  MG tablet Take 1 tablet (600 mg total) by mouth every 6 (six) hours as needed. 01/23/17   Nona Dell, PA-C  lisinopril (PRINIVIL,ZESTRIL) 10 MG tablet Take 10 mg by mouth daily.    Historical Provider, MD  methocarbamol (ROBAXIN) 500 MG tablet Take 1 tablet (500 mg total) by mouth 2 (two) times daily. 01/23/17   Nona Dell, PA-C    Family History Family History  Problem Relation Age of Onset  . Heart disease Mother   . Stomach cancer Father   . Heart disease Father   . Other Father     non hodgkins lymphoma  . Colon cancer Neg Hx     Social History Social History    Substance Use Topics  . Smoking status: Former Smoker    Packs/day: 0.50    Years: 20.00    Types: Cigarettes    Quit date: 08/13/2016  . Smokeless tobacco: Never Used  . Alcohol use Yes     Comment: occasionally     Allergies   Codeine; Amoxicillin; and Doxycycline   Review of Systems Review of Systems  Cardiovascular: Negative for chest pain (resolved).  Gastrointestinal: Negative for abdominal pain, blood in stool, constipation, diarrhea and vomiting.  Genitourinary: Negative for dysuria and hematuria.  Musculoskeletal: Positive for back pain. Negative for gait problem.  Neurological: Negative for weakness and numbness.  All other systems reviewed and are negative.  Physical Exam Updated Vital Signs BP (!) 143/92 (BP Location: Left Arm)   Pulse 90   Temp 98 F (36.7 C) (Oral)   Resp 18   Ht 5\' 6"  (1.676 m)   Wt 90.7 kg   SpO2 100%   BMI 32.28 kg/m   Physical Exam  Constitutional: She is oriented to person, place, and time. She appears well-developed and well-nourished. No distress.  HENT:  Head: Normocephalic and atraumatic.  Eyes: Conjunctivae and EOM are normal. Right eye exhibits no discharge. Left eye exhibits no discharge. No scleral icterus.  Neck: Normal range of motion. Neck supple.  Cardiovascular: Normal rate, regular rhythm, normal heart sounds and intact distal pulses.   Pulmonary/Chest: Effort normal and breath sounds normal. No respiratory distress. She has no wheezes. She has no rales. She exhibits no tenderness.  Abdominal: Soft. Bowel sounds are normal. She exhibits no distension. There is no tenderness.  Musculoskeletal: Normal range of motion. She exhibits tenderness. She exhibits no edema or deformity.  No midline C, T, or L tenderness. Tenderness palpation over left lumbar paraspinal muscles. Negative straight leg raise bilaterally.Full range of motion of neck and back. Full range of motion of bilateral upper and lower extremities, with 5/5  strength. Sensation intact. 2+ radial and PT pulses. Cap refill <2 seconds. Patient able to stand and ambulate without assistance.    Neurological: She is alert and oriented to person, place, and time. She has normal strength. She displays normal reflexes. No sensory deficit. She displays a negative Romberg sign.  Skin: Skin is warm and dry. Capillary refill takes less than 2 seconds. She is not diaphoretic.  Nursing note and vitals reviewed.  ED Treatments / Results  DIAGNOSTIC STUDIES:  Oxygen Saturation is 100% on RA, normal by my interpretation.    COORDINATION OF CARE:  7:42 PM Will order UA. Discussed treatment plan that includes pain medications with pt at bedside and pt agreed to plan.  Labs (all labs ordered are listed, but only abnormal results are displayed) Labs Reviewed  URINALYSIS, ROUTINE W REFLEX MICROSCOPIC -  Abnormal; Notable for the following:       Result Value   APPearance HAZY (*)    Protein, ur 30 (*)    Bacteria, UA FEW (*)    Squamous Epithelial / LPF 6-30 (*)    All other components within normal limits  POC URINE PREG, ED    EKG  EKG Interpretation None       Radiology No results found.  Procedures Procedures (including critical care time)  Medications Ordered in ED Medications  oxyCODONE-acetaminophen (PERCOCET/ROXICET) 5-325 MG per tablet 1 tablet (not administered)  methocarbamol (ROBAXIN) tablet 500 mg (not administered)  ketorolac (TORADOL) 30 MG/ML injection 30 mg (30 mg Intramuscular Given 01/23/17 2002)     Initial Impression / Assessment and Plan / ED Course  I have reviewed the triage vital signs and the nursing notes.  Pertinent labs & imaging results that were available during my care of the patient were reviewed by me and considered in my medical decision making (see chart for details).     Patient with back pain.  No neurological deficits and normal neuro exam. Preg negative. UA unremarkable.  Patient can walk but states  is painful.  No loss of bowel or bladder control.  No concern for cauda equina.  No fever, night sweats, weight loss, h/o cancer, IVDU.  RICE protocol, muscle relaxant and pain medicine indicated and discussed with patient for suspected left lumbar muscle strain.  Discussed return precautions.  Final Clinical Impressions(s) / ED Diagnoses   Final diagnoses:  Acute left-sided low back pain without sciatica    New Prescriptions New Prescriptions   HYDROCODONE-ACETAMINOPHEN (NORCO/VICODIN) 5-325 MG TABLET    Take 1 tablet by mouth every 4 (four) hours as needed.   IBUPROFEN (ADVIL,MOTRIN) 600 MG TABLET    Take 1 tablet (600 mg total) by mouth every 6 (six) hours as needed.   METHOCARBAMOL (ROBAXIN) 500 MG TABLET    Take 1 tablet (500 mg total) by mouth 2 (two) times daily.   I personally performed the services described in this documentation, which was scribed in my presence. The recorded information has been reviewed and is accurate.     Shelbie Franken [place], [place] 01/23/17 2114    Alfonzo Beers, MD 01/23/17 2141

## 2017-01-23 NOTE — ED Notes (Signed)
PT DISCHARGED. INSTRUCTIONS AND PRESCRIPTIONS GIVEN. AAOX4. PT IN NO APPARENT DISTRESS. THE OPPORTUNITY TO ASK QUESTIONS WAS PROVIDED. 

## 2017-01-23 NOTE — Discharge Instructions (Signed)
Take your medications as prescribed. You may also apply ice and/or heat to affected area for 15-20 minutes 3-4 times daily for additional pain relief. I recommend refraining from doing any heavy lifting, squatting or repetitive movements that exacerbate her symptoms for the next few days. °Follow-up with your primary care provider in the next week if her symptoms have not improved. °Please return to the Emergency Department if symptoms worsen or new onset of denies fever, numbness, tingling, groin anesthesia, loss of bowel or bladder, weakness, chest pain, abdominal pain, vomiting. °

## 2017-01-24 ENCOUNTER — Other Ambulatory Visit (HOSPITAL_COMMUNITY): Payer: 59 | Attending: Psychiatry | Admitting: Psychiatry

## 2017-01-24 ENCOUNTER — Encounter (HOSPITAL_COMMUNITY): Payer: Self-pay | Admitting: Psychiatry

## 2017-01-24 DIAGNOSIS — F331 Major depressive disorder, recurrent, moderate: Secondary | ICD-10-CM

## 2017-01-24 DIAGNOSIS — F333 Major depressive disorder, recurrent, severe with psychotic symptoms: Secondary | ICD-10-CM | POA: Insufficient documentation

## 2017-01-24 DIAGNOSIS — F419 Anxiety disorder, unspecified: Secondary | ICD-10-CM | POA: Diagnosis not present
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
## 2017-01-25 ENCOUNTER — Other Ambulatory Visit (HOSPITAL_COMMUNITY): Payer: 59 | Attending: Psychiatry | Admitting: Psychiatry

## 2017-01-25 DIAGNOSIS — Z79899 Other long term (current) drug therapy: Secondary | ICD-10-CM | POA: Diagnosis not present

## 2017-01-25 DIAGNOSIS — F333 Major depressive disorder, recurrent, severe with psychotic symptoms: Secondary | ICD-10-CM | POA: Diagnosis present

## 2017-01-25 DIAGNOSIS — F331 Major depressive disorder, recurrent, moderate: Secondary | ICD-10-CM

## 2017-01-25 DIAGNOSIS — Z87891 Personal history of nicotine dependence: Secondary | ICD-10-CM | POA: Diagnosis not present

## 2017-01-25 DIAGNOSIS — F332 Major depressive disorder, recurrent severe without psychotic features: Secondary | ICD-10-CM | POA: Diagnosis not present
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
## 2017-01-26 ENCOUNTER — Other Ambulatory Visit (HOSPITAL_COMMUNITY): Payer: 59 | Admitting: Psychiatry

## 2017-01-26 DIAGNOSIS — F331 Major depressive disorder, recurrent, moderate: Secondary | ICD-10-CM

## 2017-01-26 DIAGNOSIS — F332 Major depressive disorder, recurrent severe without psychotic features: Secondary | ICD-10-CM | POA: Diagnosis not present

## 2017-01-26 NOTE — Progress Notes (Signed)
    Daily Group Progress Note  Program: IOP  Group Time: 9:00-12:00  Participation Level: Active  Behavioral Response: Appropriate  Type of Therapy:  Group Therapy  Summary of Progress: Pt. Participated in medication management group with the pharmacist. Pt. Participated in discussion about the difficulty of requesting and receiving support from family members. Pt. Discussed that she thinks that she needs help trying to explain depression to her children.      Nancie Neas, LPC

## 2017-01-27 ENCOUNTER — Other Ambulatory Visit (HOSPITAL_COMMUNITY): Payer: 59 | Admitting: Psychiatry

## 2017-01-27 DIAGNOSIS — F331 Major depressive disorder, recurrent, moderate: Secondary | ICD-10-CM

## 2017-01-27 DIAGNOSIS — F332 Major depressive disorder, recurrent severe without psychotic features: Secondary | ICD-10-CM | POA: Diagnosis not present

## 2017-01-27 NOTE — Progress Notes (Signed)
    Daily Group Progress Note  Program: IOP  Group Time: 9:00-12:00  Participation Level: Active  Behavioral Response: Appropriate  Type of Therapy:  Group Therapy  Summary of Progress: Pt.'s first day in group. Pt. Met with the psychiatrist and the case manager. Presents with brightened affect, engaged in the group process. Pt. Discussed poor relationship boundaries in the relationship with her mother. Pt. Participated in discussion of the importance of assertiveness and healthy boundaries in relationships.     Nancie Neas, LPC

## 2017-01-27 NOTE — Progress Notes (Signed)
    Daily Group Progress Note  Program: IOP  Group Time:  9:00-12:00  Participation Level: Active  Behavioral Response: Appropriate  Type of Therapy:  Group Therapy  Summary of Progress: Pt. Presents as talkative, open to feedback from the group. Pt. Shred that she was feeling some anxiety today. Pt. Discussed that she thinks it was triggered by discussion of her relationship and thoughts about her sister during yesterday's group. Pt. Participated in discussion about sleep hygiene and use of the grounding series to help manage anxiety.     Nancie Neas, LPC

## 2017-01-28 ENCOUNTER — Other Ambulatory Visit (HOSPITAL_COMMUNITY): Payer: 59 | Admitting: Psychiatry

## 2017-01-28 DIAGNOSIS — F332 Major depressive disorder, recurrent severe without psychotic features: Secondary | ICD-10-CM | POA: Diagnosis not present

## 2017-01-28 DIAGNOSIS — F331 Major depressive disorder, recurrent, moderate: Secondary | ICD-10-CM

## 2017-01-28 NOTE — Progress Notes (Signed)
    Daily Group Progress Note  Program: IOP  Group Time: 9:00-12:00  Participation Level: Active  Behavioral Response: Appropriate  Type of Therapy:  Group Therapy  Summary of Progress: Pt. Presents as talkative, engaged in the group process, open to feedback from the group. Pt. Discussed her challenge with self-esteem and self-concept. Pt. Discussed recent weight gain and insecurities that are triggered by conversations with her sister. Pt. Received feedback from the group about being more assertive with her sister about getting her needs met.     Nancie Neas, LPC

## 2017-01-31 ENCOUNTER — Other Ambulatory Visit (HOSPITAL_COMMUNITY): Payer: 59 | Admitting: Psychiatry

## 2017-01-31 DIAGNOSIS — F332 Major depressive disorder, recurrent severe without psychotic features: Secondary | ICD-10-CM | POA: Diagnosis not present

## 2017-01-31 DIAGNOSIS — F331 Major depressive disorder, recurrent, moderate: Secondary | ICD-10-CM

## 2017-01-31 NOTE — Progress Notes (Signed)
    Daily Group Progress Note  Program: IOP  Group Time: 9:00-12:00  Participation Level: Active  Behavioral Response: Appropriate  Type of Therapy:  Group Therapy  Summary of Progress: Pt. Presents as talkative, engaged in the group process. Pt. Discussed problem of needs not being met in relationship with her significant other. Pt. Participated in discussion about being assertive in relationships, identifying would would bring joy and satisfaction for themselves before seeking to have their emotional needs met in relationship.      Nancie Neas, LPC

## 2017-02-01 ENCOUNTER — Other Ambulatory Visit (HOSPITAL_COMMUNITY): Payer: 59 | Admitting: Psychiatry

## 2017-02-01 DIAGNOSIS — F331 Major depressive disorder, recurrent, moderate: Secondary | ICD-10-CM

## 2017-02-01 DIAGNOSIS — F332 Major depressive disorder, recurrent severe without psychotic features: Secondary | ICD-10-CM | POA: Diagnosis not present

## 2017-02-01 MED ORDER — BUPROPION HCL ER (XL) 150 MG PO TB24: ORAL_TABLET | ORAL | 2 refills | Status: DC

## 2017-02-01 NOTE — Addendum Note (Signed)
Addended by: Donnelly Angelica D on: 02/01/2017 01:40 PM   Modules accepted: Orders

## 2017-02-01 NOTE — Progress Notes (Signed)
   Patient ID: Lori Jensen, female   DOB: 06-05-1971, 46 y.o.   MRN: [id number]  Anxious about the extra weight she has gained since this current bout of depression made worse by the Cymbalta.  Plan:  Increase the Wellbutrin XL to 450 mg daily.  Might make the anxiety worse and if so will drop back to 300 mg.  Might help with the depression as she does not want to increase the Cymbalta unless we have to because of the weight gain and indirectly decrease the anxiety.  Will talk to her doctor about a weight loss medication added to her current regimen if needed.

## 2017-02-02 ENCOUNTER — Other Ambulatory Visit (HOSPITAL_COMMUNITY): Payer: 59 | Admitting: Psychiatry

## 2017-02-02 DIAGNOSIS — F331 Major depressive disorder, recurrent, moderate: Secondary | ICD-10-CM

## 2017-02-02 DIAGNOSIS — F332 Major depressive disorder, recurrent severe without psychotic features: Secondary | ICD-10-CM | POA: Diagnosis not present

## 2017-02-03 ENCOUNTER — Other Ambulatory Visit (HOSPITAL_COMMUNITY): Payer: 59 | Admitting: Licensed Clinical Social Worker

## 2017-02-03 DIAGNOSIS — F332 Major depressive disorder, recurrent severe without psychotic features: Secondary | ICD-10-CM | POA: Diagnosis not present

## 2017-02-03 DIAGNOSIS — F331 Major depressive disorder, recurrent, moderate: Secondary | ICD-10-CM

## 2017-02-03 NOTE — Progress Notes (Signed)
    Daily Group Progress Note  Program: IOP  Group Time: 9:00-12:00  Participation Level: Active  Behavioral Response: Appropriate  Type of Therapy:  Group Therapy  Summary of Progress: Pt. Presents with brightened affect, talkative, appropriately tearful, receptive of feedback from the group. Pt. Discussed her relationship history and was able to reflect on how she had contributed to her relationships and ability to take responsibility for how she is showing up in her relationships that at times do not get her the outcome that she wants.    Nancie Neas, LPC
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
## 2017-02-03 NOTE — Progress Notes (Signed)
    Daily Group Progress Note  Program: IOP  Group Time: 9:00-12:00  Participation Level: Active  Behavioral Response: Appropriate  Type of Therapy:  Group Therapy  Summary of Progress: Pt. Participated in medication management group with the pharmacist. Pt. Presents with brightened affect, appropriately tearful. Pt. Discussed ongoing challenge of people pleasing and difficulty setting healthy boundaries with family and significant other.       Nancie Neas, LPC

## 2017-02-04 ENCOUNTER — Other Ambulatory Visit (HOSPITAL_COMMUNITY): Payer: 59 | Admitting: Psychiatry

## 2017-02-04 DIAGNOSIS — F332 Major depressive disorder, recurrent severe without psychotic features: Secondary | ICD-10-CM | POA: Diagnosis not present

## 2017-02-04 DIAGNOSIS — F331 Major depressive disorder, recurrent, moderate: Secondary | ICD-10-CM

## 2017-02-04 NOTE — Progress Notes (Signed)
    Daily Group Progress Note  Program: IOP  Group Time: 9:00-12:00  Participation Level: Active  Behavioral Response: Appropriate  Type of Therapy:  Group Therapy  Summary of Progress: Pt. Presents with euthymic affect and mood. Pt engaged in group discussion and identified ongoing work with assertiveness.    [name], LCSW

## 2017-02-07 ENCOUNTER — Other Ambulatory Visit (HOSPITAL_COMMUNITY): Payer: 59 | Admitting: Psychiatry

## 2017-02-07 DIAGNOSIS — F332 Major depressive disorder, recurrent severe without psychotic features: Secondary | ICD-10-CM | POA: Diagnosis not present

## 2017-02-07 DIAGNOSIS — F331 Major depressive disorder, recurrent, moderate: Secondary | ICD-10-CM
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
## 2017-02-07 NOTE — Progress Notes (Signed)
    Daily Group Progress Note  Program: IOP  Group Time: 9:00-12:00  Participation Level: Active  Behavioral Response: Appropriate  Type of Therapy:  Group Therapy  Summary of Progress: Pt. Participated in medication management group with the pharmacist. Pt. Presents with  Calm mood, talkative, engaged in the group process. Pt. Shared that she had a good weekend and celebrated mother's day with her children. Pt. Expressed concern about returning to work and how to be consistent with her routine when group is over.    Nancie Neas, LPC

## 2017-02-08 ENCOUNTER — Other Ambulatory Visit (HOSPITAL_COMMUNITY): Payer: 59 | Admitting: Licensed Clinical Social Worker

## 2017-02-08 DIAGNOSIS — F332 Major depressive disorder, recurrent severe without psychotic features: Secondary | ICD-10-CM | POA: Diagnosis not present

## 2017-02-08 DIAGNOSIS — F331 Major depressive disorder, recurrent, moderate: Secondary | ICD-10-CM

## 2017-02-09 ENCOUNTER — Other Ambulatory Visit (HOSPITAL_COMMUNITY): Payer: 59 | Admitting: Psychiatry

## 2017-02-09 DIAGNOSIS — F332 Major depressive disorder, recurrent severe without psychotic features: Secondary | ICD-10-CM | POA: Diagnosis not present

## 2017-02-09 DIAGNOSIS — F331 Major depressive disorder, recurrent, moderate: Secondary | ICD-10-CM

## 2017-02-09 NOTE — Progress Notes (Signed)
    Daily Group Progress Note  Program: IOP  Group Time: 9:00-12:00  Participation Level: Active  Behavioral Response: Appropriate  Type of Therapy:  Group Therapy  Summary of Progress: Pt. Presents with euthymic affect and mood. Pt engaged in group discussion and shared issues with setting boundaries. Pt participated in chaplaincy group. Pt denies SI/HI   [name], LCSW

## 2017-02-10 ENCOUNTER — Other Ambulatory Visit (HOSPITAL_COMMUNITY): Payer: 59

## 2017-02-10 NOTE — Progress Notes (Signed)
    Daily Group Progress Note  Program: IOP  Group Time: 9:00-12:00  Participation Level: Active  Behavioral Response: Appropriate  Type of Therapy:  Group Therapy  Summary of Progress: Pt. Presented as talkative, appropriately tearful. Pt. Discussed disappointment with herself for not being able to set relationship boundaries, but shared with the group that she is in a relationship that she knows is not good for her. Pt. Participated in presentation and discussion Pt. Participated about nutrition, moderate exercise, and sleep hygience facilitated by Yvonna Alanis of the wellness department.     Nancie Neas, LPC

## 2017-02-11 ENCOUNTER — Other Ambulatory Visit (HOSPITAL_COMMUNITY): Payer: 59 | Admitting: Licensed Clinical Social Worker

## 2017-02-11 DIAGNOSIS — F332 Major depressive disorder, recurrent severe without psychotic features: Secondary | ICD-10-CM | POA: Diagnosis not present

## 2017-02-11 DIAGNOSIS — F331 Major depressive disorder, recurrent, moderate: Secondary | ICD-10-CM

## 2017-02-14 ENCOUNTER — Other Ambulatory Visit (HOSPITAL_COMMUNITY): Payer: 59

## 2017-02-14 NOTE — Progress Notes (Signed)
    Daily Group Progress Note  Program: IOP  Group Time: 9:00-12:00  Participation Level: Active  Behavioral Response: Appropriate  Type of Therapy:  Group Therapy  Summary of Progress: Pt. Presents with deprerssed affect and mood. Pt engaged in group discussion and shared issues with self image and self-love. Pt denies SI/HI   [name], LCSW

## 2017-02-15 ENCOUNTER — Other Ambulatory Visit (HOSPITAL_COMMUNITY): Payer: 59

## 2017-02-16 ENCOUNTER — Other Ambulatory Visit (HOSPITAL_COMMUNITY): Payer: 59 | Admitting: Psychiatry

## 2017-02-16 DIAGNOSIS — F332 Major depressive disorder, recurrent severe without psychotic features: Secondary | ICD-10-CM | POA: Diagnosis not present

## 2017-02-16 DIAGNOSIS — F331 Major depressive disorder, recurrent, moderate: Secondary | ICD-10-CM
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
## 2017-02-16 NOTE — Progress Notes (Addendum)
Patient ID: Lori Jensen, female   DOB: [birth year], 46 y.o.   MRN: [id number] [facility] IOP DISCHARGE NOTE  Patient:  Lori Jensen DOB:  07-Jan-1971  Date of Admission: 01/24/2017  Date of Discharge: 02/16/2017  Reason for Admission:depression  IOP Course:attended and participated.  From the perspective of the group leader progress was good with the expectation of returning to work in June.  [name] reported she felt more depressed and too anxious to even consider returning to work.  Her depression scores decreased to a severe level rather than improving.  She remains stuck I making a decision to stay or leave her boyfriend though her head tells her clearly she should leave.  The depression makes it harder to make any rational decision  Mental Status at [place] has suicidal thoughts but no intent.  Her children keep her alive she says  Diagnosis: severe major depression, recurrent without psychosis  Level of Care:  IOP  Discharge destination:  Referred to partial hospital program    Comments:  The worsening depression and feeling of hopelessness led to the decision to refer to a higher level of care.  She already  Takes 450 mg buproprion XL and only 30 mg duloxetine so there is some room to add more duloxetine  The patient received suicide prevention pamphlet:  Yes   Donnelly Angelica, MD

## 2017-02-17 ENCOUNTER — Other Ambulatory Visit (HOSPITAL_COMMUNITY): Payer: 59

## 2017-02-17 ENCOUNTER — Other Ambulatory Visit (HOSPITAL_COMMUNITY): Payer: 59 | Admitting: Specialist

## 2017-02-17 ENCOUNTER — Ambulatory Visit (INDEPENDENT_AMBULATORY_CARE_PROVIDER_SITE_OTHER): Payer: 59 | Admitting: Licensed Clinical Social Worker

## 2017-02-17 DIAGNOSIS — F331 Major depressive disorder, recurrent, moderate: Secondary | ICD-10-CM | POA: Diagnosis not present

## 2017-02-17 NOTE — Progress Notes (Signed)
    Daily Group Progress Note  Program: IOP  Group Time: 9:00-12:00  Participation Level: Active  Behavioral Response: Appropriate  Type of Therapy:  Group Therapy  Summary of Progress: Pt. Presented as anxious and depressed. Pt. Stated that she was very fearful of returning to work and her depression worsening. Pt. Reported that she felt discouraged about her future. Pt. Prepared for discharge and met with the case manager and the psychiatrist. Pt. Discussed plans to start the partial program due to her worsening depression and anxiety. Pt. Received positive feedback from the group regarding her participation in IOP.      Nancie Neas, LPC

## 2017-02-17 NOTE — Progress Notes (Signed)
   THERAPIST PROGRESS NOTE  Session Time: 10:20-10:50am  Participation Level: Active  Behavioral Response: CasualAlertDepressed  Type of Therapy: Individual Therapy  Treatment Goals addressed: Coping  Interventions: Supportive  Summary: Lori Jensen is a 46 y.o. female who presents with depression for her initial individual session. Pt completed MH_IOP yesterday and begins PHP tomorrow. She was referred to individual therapy to establish care. Spent a considerable amount of time establishing a trusting therapeutic relationship. Pt discussed her psychiatric symptoms and current life events. Pt discussed her life stressors and coping skills. Pt scheduled an individual counseling appointment upon discharge from [place].    Homicidal: Nowithout intent/plan  Therapist Response: Assessed pt's current functioning for baseline. Assisted pt processing for the management of stressors.      Plan: Return upon completion of PHP  Diagnosis: Axis I: Moderated episode of recurrent major depressive disorder        Adream Parzych S, LCAS 02/17/2017

## 2017-02-18 ENCOUNTER — Encounter (HOSPITAL_COMMUNITY): Payer: Self-pay

## 2017-02-18 ENCOUNTER — Telehealth (HOSPITAL_COMMUNITY): Payer: Self-pay

## 2017-02-18 ENCOUNTER — Other Ambulatory Visit (HOSPITAL_COMMUNITY): Payer: 59

## 2017-02-18 ENCOUNTER — Other Ambulatory Visit (HOSPITAL_COMMUNITY): Payer: 59 | Admitting: Licensed Clinical Social Worker

## 2017-02-18 VITALS — BP 118/76 | HR 84 | Ht 66.0 in | Wt 205.0 lb

## 2017-02-18 DIAGNOSIS — F331 Major depressive disorder, recurrent, moderate: Secondary | ICD-10-CM

## 2017-02-18 DIAGNOSIS — F332 Major depressive disorder, recurrent severe without psychotic features: Secondary | ICD-10-CM | POA: Diagnosis not present

## 2017-02-18 MED ORDER — BUPROPION HCL ER (XL) 150 MG PO TB24: ORAL_TABLET | ORAL | 0 refills | Status: DC

## 2017-02-18 NOTE — Telephone Encounter (Signed)
Medication management - Contacted Dr. Daron Offer, helping to cover PHP in the absence of Dr. Lovena Le today to obtain a 30 day order for patient's increased Wellbutrin XL to 150 mg, three a day as this order was not sent in previously as patient had thought after meeting with Dr. Lovena Le.  Dr. Daron Offer provided a 30 day order and e-scribed to patient's [place] with no refills per verbal order. Patient to contact us back if any problems getting order filled.

## 2017-02-18 NOTE — Psych (Signed)
   [facility] BH PHP THERAPIST PROGRESS NOTE  Lori Jensen [id number]  Session Time: 9 -1   Participation Level: Active  Behavioral Response: CasualAlertDepressed  Type of Therapy: Group Therapy  Treatment Goals addressed: Coping  Interventions: CBT, DBT, Supportive and Reframing  Summary:  9:00 - 10:30 Clinician led check-in regarding current stressors and situation, and review of patient completed daily inventory. Clinician utilized active listening and empathetic response and validated patient emotions. Clinician facilitated processing group on pertinent issues.  10:30 -11:00:  Pt led psychoeducation group on the tenets of healthy and unhealthy relationships. 11:00 - 12:00 Pt led activity on the relationships in our lives. 12:00 - 12:50 Clinician led psychotherapy group on what patients want out of a relationship and how past hurts play into our perception of relationships. 12:50 - 1:00 Clinician led check-out. Clinician assessed for immediate needs, medication compliance and efficacy, and safety concerns.   Suicidal/Homicidal: Nowithout intent/plan  Therapist Response: Lori Jensen is a 46 y.o. female who presents with depression and anxiety symptoms. Patient arrived within time allowed and reports she is feeling "overwhelmed." Patient rates her mood at a 5 on a 1- 10 scale with 10 being great. Patient engaged in activity and discussion. Patient identified some ways a current relationship is unhealthy for her. Patient demonstrates some progress as evidenced by open communication on her first day. Patient denies SI/HI/self-harm thoughts     Plan: Patient will continue in PHP and medication management. Work towards decreasing depression and anxiety symptoms while increasing ability to self manage and handle responsibilities   Diagnosis: Moderate episode of recurrent major depressive disorder ([place]) [F33.1]    1. Moderate episode of recurrent major depressive disorder ([place])        [name], LCSW 02/18/2017

## 2017-02-18 NOTE — Progress Notes (Signed)
Patient presented with appropriate affect depressed mood and admitted she often has thoughts of suicide but denies any plan or intent.  Denies any homicidal ideations and no auditory, visual or tactile hallucinations. Reports recently being so depressed she felt like someone was actually getting in bed with her but denies any similar symptoms now.  Patient reporting feeling PHP more helpful than IOP she was attending and reports desire to live due to children.  Discussed patient's medications and management of anxiety.  Patient reported current level of depression an 8, anxiety an 8 and hopelessness a 7 on a scale of 0-10 with 0 being none and 10 the worst she could experience.  Arranged to contact provider to obtain a new Wellbutrin XL order as the once form 02/01/17 was not called in and was only for 10 days.  Patient reports sleep from 2-5 hours a night even though often feels extremely tired.  Patient reports weight gain significant, around 50 pounds in past year due to overeats when depressed.  Patient at present denies any danger to self of others.  Informed increased Wellbutrin order would be sent as patient recently increased.  Patient agreed to work on taking Buspar more than once a day due to ongoing problems with feeling anxious but admits she just forgets.  Patient to contact his nurse or PHP staff if any symptoms worsen and will pick up Wellbutrin order this date as only has 2 pills for the weekend.

## 2017-02-21 ENCOUNTER — Other Ambulatory Visit (HOSPITAL_COMMUNITY): Payer: Self-pay

## 2017-02-22 ENCOUNTER — Other Ambulatory Visit (HOSPITAL_COMMUNITY): Payer: 59 | Admitting: Occupational Therapy

## 2017-02-22 ENCOUNTER — Other Ambulatory Visit (HOSPITAL_COMMUNITY): Payer: 59 | Admitting: Licensed Clinical Social Worker

## 2017-02-22 ENCOUNTER — Encounter (HOSPITAL_COMMUNITY): Payer: Self-pay | Admitting: Licensed Clinical Social Worker

## 2017-02-22 ENCOUNTER — Other Ambulatory Visit (HOSPITAL_COMMUNITY): Payer: 59

## 2017-02-22 DIAGNOSIS — F331 Major depressive disorder, recurrent, moderate: Secondary | ICD-10-CM

## 2017-02-22 DIAGNOSIS — F332 Major depressive disorder, recurrent severe without psychotic features: Secondary | ICD-10-CM | POA: Diagnosis not present

## 2017-02-22 NOTE — Psych (Signed)
   [facility] BH PHP THERAPIST PROGRESS NOTE  RUMAYSA SABATINO [id number]  Session Time: 9 -2   Participation Level: Active  Behavioral Response: CasualAlertDepressed  Type of Therapy: Group Therapy  Treatment Goals addressed: Coping  Interventions: CBT, DBT, Supportive and Reframing  Summary:  9:00 - 10:30 Clinician led check-in regarding current stressors and situation, and review of patient completed daily inventory. Clinician utilized active listening and empathetic response and validated patient emotions. Clinician facilitated processing group on pertinent issues.  10:30 -11:30: OT Group 11:30 - 12:45 Clinician led psychotherapy group on how societal pressure and stigma impacts mental health.  12:45 - 1:50 Clinician introduced topic of "Boundaries". Patients discussed the difference between rigid, porous, and healthy boundaries. Patients identified different areas in life where boundaries need to be addressed. 1:50 - 2:00 Clinician led check-out. Clinician assessed for immediate needs, medication compliance and efficacy, and safety concerns.   Suicidal/Homicidal: Nowithout intent/plan  Therapist Response: SHOUA ULLOA is a 46 y.o. female who presents with depression and anxiety symptoms. Patient arrived within time allowed and reports she is feeling "anxious." Patient rates her mood at a 5 on a 1- 10 scale with 10 being great. Patient engaged in activity and discussion. Patient shared continued problems with her mother and was able to apply boundaries topic to this relationship. Patient demonstrates some progress as evidenced by willingness to apply topic to her situation. Patient denies SI/HI/self-harm thoughts     Plan: Patient will continue in PHP and medication management. Work towards decreasing depression and anxiety symptoms while increasing ability to self manage and handle responsibilities   Diagnosis: Moderate episode of recurrent major depressive disorder ([place])  [F33.1]    1. Moderate episode of recurrent major depressive disorder ([place])       [name], LCSW 02/22/2017

## 2017-02-22 NOTE — Therapy (Signed)
[place], [place], [address] Phone: [phone]   Fax:  [phone]  Occupational Therapy Evaluation  Patient Details  Name: Lori Jensen MRN: [id number] Date of Birth: [birth year] Referring Provider: Dr. Neita Garnet  Encounter Date: 02/22/2017      OT End of Session - 02/22/17 1250    Visit Number 1   Number of Visits 6   Date for OT Re-Evaluation 03/11/17   Authorization Type [employer]   OT Start Time 1030   OT Stop Time 1135   OT Time Calculation (min) 65 min   Activity Tolerance Patient tolerated treatment well   Behavior During Therapy [facility] for tasks assessed/performed      Past Medical History:  Diagnosis Date  . Anxiety   . Depression   . Dysmenorrhea   . Heart murmur   . Hypertension   . PONV (postoperative nausea and vomiting)     Past Surgical History:  Procedure Laterality Date  . DILATION AND CURETTAGE OF UTERUS    . DILATION AND CURETTAGE OF UTERUS  01/16/2013   Procedure: DILATATION AND CURETTAGE;  Surgeon: Lori Height, MD;  Location: [facility];  Service: Gynecology;;  . ENDOMETRIAL ABLATION    . uterine ablation  08/13/2011    There were no vitals filed for this visit.      Subjective Assessment - 02/22/17 1249    Currently in Pain? No/denies           [facility] OT Assessment - 02/22/17 1249      Assessment   Diagnosis Major Depressive Disorder   Referring Provider Dr. Felicita Gage [place]   Onset Date --  chronic     Precautions   Precautions None       General Causality Orientation Scale    Subscore Percentile Score  Autonomy 48 33.43  Control 39 15.30  Impersonal 50 58.92    Motivation Type  Motivation type Explanation    Impersonal/amotivational  There is a lack of connection between any of the individual's behavior and his or her personal goals. The individual is likely in a passivity state or manifests non-goal-directed behavior. This is considered a  type of motivational deficit and warrants motivational interventions.        Assessment:  Patient demonstrates impersonal/amotivational behaviors. This is considered a type of motivational deficit and warrants motivational interventions. Patient will benefit from occupational therapy intervention in order to improve time management, financial management, stress management, job readiness skills, social skills, sleep hygiene, exercise and healthy eating habits, and health management skills and other psychosocial skills needed for preparation to return to full time community living and to be a productive community member.     Plan:  Patient will participate in skilled occupational therapy sessions individually or in a group setting to improve coping skills, psychosocial skills, and emotional skills required to return to prior level of function as a productive community member. Treatment will be 1-2 times per week for 2-6 weeks.        OT Treatment Session: Exercise   S:  "I'm not exercising right now." O:  Patient actively participated in the following skilled occupational therapy group this date:  Exercise: Discussed strategies to begin an exercises program, contraindications to exercise, and tips for incorporating exercise into daily routine. Pt participated in leading group in stretching and exercise activity, as well as brainstorming how to adapt exercises when necessary. Pt participated in exercise "scattegories" game focusing on exercise, stress  reduction, and health benefits of exercise. Pt participated in group discussions and activities throughout session.  A:  Patient participated in skilled occupational therapy group for exercise skills this date.  Patient was engaged and appears open to strategies introduced.  Patient is not actively exercising at this time, does acknowledge health benefits of exercising.  Pt provided with tips for incorporating exercises into routine, at home workouts,  benefits of exercise, and contraindications for exercise handouts. Pt provided with chart to begin documenting daily exercise-intentional exercise or non-intentional.    P:  Continue participation in skilled occupational therapy groups 1-2 times per week for 2 weeks in order to gain the necessary skills needed to return to full time community living and learn effective coping strategies to be a productive community resident.          OT Short Term Goals - 02/22/17 1251      OT SHORT TERM GOAL #1   Title Patient will be educated on strategies to improve psychosocial skills needed to participate fully in all daily, work, and leisure activities.   Time 3   Period Weeks   Status New     OT SHORT TERM GOAL #2   Title Patient will be educated on a HEP and independent with implementation of HEP.   Time 3   Period Weeks   Status New     OT SHORT TERM GOAL #3   Title Patient will independently apply psychosocial skills and coping mechanisms to her daily activities in order to function independently.   Time 3   Period Weeks                  Plan - 02/22/17 1250    Rehab Potential Good   OT Frequency 2x / week   OT Duration --  3 weeks   OT Treatment/Interventions Self-care/ADL training;Patient/family education  decreased coping skills, decreased psychosocial skills   Consulted and Agree with Plan of Care Patient      Patient will benefit from skilled therapeutic intervention in order to improve the following deficits and impairments:   (decreased coping skills, decreased psychosocial skils )  Visit Diagnosis: Moderate episode of recurrent major depressive disorder [facility])    Problem List Patient Active Problem List   Diagnosis Date Noted  . SMOKER 07/23/2009  . PALPITATIONS 06/12/2009  . PANIC ATTACKS 11/24/2006  . DEPRESSIVE DISORDER, NOS 11/24/2006  . MITRAL VALVE PROLAPSE 11/24/2006  . CHEST PAIN 11/24/2006   Lori Jensen, OTR/L  [phone] 02/22/2017,  12:52 PM  [facility] PARTIAL HOSPITALIZATION PROGRAM [place], [place], [address] Phone: [phone]   Fax:  [phone]  Name: Lori Jensen MRN: [id number] Date of Birth: [birth year]

## 2017-02-23 ENCOUNTER — Other Ambulatory Visit (HOSPITAL_COMMUNITY): Payer: 59

## 2017-02-23 ENCOUNTER — Other Ambulatory Visit (HOSPITAL_COMMUNITY): Payer: 59 | Admitting: Licensed Clinical Social Worker

## 2017-02-23 VITALS — BP 118/76 | HR 83 | Ht 66.5 in | Wt 205.0 lb

## 2017-02-23 DIAGNOSIS — F331 Major depressive disorder, recurrent, moderate: Secondary | ICD-10-CM

## 2017-02-23 DIAGNOSIS — F332 Major depressive disorder, recurrent severe without psychotic features: Secondary | ICD-10-CM | POA: Diagnosis not present

## 2017-02-23 NOTE — Progress Notes (Signed)
Met with patient as she presented with flat affect, depressed mood and crying some today stating she was having a harder time currently with feeling more anxious and not prepared to return to work.  States she is currently having a hard time doing anything productive and admitted feels she is letting her kids down too even with simple things such as not cooking and washing clothes when they need them at certain times.  Patient reported her depression at a 6, anxiety at an 8 and hopelessness at a 9 on a scale of 0-10 with 0 being none and 10 the worst she could experience today.   Patient denies any current suicidal or homicidal ideations but admits the previous weekend was 'tough" and some + SI thoughts at that time.  Patient scored a 25 on her weekly PHQ9 depression screening today as evidence of continued significant depression in symptoms but does admit Partial Hospitalization Program has been very helpful. States today has been "emotional" as admits to discussing some things in group that was tough and thought provoking but necessary and reported the group in good in managing these emotions.  Patient reviewed current medications with this nurse and reminded her current order calls for Buspar 10 mg three times as day as is only taking twice a day.  Patient reported plan to increase to three times a day and will see Dr. Lovena Le to discuss medication further.  Patient stable at this time and admits to being invested in [facility] as feels it is helpful and making progress.

## 2017-02-23 NOTE — Psych (Signed)
   [facility] BH PHP THERAPIST PROGRESS NOTE  AVACYN KLOOSTERMAN [id number]  Session Time: 9 -2   Participation Level: Active  Behavioral Response: CasualAlertDepressed  Type of Therapy: Group Therapy  Treatment Goals addressed: Coping  Interventions: CBT, DBT, Supportive and Reframing  Summary:  9:00 - 10:30 Clinician led check-in regarding current stressors and situation, and review of patient completed daily inventory. Clinician utilized active listening and empathetic response and validated patient emotions. Clinician facilitated processing group on pertinent issues.  ?10:30 -12:00 Spiritual care group ?12:00 - 12:45 Clinician led psychotherapy group on the difference between logical mind and emotional mind. Group discussed how emotions do not equal fact.  ?12:45 - 1:50 Relaxation group: Cln Jan Fireman led yoga group focused on retraining the body's response to stress.  ?1:50 - 2:00 Clinician led check-out. Clinician assessed for immediate needs, medication compliance and efficacy, and safety concerns.   Suicidal/Homicidal: Nowithout intent/plan  Therapist Response: KASSY MCENROE is a 46 y.o. female who presents with depression and anxiety symptoms. Patient arrived within time allowed and reports she is feeling "depressed and anxious." Patient rates her mood at a 3 on a 1- 10 scale with 10 being great. Patient engaged in activity and discussion. Patient shared continued problems with her boyfriend and discussed options on how to manage the stress related to the relationship. Patient demonstrates some progress as evidenced by willingness to logically think about how to handle pressing problems in her life. Patient denies SI/HI/self-harm thoughts     Plan: Patient will continue in PHP and medication management. Work towards decreasing depression and anxiety symptoms while increasing ability to self manage and handle responsibilities   Diagnosis: Moderate episode of recurrent major  depressive disorder ([place]) [F33.1]    1. Moderate episode of recurrent major depressive disorder ([place])     Marifer Hurd J Raford Brissett, LPCA 02/23/2017

## 2017-02-23 NOTE — Psych (Signed)
Progress note:   Lori Jensen had an emotional experience in group in that she was more open about the relationship with her boyfriend.  She still cannot remove herself from the relationship and feels foolish.  She wants to please everybody else and does not know how to please herself first.  She recognizes it as a problem so there is hope she can change it.  The anxiety remains a major issue she says Plan:  Take the buspirone 10 mg tid rather than bid.  Consider lorazepam.  The duloxetine can be increased and the buproprion may be causing more anxiety than she believes it does.

## 2017-02-24 ENCOUNTER — Other Ambulatory Visit (HOSPITAL_COMMUNITY): Payer: 59 | Admitting: Specialist

## 2017-02-24 ENCOUNTER — Other Ambulatory Visit (HOSPITAL_COMMUNITY): Payer: 59 | Admitting: Licensed Clinical Social Worker

## 2017-02-24 ENCOUNTER — Other Ambulatory Visit (HOSPITAL_COMMUNITY): Payer: 59

## 2017-02-24 DIAGNOSIS — F332 Major depressive disorder, recurrent severe without psychotic features: Secondary | ICD-10-CM | POA: Diagnosis not present

## 2017-02-24 DIAGNOSIS — F331 Major depressive disorder, recurrent, moderate: Secondary | ICD-10-CM
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
## 2017-02-24 NOTE — Therapy (Signed)
[place], [place], [address] Phone: [phone]   Fax:  [phone]  Occupational Therapy Treatment  Patient Details  Name: HANEEFAH VENTURINI MRN: [id number] Date of Birth: 1971/08/27 Referring Provider: Dr. Neita Garnet  Encounter Date: 02/24/2017      OT End of Session - 02/24/17 2213    Visit Number 2   Number of Visits 6   Date for OT Re-Evaluation 03/11/17   Authorization Type [employer]   OT Start Time 1030   OT Stop Time 1135   OT Time Calculation (min) 65 min   Activity Tolerance Patient tolerated treatment well      Past Medical History:  Diagnosis Date  . Anxiety   . Depression   . Dysmenorrhea   . Heart murmur   . Hypertension   . PONV (postoperative nausea and vomiting)     Past Surgical History:  Procedure Laterality Date  . DILATION AND CURETTAGE OF UTERUS    . DILATION AND CURETTAGE OF UTERUS  01/16/2013   Procedure: DILATATION AND CURETTAGE;  Surgeon: Gus Height, MD;  Location: [facility];  Service: Gynecology;;  . ENDOMETRIAL ABLATION    . uterine ablation  08/13/2011    There were no vitals filed for this visit.      Subjective Assessment - 02/24/17 2213    Currently in Pain? No/denies      S:  Relationships and work stress me.  I think I am going to start baking to cope with stress.  O:  Patient participated in stress management group this date.  Group explored causes of stress, definition of stress, affects stress has on body physically, mentally, behaviorally. Group discussed good stress vs bad stress and why we may have more stress in various times of our life.  Patient created an avoidance hierarchy of places/times that cause stress in their life, labeling from lowest to highest stress.  Each instance was discussed and labeled external or internal stressors.  Group discussed why we internalize occurances and cause more stress to ourselves.  Group then discussed how  to cope with stress using all 5 body senses.  After learning new coping strategies, patient identified 4 key coping strategies they would begin utilizing and wrote them on paper keys that they could carry with them on key rings. A:  Patient participated in group.  Patient able to voice several causes of stress and realize that many were self inflicted.. P:  Follow up on integration of new coping strategies.   Financial management group.                        OT Education - 02/24/17 2213    Education provided Yes   Education Details coping with stress using 5 senses   Person(s) Educated Patient   Methods Explanation;Handout   Comprehension Verbalized understanding          OT Short Term Goals - 02/24/17 2214      OT SHORT TERM GOAL #1   Title Patient will be educated on strategies to improve psychosocial skills needed to participate fully in all daily, work, and leisure activities.   Time 3   Period Weeks   Status On-going     OT SHORT TERM GOAL #2   Title Patient will be educated on a HEP and independent with implementation of HEP.   Time 3   Period Weeks   Status On-going  OT SHORT TERM GOAL #3   Title Patient will independently apply psychosocial skills and coping mechanisms to her daily activities in order to function independently.   Time 3   Period Weeks   Status On-going                Patient will benefit from skilled therapeutic intervention in order to improve the following deficits and impairments:   (decreased coping skills, decreased psychosocial skills)  Visit Diagnosis: Moderate episode of recurrent major depressive disorder [facility])    Problem List Patient Active Problem List   Diagnosis Date Noted  . SMOKER 07/23/2009  . PALPITATIONS 06/12/2009  . PANIC ATTACKS 11/24/2006  . DEPRESSIVE DISORDER, NOS 11/24/2006  . MITRAL VALVE PROLAPSE 11/24/2006  . CHEST PAIN 11/24/2006    Vangie Bicker, [place],  OTR/L [phone]  02/24/2017, 10:15 PM  [facility] PARTIAL HOSPITALIZATION PROGRAM [place], [place], [address] Phone: [phone]   Fax:  [phone]  Name: MALLEY HAUTER MRN: [id number] Date of Birth: 04-11-1971

## 2017-02-25 ENCOUNTER — Other Ambulatory Visit (HOSPITAL_COMMUNITY): Payer: 59 | Attending: Psychiatry | Admitting: Licensed Clinical Social Worker

## 2017-02-25 ENCOUNTER — Other Ambulatory Visit (HOSPITAL_COMMUNITY): Payer: 59

## 2017-02-25 DIAGNOSIS — F333 Major depressive disorder, recurrent, severe with psychotic symptoms: Secondary | ICD-10-CM | POA: Diagnosis not present

## 2017-02-25 DIAGNOSIS — F331 Major depressive disorder, recurrent, moderate: Secondary | ICD-10-CM

## 2017-02-25 NOTE — Psych (Signed)
   [facility] BH PHP THERAPIST PROGRESS NOTE  Lori Jensen [id number]  Session Time: 9 -2   Participation Level: Active  Behavioral Response: CasualAlertDepressed  Type of Therapy: Group Therapy  Treatment Goals addressed: Coping  Interventions: CBT, DBT, Supportive and Reframing  Summary:  9:00 - 10:30 Clinician led check-in regarding current stressors and situation, and review of patient completed daily inventory. Clinician utilized active listening and empathetic response and validated patient emotions. Clinician facilitated processing group on pertinent issues.  10:30 -11:30: OT Group 11:30 - 12:45 Clinician led psychotherapy group on interpersonal issues. 12:45 - 1:50 Clinician introduced topic of "Communication". Clinician explained the difference between communication styles and healthy/unhealthy communication. Patients identified their main form of communication and reasons why it works, or reasons to think about changing it. 1:50 - 2:00 Clinician led check-out. Clinician assessed for immediate needs, medication compliance and efficacy, and safety concerns.    Suicidal/Homicidal: Nowithout intent/plan  Therapist Response: Lori Jensen is a 46 y.o. female who presents with depression and anxiety symptoms. Patient arrived within time allowed and reports she is feeling "exhausted." Patient rates her mood at a 5 on a 1- 10 scale with 10 being great. Patient engaged in activity and discussion. Patient shared continued relationship problems with mother and feelings of guilt. Patient demonstrates some progress as evidenced by continued openness about issues and applying communication topic to her situation. Patient denies SI/HI/self-harm thoughts     Plan: Patient will continue in PHP and medication management. Work towards decreasing depression and anxiety symptoms while increasing ability to self manage and handle responsibilities   Diagnosis: Moderate episode of recurrent major  depressive disorder ([place]) [F33.1]    1. Moderate episode of recurrent major depressive disorder ([place])     [name], LCSW 02/25/2017

## 2017-02-25 NOTE — Psych (Signed)
   [facility] BH PHP THERAPIST PROGRESS NOTE  JESSYE IMHOFF [id number]  Session Time: 9 -1   Participation Level: Active  Behavioral Response: CasualAlertDepressed  Type of Therapy: Group Therapy  Treatment Goals addressed: Coping  Interventions: CBT, DBT, Supportive and Reframing  Summary:  9:00 - 10:00 Clinician led check-in regarding current stressors and situation, and review of patient completed daily inventory. Clinician utilized active listening and empathetic response and validated patient emotions. Clinician facilitated processing group on pertinent issues.  10:00 -11:00: Clinician led psychotherapy group on stigma related to mental illness, and how to cope with thoughts of suicide. 11:00 - 12:00: Clinician introduced topic of "Communication - I Statements". Group watched discussed "I-Statements" and how using these instead of "you-statements" could be beneficial in their lives. 12:00 - 12:50: Clinician introduced topic of "fair fighting rules" for arguments. Patients identified which rules they already follow, and which rules they need to be more aware of for better communication in the future. 12:50 - 1:00 Clinician led check-out. Clinician assessed for immediate needs, medication compliance and efficacy, and safety concerns.  Suicidal/Homicidal: Yeswithout intent/plan  Therapist Response: [name] is a 46 y.o. female who presents with depression and anxiety symptoms. Patient arrived within time allowed and reports she is feeling "hopeless and overwhelmed." Patient rates her mood at a 4 on a 1- 10 scale with 10 being great. Patient engaged in activity and discussion. Patient shared continued thoughts about suicide and how the thought of her children keeps her from acting on the thoughts. Patient demonstrates some progress as evidenced by continued openness to using new methods to deal with thoughts and willingness to share with group. Patient denies SI/HI/self-harm thoughts     Plan: Patient will continue in PHP and medication management. Work towards decreasing depression and anxiety symptoms while increasing ability to self manage and handle responsibilities   Diagnosis: Moderate episode of recurrent major depressive disorder ([place]) [F33.1]    1. Moderate episode of recurrent major depressive disorder ([place])     Jeneen Doutt J. Bricen Victory, LPCA 02/25/2017

## 2017-02-28 ENCOUNTER — Other Ambulatory Visit (HOSPITAL_COMMUNITY): Payer: 59 | Admitting: Licensed Clinical Social Worker

## 2017-02-28 ENCOUNTER — Other Ambulatory Visit (HOSPITAL_COMMUNITY): Payer: 59

## 2017-02-28 DIAGNOSIS — F332 Major depressive disorder, recurrent severe without psychotic features: Secondary | ICD-10-CM

## 2017-02-28 DIAGNOSIS — F333 Major depressive disorder, recurrent, severe with psychotic symptoms: Secondary | ICD-10-CM | POA: Diagnosis not present

## 2017-02-28 NOTE — Psych (Signed)
   [facility] BH PHP THERAPIST PROGRESS NOTE  Lori Jensen [id number]  Session Time: 9 -2   Participation Level: Active  Behavioral Response: CasualAlertDepressed  Type of Therapy: Group Therapy  Treatment Goals addressed: Coping  Interventions: CBT, DBT, Supportive and Reframing  Summary:  9:00 - 10:30: Pharmacist discussed medication with group and answered questions.  10:30 -11:30: Clinician led check-in regarding current stressors and situation, and review of patient completed daily inventory. Clinician utilized active listening and empathetic response and validated patient emotions. Clinician facilitated processing group on pertinent issues.  11:30 - 12:45 Clinician led psychotherapy group on managing expectations of self and others. 12:45 - 1:50 Clinician introduced topic of "Distress Tolerance". Clinician discussed "TIPS", "ACCEPTS", and "STOP" and how/when patients can employ these methods to help. Patients identified when these techniques may be helpful in their personal lives. 1:50 - 2:00 Clinician led check-out. Clinician assessed for immediate needs, medication compliance and efficacy, and safety concerns.    Suicidal/Homicidal: Yeswithout intent/plan  Therapist Response: Lori Jensen is a 46 y.o. female who presents with depression and anxiety symptoms. Patient arrived within time allowed and reports she is feeling "horrible." Patient rates her mood at a 4 on a 1- 10 scale with 10 being great. Pt states having SI over the weekend and feeling as if the reasons for living were getting "murky." Patient engaged in activity and discussion. Patient shared issues with her mom which have added additional stress and increased hopelessness. Patient demonstrates some progress as evidenced by ability to identify positive aspects of her situation and apply skills. Patient denies SI/HI/self-harm thoughts at end of session.     Plan: Patient will continue in PHP and medication  management. Work towards decreasing depression and anxiety symptoms while increasing ability to self manage and handle responsibilities   Diagnosis: Severe recurrent major depression without psychotic features ([place]) [F33.2]    1. Severe recurrent major depression without psychotic features ([place])     [name], LCSW 02/28/2017

## 2017-03-01 ENCOUNTER — Other Ambulatory Visit (HOSPITAL_COMMUNITY): Payer: 59

## 2017-03-01 ENCOUNTER — Other Ambulatory Visit (HOSPITAL_COMMUNITY): Payer: 59 | Admitting: Occupational Therapy

## 2017-03-01 ENCOUNTER — Ambulatory Visit (HOSPITAL_COMMUNITY): Payer: Self-pay | Admitting: Psychiatry

## 2017-03-01 ENCOUNTER — Other Ambulatory Visit (HOSPITAL_COMMUNITY): Payer: 59 | Admitting: Licensed Clinical Social Worker

## 2017-03-01 DIAGNOSIS — F333 Major depressive disorder, recurrent, severe with psychotic symptoms: Secondary | ICD-10-CM | POA: Diagnosis not present

## 2017-03-01 DIAGNOSIS — F332 Major depressive disorder, recurrent severe without psychotic features: Secondary | ICD-10-CM

## 2017-03-01 NOTE — Psych (Signed)
   [facility] BH PHP THERAPIST PROGRESS NOTE  BRITNEY NEWSTROM [id number]  Session Time: 9 -2   Participation Level: Active  Behavioral Response: CasualAlertDepressed  Type of Therapy: Group Therapy  Treatment Goals addressed: Coping  Interventions: CBT, DBT, Supportive and Reframing  Summary:  9:00 - 10:30 Clinician led check-in regarding current stressors and situation, and review of patient completed daily inventory. Clinician utilized active listening and empathetic response and validated patient emotions. Clinician facilitated processing group on pertinent issues.  10:30 -11:30: OT Group 11:30 - 12:45 Clinician led psychotherapy group on adult issues and how to manage. 12:45 - 1:50 Clinician continued topic of "Distress Tolerance". Clinician finished discussing "ACCEPTS" and discussed self-soothing and how/when patients can employ these methods to help. Patients identified when these techniques may be helpful in their personal lives. 1:50 - 2:00 Clinician led check-out. Clinician assessed for immediate needs, medication compliance and efficacy, and safety concerns.   Suicidal/Homicidal: Yeswithout intent/plan  Therapist Response: Lori Jensen is a 46 y.o. female who presents with depression and anxiety symptoms. Patient arrived within time allowed and reports she is feeling "a little better" due to better sleep and accomplishing some small tasks the day before. Patient rates her mood at a 4.5 on a 1- 10 scale with 10 being great. Patient engaged in activity and discussion. Patient discussed issues related to self-esteem and identified methods to help increase it. Patient demonstrates some progress as evidenced by ability to identify positive skills to use to help with own problems. Patient denies SI/HI/self-harm thoughts at end of session.     Plan: Patient will continue in PHP and medication management. Work towards decreasing depression and anxiety symptoms while increasing ability  to self manage and handle responsibilities.   Diagnosis: Severe recurrent major depression without psychotic features ([place]) [F33.2]    1. Severe recurrent major depression without psychotic features ([place])     Alvis Pulcini J Addam Goeller, LPCA 03/01/2017

## 2017-03-02 ENCOUNTER — Other Ambulatory Visit (HOSPITAL_COMMUNITY): Payer: 59

## 2017-03-02 ENCOUNTER — Other Ambulatory Visit (HOSPITAL_COMMUNITY): Payer: 59 | Admitting: Licensed Clinical Social Worker

## 2017-03-02 DIAGNOSIS — F333 Major depressive disorder, recurrent, severe with psychotic symptoms: Secondary | ICD-10-CM | POA: Diagnosis not present

## 2017-03-02 DIAGNOSIS — F332 Major depressive disorder, recurrent severe without psychotic features: Secondary | ICD-10-CM

## 2017-03-02 NOTE — Psych (Signed)
  [facility] Partial Hospitalization Program Psych Discharge Summary  Lori Jensen [id number]  Admission date: 02/03/2017 Discharge date: 03/04/2017  Reason for admission: completed IOP and still too depressed to return to work  Progress in [place]: much less depressed.  Continued to learn new ways to cope and continued applying them.  Shared more about herself and that helped.  Began to see that she could like herself better and be okay without her current boyfriend in her life  Progress (rationale): willing to work and did.  Able to practice what she learned with good result  Discharge Plan: Referral to Psychiatrist and Referral to Counselor/Psychotherapist    Donnelly Angelica, MD 03/02/2017

## 2017-03-02 NOTE — Therapy (Signed)
[place], [place], [address] Phone: [phone]   Fax:  [phone]  Occupational Therapy Treatment  Patient Details  Name: Lori Jensen MRN: [id number] Date of Birth: 1971/03/20 Referring Provider: Dr. Neita Garnet  Encounter Date: 03/01/2017      OT End of Session - 03/02/17 0823    Visit Number 3   Number of Visits 6   Date for OT Re-Evaluation 03/11/17   Authorization Type [employer]   OT Start Time 1030   OT Stop Time 1135   OT Time Calculation (min) 65 min   Activity Tolerance Patient tolerated treatment well      Past Medical History:  Diagnosis Date  . Anxiety   . Depression   . Dysmenorrhea   . Heart murmur   . Hypertension   . PONV (postoperative nausea and vomiting)     Past Surgical History:  Procedure Laterality Date  . DILATION AND CURETTAGE OF UTERUS    . DILATION AND CURETTAGE OF UTERUS  01/16/2013   Procedure: DILATATION AND CURETTAGE;  Surgeon: Gus Height, MD;  Location: [facility];  Service: Gynecology;;  . ENDOMETRIAL ABLATION    . uterine ablation  08/13/2011    There were no vitals filed for this visit.      Subjective Assessment - 03/02/17 0823    Currently in Pain? No/denies            [facility] OT Assessment - 03/02/17 0823      Assessment   Diagnosis Major Depressive Disorder         OT Group: Financial Management  S: "I pretty much live paycheck to paycheck." O:  Patient actively participated in the following skilled occupational therapy treatment session this date:             Financial management-Discussed the importance of good financial management in all  areas of life and importance of saving. Pt participated in group discussion regarding current methods of budgeting and if these are effective or ineffective. She participated in identifying the "big five" necessities for financial living (food, shelter, insurance, taxes, and Transportation).  Pt participated in group budgeting activity in which teams were given an income scenario and asked to budget for a month taking into account multiple variables (house/car/insurance/daycare/phone/etc). Teams then shared their developed budget with the group and discussed. She participated in group discussion on ways to save and how to develop a budget and spending plan-including knowing what you are bringing in (income after taxes) and knowing your fixed and variable expenses that will be going out and how to manage these areas within a budget.  A:  Patient participated in skilled occupational therapy group for financial management skills this date.  Patient was engaged and open to strategies introduced. Pt provided with Budget Binder 2018 for beginning budgeting.  P:  Continue participation in skilled occupational therapy groups 1-2 times per week for 2 weeks in order to gain the necessary skills needed to return to full time community living and learn effective coping strategies to be a productive community resident. Follow up on HEP for developing a budget or savings plan.           OT Short Term Goals - 02/24/17 2214      OT SHORT TERM GOAL #1   Title Patient will be educated on strategies to improve psychosocial skills needed to participate fully in all daily, work, and leisure activities.   Time 3  Period Weeks   Status On-going     OT SHORT TERM GOAL #2   Title Patient will be educated on a HEP and independent with implementation of HEP.   Time 3   Period Weeks   Status On-going     OT SHORT TERM GOAL #3   Title Patient will independently apply psychosocial skills and coping mechanisms to her daily activities in order to function independently.   Time 3   Period Weeks   Status On-going                  Plan - 03/02/17 0823    Rehab Potential Good   OT Frequency 2x / week   OT Duration --  3 weeks   OT Treatment/Interventions Self-care/ADL training;Patient/family  education  coping skills training, psychosocial skills training, community reintegration   Consulted and Agree with Plan of Care Patient      Patient will benefit from skilled therapeutic intervention in order to improve the following deficits and impairments:   (decreased coping skills, decreased psychosocial skils )  Visit Diagnosis: Severe recurrent major depression without psychotic features [facility])    Problem List Patient Active Problem List   Diagnosis Date Noted  . SMOKER 07/23/2009  . PALPITATIONS 06/12/2009  . PANIC ATTACKS 11/24/2006  . DEPRESSIVE DISORDER, NOS 11/24/2006  . MITRAL VALVE PROLAPSE 11/24/2006  . CHEST PAIN 11/24/2006   Lori Jensen, OTR/L  [phone] 03/02/2017, 8:25 AM  [facility] HOSPITALIZATION PROGRAM [place], [place], [address] Phone: [phone]   Fax:  [phone]  Name: Lori Jensen MRN: [id number] Date of Birth: 1970/10/19

## 2017-03-03 ENCOUNTER — Other Ambulatory Visit (HOSPITAL_COMMUNITY): Payer: 59 | Admitting: Licensed Clinical Social Worker

## 2017-03-03 ENCOUNTER — Other Ambulatory Visit (HOSPITAL_COMMUNITY): Payer: 59

## 2017-03-03 ENCOUNTER — Other Ambulatory Visit (HOSPITAL_COMMUNITY): Payer: 59 | Admitting: Specialist

## 2017-03-03 DIAGNOSIS — F331 Major depressive disorder, recurrent, moderate: Secondary | ICD-10-CM

## 2017-03-03 DIAGNOSIS — F332 Major depressive disorder, recurrent severe without psychotic features: Secondary | ICD-10-CM

## 2017-03-03 DIAGNOSIS — F333 Major depressive disorder, recurrent, severe with psychotic symptoms: Secondary | ICD-10-CM | POA: Diagnosis not present

## 2017-03-03 NOTE — Therapy (Signed)
[place], [place], [address] Phone: [phone]   Fax:  [phone]  Occupational Therapy Treatment  Patient Details  Name: Lori Jensen MRN: [id number] Date of Birth: 10/16/1970 Referring Provider: Dr. Neita Garnet  Encounter Date: 03/03/2017      OT End of Session - 03/03/17 1554    Visit Number 4   Number of Visits 6   Date for OT Re-Evaluation 03/11/17   Authorization Type [employer]   OT Start Time 1030   OT Stop Time 1130   OT Time Calculation (min) 60 min      Past Medical History:  Diagnosis Date  . Anxiety   . Depression   . Dysmenorrhea   . Heart murmur   . Hypertension   . PONV (postoperative nausea and vomiting)     Past Surgical History:  Procedure Laterality Date  . DILATION AND CURETTAGE OF UTERUS    . DILATION AND CURETTAGE OF UTERUS  01/16/2013   Procedure: DILATATION AND CURETTAGE;  Surgeon: Gus Height, MD;  Location: [facility];  Service: Gynecology;;  . ENDOMETRIAL ABLATION    . uterine ablation  08/13/2011    There were no vitals filed for this visit.      Subjective Assessment - 03/03/17 1553    Currently in Pain? No/denies            [facility] OT Assessment - 03/02/17 0823      Assessment   Diagnosis Major Depressive Disorder            S:  I have always used soft skills to advance in my career.   O:  Patient participated in job readiness occupational therapy group.  Patient was educated on definition and examples of hard vs soft skills, importance of soft skills.  Patient completed an ideal job environment and job task assessment and shared results. Patient provided education on [place] and donts of interviewing and soft skills required to maintain current employment. A:  Patient was engaged in group and reflected on her ability to progress in her career, despite lack of college education, due in large to soft skills.   P:  DC from [place] OT group.                  OT Education - 03/03/17 1553    Education provided Yes   Education Details soft skills, interview dos and donts   Person(s) Educated Patient   Methods Explanation;Handout   Comprehension Verbalized understanding          OT Short Term Goals - 03/03/17 1554      OT SHORT TERM GOAL #1   Title Patient will be educated on strategies to improve psychosocial skills needed to participate fully in all daily, work, and leisure activities.   Time 3   Period Weeks   Status Achieved     OT SHORT TERM GOAL #2   Title Patient will be educated on a HEP and independent with implementation of HEP.   Time 3   Period Weeks   Status Achieved     OT SHORT TERM GOAL #3   Title Patient will independently apply psychosocial skills and coping mechanisms to her daily activities in order to function independently.   Time 3   Period Weeks   Status Achieved                  Plan - 03/02/17 0109    Rehab  Potential Good   OT Frequency 2x / week   OT Duration --  3 weeks   OT Treatment/Interventions Self-care/ADL training;Patient/family education  coping skills training, psychosocial skills training, community reintegration   Consulted and Agree with Plan of Care Patient      Patient will benefit from skilled therapeutic intervention in order to improve the following deficits and impairments:     Visit Diagnosis: Severe recurrent major depression without psychotic features ([place])  Moderate episode of recurrent major depressive disorder ([place])    Problem List Patient Active Problem List   Diagnosis Date Noted  . SMOKER 07/23/2009  . PALPITATIONS 06/12/2009  . PANIC ATTACKS 11/24/2006  . DEPRESSIVE DISORDER, NOS 11/24/2006  . MITRAL VALVE PROLAPSE 11/24/2006  . CHEST PAIN 11/24/2006    Vangie Bicker, [place], OTR/L [phone]  03/03/2017, 3:55 PM OCCUPATIONAL THERAPY DISCHARGE SUMMARY  Visits from Start of Care: 4  Current functional level  related to goals / functional outcomes: Able to reflect on learned coping skills and psychosocial skills and apply to daily life.   Remaining deficits: n/a Education / Equipment: See above Plan: Patient agrees to discharge.  Patient goals were met. Patient is being discharged due to meeting the stated rehab goals.  ?????        Vangie Bicker, [place], OTR/L [place], [place], [address] Phone: [phone]   Fax:  [phone]  Name: Lori Jensen MRN: [id number] Date of Birth: 12-24-1970

## 2017-03-03 NOTE — Therapy (Deleted)
[place], [place], [address] Phone: [phone]   Fax:  [phone]  Occupational Therapy Evaluation  Patient Details  Name: MALYIA MORO MRN: [id number] Date of Birth: 13-Sep-1971 Referring Provider: Dr. Neita Garnet  Encounter Date: 03/03/2017      OT End of Session - 03/03/17 1554    Visit Number 4   Number of Visits 6   Date for OT Re-Evaluation 03/11/17   Authorization Type [employer]   OT Start Time 1030   OT Stop Time 1130   OT Time Calculation (min) 60 min      Past Medical History:  Diagnosis Date  . Anxiety   . Depression   . Dysmenorrhea   . Heart murmur   . Hypertension   . PONV (postoperative nausea and vomiting)     Past Surgical History:  Procedure Laterality Date  . DILATION AND CURETTAGE OF UTERUS    . DILATION AND CURETTAGE OF UTERUS  01/16/2013   Procedure: DILATATION AND CURETTAGE;  Surgeon: Gus Height, MD;  Location: [facility];  Service: Gynecology;;  . ENDOMETRIAL ABLATION    . uterine ablation  08/13/2011    There were no vitals filed for this visit.      Subjective Assessment - 03/03/17 1553    Currently in Pain? No/denies           [facility] OT Assessment - 03/02/17 0823      Assessment   Diagnosis Major Depressive Disorder                         OT Education - 03/03/17 1553    Education provided Yes   Education Details soft skills, interview dos and donts   Person(s) Educated Patient   Methods Explanation;Handout   Comprehension Verbalized understanding          OT Short Term Goals - 03/03/17 1554      OT SHORT TERM GOAL #1   Title Patient will be educated on strategies to improve psychosocial skills needed to participate fully in all daily, work, and leisure activities.   Time 3   Period Weeks   Status Achieved     OT SHORT TERM GOAL #2   Title Patient will be educated on a HEP and independent with implementation of  HEP.   Time 3   Period Weeks   Status Achieved     OT SHORT TERM GOAL #3   Title Patient will independently apply psychosocial skills and coping mechanisms to her daily activities in order to function independently.   Time 3   Period Weeks   Status Achieved                  Plan - 03/02/17 0823    Rehab Potential Good   OT Frequency 2x / week   OT Duration --  3 weeks   OT Treatment/Interventions Self-care/ADL training;Patient/family education  coping skills training, psychosocial skills training, community reintegration   Consulted and Agree with Plan of Care Patient      Patient will benefit from skilled therapeutic intervention in order to improve the following deficits and impairments:     Visit Diagnosis: Severe recurrent major depression without psychotic features ([place])  Moderate episode of recurrent major depressive disorder ([place])    Problem List Patient Active Problem List   Diagnosis Date Noted  . SMOKER 07/23/2009  . PALPITATIONS 06/12/2009  . PANIC ATTACKS 11/24/2006  .  DEPRESSIVE DISORDER, NOS 11/24/2006  . MITRAL VALVE PROLAPSE 11/24/2006  . CHEST PAIN 11/24/2006    Penelope Galas [place] 03/03/2017, 3:54 PM  [facility] PARTIAL HOSPITALIZATION PROGRAM [place], [place], [address] Phone: [phone]   Fax:  [phone]  Name: SENOVIA GAUER MRN: [id number] Date of Birth: [birth year]

## 2017-03-03 NOTE — Psych (Signed)
   [facility] BH PHP THERAPIST PROGRESS NOTE  Lori Jensen [id number]  Session Time: 9 -2   Participation Level: Active  Behavioral Response: CasualAlertDepressed  Type of Therapy: Group Therapy  Treatment Goals addressed: Coping  Interventions: CBT, DBT, Supportive and Reframing  Summary:  9:00 - 10:30 Clinician led check-in regarding current stressors and situation, and review of patient completed daily inventory. Clinician utilized active listening and empathetic response and validated patient emotions. Clinician facilitated processing group on pertinent issues.  ?10:30 -12:00 Spiritual care group 12:00 - 12:45 Clinician led psychotherapy group on how to handle major changes and the decisions around making changes in life. 12:45 - 1:50 Relaxation group: Cln Jan Fireman led yoga group focused on retraining the body's response to stress.  ?1:50 - 2:00 Clinician led check-out. Clinician assessed for immediate needs, medication compliance and efficacy, and safety concerns.   Suicidal/Homicidal: Nowithout intent/plan  Therapist Response: [name] is a 46 y.o. female who presents with depression and anxiety symptoms. Patient arrived within time allowed and reports she is feeling "good". Patient rates her mood at a 7 on a 1- 10 scale with 10 being great. Patient engaged in activity and discussion. Patient discussed issues related to guilt, specifically associated with not calling her mother daily. Patient demonstrates some progress as evidenced increased use of skills. Patient denies SI/HI/self-harm thoughts at end of session.     Plan: Patient will continue in PHP and medication management. Work towards decreasing depression and anxiety symptoms while increasing ability to self manage and handle responsibilities.   Diagnosis: Severe recurrent major depression without psychotic features ([name]) [F33.2]    1. Severe recurrent major depression without psychotic features ([name])      [name], LPCA 03/03/2017

## 2017-03-04 ENCOUNTER — Other Ambulatory Visit (HOSPITAL_COMMUNITY): Payer: 59 | Admitting: Licensed Clinical Social Worker

## 2017-03-04 ENCOUNTER — Other Ambulatory Visit (HOSPITAL_COMMUNITY): Payer: 59

## 2017-03-04 DIAGNOSIS — F332 Major depressive disorder, recurrent severe without psychotic features: Secondary | ICD-10-CM

## 2017-03-04 DIAGNOSIS — F333 Major depressive disorder, recurrent, severe with psychotic symptoms: Secondary | ICD-10-CM | POA: Diagnosis not present

## 2017-03-04 NOTE — Psych (Signed)
   [facility] BH PHP THERAPIST PROGRESS NOTE  LANDI BISCARDI [id number]  Session Time: 9 -2   Participation Level: Active  Behavioral Response: CasualAlertEuthymic  Type of Therapy: Group Therapy  Treatment Goals addressed: Coping  Interventions: CBT, DBT, Supportive and Reframing  Summary:  9:00 - 10:00 Clinician led check-in regarding current stressors and situation, and review of patient completed daily inventory. Clinician utilized active listening and empathetic response and validated patient emotions. Clinician facilitated processing group on pertinent issues.  10:00 -11:00: Clinician introduced topic of "Cognitive Distortions". Patients identified cognitive distortions they often have and ways to combat these distortions. 11:00 - 12:00: Clinician led psychotherapy group on rejection. The group watched "100 Days of Rejection" Ted-Talk. The group discussed how rejection and cognitive distortions are related.  12:00 - 12:50 Clinician introduced topic of emotional mind and how feelings do not equal fact. 12:50 - 1:00 Clinician led check-out. Clinician assessed for immediate needs, medication compliance and efficacy, and safety concerns.   Suicidal/Homicidal: Nowithout intent/plan  Therapist Response: DOMINIC RHOME is a 46 y.o. female who presents with depression and anxiety symptoms. Patient arrived within time allowed and reports she is feeling "down compared to yesterday" due to anxiety related to returning to work and leaving group. Patient rates her mood at a 3.5 on a 1- 10 scale with 10 being great. Patient discussed how it is hard experiencing the ups and downs of depression and anxiety. Patient engaged in activity and discussion. Patient demonstrates some progress as evidenced by increased insight and use of skills. Patient denies SI/HI/self-harm thoughts at end of session.     Plan: Patient will discharge from [facility] due to meeting treatment goals of decreased depression and  anxiety symptoms and increased coping abilities. Progress was measured by observation, self-report, and scales. Psychiatrist has approved discharge and patient reports alignment with discharge plan. Patient will step down to outpatient therapy and psychiatry. Patient has requested to return to her previous therapist. Patient is scheduled within this agency. Psychiatry: Dr. Daron Offer 8/1 at 11:00; Therapy: Binnie Rail 6/20 at 4:00. Patient denies any SI/HI at time of discharge.  Diagnosis: Severe recurrent major depression without psychotic features ([place]) [F33.2]    1. Severe recurrent major depression without psychotic features ([place])     Yassir Enis J Heena [place], LPCA 03/04/2017

## 2017-03-04 NOTE — Psych (Signed)
   [facility] BH PHP THERAPIST PROGRESS NOTE  Lori Jensen [id number]  Session Time: 9 -2   Participation Level: Active  Behavioral Response: CasualAlertEuthymic  Type of Therapy: Group Therapy  Treatment Goals addressed: Coping  Interventions: CBT, DBT, Supportive and Reframing  Summary:  9:00 - 10:30 Clinician led check-in regarding current stressors and situation, and review of patient completed daily inventory. Clinician utilized active listening and empathetic response and validated patient emotions. Clinician facilitated processing group on pertinent issues.  10:30 -11:30: OT Group 11:30 - 12:45 Clinician led psychotherapy group on the way the past effects our current relationships. 12:45 - 1:50 Clinician introduced topic of emotions and discussed their role in our lives and putting them in a healthy context.  1:50 - 2:00 Clinician led check-out. Clinician assessed for immediate needs, medication compliance and efficacy, and safety concerns.   Suicidal/Homicidal: Nowithout intent/plan  Therapist Response: Lori Jensen is a 46 y.o. female who presents with depression and anxiety symptoms. Patient arrived within time allowed and reports she is feeling "a little better" due to better sleep and accomplishing some small tasks the day before. Patient rates her mood at a 7 on a 1- 10 scale with 10 being great. Patient presents with brightened affect and mood stating she completed proactive tasks yesterday. Patient engaged in activity and discussion. Patient demonstrates some progress as evidenced by increased insight and positive thinking. Patient denies SI/HI/self-harm thoughts at end of session.     Plan: Patient will continue in PHP and medication management. Work towards decreasing depression and anxiety symptoms while increasing ability to self manage and handle responsibilities.   Diagnosis: Severe recurrent major depression without psychotic features ([place]) [F33.2]    1.  Severe recurrent major depression without psychotic features ([place])     [name], LCSW 03/04/2017

## 2017-03-07 ENCOUNTER — Other Ambulatory Visit (HOSPITAL_COMMUNITY): Payer: 59

## 2017-03-07 ENCOUNTER — Other Ambulatory Visit (HOSPITAL_COMMUNITY): Payer: Self-pay

## 2017-03-08 ENCOUNTER — Ambulatory Visit (HOSPITAL_COMMUNITY): Payer: Self-pay

## 2017-03-08 ENCOUNTER — Other Ambulatory Visit (HOSPITAL_COMMUNITY): Payer: 59

## 2017-03-08 ENCOUNTER — Other Ambulatory Visit (HOSPITAL_COMMUNITY): Payer: Self-pay

## 2017-03-09 ENCOUNTER — Other Ambulatory Visit (HOSPITAL_COMMUNITY): Payer: 59

## 2017-03-09 ENCOUNTER — Other Ambulatory Visit (HOSPITAL_COMMUNITY): Payer: Self-pay

## 2017-03-10 ENCOUNTER — Ambulatory Visit (HOSPITAL_COMMUNITY): Payer: Self-pay

## 2017-03-10 ENCOUNTER — Other Ambulatory Visit (HOSPITAL_COMMUNITY): Payer: Self-pay

## 2017-03-10 ENCOUNTER — Other Ambulatory Visit (HOSPITAL_COMMUNITY): Payer: 59

## 2017-03-11 ENCOUNTER — Other Ambulatory Visit (HOSPITAL_COMMUNITY): Payer: Self-pay

## 2017-03-11 ENCOUNTER — Other Ambulatory Visit (HOSPITAL_COMMUNITY): Payer: 59

## 2017-03-11 ENCOUNTER — Emergency Department (HOSPITAL_BASED_OUTPATIENT_CLINIC_OR_DEPARTMENT_OTHER)
Admission: EM | Admit: 2017-03-11 | Discharge: 2017-03-11 | Disposition: A | Discharge status: Home / Self Care | Payer: Managed Care, Other (non HMO) | Attending: Emergency Medicine | Admitting: Emergency Medicine

## 2017-03-11 ENCOUNTER — Emergency Department (HOSPITAL_BASED_OUTPATIENT_CLINIC_OR_DEPARTMENT_OTHER): Payer: Managed Care, Other (non HMO)

## 2017-03-11 ENCOUNTER — Encounter (HOSPITAL_BASED_OUTPATIENT_CLINIC_OR_DEPARTMENT_OTHER): Payer: Self-pay | Admitting: *Deleted

## 2017-03-11 DIAGNOSIS — I10 Essential (primary) hypertension: Secondary | ICD-10-CM | POA: Diagnosis not present

## 2017-03-11 DIAGNOSIS — R079 Chest pain, unspecified: Secondary | ICD-10-CM | POA: Diagnosis not present

## 2017-03-11 DIAGNOSIS — Z87891 Personal history of nicotine dependence: Secondary | ICD-10-CM | POA: Insufficient documentation

## 2017-03-11 DIAGNOSIS — Z79899 Other long term (current) drug therapy: Secondary | ICD-10-CM | POA: Insufficient documentation

## 2017-03-11 LAB — COMPREHENSIVE METABOLIC PANEL
ALT: 16 U/L (ref 14–54)
AST: 18 U/L (ref 15–41)
Albumin: 4.2 g/dL (ref 3.5–5.0)
Alkaline Phosphatase: 60 U/L (ref 38–126)
Anion gap: 9 (ref 5–15)
BILIRUBIN TOTAL: 0.6 mg/dL (ref 0.3–1.2)
BUN: 12 mg/dL (ref 6–20)
CALCIUM: 9.4 mg/dL (ref 8.9–10.3)
CO2: 28 mmol/L (ref 22–32)
CREATININE: 1.06 mg/dL — AB (ref 0.44–1.00)
Chloride: 103 mmol/L (ref 101–111)
GFR calc Af Amer: >60 mL/min (ref >60)
Glucose, Bld: 104 mg/dL — ABNORMAL HIGH (ref 65–99)
Potassium: 3.3 mmol/L — ABNORMAL LOW (ref 3.5–5.1)
Sodium: 140 mmol/L (ref 135–145)
TOTAL PROTEIN: 7.4 g/dL (ref 6.5–8.1)

## 2017-03-11 LAB — CBC WITH DIFFERENTIAL/PLATELET
BASOS ABS: 0 10*3/uL (ref 0.0–0.1)
Basophils Relative: 0 %
EOS ABS: 0.3 10*3/uL (ref 0.0–0.7)
Eosinophils Relative: 4 %
HCT: 42.4 % (ref 36.0–46.0)
Hemoglobin: 15 g/dL (ref 12.0–15.0)
Lymphocytes Relative: 32 %
Lymphs Abs: 2.9 10*3/uL (ref 0.7–4.0)
MCH: 29.4 pg (ref 26.0–34.0)
MCHC: 35.4 g/dL (ref 30.0–36.0)
MCV: 83 fL (ref 78.0–100.0)
Monocytes Absolute: 0.7 10*3/uL (ref 0.1–1.0)
Monocytes Relative: 8 %
Neutro Abs: 5.1 10*3/uL (ref 1.7–7.7)
Neutrophils Relative %: 56 %
PLATELETS: 238 10*3/uL (ref 150–400)
RBC: 5.11 MIL/uL (ref 3.87–5.11)
RDW: 13 % (ref 11.5–15.5)
WBC: 9 10*3/uL (ref 4.0–10.5)

## 2017-03-11 LAB — TROPONIN I

## 2017-03-11 MED ORDER — HYDROCODONE-ACETAMINOPHEN 5-325 MG PO TABS: 1.0000 | ORAL_TABLET | Freq: Four times a day (QID) | ORAL | 0 refills | Status: DC | PRN

## 2017-03-11 MED ORDER — KETOROLAC TROMETHAMINE 30 MG/ML IJ SOLN
30.0000 mg | Freq: Once | INTRAMUSCULAR | Status: AC
  Administered 2017-03-11: 30 mg via INTRAVENOUS
  Filled 2017-03-11: qty 1

## 2017-03-11 NOTE — ED Provider Notes (Signed)
[facility] DEPT MHP Provider Note   CSN: [id number] Arrival date & time: 03/11/17  1943  By signing my name below, I, Margit Banda, attest that this documentation has been prepared under the direction and in the presence of Veryl Speak, MD. Electronically Signed: Margit Banda, ED Scribe. 03/11/17. 8:16 PM.  History   Chief Complaint Chief Complaint  Patient presents with  . Chest Pain    HPI Lori Jensen is a 46 y.o. female with a PMHx of HTN who presents to the Emergency Department complaining of chest pain for the last hour. Pt reports she was driving when pain started. Associated sx include diaphoresis, SOB, dizziness, blurred vision, dark BM and nausea. Pain radiates to her left shoulder and hand. Deep breathes exacerbate her pain. She has never had pain like this before. Saw a cardiologist many years ago. Quit smoking in November but notes smoking this past week due to stress. No other sx noted at this time.   The history is provided by the patient. No language interpreter was used.    Past Medical History:  Diagnosis Date  . Anxiety   . Depression   . Dysmenorrhea   . Heart murmur   . Hypertension   . PONV (postoperative nausea and vomiting)     Patient Active Problem List   Diagnosis Date Noted  . SMOKER 07/23/2009  . PALPITATIONS 06/12/2009  . PANIC ATTACKS 11/24/2006  . DEPRESSIVE DISORDER, NOS 11/24/2006  . MITRAL VALVE PROLAPSE 11/24/2006  . CHEST PAIN 11/24/2006    Past Surgical History:  Procedure Laterality Date  . DILATION AND CURETTAGE OF UTERUS    . DILATION AND CURETTAGE OF UTERUS  01/16/2013   Procedure: DILATATION AND CURETTAGE;  Surgeon: Gus Height, MD;  Location: [facility];  Service: Gynecology;;  . ENDOMETRIAL ABLATION    . uterine ablation  08/13/2011    OB History    Gravida Para Term Preterm AB Living   7 2 2   5 2    SAB TAB Ectopic Multiple Live Births   3 2             Home Medications    Prior to Admission  medications   Medication Sig Start Date End Date Taking? Authorizing Provider  ALBUTEROL IN Inhale 2 puffs into the lungs every 6 (six) hours as needed (wheezing).     [provider]  ALPRAZolam Duanne Moron) 0.25 MG tablet Take 0.25 mg by mouth daily as needed. For anxiety    [provider]  bisacodyl (BISACODYL) 5 MG EC tablet Take 1 tablet (5 mg total) by mouth daily as needed for moderate constipation. 11/17/16   Constant, Peggy, MD  buPROPion (WELLBUTRIN XL) 150 MG 24 hr tablet Take 3 capsules daily 02/18/17   Eksir, Richard Miu, MD  busPIRone (BUSPAR) 10 MG tablet Take 10 mg by mouth 3 (three) times daily.    [provider]  DULoxetine (CYMBALTA) 30 MG capsule Take 30 mg by mouth daily.    [provider]  hydrochlorothiazide (HYDRODIURIL) 25 MG tablet Take 25 mg by mouth daily.  01/31/13   [provider]  HYDROcodone-acetaminophen (NORCO/VICODIN) 5-325 MG tablet Take 1 tablet by mouth every 4 (four) hours as needed. 01/23/17   Nona Dell, PA-C  ibuprofen (ADVIL,MOTRIN) 600 MG tablet Take 1 tablet (600 mg total) by mouth every 6 (six) hours as needed. 01/23/17   Nona Dell, PA-C  lisinopril (PRINIVIL,ZESTRIL) 10 MG tablet Take 10 mg by mouth daily.  [provider]  methocarbamol (ROBAXIN) 500 MG tablet Take 1 tablet (500 mg total) by mouth 2 (two) times daily. 01/23/17   Nona Dell, PA-C    Family History Family History  Problem Relation Age of Onset  . Heart disease Mother   . Anxiety disorder Mother   . Depression Mother   . Stomach cancer Father   . Heart disease Father   . Other Father        non hodgkins lymphoma  . Alcohol abuse Maternal Aunt   . Drug abuse Maternal Aunt   . Colon cancer Neg Hx     Social History Social History  Substance Use Topics  . Smoking status: Former Smoker    Packs/day: 0.50    Years: 20.00    Types: Cigarettes    Quit date: 08/13/2016  . Smokeless  tobacco: Never Used  . Alcohol use Yes     Comment: occasionally     Allergies   Codeine; Amoxicillin; and Doxycycline   Review of Systems Review of Systems  Constitutional: Positive for diaphoresis.  Eyes: Positive for visual disturbance.  Respiratory: Positive for shortness of breath.   Cardiovascular: Positive for chest pain.  Gastrointestinal: Positive for nausea.  Neurological: Positive for dizziness.  All other systems reviewed and are negative.    Physical Exam Updated Vital Signs BP 136/86 (BP Location: Right Arm)   Pulse 98   Temp 98.9 F (37.2 C) (Oral)   Resp 18   Ht 5\' 6"  (1.676 m)   Wt 200 lb (90.7 kg)   SpO2 98%   BMI 32.28 kg/m   Physical Exam  Constitutional: She is oriented to person, place, and time. She appears well-developed and well-nourished. No distress.  HENT:  Head: Normocephalic and atraumatic.  Eyes: EOM are normal.  Neck: Normal range of motion.  Cardiovascular: Normal rate, regular rhythm and normal heart sounds.   Pulmonary/Chest: Effort normal and breath sounds normal.  Abdominal: Soft. She exhibits no distension. There is no tenderness.  Musculoskeletal: Normal range of motion.  Neurological: She is alert and oriented to person, place, and time.  Skin: Skin is warm and dry.  Psychiatric: She has a normal mood and affect. Judgment normal.  Nursing note and vitals reviewed.    ED Treatments / Results  DIAGNOSTIC STUDIES: Oxygen Saturation is 98% on RA, normal by my interpretation.   COORDINATION OF CARE: 8:16 PM-Discussed next steps with pt. Pt verbalized understanding and is agreeable with the plan.   Labs (all labs ordered are listed, but only abnormal results are displayed) Labs Reviewed - No data to display  EKG  EKG Interpretation  Date/Time:  Friday March 11 2017 19:51:48 EDT Ventricular Rate:  91 PR Interval:  126 QRS Duration: 76 QT Interval:  368 QTC Calculation: 452 R Axis:   51 Text Interpretation:   Normal sinus rhythm Possible Left atrial enlargement Borderline ECG Confirmed by Veryl Speak [phone]) on 03/11/2017 8:05:21 PM       Radiology No results found.  Procedures Procedures (including critical care time)  Medications Ordered in ED Medications - No data to display   Initial Impression / Assessment and Plan / ED Course  I have reviewed the triage vital signs and the nursing notes.  Pertinent labs & imaging results that were available during my care of the patient were reviewed by me and considered in my medical decision making (see chart for details).  Patient presents here with complaints of chest discomfort, the etiology of  which I am uncertain. I suspect there may be a component of anxiety she does have a psychiatric history and is on multiple medications. There is nothing in today's workup that would indicate a cardiac etiology. Her EKG is normal and troponin is negative despite symptoms for 2 days. Chest x-ray is clear and laboratory studies are all essentially unremarkable. She was given Toradol and will be discharged with anti-inflammatories, tramadol, and when necessary follow-up if not improving.  Final Clinical Impressions(s) / ED Diagnoses   Final diagnoses:  None    New Prescriptions New Prescriptions   No medications on file   I personally performed the services described in this documentation, which was scribed in my presence. The recorded information has been reviewed and is accurate.       Veryl Speak, MD 03/11/17 2204

## 2017-03-11 NOTE — ED Triage Notes (Signed)
Chest pain for an hour. Nausea and dizziness prior to pain. Pain is worse when she takes a deep breath.

## 2017-03-11 NOTE — Discharge Instructions (Signed)
Ibuprofen 600 mg 3 times daily for the next 5 days.  Hydrocodone as prescribed as needed for pain not relieved with ibuprofen.  Follow-up with your primary Dr. if not improving in the next week, and return to the ER if symptoms significantly worsen or change.

## 2017-03-14 ENCOUNTER — Other Ambulatory Visit (HOSPITAL_COMMUNITY): Payer: Self-pay

## 2017-03-14 ENCOUNTER — Other Ambulatory Visit (HOSPITAL_COMMUNITY): Payer: 59

## 2017-03-15 ENCOUNTER — Ambulatory Visit (HOSPITAL_COMMUNITY): Payer: Self-pay

## 2017-03-15 ENCOUNTER — Other Ambulatory Visit (HOSPITAL_COMMUNITY): Payer: 59

## 2017-03-15 ENCOUNTER — Other Ambulatory Visit (HOSPITAL_COMMUNITY): Payer: Self-pay

## 2017-03-16 ENCOUNTER — Other Ambulatory Visit (HOSPITAL_COMMUNITY): Payer: Self-pay

## 2017-03-16 ENCOUNTER — Ambulatory Visit (INDEPENDENT_AMBULATORY_CARE_PROVIDER_SITE_OTHER): Payer: 59 | Admitting: Licensed Clinical Social Worker

## 2017-03-16 ENCOUNTER — Encounter (HOSPITAL_COMMUNITY): Payer: Self-pay | Admitting: Licensed Clinical Social Worker

## 2017-03-16 ENCOUNTER — Other Ambulatory Visit (HOSPITAL_COMMUNITY): Payer: 59

## 2017-03-16 DIAGNOSIS — F332 Major depressive disorder, recurrent severe without psychotic features: Secondary | ICD-10-CM

## 2017-03-16 NOTE — Progress Notes (Signed)
   THERAPIST PROGRESS NOTE  Session Time: 4:10-5pm  Participation Level: Active  Behavioral Response: CasualAlertDepressed/Tearful  Type of Therapy: Individual Therapy  Treatment Goals addressed: Coping  Interventions: Supportive  Summary: Lori Jensen is a 46 y.o. female who presents with depression for her initial individual session since her release from [facility] Pt completed MH_IOP as well.  Spent a considerable amount of time discussing her stressors and coping techniques since being in over 30 days of intense therapy. Pt discussed her psychiatric symptoms and current life events. Pt reports she has been irritable, angry (intense), crying for no apparent reason and agitated since her release from PHP. She does not have a psychiatrist appt until 8/1. Will speak with CMA Regan about pt's current symptoms. During entire session pt was tearful. She just started back to work Monday. She says its going ok at work. Her children have not said a whole lot about her moods and "sort of leave her alone." Pt also reports halluciniations while she is in bed. Pt and her long term boyfriend recently broke up. She knows its for the best but she's really sad. Will work on her tx plan at the next session.    Homicidal: Nowithout intent/plan  Therapist Response: Assessed pt's current functioning.. Assisted pt processing current depressive symptoms, stressors, coping techniques, relationships and processing for the management of stressors.      Plan: Return in 1 week.  Diagnosis: Axis I: Moderated episode of recurrent major depressive disorder        Harika Laidlaw S, LCAS 03/16/2017

## 2017-03-17 ENCOUNTER — Other Ambulatory Visit (HOSPITAL_COMMUNITY): Payer: Self-pay

## 2017-03-17 ENCOUNTER — Other Ambulatory Visit (HOSPITAL_COMMUNITY): Payer: 59

## 2017-03-17 ENCOUNTER — Ambulatory Visit (HOSPITAL_COMMUNITY): Payer: Self-pay

## 2017-03-18 ENCOUNTER — Other Ambulatory Visit (HOSPITAL_COMMUNITY): Payer: 59

## 2017-03-18 ENCOUNTER — Other Ambulatory Visit (HOSPITAL_COMMUNITY): Payer: Self-pay

## 2017-03-24 ENCOUNTER — Ambulatory Visit (HOSPITAL_COMMUNITY): Payer: Self-pay | Admitting: Licensed Clinical Social Worker

## 2017-03-29 ENCOUNTER — Ambulatory Visit (HOSPITAL_COMMUNITY): Payer: Self-pay | Admitting: Psychiatry

## 2017-03-31 ENCOUNTER — Ambulatory Visit (HOSPITAL_COMMUNITY): Payer: Self-pay | Admitting: Licensed Clinical Social Worker

## 2017-04-06 ENCOUNTER — Ambulatory Visit (INDEPENDENT_AMBULATORY_CARE_PROVIDER_SITE_OTHER): Payer: 59 | Admitting: Licensed Clinical Social Worker

## 2017-04-06 DIAGNOSIS — F331 Major depressive disorder, recurrent, moderate: Secondary | ICD-10-CM

## 2017-04-07 ENCOUNTER — Encounter (HOSPITAL_COMMUNITY): Payer: Self-pay | Admitting: Licensed Clinical Social Worker

## 2017-04-07 NOTE — Progress Notes (Signed)
   THERAPIST PROGRESS NOTE  Session Time: 4:10-5pm  Participation Level: Active  Behavioral Response: CasualAlertDepressed/Tearful  Type of Therapy: Individual Therapy  Treatment Goals addressed: Coping  Interventions: Supportive  Summary: Lori Jensen is a 45 y.o. female who presents with depression for her initial individual session. Pt discussed her psychiatric symptoms and current life events. Pt has hurt her back and had to have a MRI and awaiting results. However because of the expense of her MRI pt has not taken her bp med or her psychiatric meds. Explained to pt at length the importance of taking her meds as prescribed. Pt has completely moved away from her toxic relationship, having no contact with him. She has used her boundaries and chosen not to have contact with her mother for over a month. Her mother is very negative and it was hard for pt to continue a relationship with her at this time. Validated pt on choosing positivity. Pt reports her stress has minimized and she is working on self care. She has started seeing old friends again and trying to not isolate. Pt has no began jounaling as suggested. Pt is reading "Love yourself: your life depends on it." Processed her take away from the book with pt. Suggested again for pt to begin journaling and also meditation. Taught pt more breathing exercises which she tried in session. Suggested to continue these at home. Pt was open to suggestions and working through issues.         Homicidal: Nowithout intent/plan  Therapist Response: Assessed pt's current functioning.. Assisted pt processing current depressive symptoms, stressors, coping techniques, relationships and processing for the management of stressors.      Plan: Return in 1 week.  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

## 2017-04-11 ENCOUNTER — Telehealth (HOSPITAL_COMMUNITY): Payer: Self-pay

## 2017-04-11 DIAGNOSIS — F331 Major depressive disorder, recurrent, moderate: Secondary | ICD-10-CM

## 2017-04-11 NOTE — Telephone Encounter (Signed)
Medication refill request - Fax received from patient's [facility] for a refil of prescribed Bupropion.  Pt. canceled appt. 03/29/17 due to back pain and is rescheduled for 04/27/17. Last order 02/18/17 + 0 refills.

## 2017-04-13 ENCOUNTER — Ambulatory Visit (HOSPITAL_COMMUNITY): Payer: Self-pay | Admitting: Licensed Clinical Social Worker

## 2017-04-18 MED ORDER — BUPROPION HCL ER (XL) 150 MG PO TB24: ORAL_TABLET | ORAL | 0 refills | Status: DC

## 2017-04-18 NOTE — Telephone Encounter (Signed)
A new Bupropion order e-scribed to patient's [facility] pharmacy as approved by Dr. Daron Offer for a one month supply.  Patient returns 04/27/17 for next evaluation.

## 2017-04-18 NOTE — Telephone Encounter (Signed)
Yes that's fine 

## 2017-04-20 ENCOUNTER — Ambulatory Visit (HOSPITAL_COMMUNITY): Payer: Self-pay | Admitting: Licensed Clinical Social Worker

## 2017-04-27 ENCOUNTER — Ambulatory Visit (HOSPITAL_COMMUNITY): Payer: 59 | Admitting: Psychiatry

## 2017-05-24 ENCOUNTER — Encounter (HOSPITAL_BASED_OUTPATIENT_CLINIC_OR_DEPARTMENT_OTHER): Payer: Self-pay | Admitting: *Deleted

## 2017-05-24 DIAGNOSIS — Z79899 Other long term (current) drug therapy: Secondary | ICD-10-CM | POA: Insufficient documentation

## 2017-05-24 DIAGNOSIS — Z87891 Personal history of nicotine dependence: Secondary | ICD-10-CM | POA: Insufficient documentation

## 2017-05-24 DIAGNOSIS — I10 Essential (primary) hypertension: Secondary | ICD-10-CM | POA: Diagnosis not present

## 2017-05-24 DIAGNOSIS — R0602 Shortness of breath: Secondary | ICD-10-CM | POA: Diagnosis not present

## 2017-05-24 DIAGNOSIS — R079 Chest pain, unspecified: Secondary | ICD-10-CM | POA: Diagnosis not present

## 2017-05-24 NOTE — ED Triage Notes (Signed)
Chest pain since yesterday. Sob with ambulation. Her feet and ankles have been swelling. She did not call her MD.

## 2017-05-25 ENCOUNTER — Emergency Department (HOSPITAL_BASED_OUTPATIENT_CLINIC_OR_DEPARTMENT_OTHER)
Admission: EM | Admit: 2017-05-25 | Discharge: 2017-05-25 | Disposition: A | Discharge status: Home / Self Care | Payer: Managed Care, Other (non HMO) | Attending: Emergency Medicine | Admitting: Emergency Medicine

## 2017-05-25 ENCOUNTER — Emergency Department (HOSPITAL_BASED_OUTPATIENT_CLINIC_OR_DEPARTMENT_OTHER): Payer: Managed Care, Other (non HMO)

## 2017-05-25 DIAGNOSIS — R079 Chest pain, unspecified: Secondary | ICD-10-CM

## 2017-05-25 LAB — CBC WITH DIFFERENTIAL/PLATELET
BASOS ABS: 0 10*3/uL (ref 0.0–0.1)
Basophils Relative: 0 %
EOS ABS: 0.3 10*3/uL (ref 0.0–0.7)
Eosinophils Relative: 3 %
HEMATOCRIT: 39 % (ref 36.0–46.0)
HEMOGLOBIN: 13.3 g/dL (ref 12.0–15.0)
Lymphocytes Relative: 35 %
Lymphs Abs: 2.9 10*3/uL (ref 0.7–4.0)
MCH: 28.8 pg (ref 26.0–34.0)
MCHC: 34.1 g/dL (ref 30.0–36.0)
MCV: 84.4 fL (ref 78.0–100.0)
Monocytes Absolute: 0.6 10*3/uL (ref 0.1–1.0)
Monocytes Relative: 7 %
NEUTROS ABS: 4.7 10*3/uL (ref 1.7–7.7)
NEUTROS PCT: 55 %
Platelets: 220 10*3/uL (ref 150–400)
RBC: 4.62 MIL/uL (ref 3.87–5.11)
RDW: 12.8 % (ref 11.5–15.5)
WBC: 8.4 10*3/uL (ref 4.0–10.5)

## 2017-05-25 LAB — COMPREHENSIVE METABOLIC PANEL
ALBUMIN: 3.9 g/dL (ref 3.5–5.0)
ALT: 15 U/L (ref 14–54)
ANION GAP: 8 (ref 5–15)
AST: 15 U/L (ref 15–41)
Alkaline Phosphatase: 56 U/L (ref 38–126)
BUN: 11 mg/dL (ref 6–20)
CHLORIDE: 102 mmol/L (ref 101–111)
CO2: 28 mmol/L (ref 22–32)
Calcium: 9 mg/dL (ref 8.9–10.3)
Creatinine, Ser: 0.81 mg/dL (ref 0.44–1.00)
Glucose, Bld: 105 mg/dL — ABNORMAL HIGH (ref 65–99)
POTASSIUM: 3.4 mmol/L — AB (ref 3.5–5.1)
SODIUM: 138 mmol/L (ref 135–145)
Total Bilirubin: 0.3 mg/dL (ref 0.3–1.2)
Total Protein: 6.7 g/dL (ref 6.5–8.1)

## 2017-05-25 LAB — TROPONIN I: Troponin I: <0.03 ng/mL (ref <0.03)

## 2017-05-25 LAB — D-DIMER, QUANTITATIVE (NOT AT ARMC): D DIMER QUANT: 0.35 ug{FEU}/mL (ref 0.00–0.50)

## 2017-05-25 LAB — BRAIN NATRIURETIC PEPTIDE: B NATRIURETIC PEPTIDE 5: 17.4 pg/mL (ref 0.0–100.0)

## 2017-05-25 MED ORDER — KETOROLAC TROMETHAMINE 30 MG/ML IJ SOLN
30.0000 mg | Freq: Once | INTRAMUSCULAR | Status: AC
  Administered 2017-05-25: 30 mg via INTRAVENOUS
  Filled 2017-05-25: qty 1

## 2017-05-25 MED ORDER — MELOXICAM 15 MG PO TABS: 15.0000 mg | ORAL_TABLET | Freq: Every day | ORAL | 0 refills | Status: DC

## 2017-05-25 NOTE — ED Provider Notes (Signed)
[facility] Provider Note   CSN: [id number] Arrival date & time: 05/24/17  2203     History   Chief Complaint Chief Complaint  Patient presents with  . Chest Pain  . Shortness of Breath    HPI Lori Jensen is a 46 y.o. female.  Patient presents for evaluation of chest pain and shortness of breath. Patient reports a deep aching pain in the center of her chest that occasionally radiates to the back. Symptoms started yesterday and has been continuous. She reports that she has noticed shortness of breath and swelling of her feet. Shortness of breath is present even at rest but worsens if she tries to walk or exert herself.      Past Medical History:  Diagnosis Date  . Anxiety   . Depression   . Dysmenorrhea   . Heart murmur   . Hypertension   . PONV (postoperative nausea and vomiting)     Patient Active Problem List   Diagnosis Date Noted  . SMOKER 07/23/2009  . PALPITATIONS 06/12/2009  . PANIC ATTACKS 11/24/2006  . DEPRESSIVE DISORDER, NOS 11/24/2006  . MITRAL VALVE PROLAPSE 11/24/2006  . CHEST PAIN 11/24/2006    Past Surgical History:  Procedure Laterality Date  . DILATION AND CURETTAGE OF UTERUS    . DILATION AND CURETTAGE OF UTERUS  01/16/2013   Procedure: DILATATION AND CURETTAGE;  Surgeon: Gus Height, MD;  Location: [facility];  Service: Gynecology;;  . ENDOMETRIAL ABLATION    . uterine ablation  08/13/2011    OB History    Gravida Para Term Preterm AB Living   7 2 2   5 2    SAB TAB Ectopic Multiple Live Births   3 2             Home Medications    Prior to Admission medications   Medication Sig Start Date End Date Taking? Authorizing Provider  ALBUTEROL IN Inhale 2 puffs into the lungs every 6 (six) hours as needed (wheezing).     [provider]  ALPRAZolam Duanne Moron) 0.25 MG tablet Take 0.25 mg by mouth daily as needed. For anxiety    [provider]  bisacodyl (BISACODYL) 5 MG EC tablet Take 1 tablet (5 mg total) by  mouth daily as needed for moderate constipation. 11/17/16   Constant, Peggy, MD  buPROPion (WELLBUTRIN XL) 150 MG 24 hr tablet Take 3 capsules daily 04/18/17   Eksir, Richard Miu, MD  busPIRone (BUSPAR) 10 MG tablet Take 10 mg by mouth 3 (three) times daily.    [provider]  DULoxetine (CYMBALTA) 30 MG capsule Take 30 mg by mouth daily.    [provider]  hydrochlorothiazide (HYDRODIURIL) 25 MG tablet Take 25 mg by mouth daily.  01/31/13   [provider]  HYDROcodone-acetaminophen (NORCO/VICODIN) 5-325 MG tablet Take 1-2 tablets by mouth every 6 (six) hours as needed. 03/11/17   Veryl Speak, MD  ibuprofen (ADVIL,MOTRIN) 600 MG tablet Take 1 tablet (600 mg total) by mouth every 6 (six) hours as needed. 01/23/17   Nona Dell, PA-C  lisinopril (PRINIVIL,ZESTRIL) 10 MG tablet Take 10 mg by mouth daily.    [provider]  methocarbamol (ROBAXIN) 500 MG tablet Take 1 tablet (500 mg total) by mouth 2 (two) times daily. 01/23/17   Nona Dell, PA-C    Family History Family History  Problem Relation Age of Onset  . Heart disease Mother   . Anxiety disorder Mother   . Depression  Mother   . Stomach cancer Father   . Heart disease Father   . Other Father        non hodgkins lymphoma  . Alcohol abuse Maternal Aunt   . Drug abuse Maternal Aunt   . Colon cancer Neg Hx     Social History Social History  Substance Use Topics  . Smoking status: Former Smoker    Packs/day: 0.50    Years: 20.00    Types: Cigarettes    Quit date: 08/13/2016  . Smokeless tobacco: Never Used  . Alcohol use Yes     Comment: occasionally     Allergies   Codeine; Amoxicillin; and Doxycycline   Review of Systems Review of Systems  Respiratory: Positive for shortness of breath.   Cardiovascular: Positive for chest pain and leg swelling.  All other systems reviewed and are negative.    Physical Exam Updated Vital Signs BP (!) 128/93   Pulse  70   Temp 98.4 F (36.9 C) (Oral)   Resp 17   Ht 5\' 6"  (1.676 m)   Wt 95.3 kg (210 lb)   SpO2 99%   BMI 33.89 kg/m   Physical Exam  Constitutional: She is oriented to person, place, and time. She appears well-developed and well-nourished. No distress.  HENT:  Head: Normocephalic and atraumatic.  Right Ear: Hearing normal.  Left Ear: Hearing normal.  Nose: Nose normal.  Mouth/Throat: Oropharynx is clear and moist and mucous membranes are normal.  Eyes: Pupils are equal, round, and reactive to light. Conjunctivae and EOM are normal.  Neck: Normal range of motion. Neck supple.  Cardiovascular: Regular rhythm, S1 normal and S2 normal.  Exam reveals no gallop and no friction rub.   No murmur heard. Pulmonary/Chest: Effort normal and breath sounds normal. No respiratory distress. She exhibits no tenderness.  Abdominal: Soft. Normal appearance and bowel sounds are normal. There is no hepatosplenomegaly. There is no tenderness. There is no rebound, no guarding, no tenderness at McBurney's point and negative Murphy's sign. No hernia.  Musculoskeletal: Normal range of motion. She exhibits edema (trace pedal).  Neurological: She is alert and oriented to person, place, and time. She has normal strength. No cranial nerve deficit or sensory deficit. Coordination normal. GCS eye subscore is 4. GCS verbal subscore is 5. GCS motor subscore is 6.  Skin: Skin is warm, dry and intact. No rash noted. No cyanosis.  Psychiatric: She has a normal mood and affect. Her speech is normal and behavior is normal. Thought content normal.  Nursing note and vitals reviewed.    ED Treatments / Results  Labs (all labs ordered are listed, but only abnormal results are displayed) Labs Reviewed  COMPREHENSIVE METABOLIC PANEL - Abnormal; Notable for the following:       Result Value   Potassium 3.4 (*)    Glucose, Bld 105 (*)    All other components within normal limits  CBC WITH DIFFERENTIAL/PLATELET  BRAIN  NATRIURETIC PEPTIDE  D-DIMER, QUANTITATIVE (NOT AT [facility])  TROPONIN I    EKG  EKG Interpretation  Date/Time:  Tuesday May 24 2017 22:08:54 EDT Ventricular Rate:  85 PR Interval:  134 QRS Duration: 76 QT Interval:  382 QTC Calculation: 454 R Axis:   27 Text Interpretation:  Normal sinus rhythm Cannot rule out Anterior infarct , age undetermined Abnormal ECG Confirmed by Orpah Greek [phone]) on 05/25/2017 12:41:45 AM       Radiology No results found.  Procedures Procedures (including critical care time)  Medications Ordered in ED Medications - No data to display   Initial Impression / Assessment and Plan / ED Course  I have reviewed the triage vital signs and the nursing notes.  Pertinent labs & imaging results that were available during my care of the patient were reviewed by me and considered in my medical decision making (see chart for details).     Patient presents to the ER for evaluation of chest pain and shortness of breath. Symptoms began yesterday. She reports that she has had persistent pain in the center of her chest that radiates to the back at times. This worsens if she takes deep breath or coughs. She has noticed that she is short of breath, this worsens with exertion. Patient has a normal EKG. Troponin was negative. Patient has minimal cardiac risk factors. She has a heart score of 3 (1 point for age 64, 2 point for risk factors (HTN, Obesity, family history)  Troponin is negative. Patient's symptoms seem to be linked to breathing. She is not tachycardic, tachypnea or hypoxic. D-dimer was normal. She does not have any significant PE risk factors other than hypertension. She does not have any unilateral calf swelling, tenderness. She seems to be very low risk for DVT/PE overall and with a normal d-dimer, it is felt that this is very unlikely.  Patient reports that she has gained a lot of weight recently secondary to chronic depression. Some of her  shortness of breath is likely secondary to a complicated deconditioning. At this point I do not feel that the patient has significant risk for cardiac etiology or serious cardiac events, appropriate for discharge and follow-up with primary care doctor.  Final Clinical Impressions(s) / ED Diagnoses   Final diagnoses:  Chest pain, unspecified type    New Prescriptions New Prescriptions   No medications on file     Orpah Greek, MD 05/25/17 [phone]

## 2017-06-27 ENCOUNTER — Ambulatory Visit (HOSPITAL_COMMUNITY): Payer: Self-pay | Admitting: Psychiatry

## 2017-07-07 ENCOUNTER — Telehealth (HOSPITAL_COMMUNITY): Payer: Self-pay

## 2017-07-07 NOTE — Telephone Encounter (Signed)
Medication refill - Fax received from patient's [facility] requesting a new order for her Bupropion XL 150 mg, 3 a day, last provided 04/18/17 for 1 time and pt. has not bee since since left [facility] in May 2018. No showed last on 06/27/17.  No current appointment scheduled and patient has not seen Dr. Daron Offer since left [facility].

## 2017-07-07 NOTE — Telephone Encounter (Signed)
Medication management - message left for patient she would need to call for an appointment and to discuss any further refills due to not being seen since 01/2017.

## 2017-07-07 NOTE — Telephone Encounter (Signed)
Sounds like she has been off of this for 2 months. If she wants to restart, she needs to visit with [place] or go talk with her pcp about being managed on this medication.

## 2017-07-15 IMAGING — CR DG CHEST 2V
2 series · 2 of 2 positions shown · non-contrast
Comparison: 06/30/2016

CLINICAL DATA: Chest pain.

EXAM:
CHEST  2 VIEW

[w chest pa]
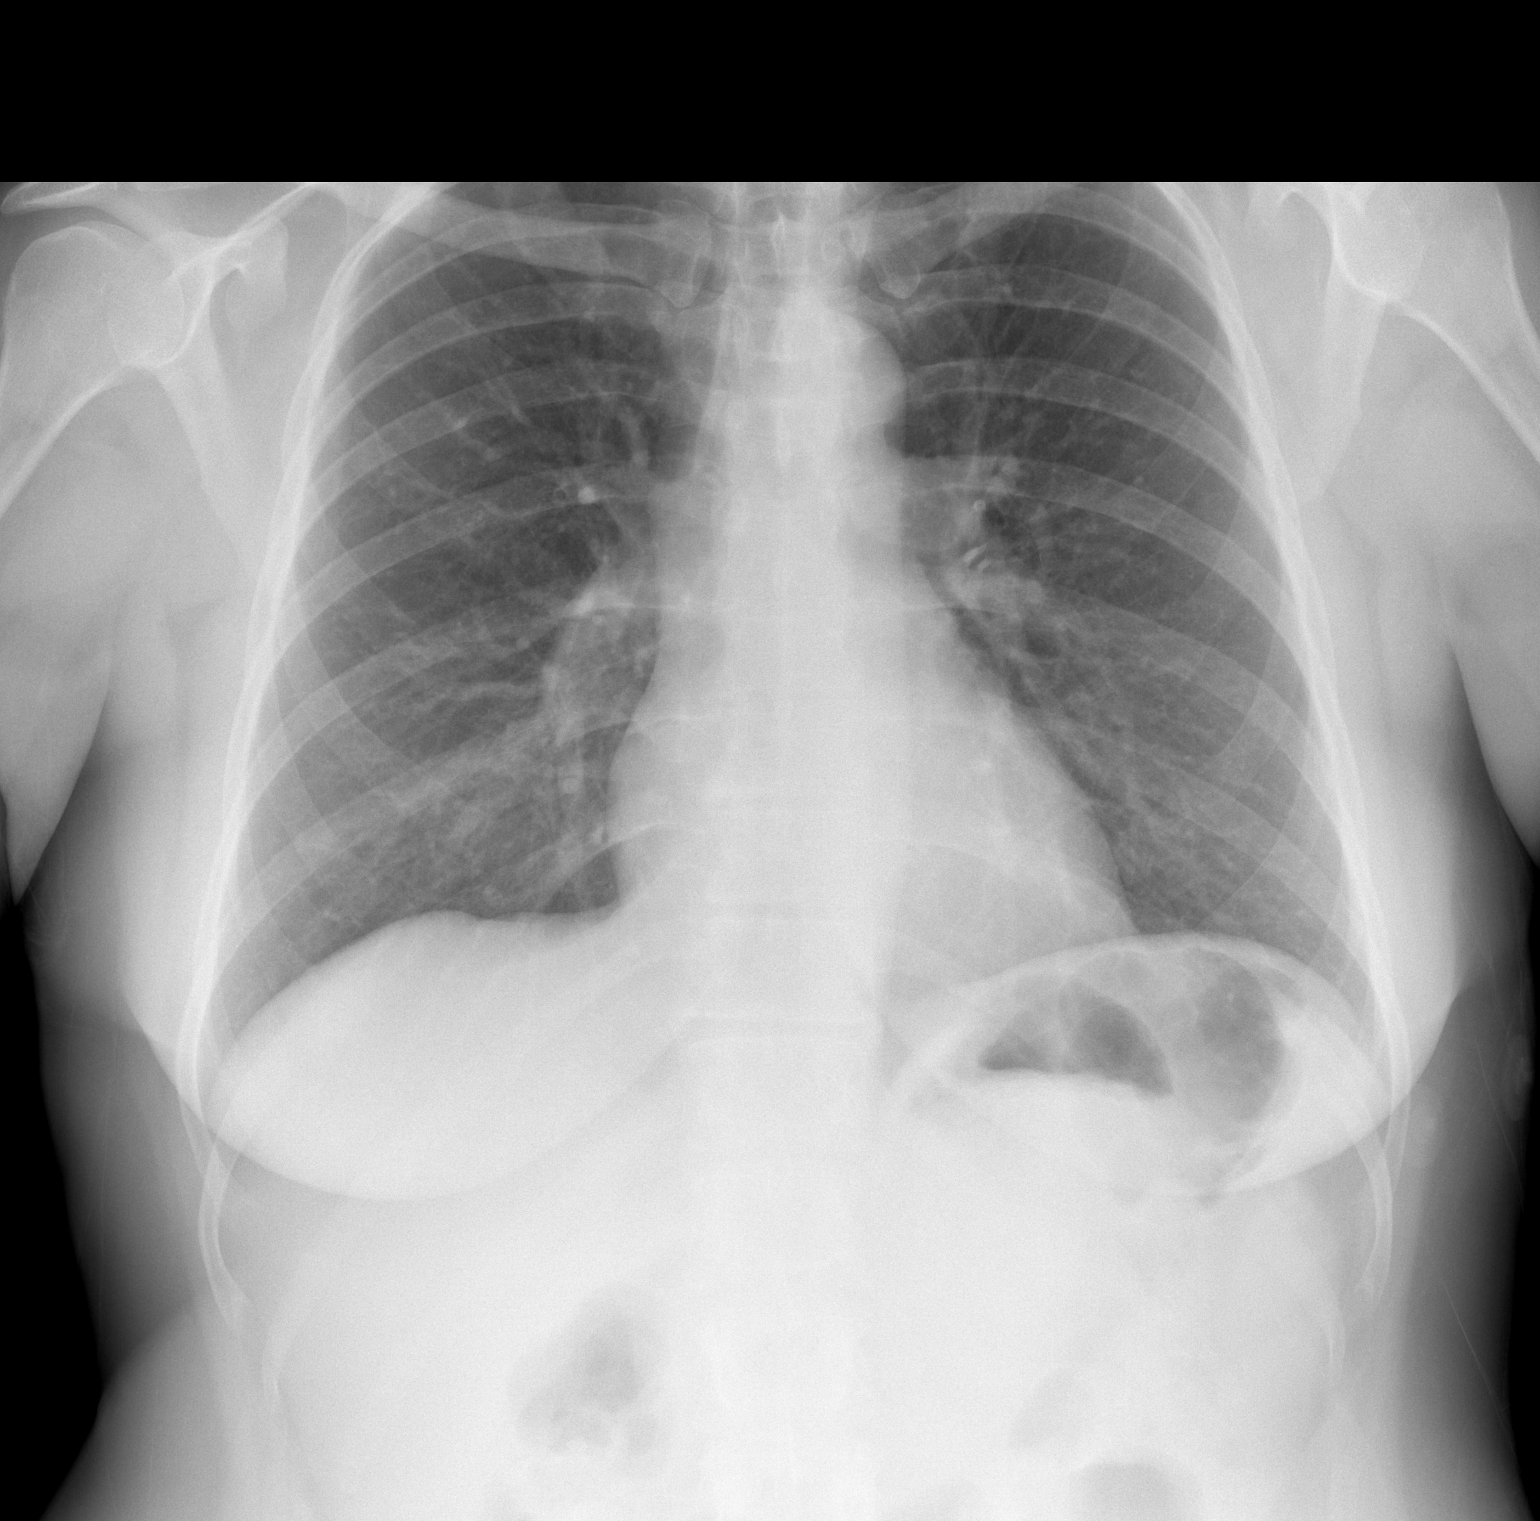

[w chest lat]
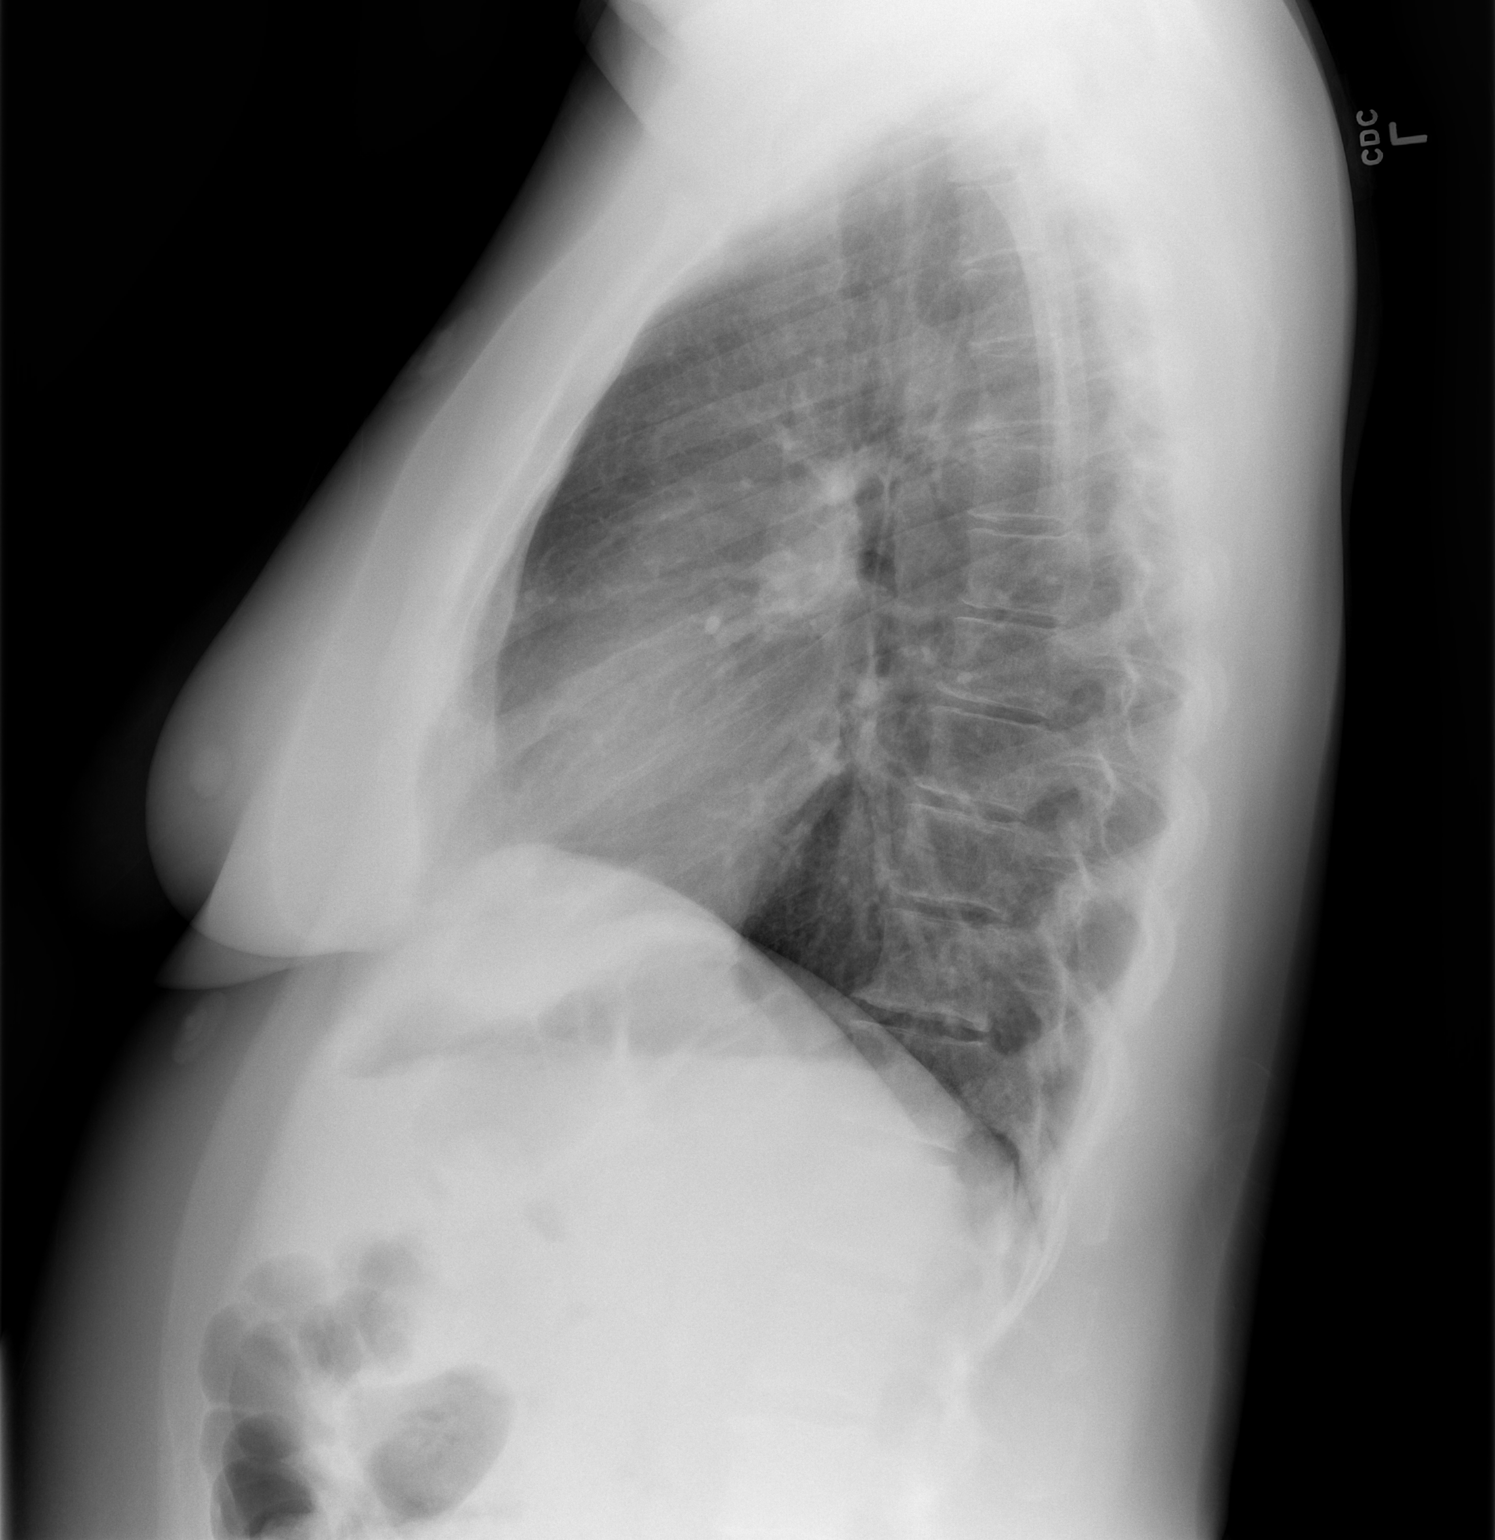

[2 of 2 positions shown; findings below may reference images not displayed]

FINDINGS: Cardiomediastinal silhouette is normal. Mediastinal contours appear
intact.

There is no evidence of focal airspace consolidation, pleural
effusion or pneumothorax.

Osseous structures are without acute abnormality. Soft tissues are
grossly normal.
IMPRESSION: No active cardiopulmonary disease.

## 2017-07-25 ENCOUNTER — Emergency Department (HOSPITAL_BASED_OUTPATIENT_CLINIC_OR_DEPARTMENT_OTHER): Payer: 59

## 2017-07-25 ENCOUNTER — Encounter (HOSPITAL_BASED_OUTPATIENT_CLINIC_OR_DEPARTMENT_OTHER): Payer: Self-pay | Admitting: Emergency Medicine

## 2017-07-25 ENCOUNTER — Emergency Department (HOSPITAL_BASED_OUTPATIENT_CLINIC_OR_DEPARTMENT_OTHER)
Admission: EM | Admit: 2017-07-25 | Discharge: 2017-07-25 | Disposition: A | Discharge status: Home / Self Care | Payer: 59 | Attending: Emergency Medicine | Admitting: Emergency Medicine

## 2017-07-25 DIAGNOSIS — R5383 Other fatigue: Secondary | ICD-10-CM | POA: Diagnosis not present

## 2017-07-25 DIAGNOSIS — R0602 Shortness of breath: Secondary | ICD-10-CM | POA: Insufficient documentation

## 2017-07-25 DIAGNOSIS — R5381 Other malaise: Secondary | ICD-10-CM | POA: Insufficient documentation

## 2017-07-25 DIAGNOSIS — I10 Essential (primary) hypertension: Secondary | ICD-10-CM | POA: Insufficient documentation

## 2017-07-25 DIAGNOSIS — Z79899 Other long term (current) drug therapy: Secondary | ICD-10-CM | POA: Diagnosis not present

## 2017-07-25 DIAGNOSIS — Z87891 Personal history of nicotine dependence: Secondary | ICD-10-CM | POA: Diagnosis not present

## 2017-07-25 DIAGNOSIS — R002 Palpitations: Secondary | ICD-10-CM

## 2017-07-25 LAB — LIPASE, BLOOD: LIPASE: 28 U/L (ref 11–51)

## 2017-07-25 LAB — URINALYSIS, ROUTINE W REFLEX MICROSCOPIC
Bilirubin Urine: NEGATIVE
GLUCOSE, UA: NEGATIVE mg/dL
Hgb urine dipstick: NEGATIVE
Ketones, ur: NEGATIVE mg/dL
Leukocytes, UA: NEGATIVE
Nitrite: NEGATIVE
PROTEIN: NEGATIVE mg/dL
pH: 7 (ref 5.0–8.0)

## 2017-07-25 LAB — BASIC METABOLIC PANEL
Anion gap: 6 (ref 5–15)
BUN: 11 mg/dL (ref 6–20)
CHLORIDE: 104 mmol/L (ref 101–111)
CO2: 28 mmol/L (ref 22–32)
Calcium: 9.1 mg/dL (ref 8.9–10.3)
Creatinine, Ser: 0.68 mg/dL (ref 0.44–1.00)
GFR calc Af Amer: >60 mL/min (ref >60)
GFR calc non Af Amer: >60 mL/min (ref >60)
Glucose, Bld: 89 mg/dL (ref 65–99)
POTASSIUM: 3.3 mmol/L — AB (ref 3.5–5.1)
SODIUM: 138 mmol/L (ref 135–145)

## 2017-07-25 LAB — CBC WITH DIFFERENTIAL/PLATELET
Basophils Absolute: 0 10*3/uL (ref 0.0–0.1)
Basophils Relative: 0 %
EOS ABS: 0.3 10*3/uL (ref 0.0–0.7)
Eosinophils Relative: 3 %
HEMATOCRIT: 41.1 % (ref 36.0–46.0)
HEMOGLOBIN: 13.7 g/dL (ref 12.0–15.0)
LYMPHS ABS: 3 10*3/uL (ref 0.7–4.0)
LYMPHS PCT: 31 %
MCH: 28.1 pg (ref 26.0–34.0)
MCHC: 33.3 g/dL (ref 30.0–36.0)
MCV: 84.2 fL (ref 78.0–100.0)
Monocytes Absolute: 0.7 10*3/uL (ref 0.1–1.0)
Monocytes Relative: 7 %
NEUTROS ABS: 5.7 10*3/uL (ref 1.7–7.7)
NEUTROS PCT: 59 %
Platelets: 250 10*3/uL (ref 150–400)
RBC: 4.88 MIL/uL (ref 3.87–5.11)
RDW: 13.2 % (ref 11.5–15.5)
WBC: 9.7 10*3/uL (ref 4.0–10.5)

## 2017-07-25 LAB — HEPATIC FUNCTION PANEL
ALT: 15 U/L (ref 14–54)
AST: 16 U/L (ref 15–41)
Albumin: 3.9 g/dL (ref 3.5–5.0)
Alkaline Phosphatase: 67 U/L (ref 38–126)
TOTAL PROTEIN: 6.9 g/dL (ref 6.5–8.1)
Total Bilirubin: 0.4 mg/dL (ref 0.3–1.2)

## 2017-07-25 LAB — TROPONIN I

## 2017-07-25 MED ORDER — SODIUM CHLORIDE 0.9 % IV BOLUS (SEPSIS)
500.0000 mL | Freq: Once | INTRAVENOUS | Status: AC
  Administered 2017-07-25: 500 mL via INTRAVENOUS

## 2017-07-25 MED ORDER — SODIUM CHLORIDE 0.9 % IV SOLN: INTRAVENOUS | Status: DC

## 2017-07-25 MED ORDER — NAPROXEN 500 MG PO TABS: 500.0000 mg | ORAL_TABLET | Freq: Two times a day (BID) | ORAL | 0 refills | Status: DC

## 2017-07-25 MED ORDER — KETOROLAC TROMETHAMINE 30 MG/ML IJ SOLN
30.0000 mg | Freq: Once | INTRAMUSCULAR | Status: AC
  Administered 2017-07-25: 30 mg via INTRAVENOUS
  Filled 2017-07-25: qty 1

## 2017-07-25 NOTE — ED Triage Notes (Signed)
Patient states that she has a hx of palpitations. Reports that today it has been worse, with some tightness to her left neck - patient reports that she is also SOB

## 2017-07-25 NOTE — Discharge Instructions (Signed)
Follow-up with your doctor.  Return for any new or worse symptoms.  Today's workup without any significant findings other than some mild hypokalemia.  Take the Naprosyn as directed.  Follow-up if not improving over the next several days.

## 2017-07-25 NOTE — ED Provider Notes (Signed)
[facility] EMERGENCY DEPARTMENT Provider Note   CSN: [id number] Arrival date & time: 07/25/17  1736     History   Chief Complaint Chief Complaint  Patient presents with  . Palpitations    HPI Lori Jensen is a 46 y.o. female.  Patient presents with a history of heart palpitations fatigue malaise not feeling well no specific symptoms some body aches almost feeling like she had the flu.  But no cough or congestion this is been ongoing for 2 weeks.  Feeling like having a fever but no fever documented.  She also had some mild shortness of breath.  And felt like she had some adenopathy around her neck area.  Patient did not have the flu vaccine recently.  Patient did not have it at all      Past Medical History:  Diagnosis Date  . Anxiety   . Depression   . Dysmenorrhea   . Heart murmur   . Hypertension   . PONV (postoperative nausea and vomiting)     Patient Active Problem List   Diagnosis Date Noted  . SMOKER 07/23/2009  . PALPITATIONS 06/12/2009  . PANIC ATTACKS 11/24/2006  . DEPRESSIVE DISORDER, NOS 11/24/2006  . MITRAL VALVE PROLAPSE 11/24/2006  . CHEST PAIN 11/24/2006    Past Surgical History:  Procedure Laterality Date  . DILATION AND CURETTAGE OF UTERUS    . DILATION AND CURETTAGE OF UTERUS  01/16/2013   Procedure: DILATATION AND CURETTAGE;  Surgeon: Gus Height, MD;  Location: [facility];  Service: Gynecology;;  . ENDOMETRIAL ABLATION    . uterine ablation  08/13/2011    OB History    Gravida Para Term Preterm AB Living   7 2 2   5 2    SAB TAB Ectopic Multiple Live Births   3 2             Home Medications    Prior to Admission medications   Medication Sig Start Date End Date Taking? Authorizing Provider  ALBUTEROL IN Inhale 2 puffs into the lungs every 6 (six) hours as needed (wheezing).     [provider]  ALPRAZolam Duanne Moron) 0.25 MG tablet Take 0.25 mg by mouth daily as needed. For anxiety    [provider]    bisacodyl (BISACODYL) 5 MG EC tablet Take 1 tablet (5 mg total) by mouth daily as needed for moderate constipation. 11/17/16   Constant, Peggy, MD  buPROPion (WELLBUTRIN XL) 150 MG 24 hr tablet Take 3 capsules daily 04/18/17   Eksir, Richard Miu, MD  busPIRone (BUSPAR) 10 MG tablet Take 10 mg by mouth 3 (three) times daily.    [provider]  DULoxetine (CYMBALTA) 30 MG capsule Take 30 mg by mouth daily.    [provider]  hydrochlorothiazide (HYDRODIURIL) 25 MG tablet Take 25 mg by mouth daily.  01/31/13   [provider]  HYDROcodone-acetaminophen (NORCO/VICODIN) 5-325 MG tablet Take 1-2 tablets by mouth every 6 (six) hours as needed. 03/11/17   Veryl Speak, MD  ibuprofen (ADVIL,MOTRIN) 600 MG tablet Take 1 tablet (600 mg total) by mouth every 6 (six) hours as needed. 01/23/17   Nona Dell, PA-C  lisinopril (PRINIVIL,ZESTRIL) 10 MG tablet Take 10 mg by mouth daily.    [provider]  meloxicam (MOBIC) 15 MG tablet Take 1 tablet (15 mg total) by mouth daily. 05/25/17   Orpah Greek, MD  methocarbamol (ROBAXIN) 500 MG tablet Take 1 tablet (500 mg total) by mouth 2 (  two) times daily. 01/23/17   Nona Dell, PA-C  naproxen (NAPROSYN) 500 MG tablet Take 1 tablet (500 mg total) by mouth 2 (two) times daily. 07/25/17   Fredia Sorrow, MD    Family History Family History  Problem Relation Age of Onset  . Heart disease Mother   . Anxiety disorder Mother   . Depression Mother   . Stomach cancer Father   . Heart disease Father   . Other Father        non hodgkins lymphoma  . Alcohol abuse Maternal Aunt   . Drug abuse Maternal Aunt   . Colon cancer Neg Hx     Social History Social History  Substance Use Topics  . Smoking status: Former Smoker    Packs/day: 0.50    Years: 20.00    Types: Cigarettes    Quit date: 08/13/2016  . Smokeless tobacco: Never Used  . Alcohol use Yes     Comment: occasionally      Allergies   Codeine; Amoxicillin; and Doxycycline   Review of Systems Review of Systems  Constitutional: Negative for fever.  HENT: Negative for congestion.   Eyes: Negative for visual disturbance.  Respiratory: Positive for shortness of breath.   Cardiovascular: Positive for palpitations. Negative for chest pain.  Gastrointestinal: Negative for abdominal pain, diarrhea, nausea and vomiting.  Genitourinary: Negative for dysuria.  Musculoskeletal: Positive for myalgias.  Skin: Negative for rash.  Neurological: Negative for dizziness, syncope, weakness, numbness and headaches.  Hematological: Does not bruise/bleed easily.  Psychiatric/Behavioral: Negative for confusion.     Physical Exam Updated Vital Signs BP 122/76   Pulse 75   Temp 98.2 F (36.8 C) (Oral)   Resp 16   Ht 1.676 m (5\' 6" )   Wt 95.3 kg (210 lb)   SpO2 98%   BMI 33.89 kg/m   Physical Exam  Constitutional: She is oriented to person, place, and time. She appears well-developed and well-nourished. No distress.  HENT:  Head: Normocephalic and atraumatic.  Mouth/Throat: Oropharynx is clear and moist.  Eyes: Pupils are equal, round, and reactive to light. Conjunctivae and EOM are normal.  Neck: Normal range of motion. Neck supple.  Cardiovascular: Normal rate, regular rhythm and normal heart sounds.   No murmur heard. Pulmonary/Chest: Effort normal and breath sounds normal. No respiratory distress.  Abdominal: Soft. Bowel sounds are normal. There is no tenderness.  Musculoskeletal: Normal range of motion. She exhibits no edema.  Neurological: She is alert and oriented to person, place, and time. No cranial nerve deficit or sensory deficit. She exhibits normal muscle tone. Coordination normal.  Skin: Skin is warm. No rash noted.  Nursing note and vitals reviewed.    ED Treatments / Results  Labs (all labs ordered are listed, but only abnormal results are displayed) Labs Reviewed  BASIC METABOLIC  PANEL - Abnormal; Notable for the following:       Result Value   Potassium 3.3 (*)    All other components within normal limits  URINALYSIS, ROUTINE W REFLEX MICROSCOPIC - Abnormal; Notable for the following:    Specific Gravity, Urine <1.005 (*)    All other components within normal limits  HEPATIC FUNCTION PANEL - Abnormal; Notable for the following:    Bilirubin, Direct <0.1 (*)    All other components within normal limits  CBC WITH DIFFERENTIAL/PLATELET  TROPONIN I  LIPASE, BLOOD    EKG  EKG Interpretation  Date/Time:  Monday July 25 2017 17:43:23 EDT Ventricular Rate:  87  PR Interval:  122 QRS Duration: 76 QT Interval:  376 QTC Calculation: 452 R Axis:   51 Text Interpretation:  Normal sinus rhythm Normal ECG Confirmed by Fredia Sorrow [phone]) on 07/25/2017 5:49:27 PM       Radiology Dg Chest 2 View  Result Date: 07/25/2017 CLINICAL DATA:  Heart palpitations a left-sided chest pain and shortness of breath. EXAM: CHEST  2 VIEW COMPARISON:  05/25/2017 FINDINGS: The lungs are clear without focal pneumonia, edema, pneumothorax or pleural effusion. The cardiopericardial silhouette is within normal limits for size. The visualized bony structures of the thorax are intact. Telemetry leads overlie the chest. IMPRESSION: Stable.  No acute findings. Electronically Signed   By: Misty Stanley M.D.   On: 07/25/2017 19:16    Procedures Procedures (including critical care time)  Medications Ordered in ED Medications  0.9 %  sodium chloride infusion ( Intravenous Rate/Dose Change 07/25/17 2015)  sodium chloride 0.9 % bolus 500 mL (0 mLs Intravenous Stopped 07/25/17 2049)  ketorolac (TORADOL) 30 MG/ML injection 30 mg (30 mg Intravenous Given 07/25/17 2138)     Initial Impression / Assessment and Plan / ED Course  I have reviewed the triage vital signs and the nursing notes.  Pertinent labs & imaging results that were available during my care of the patient were reviewed  by me and considered in my medical decision making (see chart for details).     Workup without any significant findings.  Chest x-ray negative.  Labs without significant abnormalities other than some mild hypokalemia.  Symptoms could be viral in nature.  Improved with Toradol.  Continue Naprosyn at home and follow-up with your doctor.  Return for any new or worse symptoms.  Final Clinical Impressions(s) / ED Diagnoses   Final diagnoses:  Palpitations  Malaise and fatigue    New Prescriptions New Prescriptions   NAPROXEN (NAPROSYN) 500 MG TABLET    Take 1 tablet (500 mg total) by mouth 2 (two) times daily.     Fredia Sorrow, MD 07/26/17 [phone]

## 2017-09-07 ENCOUNTER — Encounter (HOSPITAL_COMMUNITY): Payer: Self-pay | Admitting: Licensed Clinical Social Worker

## 2017-09-07 ENCOUNTER — Other Ambulatory Visit (HOSPITAL_COMMUNITY): Payer: 59

## 2017-09-07 ENCOUNTER — Other Ambulatory Visit (HOSPITAL_COMMUNITY): Payer: 59 | Attending: Psychiatry | Admitting: Licensed Clinical Social Worker

## 2017-09-07 DIAGNOSIS — R4589 Other symptoms and signs involving emotional state: Secondary | ICD-10-CM | POA: Diagnosis not present

## 2017-09-07 DIAGNOSIS — F332 Major depressive disorder, recurrent severe without psychotic features: Secondary | ICD-10-CM | POA: Insufficient documentation

## 2017-09-07 DIAGNOSIS — F411 Generalized anxiety disorder: Secondary | ICD-10-CM | POA: Diagnosis not present

## 2017-09-07 DIAGNOSIS — Z79899 Other long term (current) drug therapy: Secondary | ICD-10-CM | POA: Insufficient documentation
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
## 2017-09-08 ENCOUNTER — Other Ambulatory Visit (HOSPITAL_COMMUNITY): Payer: 59 | Admitting: Licensed Clinical Social Worker

## 2017-09-08 ENCOUNTER — Other Ambulatory Visit (HOSPITAL_COMMUNITY): Payer: 59

## 2017-09-08 DIAGNOSIS — F332 Major depressive disorder, recurrent severe without psychotic features: Secondary | ICD-10-CM | POA: Diagnosis not present

## 2017-09-08 NOTE — Psych (Signed)
   [facility] BH PHP THERAPIST PROGRESS NOTE  [name] [id number]  Session Time: 9 -1  Participation Level: Active  Behavioral Response: CasualAlertDepressed  Type of Therapy: Group Therapy; Psychotherapy, Psychoeducation; Activity Therapy  Treatment Goals addressed: Coping  Interventions: CBT, DBT, Supportive and Reframing  Summary:  9:00 - 10:30 Clinician led check-in regarding current stressors and situation, and review of patient completed daily inventory. Clinician utilized active listening and empathetic response and validated patient emotions. Clinician facilitated processing group on pertinent issues.  10:30 - 12:00: Cln led discussion on "hard" feelings and group viewed TED talk "The Power of Vulnerability." 12:00 - 12:45: Reflection group: Patients encouraged to practice skills and interpersonal techniques or work on mindfulness and relaxation techniques. The importance of self-care and making skills part of a routine to increase usage when stressed.  12:45 - 1:50 Clinician art therapy activity on representing an emotions with which pt is currently struggling.  1:50 - 2:00 Clinician led check-out. Clinician assessed for immediate needs, medication compliance and efficacy, and safety concerns.  * Pt chose to leave group at 1 due to a conflicting medical appointment.   Suicidal/Homicidal: Nowithout intent/plan  Therapist Response: Lori Jensen is a 46 y.o. female who presents with depression and anxiety symptoms. Patient arrived within time allowed and reports she is feeling "emotional." Patient rates her mood at a 5.5 on a 1- 10 scale with 10 being great. Patient states continued crying spells and feeling as if she "cried all day yesterday." Patient engaged in activity and discussion. Patient shares difficulty with managing emotions and taking feelings as fact. Pt is able to recognize some mantras to use in these situations. Patient denies SI/HI/self-harm thoughts at end of  session.     Plan: Patient will continue in PHP and medication management. Pt will work on decreasing passive SI, depression, and anxiety symptoms while increasing distress tolerance and emotional regulation skills.   Diagnosis: Severe recurrent major depression without psychotic features ([place]) [F33.2]    1. Severe recurrent major depression without psychotic features [facility])     [name], LCSW 09/08/2017

## 2017-09-08 NOTE — Psych (Signed)
Behavioral Health Partial Program Assessment Note  Date: 09/08/2017 Name: Lori Jensen MRN: [id number]  Chief Complaint: depression to the point of not being able to leave the house without considerable effort  Subjective:Lori Jensen says she is sad, crying all the time for no apparent reason and just cannot get motivated   HPI:Lori Jensen has been depressed most of her life.  She was in PHP and IOP here earlier this year and felt better but for no apparent reason began getting depressed shortly after leaving.  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  She is not sure what is the best choice especially with her depression and is afraid she will choose one but because of the depression may not be able to keep in and will be without a job or benefits.  Her 2 children are aged 30 and early 37's and she sees herself being alone.  The relationship she had the last time she was here has ended though she still makes contact with the man.  That has hurt and angered her but she says she accepts it is over and it was not a good enough relationship because he never gave up the girlfriend he continued to live with while he was seeing her. She has no good support system.  She wants to maybe go back to school and try a completely new career.  Her depression has reached the point of passive suicidal thinking and thoughts like would the life insurance cover suicide which she tries to put out of her mind.  No energy, isolating, no interest, joy or pleasure, hates her self image with the weight gain, feeling some hopelessness, wants to sleep all the time and has trouble getting and staying asleep. Patient is a 46 y.o. Caucasian female presents with depression.  Patient was enrolled in partial psychiatric program on 09/08/17.  Primary complaints include: agitation, anxiety, depression worse,  difficulty sleeping, poor concentration, relationship difficulties, stressed at work and tearfulness.  Onset of symptoms was gradual with rapidly worsening course since that time. Psychosocial Stressors include the following: relationship and job.   I have reviewed the previous records in [place] and the updated history as presented by the patient Complaints of Pain: none Past Psychiatric History:  Did not follow up with therapy or the psychiatrist after last stay  Currently in treatment with her PCP.  Substance Abuse History: none Use of Alcohol: denied Use of Caffeine: other not an issue Use of over the counter: not an issue  Past Surgical History:  Procedure Laterality Date  . DILATION AND CURETTAGE OF UTERUS    . DILATION AND CURETTAGE OF UTERUS  01/16/2013   Procedure: DILATATION AND CURETTAGE;  Surgeon: Gus Height, MD;  Location: [facility];  Service: Gynecology;;  . ENDOMETRIAL ABLATION    . uterine ablation  08/13/2011    Past Medical History:  Diagnosis Date  . Anxiety   . Depression   . Dysmenorrhea   . Heart murmur   . Hypertension   . PONV (postoperative nausea and vomiting)    Outpatient Encounter Medications as of 09/08/2017  Medication Sig Note  . ALBUTEROL IN Inhale 2 puffs into the lungs every 6 (six) hours as needed (wheezing).    . ALPRAZolam (XANAX) 0.25 MG tablet Take 0.25 mg by mouth daily as needed. For anxiety 01/26/2013: Very rarely used  . bisacodyl (  BISACODYL) 5 MG EC tablet Take 1 tablet (5 mg total) by mouth daily as needed for moderate constipation.   Marland Kitchen buPROPion (WELLBUTRIN XL) 150 MG 24 hr tablet Take 3 capsules daily   . busPIRone (BUSPAR) 10 MG tablet Take 10 mg by mouth 3 (three) times daily.   . DULoxetine (CYMBALTA) 30 MG capsule Take 30 mg by mouth daily.   . hydrochlorothiazide (HYDRODIURIL) 25 MG tablet Take 25 mg by mouth daily.    Marland Kitchen HYDROcodone-acetaminophen (NORCO/VICODIN) 5-325 MG tablet Take 1-2 tablets by mouth every 6 (six) hours as  needed.   Marland Kitchen ibuprofen (ADVIL,MOTRIN) 600 MG tablet Take 1 tablet (600 mg total) by mouth every 6 (six) hours as needed.   Marland Kitchen lisinopril (PRINIVIL,ZESTRIL) 10 MG tablet Take 10 mg by mouth daily.   . meloxicam (MOBIC) 15 MG tablet Take 1 tablet (15 mg total) by mouth daily.   . methocarbamol (ROBAXIN) 500 MG tablet Take 1 tablet (500 mg total) by mouth 2 (two) times daily.   . naproxen (NAPROSYN) 500 MG tablet Take 1 tablet (500 mg total) by mouth 2 (two) times daily.    No facility-administered encounter medications on file as of 09/08/2017.    Allergies  Allergen Reactions  . Codeine Anaphylaxis  . Amoxicillin Rash    Has patient had a PCN reaction causing immediate rash, facial/tongue/throat swelling, SOB or lightheadedness with hypotension: no Has patient had a PCN reaction causing severe rash involving mucus membranes or skin necrosis: no Has patient had a PCN reaction that required hospitalization no Has patient had a PCN reaction occurring within the last 10 years: yes If all of the above answers are "NO", then may proceed with Cephalosporin use.   Marland Kitchen Doxycycline Rash    Social History   Tobacco Use  . Smoking status: Former Smoker    Packs/day: 0.50    Years: 20.00    Pack years: 10.00    Types: Cigarettes    Last attempt to quit: 08/13/2016    Years since quitting: 1.0  . Smokeless tobacco: Never Used  Substance Use Topics  . Alcohol use: Yes    Comment: occasionally   Functioning Relationships: good relationship with children Education: High School: graduated Other Pertinent History: None Family History  Problem Relation Age of Onset  . Heart disease Mother   . Anxiety disorder Mother   . Depression Mother   . Stomach cancer Father   . Heart disease Father   . Other Father        non hodgkins lymphoma  . Alcohol abuse Maternal Aunt   . Drug abuse Maternal Aunt   . Colon cancer Neg Hx      Review of Systems Constitutional: negative Eyes: negative Ears,  nose, mouth, throat, and face: negative Respiratory: negative Cardiovascular: negative Gastrointestinal: negative Genitourinary: negative Integument/breast: negative Hematologic/lymphatic: negative Musculoskeletal: negative Neurological: negative Behavioral/Psych: depression and anxiety Endocrine: negative Allergic/Immunologic: negative  Objective:  There were no vitals filed for this visit.  Physical Exam: No exam performed today, no exam necessary.  Mental Status Exam: Appearance:  Well groomed Psychomotor::  Within Normal Limits Attention span and concentration: Normal Behavior: tearful Speech:  normal pitch and normal volume Mood:  depressed Affect:  normal and mood-congruent Thought Process:  Coherent and Goal Directed Thought Content:  Logical Orientation:  person, place and time/date Cognition:  grossly intact Insight:  Intact Judgment:  Intact Estimate of Intelligence: Above average Fund of knowledge: Intact Memory: Recent and remote intact Abnormal movements: None  Gait and station: Normal  Assessment:  Diagnosis:  Severe major depression recurrent without psychosis  Indications for admission: inpatient care required if not in partial hospital program  Plan: patient enrolled in Partial Hospitalization Program  Treatment options and alternatives reviewed with patient and patient understands the above plan.   Comments: only taking 30 mg of Cymbalta and that will likely be increased.  Buspirone at 10 mg tid may be increased.  Alprazolam at 0.25 mg daily will likely be left the same.  buproprion XL 150 mg could also be increased but she will likely be taking a weight loss medication containing buproprion and that could be the increase. Donnelly Angelica, MD

## 2017-09-09 ENCOUNTER — Other Ambulatory Visit (HOSPITAL_COMMUNITY): Payer: 59

## 2017-09-09 ENCOUNTER — Encounter (HOSPITAL_COMMUNITY): Payer: Self-pay | Admitting: Professional

## 2017-09-09 ENCOUNTER — Other Ambulatory Visit (HOSPITAL_COMMUNITY): Payer: 59 | Admitting: Professional

## 2017-09-09 VITALS — BP 116/76 | HR 86 | Ht 66.5 in | Wt 220.0 lb

## 2017-09-09 DIAGNOSIS — F332 Major depressive disorder, recurrent severe without psychotic features: Secondary | ICD-10-CM | POA: Diagnosis not present

## 2017-09-09 NOTE — Progress Notes (Signed)
Met with patient today as she presented with appropriate affect, depressed mood and discussed why she reached out to start Partial Hospitalization Program.  Patient reported she had started isolating more, was having some suicidal ideations without plans or intent, but knew she needed to get some help.  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  Patient reviewed her current medications with this nurse and reported at times she has been impulsive with spending online in the past and nights of not sleeping with some racing thoughts.  Patient stated she did not think this was mania but did feel some elevated mood changes along with periods of deep depression.  Patient reported plans to discuss this more with Dr. Lovena Le while in [facility] and reported at present medications working okay but still depressed and thought some changes may be necessary.  Patient denied any other symptoms at this time, no suicidal or homicidal ideations and no auditory or visual hallucinations.  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  Discussed with patient how she views job ending as she admits appropriate apprehension for need to change after many years but also sees this a something that may be best long-term.  Patient stable today and will discuss medications more with Dr. Lovena Le in the coming week.  Reports doing fince currently with medication regimen but is interested in discussing further with him upon his return to work in the next week.  Patient to call PHP staff if any problems prior to then and no other issues or concerns reported at this time.  Patient lives with 37 year old son and reports he is a  good support.

## 2017-09-09 NOTE — Psych (Signed)
   [facility] BH PHP THERAPIST PROGRESS NOTE  GAYLON MELCHOR [id number]  Session Time: 9 -1  Participation Level: Active  Behavioral Response: CasualAlertDepressed  Type of Therapy: Group Therapy; Psychotherapy, Psychoeducation; Activity Therapy, Safety Group  Treatment Goals addressed: Coping  Interventions: CBT, DBT, Supportive and Reframing  Summary:  9:00 - 10:30: Clinician led check-in regarding current stressors and situation, and review of patient completed daily inventory. Clinician utilized active listening and empathetic response and validated patient emotions. Clinician facilitated processing group on pertinent issues.  10:30 -12:00: Holiday Safety Group: A local police officer came in to speak to the group about safety.  12:00-12:50: Clinician led group discussion on guilt, why we feel guilt, and how we can manage those thoughts and feelings. 12:50 - 1:00: Clinician led check-out. Clinician assessed for immediate needs, medication compliance and efficacy, and safety concerns.   Suicidal/Homicidal: Nowithout intent/plan  Therapist Response: UNITY LUEPKE is a 46 y.o. female who presents with depression and anxiety symptoms. Patient arrived within time allowed and reports she is feeling "very anxious." Patient rates her mood at a 4 on a 1- 10 scale with 10 being great. Patient states she enjoyed the safety discussion with the police officer and learned things she did not know about safety precautions. Patient states she was anxious this morning but felt better at the end of group. Pt reports she is struggling with guilt about her depression/anxiety and how it is affecting her relationship with her children. Patient engaged in activity and discussion. Patient demonstrates some progress as evidenced by being more aware of the thoughts related to guilt and checking the facts about these thoughts. Patient denies SI/HI/self-harm thoughts at end of session.    Plan: Patient will  continue in PHP and medication management. Pt will work on decreasing passive SI, depression, and anxiety symptoms while increasing distress tolerance and emotional regulation skills.   Diagnosis: Severe recurrent major depression without psychotic features ([place]) [F33.2]    1. Severe recurrent major depression without psychotic features ([place])     Royetta Crochet, LPCA 09/09/2017

## 2017-09-12 ENCOUNTER — Other Ambulatory Visit (HOSPITAL_COMMUNITY): Payer: Self-pay

## 2017-09-12 ENCOUNTER — Other Ambulatory Visit (HOSPITAL_COMMUNITY): Payer: 59

## 2017-09-13 ENCOUNTER — Other Ambulatory Visit (HOSPITAL_COMMUNITY): Payer: 59

## 2017-09-13 ENCOUNTER — Other Ambulatory Visit (HOSPITAL_COMMUNITY): Payer: 59 | Admitting: Licensed Clinical Social Worker

## 2017-09-13 ENCOUNTER — Other Ambulatory Visit (HOSPITAL_COMMUNITY): Payer: 59 | Admitting: Occupational Therapy

## 2017-09-13 DIAGNOSIS — F332 Major depressive disorder, recurrent severe without psychotic features: Secondary | ICD-10-CM | POA: Diagnosis not present

## 2017-09-13 DIAGNOSIS — R4589 Other symptoms and signs involving emotional state: Secondary | ICD-10-CM

## 2017-09-13 DIAGNOSIS — F331 Major depressive disorder, recurrent, moderate: Secondary | ICD-10-CM

## 2017-09-13 MED ORDER — BUPROPION HCL ER (XL) 150 MG PO TB24: ORAL_TABLET | ORAL | 1 refills | Status: DC

## 2017-09-13 MED ORDER — CLONAZEPAM 1 MG PO TABS: 1.0000 mg | ORAL_TABLET | Freq: Three times a day (TID) | ORAL | 0 refills | Status: DC | PRN

## 2017-09-13 MED ORDER — DULOXETINE HCL 30 MG PO CPEP: 30.0000 mg | ORAL_CAPSULE | Freq: Every day | ORAL | 1 refills | Status: DC
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
## 2017-09-13 NOTE — Therapy (Signed)
[place] [employer], [place], [address] Phone: [phone]   Fax:  [phone]  Occupational Therapy Evaluation and Treatment  Patient Details  Name: Lori Jensen MRN: [id number] Date of Birth: August 24, 1971 Referring Provider (Historical): Dr. Neita Garnet   Encounter Date: 09/13/2017  OT End of Session - 09/13/17 1715    Visit Number  1    Number of Visits  6    Date for OT Re-Evaluation  10/07/17    Authorization Type  [employer]    OT Start Time  1030    OT Stop Time  1130    OT Time Calculation (min)  60 min    Activity Tolerance  Patient tolerated treatment well    Behavior During Therapy  [facility] for tasks assessed/performed       Past Medical History:  Diagnosis Date  . Anxiety   . Depression   . Dysmenorrhea   . Heart murmur   . Hypertension   . PONV (postoperative nausea and vomiting)     Past Surgical History:  Procedure Laterality Date  . DILATION AND CURETTAGE OF UTERUS    . DILATION AND CURETTAGE OF UTERUS  01/16/2013   Procedure: DILATATION AND CURETTAGE;  Surgeon: Gus Height, MD;  Location: [facility];  Service: Gynecology;;  . ENDOMETRIAL ABLATION    . uterine ablation  08/13/2011    There were no vitals filed for this visit.  Subjective Assessment - 09/13/17 1715    Currently in Pain?  No/denies        [facility] OT Assessment - 09/13/17 1715      Assessment   Medical Diagnosis  MDD    Referring Provider  Dr. Donnelly Angelica    Onset Date/Surgical Date  -- chronic    Prior Therapy  OT in PHP in 01/2017      Precautions   Precautions  None      Balance Screen   Has the patient fallen in the past 6 months  No    Has the patient had a decrease in activity level because of a fear of falling?   No    Is the patient reluctant to leave their home because of a fear of falling?   No         OT assessment:  Diagnosis:  MDD Past medical history: anxiety, depression Living situation:  With 45 y/o son ADLs:  Independent Work:  [employer] is closing in 05/2018  Leisure: watching TV  Social support: none Struggles: anxiety, depression, pressure over work circumstances OT goal:  Physicist, medical Causality Orientation Scale    Subscore Percentile Score  Autonomy 49 36.12  Control 35 8.50  Impersonal 49 55.95    Motivation Type  Motivation type Explanation    Impersonal/amotivational  There is a lack of connection between any of the individual's behavior and his or her personal goals. The individual is likely in a passivity state or manifests non-goal-directed behavior. This is considered a type of motivational deficit and warrants motivational interventions.        Assessment:  Patient demonstrates impersonal/amotivational behaviors. This is considered a type of motivational deficit and warrants motivational interventions. Patient will benefit from occupational therapy intervention in order to improve time management, financial management, stress management, job readiness skills, social skills, sleep hygiene, exercise and healthy eating habits, and health management skills and other psychosocial skills needed for preparation to return to full  time community living and to be a productive community member.     Plan:  Patient will participate in skilled occupational therapy sessions individually or in a group setting to improve coping skills, psychosocial skills, and emotional skills required to return to prior level of function as a productive community member. Treatment will be 1-2 times per week for 2-6 weeks.      OT Group: Mindfulness and Protective Features   S:  "I have no social support because it makes me more anxious to talk about what's going on with me."   O:  Patient actively participated in the following skilled occupational therapy treatment session this date:             Mindfulness and Protective Features: Pt  participated in group focusing on identifying 6 protective features and incorporating active mindfulness. Pt participated in group discussion on what these areas entail and how they are defined. Pt completed worksheet identifying what protective features they currently utilize and if these are weak, moderate, or strong in strength and what features need to be improved upon. Pt then identified how improving these areas will improve their overall coping skills.    A:  Patient participated in skilled occupational therapy group for social and communication skills this date.  Patient was engaged during activities and provided insightful thoughts and feelings on session.      P:  Continue participation in skilled occupational therapy groups  1-2 times per week for 2 weeks in order to gain the necessary skills needed to return to full time community living and learn effective coping strategies to be a productive community resident. Next session: social and communication group based on assertiveness and self-esteem.     OT Short Term Goals - 09/13/17 1716      OT SHORT TERM GOAL #1   Title  Patient will be educated on strategies to improve psychosocial skills needed to participate fully in all daily, work, and leisure activities.    Time  3    Period  Weeks    Status  New    Target Date  10/07/17      OT SHORT TERM GOAL #2   Title  Patient will be educated on a HEP and independent with implementation of HEP.    Time  3    Period  Weeks    Status  New      OT SHORT TERM GOAL #3   Title  Patient will independently apply psychosocial skills and coping mechanisms to her daily activities in order to function independently.    Time  3    Period  Weeks    Status  New               Plan - 09/13/17 1715    Occupational performance deficits (Please refer to evaluation for details):  ADL's;IADL's;Rest and Sleep;Work;Leisure;Social Participation    Rehab Potential  Good    OT Frequency  2x /  week    OT Duration  -- 3 weeks    OT Treatment/Interventions  Self-care/ADL training;Patient/family education;Other (comment) coping skills training, psychosocial skills training, community reintegration    Clinical Decision Making  Limited treatment options, no task modification necessary    Consulted and Agree with Plan of Care  Patient       Patient will benefit from skilled therapeutic intervention in order to improve the following deficits and impairments:  Other (comment)(decreased coping skills; decreased psychosocial skills)  Visit Diagnosis: Severe recurrent major depression  without psychotic features ([place])  Difficulty coping    Problem List Patient Active Problem List   Diagnosis Date Noted  . SMOKER 07/23/2009  . PALPITATIONS 06/12/2009  . PANIC ATTACKS 11/24/2006  . DEPRESSIVE DISORDER, NOS 11/24/2006  . MITRAL VALVE PROLAPSE 11/24/2006  . CHEST PAIN 11/24/2006   Guadelupe Sabin, OTR/L  [phone] 09/13/2017, 5:17 PM  [facility] PARTIAL HOSPITALIZATION PROGRAM [place], [place], [address] Phone: [phone]   Fax:  [phone]  Name: JASPER RUMINSKI MRN: [id number] Date of Birth: [birth year]

## 2017-09-14 ENCOUNTER — Other Ambulatory Visit (HOSPITAL_COMMUNITY): Payer: 59 | Admitting: Licensed Clinical Social Worker

## 2017-09-14 ENCOUNTER — Other Ambulatory Visit (HOSPITAL_COMMUNITY): Payer: 59

## 2017-09-14 DIAGNOSIS — F332 Major depressive disorder, recurrent severe without psychotic features: Secondary | ICD-10-CM | POA: Diagnosis not present

## 2017-09-14 NOTE — Progress Notes (Signed)
Spiritual care group 09/14/2017 11:00-12:00  Facilitated by Simone Curia, MDiv   Group focused on topic of "self-care"  Patients engaged in facilitated discussion about topic.  Explored quotes related to self care and chose one which they agreed with and one which they disliked.  Engaged in discussion around quote choices and their experience / understanding of care for themselves.    Lori Jensen was present throughout group.  Engaged voluntarily with chaplain and group members in discussion.  Related that she expresses care for others freely, but has difficulty caring for self.  Reflected with group that she feels her value is tied up in how others perceive her and chose a quote about caring for oneself so that we may be able to care for others.     [facility] [place], MDiv

## 2017-09-14 NOTE — Psych (Signed)
   [facility] PHP THERAPIST PROGRESS NOTE  Lori Jensen [id number]  Session Time: 9am - 2 pm  Participation Level: Active  Behavioral Response: CasualAlertDepressed  Type of Therapy: Group Therapy; Psychotherapy, Psychoeducation; Activity Therapy, OT  Treatment Goals addressed: Coping  Interventions: CBT, DBT, Supportive and Reframing  Summary:  9:00 - 10:30 Clinician led check-in regarding current stressors and situation, and review of patient completed daily inventory. Clinician utilized active listening and empathetic response and validated patient emotions. Clinician facilitated processing group on pertinent issues.  10:30 -11:30: OT group  11:30-12:15: Clinician led discussion on self esteem; how it is not static and with work can improve.  12:15 - 1:00: Reflection group: Patients encouraged to practice skills and interpersonal techniques or work on mindfulness and relaxation techniques. The importance of self-care and making skills part of a routine to increase usage were stressed.  1:00 - 1:50 Clinician introduced topic of "Cognitive Distortions." Pts worked to identify common cognitive distortions they often experience and ways to combat those distortions.  1:50 - 2:00 Clinician led check-out. Clinician assessed for immediate needs, medication compliance and efficacy, and safety concerns.   Suicidal/Homicidal: Nowithout intent/plan  Therapist Response: Lori Jensen is a 46 y.o. female who presents with depression and anxiety symptoms. Patient arrived within time allowed and reports she is feeling "a new level of anxiety." Patient rates her mood at a 3 on a 1- 10 scale with 10 being great. Patient states continued high levels of anxiety and stating that she has felt "anxious, agitated, and angry" throughout the weekend and struggled to cope, yet found working with a hammer helped. Patient engaged in activity and discussion. Patient was able to identify distortions that she  frequently uses, particularly mind reading and minimizing. Pt demonstrates some progress as evidenced by attempting to utilize coping skills last night. Patient denies SI/HI/self-harm thoughts at end of session.     Plan: Patient will continue in PHP and medication management. Pt will work on decreasing passive SI, depression, and anxiety symptoms while increasing distress tolerance and emotional regulation skills.   Diagnosis: Moderate episode of recurrent major depressive disorder (HCC) [F33.1]    1. Moderate episode of recurrent major depressive disorder ([place])     [name], LCSW 09/14/2017

## 2017-09-15 ENCOUNTER — Other Ambulatory Visit (HOSPITAL_COMMUNITY): Payer: 59 | Admitting: Licensed Clinical Social Worker

## 2017-09-15 ENCOUNTER — Other Ambulatory Visit (HOSPITAL_COMMUNITY): Payer: 59

## 2017-09-15 ENCOUNTER — Other Ambulatory Visit (HOSPITAL_COMMUNITY): Payer: 59 | Admitting: Specialist

## 2017-09-15 DIAGNOSIS — F332 Major depressive disorder, recurrent severe without psychotic features: Secondary | ICD-10-CM

## 2017-09-15 DIAGNOSIS — R4589 Other symptoms and signs involving emotional state: Secondary | ICD-10-CM

## 2017-09-16 ENCOUNTER — Other Ambulatory Visit (HOSPITAL_COMMUNITY): Payer: 59 | Admitting: Licensed Clinical Social Worker

## 2017-09-16 ENCOUNTER — Encounter (HOSPITAL_COMMUNITY): Payer: Self-pay

## 2017-09-16 ENCOUNTER — Other Ambulatory Visit (HOSPITAL_COMMUNITY): Payer: 59

## 2017-09-16 VITALS — BP 126/82 | HR 79 | Ht 66.0 in | Wt 220.0 lb

## 2017-09-16 DIAGNOSIS — F332 Major depressive disorder, recurrent severe without psychotic features: Secondary | ICD-10-CM

## 2017-09-16 NOTE — Progress Notes (Signed)
Patient presents with appropriate affect, level mood and admitted she is a little nervous about starting and trying Klonopin medication but will pick up today and try this weekend.  States she is optimistic it will help better when she is really anxious.  Patient scored her current level of depression a 4, anxiety a 5 and hopelessness a 6 on a scale of 0-10 with 0 being none and 10 the worst she could manage.  Patient denies any suicidal or homicidal ideations and reports plans to return to work soon and discussed how she is planning for this.  Patient stable at this time and will return in the coming week.

## 2017-09-16 NOTE — Therapy (Signed)
[place], [place], [address] Phone: [phone]   Fax:  [phone]  Occupational Therapy Treatment  Patient Details  Name: Lori Jensen MRN: [id number] Date of Birth: 05/12/1971 Referring Provider (Historical): Dr. Neita Garnet   Encounter Date: 09/15/2017  OT End of Session - 09/16/17 0833    Visit Number  2    Number of Visits  6    Date for OT Re-Evaluation  10/07/17    Authorization Type  [employer]    OT Start Time  1030    OT Stop Time  1130    OT Time Calculation (min)  60 min    Activity Tolerance  Patient tolerated treatment well       Past Medical History:  Diagnosis Date  . Anxiety   . Depression   . Dysmenorrhea   . Heart murmur   . Hypertension   . PONV (postoperative nausea and vomiting)     Past Surgical History:  Procedure Laterality Date  . DILATION AND CURETTAGE OF UTERUS    . DILATION AND CURETTAGE OF UTERUS  01/16/2013   Procedure: DILATATION AND CURETTAGE;  Surgeon: Gus Height, MD;  Location: [facility];  Service: Gynecology;;  . ENDOMETRIAL ABLATION    . uterine ablation  08/13/2011    There were no vitals filed for this visit.  Subjective Assessment - 09/16/17 0832    Currently in Pain?  No/denies         S:  I think past experiences are what cause my low self-esteem.  I tend to isolate a lot. O:  Patient participated in skilled occupational therapy group this day focusing on improving self-esteem.  At opening of group, members shared one of their proudest accomplishments, as well as the state of their current self-esteem.  Group was educated on definition of self-esteem, high and low self-esteem, Causes of low self-esteem, including past experiences, social media, isolation, health, other peoples viewpoints, relationships.  Group discussed the close knit relationship of low self-esteem and mental health problems.  Group was then educated on ways to improve  self-esteem, including doing tasks that provide enjoyment, work, hobbies, involvement in positive relationships, assertiveness, physical activity, sleep, diet, challenging self, positive thinking.  Group also acknowledged that we must determine the root cause of our negative beliefs and challenge those beliefs as a first step towards improving self-esteem.  Other tools the group learned were focusing on the positive, fact checking, and learning self-compassion.  Group concluded with patient sharing one new strategy they will implement to build their self-esteem.   A:  Patient was engaged in group this date.  Patient shared that past experiences with family and fear of her job changing are causing low self-esteem.  Lori Jensen will focus on reaching out and building positive relationships as a new self-esteem building strategy. P:  Continue skilled OT group 2 times per week for 3 weeks, next group session will focus on improving assertiveness for improved communication, self-esteem, and independence.                   OT Education - 09/16/17 [phone]    Education provided  Yes    Education Details  Patient was educated on causes of low self-esteem and strategies to improve self-esteem    Person(s) Educated  Patient    Methods  Explanation;Handout    Comprehension  Verbalized understanding       OT Short Term Goals - 09/16/17 [phone]  OT SHORT TERM GOAL #1   Title  Patient will be educated on strategies to improve psychosocial skills needed to participate fully in all daily, work, and leisure activities.    Time  3    Period  Weeks    Status  On-going      OT SHORT TERM GOAL #2   Title  Patient will be educated on a HEP and independent with implementation of HEP.    Time  3    Period  Weeks    Status  On-going      OT SHORT TERM GOAL #3   Title  Patient will independently apply psychosocial skills and coping mechanisms to her daily activities in order to function independently.     Time  3    Period  Weeks    Status  On-going               Plan - 09/16/17 6789    Occupational performance deficits (Please refer to evaluation for details):  ADL's;IADL's;Rest and Sleep;Work;Leisure;Social Participation    Rehab Potential  Good    OT Treatment/Interventions  Self-care/ADL training;Patient/family education;Other (comment) coping skills, pyschosocial skills training, community reintegration       Patient will benefit from skilled therapeutic intervention in order to improve the following deficits and impairments:  (decreased coping skills, decreased psychosocial skills)  Visit Diagnosis: Severe recurrent major depression without psychotic features ([place])  Difficulty coping    Problem List Patient Active Problem List   Diagnosis Date Noted  . SMOKER 07/23/2009  . PALPITATIONS 06/12/2009  . PANIC ATTACKS 11/24/2006  . DEPRESSIVE DISORDER, NOS 11/24/2006  . MITRAL VALVE PROLAPSE 11/24/2006  . CHEST PAIN 11/24/2006    Vangie Bicker, [place], OTR/L [id number]  09/16/2017, 8:35 AM  [place], [place], [address] Phone: [phone]   Fax:  [phone]  Name: Lori Jensen MRN: [id number] Date of Birth: Dec 27, 1970

## 2017-09-16 NOTE — Psych (Signed)
   [facility] PHP THERAPIST PROGRESS NOTE  Lori Jensen [id number]  Session Time: 9am - 2 pm  Participation Level: Active  Behavioral Response: CasualAlertDepressed  Type of Therapy: Group Therapy; Psychotherapy, Spiritual Care Therapy, Relaxation Therapy  Treatment Goals addressed: Coping  Interventions: CBT, DBT, Supportive and Reframing  Summary:  9:00 - 10:30 Clinician led check-in regarding current stressors and situation, and review of patient completed daily inventory. Clinician utilized active listening and empathetic response and validated patient emotions. Clinician facilitated processing group on pertinent issues.  10:30 -12:00 Spiritual care group  12:00 - 12:45 Reflection group: Patients encouraged to practice skills and interpersonal techniques or work on mindfulness and relaxation techniques. The importance of self-care and making skills part of a routine to increase usage were stressed.  12:45 - 1:50 Relaxation group: Cln Jan Fireman led yoga group focused on retraining the body's response to stress.  1:50 - 2:00 Clinician led check-out. Clinician assessed for immediate needs, medication compliance and efficacy, and safety concerns.   Suicidal/Homicidal: Nowithout intent/plan  Therapist Response: Lori Jensen is a 46 y.o. female who presents with depression and anxiety symptoms. Patient arrived within time allowed and reports she is feeling "tired and struggling with anxiety." Patient rates her mood at a 5 on a 1- 10 scale with 10 being great. Patient states she was not productive the prior afternoon and feels "bad" about it now. Pt reports she felt like she was on the verge of a panic attack all day yesterday.  Pt reports her mind was racing.  Pt states she did not pick up her new meds. Patient engaged in activity and discussion. Pt demonstrates limited progress as evidenced by not using skills when in a heightened state of anxiety. Patient denies SI/HI/self-harm  thoughts at end of session.  Plan: Patient will continue in PHP and medication management. Pt will work on decreasing passive SI, depression, and anxiety symptoms while increasing distress tolerance and emotional regulation skills.   Diagnosis: Severe recurrent major depression without psychotic features ([place]) [F33.2]    1. Severe recurrent major depression without psychotic features ([place])     Royetta Crochet, LPCA 09/16/2017

## 2017-09-16 NOTE — Psych (Signed)
   [facility] BH PHP THERAPIST PROGRESS NOTE  HONESTY MENTA [id number]  Session Time: 9am - 2 pm  Participation Level: Active  Behavioral Response: CasualAlertDepressed  Type of Therapy: Group Therapy; Psychotherapy, Psychoeducation, Occupational Therapy, Activity Therapy  Treatment Goals addressed: Coping  Interventions: CBT, DBT, Supportive and Reframing  Summary:  9:00 - 10:30 Clinician led check-in regarding current stressors and situation, and review of patient completed daily inventory. Clinician utilized active listening and empathetic response and validated patient emotions. Clinician facilitated processing group on pertinent issues.  10:30 -11:30: OT Group  11:30-12:15: Clinician led psychoeducation group on distress tolerance. The STOP skill was introduced, and patients discussed how to utilize it. Patients identified when this technique may be helpful in their personal lives.  12:15 - 1:00: Reflection group: Patients encouraged to practice skills and interpersonal techniques or work on mindfulness and relaxation techniques. The importance of self-care and making skills part of a routine to increase usage were stressed.  1:00 - 1:50 Clinician continued topic of "Distress Tolerance". Group discussed "TIPS" and how/when patients can employ this method to help. Patients identified when this technique may be helpful in their personal lives.  1:50 - 2:00 Clinician led check-out. Clinician assessed for immediate needs, medication compliance and efficacy, and safety concerns.    Suicidal/Homicidal: Nowithout intent/plan  Therapist Response: Lori Jensen is a 46 y.o. female who presents with depression and anxiety symptoms. Patient arrived within time allowed and reports she is feeling "OK." Patient rates her mood at a 5.5 on a 1- 10 scale with 10 being great. Patient shares yesterday wasn't great, but she was able to accomplish some things that needed to be done. Pt reports she used  some skills, like reaching out to daughter, to help her.  Pt states she continues to be stressed about finances. Patient engaged in activity and discussion. Pt demonstrates some progress as evidenced by using skills to help manage symptoms. Patient denies SI/HI/self-harm thoughts at end of session.  Plan: Patient will continue in PHP and medication management. Pt will work on decreasing passive SI, depression, and anxiety symptoms while increasing distress tolerance and emotional regulation skills.   Diagnosis: Severe recurrent major depression without psychotic features ([place]) [F33.2]    1. Severe recurrent major depression without psychotic features ([place])     Royetta Crochet, LPCA 09/16/2017

## 2017-09-16 NOTE — Psych (Signed)
   [facility] BH PHP THERAPIST PROGRESS NOTE  BRADEN DELOACH [id number]  Session Time: 9am - 1 pm  Participation Level: Active  Behavioral Response: CasualAlertDepressed  Type of Therapy: Group Therapy; Psychotherapy, Psychoeducation  Treatment Goals addressed: Coping  Interventions: CBT, DBT, Supportive and Reframing  Summary:  9:00 - 10:30 Clinician led check-in regarding current stressors and situation, and review of patient completed daily inventory. Clinician utilized active listening and empathetic response and validated patient emotions. Clinician facilitated processing group on pertinent issues.  10:30 -11:30: Clinician led psychotherapy group on topic of how stigma affects interpersonal relationships.  11:30-12:50: Clinician continued with topic of "Distress Tolerance". Clinician discussed "Accepts" skill and how/when patients can employ this method to help. Patients identified when these techniques may be helpful in their personal lives.  12:50 - 1:00 Clinician led check-out. Clinician assessed for immediate needs, medication compliance and efficacy, and safety concerns.   Suicidal/Homicidal: Nowithout intent/plan  Therapist Response: Lori Jensen is a 46 y.o. female who presents with depression and anxiety symptoms. Patient arrived within time allowed and reports she is feeling "worried about crashing." Patient rates her mood at a 8 on a 1- 10 scale with 10 being great. Patient shares she felt some relief due to getting things done yesterday that she felt she needed to do.  Pt reports she used STOP and PMR skill to help her manage symptoms.  Patient engaged in activity and discussion. Pt demonstrates some progress as evidenced by using PMR and STOP skill to help manage symptoms. Patient denies SI/HI/self-harm thoughts at end of session.  Plan: Patient will continue in PHP and medication management. Pt will work on decreasing passive SI, depression, and anxiety symptoms while  increasing distress tolerance and emotional regulation skills.   Diagnosis: Severe recurrent major depression without psychotic features ([place]) [F33.2]    1. Severe recurrent major depression without psychotic features ([place])     Royetta Crochet, LPCA 09/16/2017

## 2017-09-19 ENCOUNTER — Other Ambulatory Visit (HOSPITAL_COMMUNITY): Payer: Self-pay

## 2017-09-19 ENCOUNTER — Other Ambulatory Visit (HOSPITAL_COMMUNITY): Payer: 59

## 2017-09-20 ENCOUNTER — Other Ambulatory Visit (HOSPITAL_COMMUNITY): Payer: Self-pay

## 2017-09-20 ENCOUNTER — Ambulatory Visit (HOSPITAL_COMMUNITY): Payer: Self-pay

## 2017-09-21 ENCOUNTER — Other Ambulatory Visit (HOSPITAL_COMMUNITY): Payer: 59

## 2017-09-21 ENCOUNTER — Other Ambulatory Visit (HOSPITAL_COMMUNITY): Payer: Self-pay

## 2017-09-21 DIAGNOSIS — R221 Localized swelling, mass and lump, neck: Secondary | ICD-10-CM | POA: Insufficient documentation

## 2017-09-22 ENCOUNTER — Other Ambulatory Visit (HOSPITAL_COMMUNITY): Payer: 59

## 2017-09-22 ENCOUNTER — Other Ambulatory Visit (HOSPITAL_COMMUNITY): Payer: 59 | Admitting: Occupational Therapy

## 2017-09-22 ENCOUNTER — Other Ambulatory Visit: Payer: Self-pay | Admitting: Family Medicine

## 2017-09-22 ENCOUNTER — Other Ambulatory Visit (HOSPITAL_COMMUNITY): Payer: 59 | Admitting: Licensed Clinical Social Worker

## 2017-09-22 ENCOUNTER — Encounter (HOSPITAL_COMMUNITY): Payer: Self-pay | Admitting: Psychiatry

## 2017-09-22 VITALS — BP 122/76 | HR 83 | Ht 66.0 in | Wt 221.0 lb

## 2017-09-22 DIAGNOSIS — F332 Major depressive disorder, recurrent severe without psychotic features: Secondary | ICD-10-CM

## 2017-09-22 DIAGNOSIS — R4589 Other symptoms and signs involving emotional state: Secondary | ICD-10-CM

## 2017-09-22 DIAGNOSIS — Z139 Encounter for screening, unspecified: Secondary | ICD-10-CM

## 2017-09-22 NOTE — Progress Notes (Signed)
Patient presented with appropriate affect, still depressed mood but denies any thoughts of wanting to harm self of others and no other symptoms at this time.  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  Patient reported her current level of depression a 6, anxiety an 8 and hopelessness a 4-5 on a scale of 0-10 with 0 being none and 10 the worst she could experience.  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  Patient stated she did have one panic attack over the past week and used a whole Klonopin for the first time.  States the medication helped after about 15 minutes but later she did go to sleep and slept a long time.  States plan to try a 1/2 pill the next time if this occurs and only takes when needed.  Discussed use of learned coping skills to help during these times too and discussed options for continued group support once she finishes PHP as she states she feels she does better in group settings than individual.  Patient will discuss further with Dr. Lovena Le and [facility] staff when she gets closer to discharging from the service and reports overall she is doing much better and is very happy with what she has learned in the program.  Patient to contact this nurse as needed and if any worsening of symptoms.  Denies any side effects or current problems with medications.

## 2017-09-22 NOTE — Therapy (Signed)
[place], [place], [address] Phone: [phone]   Fax:  [phone]  Occupational Therapy Treatment  Patient Details  Name: Lori Jensen MRN: [id number] Date of Birth: [birth year] Referring Provider (Historical): Dr. Neita Garnet   Encounter Date: 09/22/2017  OT End of Session - 09/22/17 1304    Visit Number  3    Number of Visits  6    Date for OT Re-Evaluation  10/07/17    Authorization Type  [employer]    OT Start Time  1035    OT Stop Time  1140    OT Time Calculation (min)  65 min    Activity Tolerance  Patient tolerated treatment well       Past Medical History:  Diagnosis Date  . Anxiety   . Depression   . Dysmenorrhea   . Heart murmur   . Hypertension   . PONV (postoperative nausea and vomiting)     Past Surgical History:  Procedure Laterality Date  . DILATION AND CURETTAGE OF UTERUS    . DILATION AND CURETTAGE OF UTERUS  01/16/2013   Procedure: DILATATION AND CURETTAGE;  Surgeon: Gus Height, MD;  Location: [facility];  Service: Gynecology;;  . ENDOMETRIAL ABLATION    . uterine ablation  08/13/2011    There were no vitals filed for this visit.  Subjective Assessment - 09/22/17 1304    Currently in Pain?  No/denies         [facility] OT Assessment - 09/22/17 1304      Assessment   Medical Diagnosis  MDD      Precautions   Precautions  None          OT Group: Communication Skills-Active Listening and Open Discussion   S:  "Assertive people are confident people."   O:  Patient actively participated in the following skilled occupational therapy treatment session this date:             Social and communication skills: Pt participated in group geared towards assertiveness practice and appropriate communication skills/behavior. Pt engaged in discussion on body language and what is portrayed via various gestures and postures. Pt participated in group discussion of  assertiveness and traits of passive, assertive, and aggressive individuals; then participated in group activity of role playing each of the 3 aforementioned traits while trying to sell a new cell phone to an assertive/confident person. Group activity focusing on active listening and open communication via dialogue was completed next. Pt participated in small group/pair activity-participants given a subject to discuss. One person talks about the subject for 3 minutes while the other person practices active listening; then the listener recapped what the speaker said but did not include opinions or personal thoughts. Pairs/groups then switch roles for the next subject. At end of small group activity all participants reconvene for large group discussion on a potentially controversial topic. Today's topic was social media and whether it is good or bad. Pt was actively engaged during both small and large group activities and contributed to the discussion.    A:  Patient participated in skilled occupational therapy group for social and communication skills this date.  Patient was engaged during activities and provided insightful thoughts and feelings on session.      P:  Continue participation in skilled occupational therapy groups  1-2 times per week for 2 weeks in order to gain the necessary skills needed to return to full time community living and learn  effective coping strategies to be a productive community resident. Next session: Stress management.        OT Short Term Goals - 09/16/17 0834      OT SHORT TERM GOAL #1   Title  Patient will be educated on strategies to improve psychosocial skills needed to participate fully in all daily, work, and leisure activities.    Time  3    Period  Weeks    Status  On-going      OT SHORT TERM GOAL #2   Title  Patient will be educated on a HEP and independent with implementation of HEP.    Time  3    Period  Weeks    Status  On-going      OT SHORT TERM  GOAL #3   Title  Patient will independently apply psychosocial skills and coping mechanisms to her daily activities in order to function independently.    Time  3    Period  Weeks    Status  On-going               Plan - 09/22/17 1305    Occupational performance deficits (Please refer to evaluation for details):  ADL's;IADL's;Rest and Sleep;Work;Leisure;Social Participation    Rehab Potential  Good    OT Treatment/Interventions  Self-care/ADL training;Patient/family education;Other (comment) coping skills, pyschosocial skills training, community reintegration       Patient will benefit from skilled therapeutic intervention in order to improve the following deficits and impairments:  (decreased coping skills, decreased psychosocial skills)  Visit Diagnosis: Severe recurrent major depression without psychotic features ([place])  Difficulty coping    Problem List Patient Active Problem List   Diagnosis Date Noted  . SMOKER 07/23/2009  . PALPITATIONS 06/12/2009  . PANIC ATTACKS 11/24/2006  . DEPRESSIVE DISORDER, NOS 11/24/2006  . MITRAL VALVE PROLAPSE 11/24/2006  . CHEST PAIN 11/24/2006   Guadelupe Sabin, OTR/L  [phone] 09/22/2017, 1:05 PM  [facility] PARTIAL HOSPITALIZATION PROGRAM [place], [place], [address] Phone: [phone]   Fax:  [phone]  Name: Lori Jensen MRN: [id number] Date of Birth: 27-Jun-1971

## 2017-09-22 NOTE — Progress Notes (Signed)
Progress note  Lori Jensen has had no problems with her medications.  She had a good holiday break, managing the extended family well enough and good time with her children. Plan: no changes

## 2017-09-22 NOTE — Psych (Signed)
   [facility] BH PHP THERAPIST PROGRESS NOTE  Lori Jensen [id number]  Session Time: 9am - 2 pm  Participation Level: Active  Behavioral Response: CasualAlertDepressed  Type of Therapy: Group Therapy; Psychotherapy, Psychoeducation, Activity Therapy, Occupational Therapy  Treatment Goals addressed: Coping  Interventions: CBT, DBT, Supportive and Reframing  Summary:  9:00 - 10:30 Clinician led check-in regarding current stressors and situation, and review of patient completed daily inventory. Clinician utilized active listening and empathetic response and validated patient emotions. Clinician facilitated processing group on pertinent issues.  10:30 -11:30: OT Group  11:30 - 12:15: Cln provided psychoeducation on the 4 communication styles. Group identified their personal style and worked on recognizing the presentation of each communication style.  12:15 - 1:00 Reflection group: Patients encouraged to practice skills and interpersonal techniques or work on mindfulness and relaxation techniques. The importance of self-care and making skills part of a routine to increase usage were stressed.  1:50- 1:50: Clinician introduced topic of "Communication - I Statements". Group discussed "I-Statements" and how using these instead of "you-statements" could be beneficial in their lives.  1:50 - 2:00 Clinician led check-out. Clinician assessed for immediate needs, medication compliance and efficacy, and safety concerns.    Suicidal/Homicidal: Nowithout intent/plan  Therapist Response: [name] is a 46 y.o. female who presents with depression and anxiety symptoms. Patient arrived within time allowed and reports she is feeling "really depressed and not good." Patient rates her mood at a 4.5 on a 1- 10 scale with 10 being great. Patient shares [name] was bad due to being around family she didn't want to see but good because she got to spend time with her kids. Pt reports she is feeling guilt  related to her relationship with her mother. Patient engaged in activity and discussion. Pt demonstrates some progress as evidenced by recognition of boundaries she wants to work on with her mother. Patient denies SI/HI/self-harm thoughts at end of session.  Plan: Patient will continue in PHP and medication management. Pt will work on decreasing passive SI, depression, and anxiety symptoms while increasing distress tolerance and emotional regulation skills.   Diagnosis: Severe recurrent major depression without psychotic features ([place]) [F33.2]    1. Severe recurrent major depression without psychotic features ([place])     Royetta Crochet, LPCA 09/22/2017

## 2017-09-23 ENCOUNTER — Other Ambulatory Visit (HOSPITAL_COMMUNITY): Payer: 59 | Admitting: Licensed Clinical Social Worker

## 2017-09-23 ENCOUNTER — Other Ambulatory Visit (HOSPITAL_COMMUNITY): Payer: 59

## 2017-09-23 DIAGNOSIS — F332 Major depressive disorder, recurrent severe without psychotic features: Secondary | ICD-10-CM

## 2017-09-23 NOTE — Psych (Signed)
   [facility] BH PHP THERAPIST PROGRESS NOTE  Lori Jensen [id number]  Session Time: 9am - 1 pm  Participation Level: Active  Behavioral Response: CasualAlertDepressed  Type of Therapy: Group Therapy; Psychotherapy, Psychoeducation, Activity Therapy  Treatment Goals addressed: Coping  Interventions: CBT, DBT, Supportive and Reframing  Summary:  9:00 - 10:30 Clinician led check-in regarding current stressors and situation, and review of patient completed daily inventory. Clinician utilized active listening and empathetic response and validated patient emotions. Clinician facilitated processing group on pertinent issues.  10:30 -11:15: Group did a distress tolerance workshop where the group practiced skills such as PMR.  11:15 - 12:00: Clinician continued with topic of "Communication - I Statements". Group practiced creating I-statements from situations sheet. Group discussed in what situations I-statements would be beneficial in their personal lives.  12:00 - 12:50: Clinician continued with topic of communication. Patients identified communication styles in short video clips. Patients identified their main form of communication and reasons why it works, or reasons to think about changing it.  12:50- 1:00: Clinician led check-out. Clinician assessed for immediate needs, medication compliance and efficacy, and safety concerns.    Suicidal/Homicidal: Nowithout intent/plan  Therapist Response: Lori Jensen is a 46 y.o. female who presents with depression and anxiety symptoms. Patient arrived within time allowed and reports she is feeling "very anxious." Patient rates her mood at a 5 on a 1- 10 scale with 10 being great. Pt reports she was very tearful last night and could not control her crying. Pt reports she continues to talk to an ex-boyfriend though she knows it is an unhealthy relationship. Patient engaged in activity and discussion. Patient appears to be holding steady at this time.  Patient denies SI/HI/self-harm thoughts at end of session.    Plan: Patient will continue in PHP and medication management. Pt will work on decreasing passive SI, depression, and anxiety symptoms while increasing distress tolerance and emotional regulation skills.   Diagnosis: Severe recurrent major depression without psychotic features ([place]) [F33.2]    1. Severe recurrent major depression without psychotic features ([place])     Royetta Crochet, LPCA 09/23/2017

## 2017-09-26 ENCOUNTER — Other Ambulatory Visit (HOSPITAL_COMMUNITY): Payer: 59

## 2017-09-26 ENCOUNTER — Other Ambulatory Visit (HOSPITAL_COMMUNITY): Payer: 59 | Admitting: Licensed Clinical Social Worker

## 2017-09-26 DIAGNOSIS — F332 Major depressive disorder, recurrent severe without psychotic features: Secondary | ICD-10-CM

## 2017-09-26 NOTE — Progress Notes (Signed)
Progress note      Lori Jensen is struggling with finances ( no checks since early Dec because the disability has not been set up yet).  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  She has been having suicidal thoughts, after talking she believes she will be safe to go home and will call if needed.  Her son is very good person as is her daughter and they give her strength.

## 2017-09-27 ENCOUNTER — Other Ambulatory Visit (HOSPITAL_COMMUNITY): Payer: Self-pay

## 2017-09-27 ENCOUNTER — Ambulatory Visit (HOSPITAL_COMMUNITY): Payer: Self-pay

## 2017-09-28 ENCOUNTER — Encounter (HOSPITAL_COMMUNITY): Payer: Self-pay

## 2017-09-28 ENCOUNTER — Other Ambulatory Visit (HOSPITAL_COMMUNITY): Payer: 59 | Attending: Psychiatry | Admitting: Licensed Clinical Social Worker

## 2017-09-28 ENCOUNTER — Other Ambulatory Visit (HOSPITAL_COMMUNITY): Payer: 59

## 2017-09-28 VITALS — BP 128/82 | HR 81 | Ht 66.25 in | Wt 226.0 lb

## 2017-09-28 DIAGNOSIS — F332 Major depressive disorder, recurrent severe without psychotic features: Secondary | ICD-10-CM | POA: Insufficient documentation

## 2017-09-28 DIAGNOSIS — Z79899 Other long term (current) drug therapy: Secondary | ICD-10-CM | POA: Insufficient documentation

## 2017-09-28 NOTE — Progress Notes (Signed)
Patient presents with sad affect, depressed mood and reported she is mainly concerned about possibly having to return to work soon and not feeling ready.  Patient reported she has applied for an extension but stated some days she cannot hardly bring herself to come into PHP, so she is really worried about being able to go back to work and manage whole days.  Patient cried several times during time together today as she reports feeling like a failure at times and financial strain of going weeks without a paycheck has been tough on her and her children.  Patient provided the example of feeling like a failure when she recently had to tell her 2 year old son she did not have any extra money for him to go with a friend to the mall.  Patient reported her son is; however, very understanding and even reported he may get a job to help out.  Patient stated she was looking back at her own past and trying to draw on times where she also got herself through hard times as a source of inspiration and hope.  Patient agreed no matter what happens with work, she will work with PHP to help develop a good plan for after care services once PHP is complete as states she likes the support groups offer.  Patient denied any current suicidal or homicidal ideations with no plan or intent to harm self and agreed to inform staff if this changed or began to have worsening of symptoms.  Patient reviewed medications with this nurse and stated some things better since addition of Clonazepam.   Patient discussed weight gain and reviewed healthy eating and exercises she could do to help with this and will continue to monitor.  Patient stable at this time and agreed to contact [facility] staff if any thoughts of wanting to harm self or others with plan or intent developed.  Patient returned to group today discussed use of learned coping skills to help during periods of increased worrying and anxiousness.

## 2017-09-29 ENCOUNTER — Encounter (HOSPITAL_COMMUNITY): Payer: Self-pay

## 2017-09-29 ENCOUNTER — Other Ambulatory Visit (HOSPITAL_COMMUNITY): Payer: 59

## 2017-09-29 ENCOUNTER — Other Ambulatory Visit (HOSPITAL_COMMUNITY): Payer: 59 | Admitting: Specialist

## 2017-09-29 ENCOUNTER — Other Ambulatory Visit (HOSPITAL_COMMUNITY): Payer: 59 | Admitting: Licensed Clinical Social Worker

## 2017-09-29 DIAGNOSIS — F332 Major depressive disorder, recurrent severe without psychotic features: Secondary | ICD-10-CM

## 2017-09-29 DIAGNOSIS — R4589 Other symptoms and signs involving emotional state: Secondary | ICD-10-CM

## 2017-09-29 NOTE — Psych (Signed)
   [facility] BH PHP THERAPIST PROGRESS NOTE  Lori Jensen [id number]  Session Time: 9am - 2 pm  Participation Level: Active  Behavioral Response: CasualAlertDepressed  Type of Therapy: Group Therapy; Psychotherapy, Psychoeducation, Activity Therapy, Medication group  Treatment Goals addressed: Coping  Interventions: CBT, DBT, Supportive and Reframing  Summary:  9:00 - 11:00: Pharmacist discussed medication with group and answered questions.  11:00 -12:15: Clinician led check-in regarding current stressors and situation, and review of patient completed daily inventory. Clinician utilized active listening and empathetic response and validated patient emotions. Clinician facilitated processing group on pertinent issues.  12:15 - 1:00 Reflection group: Patients encouraged to practice skills and interpersonal techniques or work on mindfulness and relaxation techniques. The importance of self-care and making skills part of a routine to increase usage were stressed.  1:00 - 1:50 Clinician introduced topic of "Vulnerability." Group watched "The Power of Vulnerability" TedTalk and discussed why being vulnerable is difficult and important.  1:50 - 2:00 Clinician led check-out. Clinician assessed for immediate needs, medication compliance and efficacy, and safety concerns.    Suicidal/Homicidal: Yeswithout intent/plan  Therapist Response: Lori Jensen is a 46 y.o. female who presents with depression and anxiety symptoms. Patient arrived within time allowed and reports she is feeling "like I want to come out of my skin." Patient rates her mood at a 4 on a 1- 10 scale with 10 being great. Pt states that she had a high anxiety weekend and is feeling very overwhelmed. Pt reports SI and feeling like "I can't take it anymore." Pt reports triggers of financial stress and talking to an unhealthy ex. Pt met with psychiatrist to assess for risk.  Patient engaged in activity and discussion. Patient denies  SI/HI/self-harm thoughts at end of session.    Plan: Patient will continue in PHP and medication management. Pt will work on decreasing passive SI, depression, and anxiety symptoms while increasing distress tolerance and emotional regulation skills.   Diagnosis: Severe recurrent major depression without psychotic features ([facility]) [F33.2]    1. Severe recurrent major depression without psychotic features ([facility])      , LCSW 09/29/2017 

## 2017-09-29 NOTE — Therapy (Signed)
[place], [place], [address] Phone: [phone]   Fax:  [phone]  Occupational Therapy Treatment  Patient Details  Name: Lori Jensen MRN: [id number] Date of Birth: 1970-10-04 Referring Provider (Historical): Dr. Neita Garnet   Encounter Date: 09/29/2017  OT End of Session - 09/29/17 1311    Visit Number  4    Number of Visits  6    Date for OT Re-Evaluation  10/07/17    Authorization Type  [employer]igna    OT Start Time  1030    OT Stop Time  1130    OT Time Calculation (min)  60 min    Activity Tolerance  Patient tolerated treatment well    Behavior During Therapy  [facility] for tasks assessed/performed       Past Medical History:  Diagnosis Date  . Anxiety   . Depression   . Dysmenorrhea   . Heart murmur   . Hypertension   . PONV (postoperative nausea and vomiting)     Past Surgical History:  Procedure Laterality Date  . DILATION AND CURETTAGE OF UTERUS    . DILATION AND CURETTAGE OF UTERUS  01/16/2013   Procedure: DILATATION AND CURETTAGE;  Surgeon: Gus Height, MD;  Location: [facility];  Service: Gynecology;;  . ENDOMETRIAL ABLATION    . uterine ablation  08/13/2011    There were no vitals filed for this visit.  Subjective Assessment - 09/29/17 1310    Currently in Pain?  No/denies    Pain Score  0-No pain      S:   The way we handle situations effects our stress level.  O:  Skilled OT group focused on stress management this date.  Group opened with members identifying 6 stressful situations they have encountered.  Group weighed in on their personal level of stress with each situation.  Group discussed what causes varying levels of stress for the same situation.  Group discussed  recurrent physical and psychological stress can diminish self-esteem, decrease interpersonal and academic effectiveness and create a cycle of self-blame and self-doubt.  Group was educated on importance of  identifying symptoms of stress, events are not stressful but rather our interpretations and reactions to event that are stressful.  Group member took a stress symptom checklist and discussed results.  Group discussed poor coping mechanisms including self medication, suppression, passivity, acting out, blaming/complaining), why it may work short term, as well as possible long term complications.  Group discussed and learned positive coping mechanisms to stress including: relaxation, positive mental attitude, support, self-care and lifestyle changes, interpersonal strategies, emotional strategies, cognitive strategies, and philosophical strategies.  Group member was given handouts with 150 suggestions to decrease and manage stress, and member was able to voice 1 new strategy they will implement to manage stress.  A:  Lori Jensen actively participated in OT group this date.  Patient able to make connection that situation is not stressful as much as our individual way of handling situation.  Lori Jensen agreed to attempt journaling and storywriting to manage stress.   P:  Continue skilled OT group, focus on time management skills.                     OT Education - 09/29/17 1311    Education provided  Yes    Education Details  Patient was educated on 150 techniques to reduce stress     Person(s) Educated  Patient    Methods  Explanation;Handout    Comprehension  Verbalized understanding       OT Short Term Goals - 09/16/17 0834      OT SHORT TERM GOAL #1   Title  Patient will be educated on strategies to improve psychosocial skills needed to participate fully in all daily, work, and leisure activities.    Time  3    Period  Weeks    Status  On-going      OT SHORT TERM GOAL #2   Title  Patient will be educated on a HEP and independent with implementation of HEP.    Time  3    Period  Weeks    Status  On-going      OT SHORT TERM GOAL #3   Title  Patient will independently apply  psychosocial skills and coping mechanisms to her daily activities in order to function independently.    Time  3    Period  Weeks    Status  On-going                 Patient will benefit from skilled therapeutic intervention in order to improve the following deficits and impairments:  (decreased coping skills, decreased psychosocial skills )  Visit Diagnosis: Severe recurrent major depression without psychotic features ([place])  Difficulty coping    Problem List Patient Active Problem List   Diagnosis Date Noted  . SMOKER 07/23/2009  . PALPITATIONS 06/12/2009  . PANIC ATTACKS 11/24/2006  . DEPRESSIVE DISORDER, NOS 11/24/2006  . MITRAL VALVE PROLAPSE 11/24/2006  . CHEST PAIN 11/24/2006    Vangie Bicker, [place], OTR/L [phone]  09/29/2017, 1:12 PM  [place], [place], [address] Phone: [phone]   Fax:  [phone]  Name: Lori Jensen MRN: [id number] Date of Birth: [birth year]

## 2017-09-29 NOTE — Psych (Signed)
   [facility] BH PHP THERAPIST PROGRESS NOTE  [name] [id number]  Session Time: 9am - 2 pm  Participation Level: Active  Behavioral Response: CasualAlertAnxious and Depressed  Type of Therapy: Group Therapy; Psychotherapy, Psychoeducation, Activity Therapy, Spiritual care  Treatment Goals addressed: Coping  Interventions: CBT, DBT, Supportive and Reframing  Summary:  9:00 - 10:30 Clinician led check-in regarding current stressors and situation, and review of patient completed daily inventory. Clinician utilized active listening and empathetic response and validated patient emotions. Clinician facilitated processing group on pertinent issues.  10:30 -12:00 Spiritual care group  12:00 - 12:45 Reflection group: Patients encouraged to practice skills and interpersonal techniques or work on mindfulness and relaxation techniques. The importance of self-care and making skills part of a routine to increase usage were stressed.  12:45 - 1:50 Relaxation group: Cln Jan Fireman led yoga group focused on retraining the body's response to stress.  1:50 - 2:00 Clinician led check-out. Clinician assessed for immediate needs, medication compliance and efficacy, and safety concerns.    Suicidal/Homicidal: Nowithout intent/plan  Therapist Response: Lori Jensen is a 47 y.o. female who presents with depression and anxiety symptoms. Patient arrived within time allowed and reports she is feeling "low." Patient rates her mood at a 4 on a 1- 10 scale with 10 being great. Pt states that she had a high anxiety weekend and is feeling very overwhelmed. Pt states her new years was "fine" and that she was able to manage. Patient engaged in activity and discussion. Patient denies SI/HI/self-harm thoughts at end of session.    Plan: Patient will continue in PHP and medication management. Pt will work on decreasing passive SI, depression, and anxiety symptoms while increasing distress tolerance and emotional  regulation skills.   Diagnosis: Severe recurrent major depression without psychotic features ([place]) [F33.2]    1. Severe recurrent major depression without psychotic features [facility])     [name], LCSW 09/29/2017

## 2017-09-30 ENCOUNTER — Other Ambulatory Visit (HOSPITAL_COMMUNITY): Payer: 59

## 2017-09-30 ENCOUNTER — Other Ambulatory Visit (HOSPITAL_COMMUNITY): Payer: Self-pay

## 2017-09-30 ENCOUNTER — Other Ambulatory Visit (HOSPITAL_COMMUNITY): Payer: 59 | Admitting: Licensed Clinical Social Worker

## 2017-09-30 DIAGNOSIS — F332 Major depressive disorder, recurrent severe without psychotic features: Secondary | ICD-10-CM

## 2017-09-30 DIAGNOSIS — F331 Major depressive disorder, recurrent, moderate: Secondary | ICD-10-CM

## 2017-09-30 MED ORDER — BUPROPION HCL ER (XL) 150 MG PO TB24: 150.0000 mg | ORAL_TABLET | Freq: Every day | ORAL | 0 refills | Status: DC

## 2017-09-30 MED ORDER — BUPROPION HCL ER (XL) 300 MG PO TB24: 300.0000 mg | ORAL_TABLET | ORAL | 0 refills | Status: DC

## 2017-09-30 NOTE — Psych (Signed)
   [facility] BH PHP THERAPIST PROGRESS NOTE  [name] [id number]  Session Time: 9am - 2 pm  Participation Level: Active  Behavioral Response: CasualAlertAnxious and Depressed  Type of Therapy: Group Therapy; Psychotherapy, Psychoeducation, Activity Therapy, OT  Treatment Goals addressed: Coping  Interventions: CBT, DBT, Supportive and Reframing  Summary:  9:00 -10:30: Clinician led check-in regarding current stressors and situation, and review of patient completed daily inventory. Clinician utilized active listening and empathetic response and validated patient emotions. Clinician facilitated processing group on pertinent issues.  10:30 - 11:30: OT Group  11:30 - 12:15: Clinician introduced topic of "Goals". Group discussed why having goals is important. Group completed a goals worksheet to help narrow down steps to working towards a goal.  12:15 - 1:00: Reflection group: Patients encouraged to practice skills and interpersonal techniques or work on mindfulness and relaxation techniques. The importance of self-care and making skills part of a routine to increase usage were stressed.  1:00-1:50: Art Therapy: The group created individual vision boards related to goals for 2019.  1:50 - 2:00 Clinician led check-out. Clinician assessed for immediate needs, medication compliance and efficacy, and safety concerns.   Suicidal/Homicidal: Nowithout intent/plan  Therapist Response: Lori Jensen is a 47 y.o. female who presents with depression and anxiety symptoms. Patient arrived within time allowed and reports she is feeling "panicky." Patient rates her mood at a 5 on a 1- 10 scale with 10 being great. Pt states she was able to go for a walk yesterday and noticed some mood improvement. Pt shares she has health concerns and work stress that is contributing to her anxiety. Patient engaged in activity and discussion. Pt demonstrates some progress as evidenced by utilizing a coping strategy last  night. Patient denies SI/HI/self-harm thoughts at end of session.    Plan: Patient will continue in PHP and medication management. Pt will work on decreasing passive SI, depression, and anxiety symptoms while increasing distress tolerance and emotional regulation skills.   Diagnosis: Severe recurrent major depression without psychotic features ([place]) [F33.2]    1. Severe recurrent major depression without psychotic features [facility])     [name], LCSW 09/30/2017

## 2017-09-30 NOTE — Psych (Signed)
   [facility] BH PHP THERAPIST PROGRESS NOTE  [name] [id number]  Session Time: 9am - 1 pm  Participation Level: Active  Behavioral Response: CasualAlertAnxious and Depressed  Type of Therapy: Group Therapy; Psychotherapy, Psychoeducation, Activity Therapy  Treatment Goals addressed: Coping  Interventions: CBT, DBT, Supportive and Reframing  Summary:  9:00 - 10:30 Clinician led check-in regarding current stressors and situation, and review of patient completed daily inventory. Clinician utilized active listening and empathetic response and validated patient emotions. Clinician facilitated processing group on pertinent issues.  10:30 -12:00 Clinician led a relationship workshop. Patients had the chance to discuss current relationship issues and work together to come up with possible solutions.  12:00 - 12:50 Group watched "The Person You Really Need to Marry" Ted-Talk. Group discussed how accepting/not accepting self can affect relationships with others.  12:50 - 1:00 Clinician led check-out. Clinician assessed for immediate needs, medication compliance and efficacy, and safety concerns.    Suicidal/Homicidal: Nowithout intent/plan  Therapist Response: Lori Jensen is a 47 y.o. female who presents with depression and anxiety symptoms. Patient arrived within time allowed and reports she is feeling "overwhelmed." Patient rates her mood at a 4 on a 1- 10 scale with 10 being great. Pt reports passive SI with thoughts of "I can't handle this anymore" and denies plan and intent. Pt reports feeling overwhelmed by even small tasks. Pt improves as group progresses. Patient engaged in activity and discussion. Pt identified relationship issues with dependency. Pt demonstrates some progress as evidenced by working with group to manage mood. Patient denies SI/HI/self-harm thoughts at end of session.    Plan: Patient will continue in PHP and medication management. Pt will work on decreasing  passive SI, depression, and anxiety symptoms while increasing distress tolerance and emotional regulation skills.   Diagnosis: Severe recurrent major depression without psychotic features ([place]) [F33.2]    1. Severe recurrent major depression without psychotic features [facility])     [name], LCSW 09/30/2017

## 2017-10-03 ENCOUNTER — Other Ambulatory Visit (HOSPITAL_COMMUNITY): Payer: Self-pay

## 2017-10-03 ENCOUNTER — Other Ambulatory Visit (HOSPITAL_COMMUNITY): Payer: 59

## 2017-10-04 ENCOUNTER — Other Ambulatory Visit (HOSPITAL_COMMUNITY): Payer: 59

## 2017-10-04 ENCOUNTER — Other Ambulatory Visit (HOSPITAL_COMMUNITY): Payer: 59 | Admitting: Licensed Clinical Social Worker

## 2017-10-04 ENCOUNTER — Other Ambulatory Visit (HOSPITAL_COMMUNITY): Payer: 59 | Admitting: Occupational Therapy

## 2017-10-04 DIAGNOSIS — F332 Major depressive disorder, recurrent severe without psychotic features: Secondary | ICD-10-CM | POA: Diagnosis not present

## 2017-10-04 DIAGNOSIS — R4589 Other symptoms and signs involving emotional state: Secondary | ICD-10-CM

## 2017-10-04 NOTE — Therapy (Addendum)
[place], [place], [address] Phone: [phone]   Fax:  [phone]  Occupational Therapy Treatment and Discharge  Patient Details  Name: Lori Jensen MRN: [id number] Date of Birth: 1970/11/14 Referring Provider (Historical): Dr. Neita Garnet   Encounter Date: 10/04/2017  OT End of Session - 10/04/17 1545    Visit Number  5    Number of Visits  6    Date for OT Re-Evaluation  10/07/17    Authorization Type  [employer]    OT Start Time  1030    OT Stop Time  1130    OT Time Calculation (min)  60 min    Activity Tolerance  Patient tolerated treatment well    Behavior During Therapy  [facility] for tasks assessed/performed       Past Medical History:  Diagnosis Date  . Anxiety   . Depression   . Dysmenorrhea   . Heart murmur   . Hypertension   . PONV (postoperative nausea and vomiting)     Past Surgical History:  Procedure Laterality Date  . DILATION AND CURETTAGE OF UTERUS    . DILATION AND CURETTAGE OF UTERUS  01/16/2013   Procedure: DILATATION AND CURETTAGE;  Surgeon: Gus Height, MD;  Location: [facility];  Service: Gynecology;;  . ENDOMETRIAL ABLATION    . uterine ablation  08/13/2011    There were no vitals filed for this visit.  Subjective Assessment - 10/04/17 1545    Currently in Pain?  No/denies         [facility] OT Assessment - 10/04/17 1544      Assessment   Medical Diagnosis  MDD      Precautions   Precautions  None        OT Treatment Group: Sleep Hygiene   S:  "I drink caffeine all day long." O:  Patient actively participated in the following skilled occupational therapy treatment session this date: Sleep hygiene - Began session with sleep hygiene questionnaire. Pt completed questionnaire and participated in discussion. Group then played Sleep Hygiene Taboo game, pt actively participated and engaged in game. Discussed importance of a routine leading up to bedtime, and  educated on factors influencing sleep. Patient marginally engaged during session. Pt participated in group discussion of factors that interfere with sleep and lifestyle factors promoting sleep. Discussed importance of exercise for the body's metabolism and promoting healthy sleep, as well as foods that can help or hinder sleep.  A:  Patient participated in skilled occupational therapy group for sleep hygiene skills this date.  Patient was actively engaged during session and appears open to strategies introduced.  P:  Continue participation in skilled occupational therapy groups  1-2 times per week in order to gain the necessary skills needed to return to full time community living and learn effective coping strategies to be a productive community resident. Next session: time management         OT Short Term Goals - 09/16/17 0834      OT SHORT TERM GOAL #1   Title  Patient will be educated on strategies to improve psychosocial skills needed to participate fully in all daily, work, and leisure activities.    Time  3    Period  Weeks    Status  On-going      OT SHORT TERM GOAL #2   Title  Patient will be educated on a HEP and independent with implementation of HEP.    Time  3    Period  Weeks    Status  On-going      OT SHORT TERM GOAL #3   Title  Patient will independently apply psychosocial skills and coping mechanisms to her daily activities in order to function independently.    Time  3    Period  Weeks    Status  On-going                 Patient will benefit from skilled therapeutic intervention in order to improve the following deficits and impairments:  (decreased coping skills, decreased psychosocial skills )  Visit Diagnosis: Severe recurrent major depression without psychotic features ([place])  Difficulty coping    Problem List Patient Active Problem List   Diagnosis Date Noted  . SMOKER 07/23/2009  . PALPITATIONS 06/12/2009  . PANIC ATTACKS 11/24/2006  .  DEPRESSIVE DISORDER, NOS 11/24/2006  . MITRAL VALVE PROLAPSE 11/24/2006  . CHEST PAIN 11/24/2006   Guadelupe Sabin, OTR/L  [phone] 10/04/2017, 3:45 PM  [facility] PARTIAL HOSPITALIZATION PROGRAM [place], [place], [address] Phone: [phone]   Fax:  [phone]  Name: TAYGAN CONNELL MRN: [id number] Date of Birth: 07-Nov-1970   OCCUPATIONAL THERAPY DISCHARGE SUMMARY  Visits from Start of Care: 5  Current functional level related to goals / functional outcomes: Pt is transferring to IOP as stepdown from PHP to prepare for returning to work. Pt actively participated in 5/6 OT groups and demonstrates follow through of strategies learned.    Remaining deficits: Continued difficulty with implementing learned coping skills.    Education / Equipment: Strategies/techniques/HEP for attended sessions.  Plan: Patient agrees to discharge.  Patient goals were partially met. Patient is being discharged due to meeting the stated rehab goals.  ?????

## 2017-10-05 ENCOUNTER — Other Ambulatory Visit (HOSPITAL_COMMUNITY): Payer: Self-pay

## 2017-10-05 ENCOUNTER — Other Ambulatory Visit (HOSPITAL_COMMUNITY): Payer: 59 | Admitting: Psychiatry

## 2017-10-05 ENCOUNTER — Other Ambulatory Visit (HOSPITAL_COMMUNITY): Payer: 59

## 2017-10-05 DIAGNOSIS — F332 Major depressive disorder, recurrent severe without psychotic features: Secondary | ICD-10-CM | POA: Diagnosis not present

## 2017-10-05 NOTE — Progress Notes (Signed)
Lori Jensen is a 47 y.o., employed, Caucasian female who was transitioned from [facility].  Pt presents as self-referral known to this writer due to past admission in IOP. Pt reported worsening depression and anxiety.  Pt shared she has had intermittent uncontrolled crying spells for the past month; has not leaving her house except for work and basic needs for the past few months; and continued passive SI. Pt reports job stress due to her company being bought out and possible downsizing.  Pt stated since last in treatment in May 2018 (PHP) she has been maintained on medication by her PCP and has not been seeing a therapist. CC:  Previous notes for additional hx.  A:  Re-oriented pt.  Refer pt to a psychiatrist and a DBT therapist.  Strongly encourage support groups, etc at [place]:  Pt receptive.           Carlis Abbott, RITA, M.Ed, CNA

## 2017-10-05 NOTE — Progress Notes (Signed)
    Daily Group Progress Note  Program: IOP  Group Time: 9:00-12:00  Participation Level: Active  Behavioral Response: Appropriate  Type of Therapy:  Group Therapy  Summary of Progress: Pt.'s first day in group. Pt. Met with case manager and psychiatrist for intake assessment. Pt. Presented as sad, tearful, depressed, shared with the group that she felt very "overwhelmed by everything". Pt. Discussed that she felt very guilty by not being able to be more present for her children financially because of being out of work because of her depression. Pt. Discussed her history of being independent and not wanting to depend on her family and stress of waiting on her short-term disability. Pt. Participated in session with the [facility].     ,  B, LPC 

## 2017-10-06 ENCOUNTER — Other Ambulatory Visit (HOSPITAL_COMMUNITY): Payer: 59 | Admitting: Psychiatry

## 2017-10-06 ENCOUNTER — Other Ambulatory Visit (HOSPITAL_COMMUNITY): Payer: Self-pay

## 2017-10-06 ENCOUNTER — Ambulatory Visit (HOSPITAL_COMMUNITY): Payer: Self-pay

## 2017-10-06 ENCOUNTER — Other Ambulatory Visit (HOSPITAL_COMMUNITY): Payer: 59

## 2017-10-06 DIAGNOSIS — F332 Major depressive disorder, recurrent severe without psychotic features: Secondary | ICD-10-CM | POA: Diagnosis not present

## 2017-10-06 NOTE — Progress Notes (Signed)
Patient ID: Lori Jensen, female   DOB: [birth year], 48 y.o.   MRN: [id number] Transfer note      Ms Swetz is being transferred from [facility] to IOP as a step down.  She remains depressed with regular suicidal thoughts but no current plan or intent.  She remains very anxious about returning to work.  One of her bigger stresses is not being paid since the first week in December. Diagnosis remains severe major depression recurrent without psychosis  Admit to IOP with daily group therapy

## 2017-10-06 NOTE — Psych (Signed)
   [facility] BH PHP THERAPIST PROGRESS NOTE  Lori Jensen [id number]  Session Time: 9am - 2 pm  Participation Level: Active  Behavioral Response: CasualAlertAnxious and Depressed  Type of Therapy: Group Therapy; Psychotherapy, Psychoeducation, Activity Therapy, OT  Treatment Goals addressed: Coping  Interventions: CBT, DBT, Supportive and Reframing  Summary:  9:00 - 10:30 Clinician led check-in regarding current stressors and situation, and review of patient completed daily inventory. Clinician utilized active listening and empathetic response and validated patient emotions. Clinician facilitated processing group on pertinent issues.  10:30 -11:30: OT Group  11:30 - 12:00: Clinician led discussion on control and how to recognize what you can and cannot control. Pt's discussed working on acceptance over what they cannot control.  12:00-12:50: Reflection group: Patients encouraged to practice skills and interpersonal techniques or work on mindfulness and relaxation techniques. The importance of self-care and making skills part of a routine to increase usage were stressed.  12:50 - 1:50 Clinician introduced topic of fear. Group watched "Why You Should Define Your Fears Instead of Your Goals" Ted-Talk. Patients discussed fear setting and how to utilize it as a skill.  1:50 - 2:00 Clinician led check-out. Clinician assessed for immediate needs, medication compliance and efficacy, and safety concerns.    Suicidal/Homicidal: Nowithout intent/plan  Therapist Response: [name] is a 47 y.o. female who presents with depression and anxiety symptoms. Patient arrived within time allowed and reports she is feeling "overwhelmed." Patient rates her mood at a 2.5 on a 1- 10 scale with 10 being great. Pt reports hopeless thoughts  of "this is too much." Pt improves as group progresses and states being in a supportive environment such as group increases her ability to manage symptoms. Pt shares  continued struggles with focus, however states she has continued her daily walks and reached out to her sister yesterday when struggling. Patient engaged in activity and discussion. Pt demonstrates some progress as evidenced by processing through feelings while in group. Patient denies SI/HI/self-harm thoughts at end of session.    Plan: Patient will discharge from [facility] and step down to IOP within this agency. Pt has met treatment goals of increased ability to manage symptoms as evidenced by stable mood and ability to process in group setting. Pt will step down to IOP to continue working on ability to increase progress outside of group as well. Pt and psychiatrist are aligned with this plan. Pt will begin IOP tomorrow, 10/05/17. Pt denies SI/HI at time of discharge.    Diagnosis: Severe recurrent major depression without psychotic features ([place]) [F33.2]    1. Severe recurrent major depression without psychotic features [facility])     [name], LCSW 10/06/2017

## 2017-10-06 NOTE — Progress Notes (Signed)
    Daily Group Progress Note  Program: IOP  Group Time: 9:00-12:00  Participation Level: Active  Behavioral Response: Appropriate  Type of Therapy:  Group Therapy  Summary of Progress: Pt. Presented as talkative, engaged in the group process. Pt . Met with the psychiatrist. Pt. Shared with the group that she frequently feels so overwhelmed by her problems that she does not want to think about or feel anything else. Pt. Discussed that she finds work, financial stress, and guilt about perceived mistakes about interpersonal interactions very distressing and fears that she will damage her son the way her mother damaged her. Pt. Received feedback from there counselor and other group members about the use of meditation to develop tolerance for her difficult emotions. Pt. Participated in discussion facilitated by the mental health association about peer support groups and community resources and grief and loss group facilitated by the Chaplain.      Nancie Neas, LPC

## 2017-10-07 ENCOUNTER — Other Ambulatory Visit (HOSPITAL_COMMUNITY): Payer: 59

## 2017-10-07 ENCOUNTER — Other Ambulatory Visit (HOSPITAL_COMMUNITY): Payer: Self-pay

## 2017-10-07 ENCOUNTER — Other Ambulatory Visit (HOSPITAL_COMMUNITY): Payer: 59 | Admitting: Psychiatry

## 2017-10-07 DIAGNOSIS — F332 Major depressive disorder, recurrent severe without psychotic features: Secondary | ICD-10-CM | POA: Diagnosis not present

## 2017-10-10 ENCOUNTER — Other Ambulatory Visit (HOSPITAL_COMMUNITY): Payer: 59

## 2017-10-10 NOTE — Progress Notes (Signed)
    Daily Group Progress Note  Program: IOP  Group Time: 9:00-12:00  Participation Level: Active  Behavioral Response: Appropriate  Type of Therapy:  Group Therapy  Summary of Progress: Pt. Presented as talkative, engaged in the group process. Pt. Continues to report that she is severely depressed and isolating. Pt. Shared that she has a friend who recently had a baby and she feels badly that she has not made plans to visit with the baby. Pt. Received feedback from the group that she needs to challenge herself to begin doing things that she does not feel like doing until she begins to feel better. Participated in group discussion about the use of yoga for the treatment of anxiety and depression. Pt. Participated in discussion about the use of yoga as a complement to the treatment of anxiety and depression.       Nancie Neas, LPC

## 2017-10-11 ENCOUNTER — Other Ambulatory Visit (HOSPITAL_COMMUNITY): Payer: 59

## 2017-10-12 ENCOUNTER — Other Ambulatory Visit (HOSPITAL_COMMUNITY): Payer: 59

## 2017-10-12 ENCOUNTER — Other Ambulatory Visit (HOSPITAL_COMMUNITY): Payer: 59 | Attending: Psychiatry | Admitting: Psychiatry

## 2017-10-12 ENCOUNTER — Telehealth (HOSPITAL_COMMUNITY): Payer: Self-pay | Admitting: Psychiatry

## 2017-10-12 DIAGNOSIS — F332 Major depressive disorder, recurrent severe without psychotic features: Secondary | ICD-10-CM

## 2017-10-12 NOTE — Telephone Encounter (Signed)
D:  Pt requested Probation officer to contact [employer] re: sx's and treatment along with RTW date/re-evaluation.  A:  Contacted Delight Ovens [phone]) and left her a vm with all information.  Informed her that whatever the previous RTW date will remain the same in IOP.   Dellia Nims, M.Ed,CNA

## 2017-10-13 ENCOUNTER — Other Ambulatory Visit (HOSPITAL_COMMUNITY): Payer: 59 | Admitting: Psychiatry

## 2017-10-13 ENCOUNTER — Other Ambulatory Visit (HOSPITAL_COMMUNITY): Payer: 59

## 2017-10-13 DIAGNOSIS — F332 Major depressive disorder, recurrent severe without psychotic features: Secondary | ICD-10-CM | POA: Diagnosis not present

## 2017-10-13 DIAGNOSIS — F331 Major depressive disorder, recurrent, moderate: Secondary | ICD-10-CM

## 2017-10-14 ENCOUNTER — Other Ambulatory Visit (HOSPITAL_COMMUNITY): Payer: 59

## 2017-10-14 ENCOUNTER — Other Ambulatory Visit (HOSPITAL_COMMUNITY): Payer: 59 | Admitting: Psychiatry

## 2017-10-14 DIAGNOSIS — F331 Major depressive disorder, recurrent, moderate: Secondary | ICD-10-CM

## 2017-10-14 DIAGNOSIS — F332 Major depressive disorder, recurrent severe without psychotic features: Secondary | ICD-10-CM | POA: Diagnosis not present

## 2017-10-14 MED ORDER — BUPROPION HCL ER (XL) 150 MG PO TB24: 150.0000 mg | ORAL_TABLET | ORAL | 0 refills | Status: DC

## 2017-10-14 MED ORDER — BUPROPION HCL ER (XL) 300 MG PO TB24: 300.0000 mg | ORAL_TABLET | ORAL | 0 refills | Status: DC

## 2017-10-14 NOTE — Progress Notes (Signed)
    Daily Group Progress Note  Program: IOP  Group Time: 9:00-12:00  Participation Level: Active  Behavioral Response: Appropriate  Type of Therapy:  Group Therapy  Summary of Progress: Pt. Presented with significantly brightened mood, talkative, actively engaged in the therapy process. Pt. Stated that this had been a very good week for her. Pt. Stated that she had two consecutive days of going to lunch by herself and was excited to tell the group about going to drag queen brunch with her daughter. Pt. Provided helpful feedback to other group member who was struggling with issues related to wanting to be more involved in personal and workplace relationships. Pt. Offered song for the group to listen to "Hold on Change is Coming" that she listens to regularly because it inspires her. Pt. listened to music selections offered by other group members that inspire them and helped them to describe their emotional states. Pt. Listened to reflection and suggestion for meditation from book of awakening.       Nancie Neas, LPC

## 2017-10-17 ENCOUNTER — Other Ambulatory Visit (HOSPITAL_COMMUNITY): Payer: 59 | Admitting: Psychiatry

## 2017-10-17 ENCOUNTER — Other Ambulatory Visit (HOSPITAL_COMMUNITY): Payer: 59

## 2017-10-17 DIAGNOSIS — F331 Major depressive disorder, recurrent, moderate: Secondary | ICD-10-CM

## 2017-10-17 DIAGNOSIS — F332 Major depressive disorder, recurrent severe without psychotic features: Secondary | ICD-10-CM | POA: Diagnosis not present

## 2017-10-18 ENCOUNTER — Other Ambulatory Visit (HOSPITAL_COMMUNITY): Payer: 59 | Admitting: Psychiatry

## 2017-10-18 ENCOUNTER — Ambulatory Visit
Admission: RE | Admit: 2017-10-18 | Discharge: 2017-10-18 | Disposition: A | Discharge status: Home / Self Care | Payer: Self-pay | Source: Ambulatory Visit | Attending: Family Medicine | Admitting: Family Medicine

## 2017-10-18 ENCOUNTER — Other Ambulatory Visit (HOSPITAL_COMMUNITY): Payer: 59

## 2017-10-18 DIAGNOSIS — F331 Major depressive disorder, recurrent, moderate: Secondary | ICD-10-CM

## 2017-10-18 DIAGNOSIS — F332 Major depressive disorder, recurrent severe without psychotic features: Secondary | ICD-10-CM | POA: Diagnosis not present

## 2017-10-18 DIAGNOSIS — Z139 Encounter for screening, unspecified: Secondary | ICD-10-CM

## 2017-10-18 NOTE — Progress Notes (Signed)
    Daily Group Progress Note  Program: IOP Time: 9:00-12:00  Type of Therapy:  Group Therapy   Participation Level:  Active   Participation Quality:  Appropriate   Affect:  Appropriate   Cognitive:  Appropriate   Insight:  Appropriate   Engagement in Group:  Engaged   Modes of Intervention:  Problem-solving Lori Jensen is a 47 year old female. Patient with history of depressed mood and anxiety. Counselor utilized therapeutic skills and motivational interviewing skills and group processing as a means for therapeutic intervention.  This first and second hour of today's session focused on "This past weekend" and the associated symptoms related to her depression. The last hour was an invited guest speaker (Chaplain/Bob). Patient reported having a "great weekend". She either feels really low or has periods of time she is very happy. She reports feeling worried about her next low and ruminates on when her depression will return. Reports spending money she doesn't have on online shopping to make her feel better. The patient has been working at her job for 20 yrs, missed several days of work lately because was unable to get out of bed. States she is wanting to get better so that she may return to work. She shared that her support system are her friends/family. However, doesn't feel they truly understand her mental illness. Group members praised patient for her motivation and also for having a great weekend. Group members reported that she was encouraging. Pt is dressed in street clothes, alert, oriented x4 with normal speech and normal motor behavior. Patient presents with sad affect, depressed mood and reported she did not sleep as well the previous night.  Eye contact is fair. Pt's mood is appropriate and affect is congruent with mood. Thought process is coherent and relevant. Pt's insight is fair and judgement is fair. There is no indication Pt is currently responding to internal stimuli or  experiencing delusional thought content. Pt was cooperative throughout assessment.  Nancie Neas, LPC

## 2017-10-19 ENCOUNTER — Other Ambulatory Visit (HOSPITAL_COMMUNITY): Payer: 59

## 2017-10-19 ENCOUNTER — Other Ambulatory Visit (HOSPITAL_COMMUNITY): Payer: 59 | Admitting: Psychiatry

## 2017-10-19 DIAGNOSIS — F331 Major depressive disorder, recurrent, moderate: Secondary | ICD-10-CM

## 2017-10-19 DIAGNOSIS — F332 Major depressive disorder, recurrent severe without psychotic features: Secondary | ICD-10-CM | POA: Diagnosis not present

## 2017-10-19 NOTE — Progress Notes (Signed)
    Daily Group Progress Note  Program: IOP Group Time: 9:00-12:00 Participation Level: Active Behavioral Response: Appropriate, Sharing  Type of Therapy:  Group Summary of Progress: Group session was focused on normalizing experienced anger and distinguishing it with the other feelings associated with the anger. Session also focused on being how to become more comfortable with unpleasant emotions in order to develop productive emotional regulation. Lastly, topics of self care was addressed in terms of positively coping with negative life situations. Pt. openly shared her anxiety she felt about going back to work. Expressed anger towards her supervisor. States she enjoys her job however she emphasized that her anger is experienced because of the people at her job. She also has interest in strategies for managing anger.   Nancie Neas, LPC

## 2017-10-19 NOTE — Progress Notes (Signed)
    Daily Group Progress Note  Program: IOP  Group Time: 9:00-12:00 Participation Level: Active Behavioral Response: Appropriate, Sharing Type of Therapy:  Group Summary of Progress: Group session focused on how past experiences can affect current moods and behaviors. Members shared present awareness on their current standpoints in life. Group session focused on correlating the past with how positive or negative thoughts are further shaped and processed. The session also focused on the results of goal setting, positive affirmations of change and relating to the experiences of other group members. Pt. cried about her early childhood and gave thought towards her traumatic past. Stated she does not recall having career goals and dreams as a child due to negative events that occurred. 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 Group members and leaders expressed very positive affirmation and feedback.  Nancie Neas, LPC

## 2017-10-20 ENCOUNTER — Other Ambulatory Visit (HOSPITAL_COMMUNITY): Payer: 59

## 2017-10-20 ENCOUNTER — Other Ambulatory Visit (HOSPITAL_COMMUNITY): Payer: 59 | Admitting: Psychiatry

## 2017-10-20 DIAGNOSIS — F331 Major depressive disorder, recurrent, moderate: Secondary | ICD-10-CM

## 2017-10-20 DIAGNOSIS — F332 Major depressive disorder, recurrent severe without psychotic features: Secondary | ICD-10-CM | POA: Diagnosis not present

## 2017-10-21 ENCOUNTER — Other Ambulatory Visit (HOSPITAL_COMMUNITY): Payer: 59 | Admitting: Psychiatry

## 2017-10-21 ENCOUNTER — Other Ambulatory Visit (HOSPITAL_COMMUNITY): Payer: 59

## 2017-10-21 DIAGNOSIS — F331 Major depressive disorder, recurrent, moderate: Secondary | ICD-10-CM

## 2017-10-21 DIAGNOSIS — F332 Major depressive disorder, recurrent severe without psychotic features: Secondary | ICD-10-CM | POA: Diagnosis not present

## 2017-10-21 NOTE — Progress Notes (Signed)
    Daily Group Progress Note  Program: IOP  Group Time: 9:00-12:00 Participation Level: Active Behavioral Response: Appropriate, Sharing,  Type of Therapy:  Group Summary of Progress: Group session focused on themes involving the thoughts of "what do I really want out of life right now" and "how am I willing to work in order to get there." Group members spoke on topics pertaining to work and family life and past struggles that they have overcome. Session also focused on the concept of creating healthy boundaries and health wellness. Members also related to each other and provided feedback throughout the group session. Patient expressed feeling a bit anxious about her doctor appointment although, she states that worrying won't help her. She also mentioned texting her best friend, in which her best friend responded positively to hanging out this weekend. Although patient feels guilty for not wanting to go to her cousin's funeral but she is receptive to the group's idea about paying respects through a card or the like. Patient also feels saddened due to lack of a loving relationship between her and her mother. She also seems to have a troubling void that affects her mentally and physically in regards to how her mother treated her since she was a child. Furthermore, patient receives empathy and acceptance from the group and the suggestion to set boundaries.  Nancie Neas, LPC

## 2017-10-21 NOTE — Progress Notes (Signed)
    Daily Group Progress Note  Program: IOP  Group Time: 9:00-12:00 Participation Level: Active Behavioral Response: Appropriate, Sharing,  Type of Therapy:  Group Summary of Progress: Group session was focused on themes involving stressors and dissatisfaction with social and work related areas in life. Session also address changes that may need to occur as well as ideas on how to achieve them. Lastly, the topic of self-care was brought up in order to further assess wellness within the lives of the patients. Patient reports feeling a little overwhelmed with anxiety since she did not take all of her meds as she thought she would not need all of them. Also mentioned how pleased she was about completing yoga. Patient expressed looking forward to continuing yoga as well. In regards to self-care, patient is open to calling her best friend and setting up time to hang out with her.  Nancie Neas, LPC

## 2017-10-24 ENCOUNTER — Other Ambulatory Visit (HOSPITAL_COMMUNITY): Payer: 59

## 2017-10-24 NOTE — Progress Notes (Signed)
    Daily Group Progress Note  Program: IOP  Group Time: 9:00-12:00 Participation Level: Active Behavioral Response: Appropriate and sharing Type of Therapy:  Group Summary of Progress: The theme in group today focused on awareness of the present situation that each member were experiencing.  Therapist facilitated the session and addressed each member issues with positive affirmation to clarify and courage members to explore better options. Patient shared she was feeling fine.  She is anxious about returning to work, but will try her best to limit her conversation about why she took off from work.  She was active and very supportive of group members.  Her behavioral response was appropriate.  Nancie Neas, LPC

## 2017-10-25 ENCOUNTER — Other Ambulatory Visit (HOSPITAL_COMMUNITY): Payer: 59

## 2017-10-26 ENCOUNTER — Other Ambulatory Visit (HOSPITAL_COMMUNITY): Payer: 59

## 2017-10-27 ENCOUNTER — Other Ambulatory Visit (HOSPITAL_COMMUNITY): Payer: 59

## 2017-10-27 ENCOUNTER — Other Ambulatory Visit (HOSPITAL_COMMUNITY): Payer: 59 | Admitting: Psychiatry

## 2017-10-27 NOTE — Progress Notes (Signed)
Patient ID: NOEMI BELLISSIMO, female   DOB: [birth year], 47 y.o.   MRN: [id number] [facility] IOP DISCHARGE NOTE  Patient:  Lori Jensen DOB:  [birth year]  Date of Admission: 10/05/2017  Date of Discharge: 10/27/2017  Reason for Admission:[facility]  IOP Course:attended and participated.  Gradually less depressed and anxious and seems ready to return to work in about a week as planned.  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

## 2017-10-27 NOTE — Progress Notes (Signed)
Lori Jensen is a 47 y.o. , employed, Caucasian female who was transitioned from [facility].  Pt presents as self-referral known to this writer due to past admission in IOP. Pt reported worsening depression and anxiety. Pt shared she has had intermittent uncontrolled crying spells for the past month; has not leaving her house except for work and basic needs for the past few months; and continued passive SI. Pt reports job stress due to her company being bought out and possible downsizing. Pt stated since last in treatment in May 2018 (PHP) she has been maintained on medication by her PCP and has not been seeing a therapist. CC:  Previous notes for additional hx.  Pt phoned stating that she would rather be discharged today due to scheduled appointments today and tomorrow.  Overall mood improved.  Denies SI/HI or A/V hallucinations.  Pt has been applying skills learned.   A:  D/C pt today.  F/U with Pattricia Boss, APRN on 11-01-17 @ 1pm and Dr. Daron Offer on 12-22-17 @ 9:30 a.m.  Strongly encouraged support groups; along with [facility].  RTW on 10-31-17; without any restrictions.  R:  Pt receptive.         Carlis Abbott, RITA, M.Ed,CNA

## 2017-10-28 ENCOUNTER — Other Ambulatory Visit (HOSPITAL_COMMUNITY): Payer: 59

## 2017-10-31 ENCOUNTER — Other Ambulatory Visit (HOSPITAL_COMMUNITY): Payer: 59

## 2017-11-01 ENCOUNTER — Other Ambulatory Visit (HOSPITAL_COMMUNITY): Payer: 59

## 2017-11-02 ENCOUNTER — Other Ambulatory Visit (HOSPITAL_COMMUNITY): Payer: 59

## 2017-11-03 ENCOUNTER — Other Ambulatory Visit (HOSPITAL_COMMUNITY): Payer: 59

## 2017-11-04 ENCOUNTER — Other Ambulatory Visit (HOSPITAL_COMMUNITY): Payer: 59

## 2017-11-07 ENCOUNTER — Other Ambulatory Visit (HOSPITAL_COMMUNITY): Payer: 59

## 2017-11-08 ENCOUNTER — Other Ambulatory Visit (HOSPITAL_COMMUNITY): Payer: 59

## 2017-11-09 ENCOUNTER — Other Ambulatory Visit (HOSPITAL_COMMUNITY): Payer: 59

## 2017-11-10 ENCOUNTER — Other Ambulatory Visit (HOSPITAL_COMMUNITY): Payer: 59

## 2017-11-11 ENCOUNTER — Other Ambulatory Visit (HOSPITAL_COMMUNITY): Payer: 59

## 2017-11-14 ENCOUNTER — Other Ambulatory Visit (HOSPITAL_COMMUNITY): Payer: 59

## 2017-11-15 ENCOUNTER — Other Ambulatory Visit (HOSPITAL_COMMUNITY): Payer: 59

## 2017-11-16 ENCOUNTER — Other Ambulatory Visit (HOSPITAL_COMMUNITY): Payer: 59

## 2017-11-17 ENCOUNTER — Other Ambulatory Visit (HOSPITAL_COMMUNITY): Payer: 59

## 2017-11-18 ENCOUNTER — Other Ambulatory Visit (HOSPITAL_COMMUNITY): Payer: 59

## 2017-11-21 ENCOUNTER — Other Ambulatory Visit (HOSPITAL_COMMUNITY): Payer: 59

## 2017-11-22 ENCOUNTER — Other Ambulatory Visit (HOSPITAL_COMMUNITY): Payer: 59

## 2017-11-23 ENCOUNTER — Other Ambulatory Visit (HOSPITAL_COMMUNITY): Payer: 59

## 2017-11-24 ENCOUNTER — Other Ambulatory Visit (HOSPITAL_COMMUNITY): Payer: 59

## 2017-11-25 ENCOUNTER — Other Ambulatory Visit (HOSPITAL_COMMUNITY): Payer: 59

## 2017-11-25 DIAGNOSIS — M797 Fibromyalgia: Secondary | ICD-10-CM

## 2017-11-25 HISTORY — DX: Fibromyalgia: M79.7

## 2017-11-28 ENCOUNTER — Other Ambulatory Visit (HOSPITAL_COMMUNITY): Payer: 59

## 2017-11-29 ENCOUNTER — Other Ambulatory Visit (HOSPITAL_COMMUNITY): Payer: 59

## 2017-12-03 DIAGNOSIS — G43909 Migraine, unspecified, not intractable, without status migrainosus: Secondary | ICD-10-CM | POA: Insufficient documentation

## 2017-12-11 ENCOUNTER — Encounter (HOSPITAL_BASED_OUTPATIENT_CLINIC_OR_DEPARTMENT_OTHER): Payer: Self-pay | Admitting: *Deleted

## 2017-12-11 ENCOUNTER — Emergency Department (HOSPITAL_BASED_OUTPATIENT_CLINIC_OR_DEPARTMENT_OTHER): Payer: Managed Care, Other (non HMO)

## 2017-12-11 ENCOUNTER — Emergency Department (HOSPITAL_BASED_OUTPATIENT_CLINIC_OR_DEPARTMENT_OTHER)
Admission: EM | Admit: 2017-12-11 | Discharge: 2017-12-11 | Disposition: A | Discharge status: Home / Self Care | Payer: Managed Care, Other (non HMO) | Attending: Emergency Medicine | Admitting: Emergency Medicine

## 2017-12-11 ENCOUNTER — Other Ambulatory Visit: Payer: Self-pay

## 2017-12-11 DIAGNOSIS — I10 Essential (primary) hypertension: Secondary | ICD-10-CM | POA: Diagnosis not present

## 2017-12-11 DIAGNOSIS — R0789 Other chest pain: Secondary | ICD-10-CM | POA: Insufficient documentation

## 2017-12-11 DIAGNOSIS — R519 Headache, unspecified: Secondary | ICD-10-CM

## 2017-12-11 DIAGNOSIS — R05 Cough: Secondary | ICD-10-CM | POA: Insufficient documentation

## 2017-12-11 DIAGNOSIS — J069 Acute upper respiratory infection, unspecified: Secondary | ICD-10-CM | POA: Diagnosis not present

## 2017-12-11 DIAGNOSIS — Z79899 Other long term (current) drug therapy: Secondary | ICD-10-CM | POA: Diagnosis not present

## 2017-12-11 DIAGNOSIS — R51 Headache: Secondary | ICD-10-CM | POA: Insufficient documentation

## 2017-12-11 DIAGNOSIS — B349 Viral infection, unspecified: Secondary | ICD-10-CM | POA: Diagnosis not present

## 2017-12-11 DIAGNOSIS — Z87891 Personal history of nicotine dependence: Secondary | ICD-10-CM | POA: Diagnosis not present

## 2017-12-11 DIAGNOSIS — B9789 Other viral agents as the cause of diseases classified elsewhere: Secondary | ICD-10-CM

## 2017-12-11 LAB — CBC WITH DIFFERENTIAL/PLATELET
Basophils Absolute: 0 10*3/uL (ref 0.0–0.1)
Basophils Relative: 0 %
EOS PCT: 4 %
Eosinophils Absolute: 0.3 10*3/uL (ref 0.0–0.7)
HEMATOCRIT: 40 % (ref 36.0–46.0)
HEMOGLOBIN: 13.6 g/dL (ref 12.0–15.0)
LYMPHS ABS: 3 10*3/uL (ref 0.7–4.0)
LYMPHS PCT: 36 %
MCH: 28.1 pg (ref 26.0–34.0)
MCHC: 34 g/dL (ref 30.0–36.0)
MCV: 82.6 fL (ref 78.0–100.0)
Monocytes Absolute: 0.5 10*3/uL (ref 0.1–1.0)
Monocytes Relative: 6 %
NEUTROS ABS: 4.5 10*3/uL (ref 1.7–7.7)
NEUTROS PCT: 54 %
Platelets: 254 10*3/uL (ref 150–400)
RBC: 4.84 MIL/uL (ref 3.87–5.11)
RDW: 13.4 % (ref 11.5–15.5)
WBC: 8.3 10*3/uL (ref 4.0–10.5)

## 2017-12-11 LAB — BASIC METABOLIC PANEL
ANION GAP: 11 (ref 5–15)
BUN: 11 mg/dL (ref 6–20)
CHLORIDE: 107 mmol/L (ref 101–111)
CO2: 22 mmol/L (ref 22–32)
Calcium: 8.7 mg/dL — ABNORMAL LOW (ref 8.9–10.3)
Creatinine, Ser: 0.88 mg/dL (ref 0.44–1.00)
GFR calc Af Amer: >60 mL/min (ref >60)
GFR calc non Af Amer: >60 mL/min (ref >60)
GLUCOSE: 105 mg/dL — AB (ref 65–99)
POTASSIUM: 3.5 mmol/L (ref 3.5–5.1)
Sodium: 140 mmol/L (ref 135–145)

## 2017-12-11 LAB — URINALYSIS, ROUTINE W REFLEX MICROSCOPIC
Bilirubin Urine: NEGATIVE
GLUCOSE, UA: NEGATIVE mg/dL
Hgb urine dipstick: NEGATIVE
KETONES UR: NEGATIVE mg/dL
LEUKOCYTES UA: NEGATIVE
Nitrite: NEGATIVE
PH: 6 (ref 5.0–8.0)
Protein, ur: NEGATIVE mg/dL
Specific Gravity, Urine: 1.02 (ref 1.005–1.030)

## 2017-12-11 LAB — TROPONIN I: Troponin I: <0.03 ng/mL (ref <0.03)

## 2017-12-11 MED ORDER — SODIUM CHLORIDE 0.9 % IV BOLUS (SEPSIS)
1000.0000 mL | Freq: Once | INTRAVENOUS | Status: AC
  Administered 2017-12-11: 1000 mL via INTRAVENOUS

## 2017-12-11 MED ORDER — DIPHENHYDRAMINE HCL 50 MG/ML IJ SOLN
25.0000 mg | Freq: Once | INTRAMUSCULAR | Status: AC
  Administered 2017-12-11: 25 mg via INTRAVENOUS
  Filled 2017-12-11: qty 1

## 2017-12-11 MED ORDER — METOCLOPRAMIDE HCL 5 MG/ML IJ SOLN
10.0000 mg | Freq: Once | INTRAMUSCULAR | Status: AC
  Administered 2017-12-11: 10 mg via INTRAVENOUS
  Filled 2017-12-11: qty 2

## 2017-12-11 MED ORDER — PROMETHAZINE HCL 25 MG PO TABS: 25.0000 mg | ORAL_TABLET | Freq: Four times a day (QID) | ORAL | 0 refills | Status: DC | PRN

## 2017-12-11 MED ORDER — KETOROLAC TROMETHAMINE 30 MG/ML IJ SOLN
30.0000 mg | Freq: Once | INTRAMUSCULAR | Status: AC
  Administered 2017-12-11: 30 mg via INTRAVENOUS
  Filled 2017-12-11: qty 1

## 2017-12-11 NOTE — Discharge Instructions (Signed)
You may alternate Tylenol 1000 mg every 6 hours as needed for pain and Ibuprofen 800 mg every 8 hours as needed for pain.  Please take Ibuprofen with food. ° °

## 2017-12-11 NOTE — ED Provider Notes (Signed)
TIME SEEN: 4:01 AM  CHIEF COMPLAINT: Multiple complaints  HPI: Patient is a 47 year old female with history of hypertension, anxiety and depression who presents to the emergency department with multiple complaints.  Patient states that for the past week she has had cough and congestion.  She was seen by her primary care physician Dr. Doreene Nest on March 12.  At that time she had a flu swab that was negative.  She reports she did get a flu shot this year.  Was started on azithromycin which she has taken 4 days of.  States cough and congestion have improved but not completely resolved.  States she is now also having left-sided sharp chest pain that is worse with exertion and shortness of breath.  No chest pain currently.  No tightness or pressure.  No history of MI or CAD.  No history of PE, DVT, exogenous estrogen use, recent fractures, surgery, trauma, hospitalization or prolonged travel. No lower extremity swelling or pain. No calf tenderness.  Also complains of feeling soreness in the right mid back.  No injury to her back.  No numbness or focal weakness.  No dysuria or hematuria.  Also complaining of feeling like her lymph nodes are swollen in her neck bilaterally and into her left collarbone.  Complains of headache that is posterior.  Headaches have been intermittent for the past several days with nausea and one episode of vomiting on the 14th.  Did have nausea on the 15th.  No diarrhea.  No abdominal pain.  Also complaining of bilateral ear pain.  States her throat has felt sore as well.  She was previously a smoker but quit over a year ago.  Has been having intermittent headaches for several weeks and has an MRI scheduled as an outpatient on the 21st.  ROS: See HPI Constitutional: no fever  Eyes: no drainage  ENT: no runny nose   Cardiovascular:  no chest pain  Resp: no SOB  GI: no vomiting GU: no dysuria Integumentary: no rash  Allergy: no hives  Musculoskeletal: no leg swelling  Neurological:  no slurred speech ROS otherwise negative  PAST MEDICAL HISTORY/PAST SURGICAL HISTORY:  Past Medical History:  Diagnosis Date  . Anxiety   . Depression   . Dysmenorrhea   . Heart murmur   . Hypertension   . PONV (postoperative nausea and vomiting)     MEDICATIONS:  Prior to Admission medications   Medication Sig Start Date End Date Taking? Authorizing Provider  ALBUTEROL IN Inhale 2 puffs into the lungs every 6 (six) hours as needed (wheezing).     [provider]  bisacodyl (BISACODYL) 5 MG EC tablet Take 1 tablet (5 mg total) by mouth daily as needed for moderate constipation. 11/17/16   Constant, Peggy, MD  buPROPion (WELLBUTRIN XL) 150 MG 24 hr tablet Take 1 tablet (150 mg total) by mouth every morning. 10/14/17 10/14/18  Clarene Reamer, MD  buPROPion (WELLBUTRIN XL) 300 MG 24 hr tablet Take 1 tablet (300 mg total) by mouth every morning. 10/14/17 10/14/18  Clarene Reamer, MD  clonazePAM (KLONOPIN) 1 MG tablet Take 1 tablet (1 mg total) by mouth 3 (three) times daily as needed for anxiety. 09/13/17 09/13/18  Clarene Reamer, MD  DULoxetine (CYMBALTA) 30 MG capsule Take 1 capsule (30 mg total) by mouth daily. 09/13/17   Clarene Reamer, MD  hydrochlorothiazide (HYDRODIURIL) 25 MG tablet Take 25 mg by mouth daily.  01/31/13   [provider]  HYDROcodone-acetaminophen (NORCO/VICODIN) 5-325 MG  tablet Take 1-2 tablets by mouth every 6 (six) hours as needed. Patient not taking: Reported on 09/09/2017 03/11/17   Veryl Speak, MD  ibuprofen (ADVIL,MOTRIN) 600 MG tablet Take 1 tablet (600 mg total) by mouth every 6 (six) hours as needed. 01/23/17   Nona Dell, PA-C  lisinopril (PRINIVIL,ZESTRIL) 10 MG tablet Take 10 mg by mouth daily.    [provider]  meloxicam (MOBIC) 15 MG tablet Take 1 tablet (15 mg total) by mouth daily. Patient not taking: Reported on 09/09/2017 05/25/17   Orpah Greek, MD  methocarbamol (ROBAXIN) 500 MG tablet Take 1  tablet (500 mg total) by mouth 2 (two) times daily. Patient not taking: Reported on 09/09/2017 01/23/17   Nona Dell, PA-C  naproxen (NAPROSYN) 500 MG tablet Take 1 tablet (500 mg total) by mouth 2 (two) times daily. 07/25/17   Fredia Sorrow, MD    ALLERGIES:  Allergies  Allergen Reactions  . Codeine Anaphylaxis  . Amoxicillin Rash    Has patient had a PCN reaction causing immediate rash, facial/tongue/throat swelling, SOB or lightheadedness with hypotension: no Has patient had a PCN reaction causing severe rash involving mucus membranes or skin necrosis: no Has patient had a PCN reaction that required hospitalization no Has patient had a PCN reaction occurring within the last 10 years: yes If all of the above answers are "NO", then may proceed with Cephalosporin use.   Marland Kitchen Doxycycline Rash    SOCIAL HISTORY:  Social History   Tobacco Use  . Smoking status: Former Smoker    Packs/day: 0.50    Years: 20.00    Pack years: 10.00    Types: Cigarettes    Last attempt to quit: 08/13/2016    Years since quitting: 1.3  . Smokeless tobacco: Never Used  Substance Use Topics  . Alcohol use: Yes    Comment: occasionally    FAMILY HISTORY: Family History  Problem Relation Age of Onset  . Heart disease Mother   . Anxiety disorder Mother   . Depression Mother   . Stomach cancer Father   . Heart disease Father   . Other Father        non hodgkins lymphoma  . Alcohol abuse Maternal Aunt   . Drug abuse Maternal Aunt   . Colon cancer Neg Hx     EXAM: BP 128/90   Pulse 73   Temp 97.9 F (36.6 C)   Resp 18   Ht 5\' 6"  (1.676 m)   Wt 97.5 kg (215 lb)   SpO2 100%   BMI 34.70 kg/m  CONSTITUTIONAL: Alert and oriented and responds appropriately to questions. Well-appearing; well-nourished HEAD: Normocephalic EYES: Conjunctivae clear, pupils appear equal, EOMI ENT: normal nose; moist mucous membranes; No pharyngeal erythema or petechiae, no tonsillar hypertrophy or  exudate, no uvular deviation, no unilateral swelling, no trismus or drooling, no muffled voice, normal phonation, no stridor, no dental caries present, no drainable dental abscess noted, no Ludwig's angina, tongue sits flat in the bottom of the mouth, no angioedema, no facial erythema or warmth, no facial swelling; no pain with movement of the neck.  TMs are clear bilaterally without erythema, purulence, bulging, perforation, effusion.  No cerumen impaction or sign of foreign body in the external auditory canal. No inflammation, erythema or drainage from the external auditory canal. No signs of mastoiditis. No pain with manipulation of the pinna bilaterally.  NECK: Supple, no meningismus, no nuchal rigidity, no LAD  CARD: RRR; S1 and S2 appreciated;  no murmurs, no clicks, no rubs, no gallops RESP: Normal chest excursion without splinting or tachypnea; breath sounds clear and equal bilaterally; no wheezes, no rhonchi, no rales, no hypoxia or respiratory distress, speaking full sentences ABD/GI: Normal bowel sounds; non-distended; soft, non-tender, no rebound, no guarding, no peritoneal signs, no hepatosplenomegaly BACK:  The back appears normal and is non-tender to palpation, there is no CVA tenderness EXT: Normal ROM in all joints; non-tender to palpation; no edema; normal capillary refill; no cyanosis, no calf tenderness or swelling    SKIN: Normal color for age and race; warm; no rash NEURO: Moves all extremities equally, normal sensation diffusely, cranial nerves II through XII intact, normal speech, normal gait PSYCH: The patient's mood and manner are appropriate. Grooming and personal hygiene are appropriate.  MEDICAL DECISION MAKING: Patient here with multiple different complaints.  I suspect that she is having a viral illness causing her symptoms.  Was flu negative with her PCP several days ago.  Now outside of any treatment window for Tamiflu.  She does describe exertional chest pain but does not  have any risk factors for ACS.  She is PERC negative.  No pain currently.  Doubt dissection.  Will obtain cardiac labs, chest x-ray.  Patient also complaining of some right flank pain that is not reproducible with palpation.  No injury to her back and no focal neurologic deficits.  She is not having urinary symptoms but will send urinalysis.  Complaining of posterior headache.  Has been having headaches intermittently for several weeks.  Has an MRI scheduled as an outpatient next week.  No focal neurologic deficits a day, meningismus, fever.  Doubt stroke, intracranial hemorrhage, meningitis.  I do not feel she needs emergent imaging.  Will treat headache with migraine cocktail.  ED PROGRESS: Patient reports feeling much better.  Headache gone.  Still not having any chest pain.  Labs unremarkable including no leukocytosis, normal hemoglobin, normal electrolytes.  Negative troponin.  Chest x-ray is clear without infiltrate, edema, pneumothorax.  Urine shows no sign of infection or dehydration.  Suspect viral illness.  I am not concerned for any life-threatening process at this time.  Have recommended follow-up with her PCP.  Recommended alternating Tylenol and Motrin, rest, increase fluid intake.  Will discharge with prescription for Phenergan to take as needed for headaches, nausea.  At this time, I do not feel there is any life-threatening condition present. I have reviewed and discussed all results (EKG, imaging, lab, urine as appropriate) and exam findings with patient/family. I have reviewed nursing notes and appropriate previous records.  I feel the patient is safe to be discharged home without further emergent workup and can continue workup as an outpatient as needed. Discussed usual and customary return precautions. Patient/family verbalize understanding and are comfortable with this plan.  Outpatient follow-up has been provided if needed. All questions have been answered.      EKG  Interpretation  Date/Time:  Sunday December 11 2017 04:22:27 EDT Ventricular Rate:  68 PR Interval:    QRS Duration: 89 QT Interval:  431 QTC Calculation: 459 R Axis:   18 Text Interpretation:  Sinus rhythm Probable left atrial enlargement No significant change since last tracing Confirmed by Sohaib Vereen, Cyril Mourning [phone]) on 12/11/2017 4:27:11 AM          Tyleah Loh, Delice Bison, DO 12/11/17 [id number]

## 2017-12-11 NOTE — ED Notes (Signed)
ED Provider at bedside. 

## 2017-12-11 NOTE — ED Triage Notes (Addendum)
Pt states that she has not been feeling well for the past week. Has been treated with a zpack this week but states not better. States one day left of antibiotic. C/o feeing congested. C/o posterior headaches since Thursday. States they are getting progressively worse. States she has an mri scheduled on Wednesday. C/o fevers the last couple nights. C/o left "lymph nodes" hurting. resp even and unlabored. States she feels a little sob with exertion. Has taken ibuprofen and tylenol without relief.

## 2017-12-13 ENCOUNTER — Other Ambulatory Visit: Payer: Self-pay

## 2017-12-13 ENCOUNTER — Encounter (HOSPITAL_BASED_OUTPATIENT_CLINIC_OR_DEPARTMENT_OTHER): Payer: Self-pay

## 2017-12-13 ENCOUNTER — Emergency Department (HOSPITAL_BASED_OUTPATIENT_CLINIC_OR_DEPARTMENT_OTHER): Payer: Managed Care, Other (non HMO)

## 2017-12-13 ENCOUNTER — Emergency Department (HOSPITAL_BASED_OUTPATIENT_CLINIC_OR_DEPARTMENT_OTHER)
Admission: EM | Admit: 2017-12-13 | Discharge: 2017-12-13 | Disposition: A | Discharge status: Home / Self Care | Payer: Managed Care, Other (non HMO) | Attending: Emergency Medicine | Admitting: Emergency Medicine

## 2017-12-13 DIAGNOSIS — Z79899 Other long term (current) drug therapy: Secondary | ICD-10-CM | POA: Diagnosis not present

## 2017-12-13 DIAGNOSIS — Z87891 Personal history of nicotine dependence: Secondary | ICD-10-CM | POA: Insufficient documentation

## 2017-12-13 DIAGNOSIS — G44209 Tension-type headache, unspecified, not intractable: Secondary | ICD-10-CM

## 2017-12-13 DIAGNOSIS — I10 Essential (primary) hypertension: Secondary | ICD-10-CM | POA: Insufficient documentation

## 2017-12-13 DIAGNOSIS — M542 Cervicalgia: Secondary | ICD-10-CM | POA: Insufficient documentation

## 2017-12-13 DIAGNOSIS — R51 Headache: Secondary | ICD-10-CM | POA: Diagnosis present

## 2017-12-13 MED ORDER — METOCLOPRAMIDE HCL 10 MG PO TABS: 10.0000 mg | ORAL_TABLET | Freq: Four times a day (QID) | ORAL | 0 refills | Status: DC | PRN

## 2017-12-13 MED ORDER — ORPHENADRINE CITRATE ER 100 MG PO TB12: 100.0000 mg | ORAL_TABLET | Freq: Two times a day (BID) | ORAL | 0 refills | Status: DC

## 2017-12-13 MED ORDER — SODIUM CHLORIDE 0.9 % IV BOLUS (SEPSIS)
1000.0000 mL | Freq: Once | INTRAVENOUS | Status: AC
  Administered 2017-12-13: 1000 mL via INTRAVENOUS

## 2017-12-13 MED ORDER — DIPHENHYDRAMINE HCL 50 MG/ML IJ SOLN
25.0000 mg | Freq: Once | INTRAMUSCULAR | Status: AC
  Administered 2017-12-13: 25 mg via INTRAVENOUS
  Filled 2017-12-13: qty 1

## 2017-12-13 MED ORDER — METOCLOPRAMIDE HCL 5 MG/ML IJ SOLN
10.0000 mg | Freq: Once | INTRAMUSCULAR | Status: AC
  Administered 2017-12-13: 10 mg via INTRAVENOUS
  Filled 2017-12-13: qty 2

## 2017-12-13 MED ORDER — DEXAMETHASONE SODIUM PHOSPHATE 10 MG/ML IJ SOLN
10.0000 mg | Freq: Once | INTRAMUSCULAR | Status: AC
  Administered 2017-12-13: 10 mg via INTRAVENOUS
  Filled 2017-12-13: qty 1

## 2017-12-13 MED FILL — ORPHENADRINE 100 MG TAB SA: 100 | 15 days supply | Qty: 30 | Fill #0

## 2017-12-13 MED FILL — METOCLOPRAMIDE 10 MG TABLET: 10 | 8 days supply | Qty: 30 | Fill #0

## 2017-12-13 NOTE — ED Provider Notes (Signed)
[facility] EMERGENCY DEPARTMENT Provider Note   CSN: [id number] Arrival date & time: 12/13/17  0027     History   Chief Complaint Chief Complaint  Patient presents with  . Neck Pain  . Headache    HPI Lori Jensen is a 46 y.o. female.  The history is provided by the patient.  Neck Pain   Associated symptoms include headaches.  Headache    She has history of anxiety, depression, hypertension, heart murmur and comes in complaining of left occipital headache.  She has been sick with a flulike illness for about the last 2 weeks.  She was initially treated by her PCP with azithromycin with no benefit.  She was seen in the ED 2 days ago and given a migraine cocktail which did give temporary relief, but headache recurred when she woke up the next day.  Pain starts at the base of the skull on the left and radiates up to the frontal area.  She has difficulty describing the pain, but rates it at 7/10.  It is worse with certain head movements.  There is no visual disturbance, nausea, vomiting.  She denies photophobia or phonophobia.  She denies weakness, numbness, tingling.  She is taken ibuprofen, naproxen, acetaminophen, meloxicam without benefit.  She is tried warm compresses and cool compresses without relief.  Although she had a low-grade fever initially, she has not had a fever for the last week.  Past Medical History:  Diagnosis Date  . Anxiety   . Depression   . Dysmenorrhea   . Heart murmur   . Hypertension   . PONV (postoperative nausea and vomiting)     Patient Active Problem List   Diagnosis Date Noted  . SMOKER 07/23/2009  . PALPITATIONS 06/12/2009  . PANIC ATTACKS 11/24/2006  . DEPRESSIVE DISORDER, NOS 11/24/2006  . MITRAL VALVE PROLAPSE 11/24/2006  . CHEST PAIN 11/24/2006    Past Surgical History:  Procedure Laterality Date  . DILATION AND CURETTAGE OF UTERUS    . DILATION AND CURETTAGE OF UTERUS  01/16/2013   Procedure: DILATATION AND CURETTAGE;   Surgeon: Gus Height, MD;  Location: [facility];  Service: Gynecology;;  . ENDOMETRIAL ABLATION    . uterine ablation  08/13/2011    OB History    Gravida Para Term Preterm AB Living   7 2 2   5 2    SAB TAB Ectopic Multiple Live Births   3 2             Home Medications    Prior to Admission medications   Medication Sig Start Date End Date Taking? Authorizing Provider  ALBUTEROL IN Inhale 2 puffs into the lungs every 6 (six) hours as needed (wheezing).     [provider]  bisacodyl (BISACODYL) 5 MG EC tablet Take 1 tablet (5 mg total) by mouth daily as needed for moderate constipation. 11/17/16   Constant, Peggy, MD  buPROPion (WELLBUTRIN XL) 150 MG 24 hr tablet Take 1 tablet (150 mg total) by mouth every morning. 10/14/17 10/14/18  Clarene Reamer, MD  buPROPion (WELLBUTRIN XL) 300 MG 24 hr tablet Take 1 tablet (300 mg total) by mouth every morning. 10/14/17 10/14/18  Clarene Reamer, MD  clonazePAM (KLONOPIN) 1 MG tablet Take 1 tablet (1 mg total) by mouth 3 (three) times daily as needed for anxiety. 09/13/17 09/13/18  Clarene Reamer, MD  DULoxetine (CYMBALTA) 30 MG capsule Take 1 capsule (30 mg total) by mouth daily. 09/13/17  Clarene Reamer, MD  hydrochlorothiazide (HYDRODIURIL) 25 MG tablet Take 25 mg by mouth daily.  01/31/13   [provider]  HYDROcodone-acetaminophen (NORCO/VICODIN) 5-325 MG tablet Take 1-2 tablets by mouth every 6 (six) hours as needed. Patient not taking: Reported on 09/09/2017 03/11/17   Veryl Speak, MD  ibuprofen (ADVIL,MOTRIN) 600 MG tablet Take 1 tablet (600 mg total) by mouth every 6 (six) hours as needed. 01/23/17   Nona Dell, PA-C  lisinopril (PRINIVIL,ZESTRIL) 10 MG tablet Take 10 mg by mouth daily.    [provider]  meloxicam (MOBIC) 15 MG tablet Take 1 tablet (15 mg total) by mouth daily. Patient not taking: Reported on 09/09/2017 05/25/17   Orpah Greek, MD  methocarbamol (ROBAXIN) 500 MG tablet  Take 1 tablet (500 mg total) by mouth 2 (two) times daily. Patient not taking: Reported on 09/09/2017 01/23/17   Nona Dell, PA-C  naproxen (NAPROSYN) 500 MG tablet Take 1 tablet (500 mg total) by mouth 2 (two) times daily. 07/25/17   Fredia Sorrow, MD  promethazine (PHENERGAN) 25 MG tablet Take 1 tablet (25 mg total) by mouth every 6 (six) hours as needed for nausea or vomiting. 12/11/17   Ward, Delice Bison, DO    Family History Family History  Problem Relation Age of Onset  . Heart disease Mother   . Anxiety disorder Mother   . Depression Mother   . Stomach cancer Father   . Heart disease Father   . Other Father        non hodgkins lymphoma  . Alcohol abuse Maternal Aunt   . Drug abuse Maternal Aunt   . Colon cancer Neg Hx     Social History Social History   Tobacco Use  . Smoking status: Former Smoker    Packs/day: 0.50    Years: 20.00    Pack years: 10.00    Types: Cigarettes    Last attempt to quit: 08/13/2016    Years since quitting: 1.3  . Smokeless tobacco: Never Used  Substance Use Topics  . Alcohol use: Yes    Comment: occasionally  . Drug use: No     Allergies   Codeine; Amoxicillin; and Doxycycline   Review of Systems Review of Systems  Musculoskeletal: Positive for neck pain.  Neurological: Positive for headaches.  All other systems reviewed and are negative.    Physical Exam Updated Vital Signs BP (!) 158/107 (BP Location: Left Arm)   Pulse 80   Temp 98.2 F (36.8 C) (Oral)   Resp 18   SpO2 97%   Physical Exam  Nursing note and vitals reviewed.   47 year old female, resting comfortably and in no acute distress. Vital signs are significant for elevated blood pressure. Oxygen saturation is 97%, which is normal. Head is normocephalic and atraumatic. PERRLA, EOMI. Oropharynx is clear. Neck is supple without adenopathy or JVD.  There is tenderness to palpation over the insertion of the left paracervical muscles.  Pain is elicited  with rotation of the head to the right and with flexion of the head.  Negative Kernig's and Brudzinski's signs. Back is nontender and there is no CVA tenderness. Lungs are clear without rales, wheezes, or rhonchi. Chest is nontender. Heart has regular rate and rhythm without murmur. Abdomen is soft, flat, nontender without masses or hepatosplenomegaly and peristalsis is normoactive. Extremities have trace edema, full range of motion is present. Skin is warm and dry without rash. Neurologic: Mental status is normal, cranial nerves are  intact, there are no motor or sensory deficits.  ED Treatments / Results  Labs (all labs ordered are listed, but only abnormal results are displayed) Labs Reviewed - No data to display  Radiology No results found.  Procedures Procedures   Medications Ordered in ED Medications  sodium chloride 0.9 % bolus 1,000 mL (not administered)  metoCLOPramide (REGLAN) injection 10 mg (not administered)  diphenhydrAMINE (BENADRYL) injection 25 mg (not administered)  dexamethasone (DECADRON) injection 10 mg (not administered)     Initial Impression / Assessment and Plan / ED Course  I have reviewed the triage vital signs and the nursing notes.  Pertinent imaging results that were available during my care of the patient were reviewed by me and considered in my medical decision making (see chart for details).  Headache which appears to be muscle contraction headache based on exam findings.  No meningismus to suggest meningitis.  No photophobia, nausea, vomiting to suggest migraine.  Old records are reviewed, and she was seen in the ED 2 days ago at which time she had negative laboratory workup and had good relief with headache cocktail of normal saline, metoclopramide, diphenhydramine, and ketorolac.  No red flags to suggest more serious causes of headache today, but will send for CT scan since this is unusual for the patient.  We will repeat normal saline,  metoclopramide, diphenhydramine.  Will hold ketorolac initially, but will give dexamethasone to try to prevent rebound headache.  She had excellent relief of her headache with above-noted treatment.  She is discharged with prescription for metoclopramide and orphenadrine, advised to try applying ice to the back of her head and neck instead of just cool compresses.  Follow-up with PCP.  Return precautions discussed.  Final Clinical Impressions(s) / ED Diagnoses   Final diagnoses:  Muscle contraction headache    ED Discharge Orders        Ordered    metoCLOPramide (REGLAN) 10 MG tablet  Every 6 hours PRN     12/13/17 0726    orphenadrine (NORFLEX) 100 MG tablet  2 times daily     54/65/68 1275       Delora Fuel, MD 17/00/17 [phone]

## 2017-12-13 NOTE — ED Notes (Signed)
Patient transported to CT 

## 2017-12-13 NOTE — Discharge Instructions (Signed)
In addition to the medication prescribed, try applying an ice pack to the back of your head and neck if the headache comes back.

## 2017-12-13 NOTE — ED Triage Notes (Signed)
Pt reports 10/10 neck pain w/ difficulty turning her head and worsening headache pain. Pt A+OX4, ambulatory, NAD.

## 2017-12-22 ENCOUNTER — Encounter (HOSPITAL_COMMUNITY): Payer: Self-pay | Admitting: Psychiatry

## 2017-12-22 ENCOUNTER — Ambulatory Visit (INDEPENDENT_AMBULATORY_CARE_PROVIDER_SITE_OTHER): Payer: 59 | Admitting: Psychiatry

## 2017-12-22 DIAGNOSIS — R450 Nervousness: Secondary | ICD-10-CM

## 2017-12-22 DIAGNOSIS — F4312 Post-traumatic stress disorder, chronic: Secondary | ICD-10-CM

## 2017-12-22 DIAGNOSIS — Z62811 Personal history of psychological abuse in childhood: Secondary | ICD-10-CM | POA: Diagnosis not present

## 2017-12-22 DIAGNOSIS — Z818 Family history of other mental and behavioral disorders: Secondary | ICD-10-CM

## 2017-12-22 DIAGNOSIS — F419 Anxiety disorder, unspecified: Secondary | ICD-10-CM

## 2017-12-22 DIAGNOSIS — Z811 Family history of alcohol abuse and dependence: Secondary | ICD-10-CM

## 2017-12-22 DIAGNOSIS — M542 Cervicalgia: Secondary | ICD-10-CM

## 2017-12-22 DIAGNOSIS — Z87891 Personal history of nicotine dependence: Secondary | ICD-10-CM | POA: Diagnosis not present

## 2017-12-22 DIAGNOSIS — Z813 Family history of other psychoactive substance abuse and dependence: Secondary | ICD-10-CM | POA: Diagnosis not present

## 2017-12-22 DIAGNOSIS — M549 Dorsalgia, unspecified: Secondary | ICD-10-CM

## 2017-12-22 DIAGNOSIS — Z62810 Personal history of physical and sexual abuse in childhood: Secondary | ICD-10-CM | POA: Diagnosis not present

## 2017-12-22 DIAGNOSIS — G4700 Insomnia, unspecified: Secondary | ICD-10-CM

## 2017-12-22 DIAGNOSIS — F332 Major depressive disorder, recurrent severe without psychotic features: Secondary | ICD-10-CM | POA: Diagnosis not present

## 2017-12-22 DIAGNOSIS — F4010 Social phobia, unspecified: Secondary | ICD-10-CM

## 2017-12-22 MED ORDER — BUPROPION HCL ER (XL) 300 MG PO TB24: 300.0000 mg | ORAL_TABLET | ORAL | 0 refills | Status: DC

## 2017-12-22 MED ORDER — CLONAZEPAM 0.5 MG PO TABS: ORAL_TABLET | ORAL | 0 refills | Status: DC

## 2017-12-22 MED ORDER — DULOXETINE HCL 60 MG PO CPEP: 60.0000 mg | ORAL_CAPSULE | Freq: Every day | ORAL | 0 refills | Status: DC

## 2017-12-22 MED ORDER — TRAZODONE HCL 100 MG PO TABS: 100.0000 mg | ORAL_TABLET | Freq: Every day | ORAL | 0 refills | Status: DC
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
## 2018-01-02 ENCOUNTER — Emergency Department (HOSPITAL_COMMUNITY): Payer: Managed Care, Other (non HMO)

## 2018-01-02 ENCOUNTER — Other Ambulatory Visit: Payer: Self-pay

## 2018-01-02 ENCOUNTER — Emergency Department (HOSPITAL_COMMUNITY)
Admission: EM | Admit: 2018-01-02 | Discharge: 2018-01-02 | Disposition: A | Discharge status: Home / Self Care | Payer: Managed Care, Other (non HMO) | Attending: Emergency Medicine | Admitting: Emergency Medicine

## 2018-01-02 ENCOUNTER — Encounter (HOSPITAL_COMMUNITY): Payer: Self-pay

## 2018-01-02 ENCOUNTER — Telehealth (HOSPITAL_COMMUNITY): Payer: Self-pay

## 2018-01-02 DIAGNOSIS — Z87891 Personal history of nicotine dependence: Secondary | ICD-10-CM | POA: Insufficient documentation

## 2018-01-02 DIAGNOSIS — Y69 Unspecified misadventure during surgical and medical care: Secondary | ICD-10-CM | POA: Insufficient documentation

## 2018-01-02 DIAGNOSIS — I10 Essential (primary) hypertension: Secondary | ICD-10-CM | POA: Diagnosis not present

## 2018-01-02 DIAGNOSIS — Z79899 Other long term (current) drug therapy: Secondary | ICD-10-CM | POA: Insufficient documentation

## 2018-01-02 DIAGNOSIS — T50905A Adverse effect of unspecified drugs, medicaments and biological substances, initial encounter: Secondary | ICD-10-CM | POA: Diagnosis not present

## 2018-01-02 DIAGNOSIS — T887XXA Unspecified adverse effect of drug or medicament, initial encounter: Secondary | ICD-10-CM | POA: Diagnosis not present

## 2018-01-02 DIAGNOSIS — R0789 Other chest pain: Secondary | ICD-10-CM | POA: Diagnosis present

## 2018-01-02 LAB — BASIC METABOLIC PANEL
Anion gap: 10 (ref 5–15)
BUN: 9 mg/dL (ref 6–20)
CO2: 24 mmol/L (ref 22–32)
Calcium: 9.2 mg/dL (ref 8.9–10.3)
Chloride: 103 mmol/L (ref 101–111)
Creatinine, Ser: 0.97 mg/dL (ref 0.44–1.00)
Glucose, Bld: 108 mg/dL — ABNORMAL HIGH (ref 65–99)
Potassium: 3.5 mmol/L (ref 3.5–5.1)
SODIUM: 137 mmol/L (ref 135–145)

## 2018-01-02 LAB — I-STAT TROPONIN, ED
TROPONIN I, POC: 0 ng/mL (ref 0.00–0.08)
Troponin i, poc: 0 ng/mL (ref 0.00–0.08)

## 2018-01-02 LAB — CBC
HCT: 40.8 % (ref 36.0–46.0)
Hemoglobin: 13.5 g/dL (ref 12.0–15.0)
MCH: 27.5 pg (ref 26.0–34.0)
MCHC: 33.1 g/dL (ref 30.0–36.0)
MCV: 83.1 fL (ref 78.0–100.0)
PLATELETS: 230 10*3/uL (ref 150–400)
RBC: 4.91 MIL/uL (ref 3.87–5.11)
RDW: 13.2 % (ref 11.5–15.5)
WBC: 6.8 10*3/uL (ref 4.0–10.5)

## 2018-01-02 LAB — I-STAT BETA HCG BLOOD, ED (MC, WL, AP ONLY)

## 2018-01-02 MED ORDER — METHOCARBAMOL 500 MG PO TABS: 500.0000 mg | ORAL_TABLET | Freq: Two times a day (BID) | ORAL | 0 refills | Status: DC

## 2018-01-02 MED ORDER — KETOROLAC TROMETHAMINE 30 MG/ML IJ SOLN
30.0000 mg | Freq: Once | INTRAMUSCULAR | Status: AC
  Administered 2018-01-02: 30 mg via INTRAMUSCULAR
  Filled 2018-01-02: qty 1

## 2018-01-02 NOTE — ED Triage Notes (Signed)
Patient c/o CP that that lasted only a few seconds and felt like an electrical shock. States that she is having residual pain; level 4/10.  Also, c/o weakness in both arms - has a neuro appt next week.

## 2018-01-02 NOTE — ED Provider Notes (Signed)
[facility] DEPARTMENT Provider Note   CSN: [id number] Arrival date & time: 01/02/18  0141     History   Chief Complaint Chief Complaint  Patient presents with  . Chest Pain    HPI Lori Jensen is a 47 y.o. female.  The history is provided by the patient. No language interpreter was used.  Chest Pain   This is a new problem. The current episode started 6 to 12 hours ago. The problem occurs constantly. The problem has not changed since onset.The pain is associated with rest. The pain is present in the substernal region. The pain is moderate. Quality: electric shock then achy. The pain does not radiate. Exacerbated by: none. Pertinent negatives include no abdominal pain, no numbness, no orthopnea, no palpitations, no PND and no shortness of breath. She has tried nothing for the symptoms. The treatment provided no relief. There are no known risk factors.  Pertinent negatives for past medical history include no DVT, no MI and no PE.  Pertinent negatives for family medical history include: no Marfan's syndrome.  Procedure history is negative for cardiac catheterization and echocardiogram.  Cymbalta is being titrated up and is still on Wellbutrin.  No OCP no long car trips or plane trips.  No leg pain.    Past Medical History:  Diagnosis Date  . Anxiety   . Depression   . Dysmenorrhea   . Heart murmur   . Hypertension   . PONV (postoperative nausea and vomiting)     Patient Active Problem List   Diagnosis Date Noted  . SMOKER 07/23/2009  . PALPITATIONS 06/12/2009  . PANIC ATTACKS 11/24/2006  . DEPRESSIVE DISORDER, NOS 11/24/2006  . MITRAL VALVE PROLAPSE 11/24/2006  . CHEST PAIN 11/24/2006    Past Surgical History:  Procedure Laterality Date  . DILATION AND CURETTAGE OF UTERUS    . DILATION AND CURETTAGE OF UTERUS  01/16/2013   Procedure: DILATATION AND CURETTAGE;  Surgeon: Gus Height, MD;  Location: [facility];  Service: Gynecology;;  .  ENDOMETRIAL ABLATION    . uterine ablation  08/13/2011     OB History    Gravida  7   Para  2   Term  2   Preterm      AB  5   Living  2     SAB  3   TAB  2   Ectopic      Multiple      Live Births               Home Medications    Prior to Admission medications   Medication Sig Start Date End Date Taking? Authorizing Provider  ALBUTEROL IN Inhale 2 puffs into the lungs every 6 (six) hours as needed (wheezing).    Yes [provider]  buPROPion (WELLBUTRIN XL) 300 MG 24 hr tablet Take 1 tablet (300 mg total) by mouth every morning. 12/22/17 12/22/18 Yes Eksir, Richard Miu, MD  clonazePAM Bobbye Charleston) 0.5 MG tablet Take 1-2 tablets in the evening for anxiety and sleep 12/22/17  Yes Eksir, Richard Miu, MD  DULoxetine (CYMBALTA) 60 MG capsule Take 1 capsule (60 mg total) by mouth daily. 12/22/17  Yes Eksir, Richard Miu, MD  hydrochlorothiazide (HYDRODIURIL) 25 MG tablet Take 25 mg by mouth daily.  01/31/13  Yes [provider]  lisinopril (PRINIVIL,ZESTRIL) 10 MG tablet Take 10 mg by mouth daily.   Yes [provider]  traZODone (DESYREL) 100 MG tablet Take 1 tablet (  100 mg total) by mouth at bedtime. 12/22/17  Yes Eksir, Richard Miu, MD  bisacodyl (BISACODYL) 5 MG EC tablet Take 1 tablet (5 mg total) by mouth daily as needed for moderate constipation. Patient not taking: Reported on 01/02/2018 11/17/16   Constant, Peggy, MD  ibuprofen (ADVIL,MOTRIN) 600 MG tablet Take 1 tablet (600 mg total) by mouth every 6 (six) hours as needed. Patient not taking: Reported on 01/02/2018 01/23/17   Nona Dell, PA-C  meloxicam (MOBIC) 15 MG tablet Take 1 tablet (15 mg total) by mouth daily. Patient not taking: Reported on 01/02/2018 05/25/17   Orpah Greek, MD  methocarbamol (ROBAXIN) 500 MG tablet Take 1 tablet (500 mg total) by mouth 2 (two) times daily. Patient not taking: Reported on 01/02/2018 01/23/17   Nona Dell, PA-C    naproxen (NAPROSYN) 500 MG tablet Take 1 tablet (500 mg total) by mouth 2 (two) times daily. Patient not taking: Reported on 01/02/2018 07/25/17   Fredia Sorrow, MD  promethazine (PHENERGAN) 25 MG tablet Take 1 tablet (25 mg total) by mouth every 6 (six) hours as needed for nausea or vomiting. Patient not taking: Reported on 01/02/2018 12/11/17   Ward, Delice Bison, DO    Family History Family History  Problem Relation Age of Onset  . Heart disease Mother   . Anxiety disorder Mother   . Depression Mother   . Stomach cancer Father   . Heart disease Father   . Other Father        non hodgkins lymphoma  . Alcohol abuse Maternal Aunt   . Drug abuse Maternal Aunt   . Colon cancer Neg Hx     Social History Social History   Tobacco Use  . Smoking status: Former Smoker    Years: 20.00    Types: Cigarettes    Last attempt to quit: 08/13/2016    Years since quitting: 1.3  . Smokeless tobacco: Never Used  Substance Use Topics  . Alcohol use: Not Currently    Comment: occasionally  . Drug use: No     Allergies   Codeine; Amoxicillin; and Doxycycline   Review of Systems Review of Systems  Respiratory: Negative for shortness of breath.   Cardiovascular: Positive for chest pain. Negative for palpitations, orthopnea, leg swelling and PND.  Gastrointestinal: Negative for abdominal pain.  Neurological: Negative for numbness.  All other systems reviewed and are negative.    Physical Exam Updated Vital Signs BP 118/84 (BP Location: Right Arm)   Pulse 88   Temp 97.9 F (36.6 C) (Oral)   Resp 16   Ht 5\' 6"  (1.676 m)   Wt 97.5 kg (215 lb)   LMP 10/12/2017 (Approximate)   SpO2 100%   BMI 34.70 kg/m   Physical Exam  Constitutional: She is oriented to person, place, and time. She appears well-developed and well-nourished.  HENT:  Head: Normocephalic and atraumatic.  Mouth/Throat: No oropharyngeal exudate.  Eyes: Pupils are equal, round, and reactive to light. Conjunctivae  are normal.  Neck: Normal range of motion. Neck supple.  Cardiovascular: Normal rate, regular rhythm, normal heart sounds and intact distal pulses.  Pulmonary/Chest: Effort normal and breath sounds normal. No stridor. She has no wheezes. She has no rales.  Abdominal: Soft. Bowel sounds are normal. She exhibits no mass. There is no tenderness. There is no rebound and no guarding.  Musculoskeletal: She exhibits no tenderness or deformity.  Neurological: She is alert and oriented to person, place, and time. She displays normal  reflexes.  Skin: Skin is warm and dry. Capillary refill takes less than 2 seconds.  Psychiatric: She has a normal mood and affect.     ED Treatments / Results  Labs (all labs ordered are listed, but only abnormal results are displayed)  Results for orders placed or performed during the hospital encounter of 29/52/84  Basic metabolic panel  Result Value Ref Range   Sodium 137 135 - 145 mmol/L   Potassium 3.5 3.5 - 5.1 mmol/L   Chloride 103 101 - 111 mmol/L   CO2 24 22 - 32 mmol/L   Glucose, Bld 108 (H) 65 - 99 mg/dL   BUN 9 6 - 20 mg/dL   Creatinine, Ser 0.97 0.44 - 1.00 mg/dL   Calcium 9.2 8.9 - 10.3 mg/dL   GFR calc non Af Amer >60 >60 mL/min   GFR calc Af Amer >60 >60 mL/min   Anion gap 10 5 - 15  CBC  Result Value Ref Range   WBC 6.8 4.0 - 10.5 K/uL   RBC 4.91 3.87 - 5.11 MIL/uL   Hemoglobin 13.5 12.0 - 15.0 g/dL   HCT 40.8 36.0 - 46.0 %   MCV 83.1 78.0 - 100.0 fL   MCH 27.5 26.0 - 34.0 pg   MCHC 33.1 30.0 - 36.0 g/dL   RDW 13.2 11.5 - 15.5 %   Platelets 230 150 - 400 K/uL  I-stat troponin, ED  Result Value Ref Range   Troponin i, poc 0.00 0.00 - 0.08 ng/mL   Comment 3          I-Stat beta hCG blood, ED  Result Value Ref Range   I-stat hCG, quantitative <5.0 <5 mIU/mL   Comment 3          I-stat troponin, ED  Result Value Ref Range   Troponin i, poc 0.00 0.00 - 0.08 ng/mL   Comment 3           Dg Chest 2 View  Result Date:  01/02/2018 CLINICAL DATA:  Mid and bilateral chest pain tonight radiating down both arms. EXAM: CHEST - 2 VIEW COMPARISON:  12/11/2017 FINDINGS: Linear shadows in the base of the neck may represent fat shadows or artifact from hair. Subcutaneous emphysema is not excluded. Suggest follow-up soft tissue neck views for further evaluation. Normal heart size and pulmonary vascularity. No focal airspace disease or consolidation in the lungs. No blunting of costophrenic angles. No pneumothorax. Mediastinal contours appear intact. IMPRESSION: 1. No evidence of active pulmonary disease. 2. Linear lucency shadows over the base of the neck bilaterally may represent normal variation or artifact but subcutaneous emphysema is not excluded. Suggest follow-up soft tissue neck views for further evaluation. Electronically Signed   By: Lucienne Capers M.D.   On: 01/02/2018 02:07   Dg Chest 2 View  Result Date: 12/11/2017 CLINICAL DATA:  Chest pain.  Cough and congestion. EXAM: CHEST - 2 VIEW COMPARISON:  Radiographs 07/25/2017 FINDINGS: The cardiomediastinal contours are normal. The lungs are clear. Pulmonary vasculature is normal. No consolidation, pleural effusion, or pneumothorax. No acute osseous abnormalities are seen. IMPRESSION: No active cardiopulmonary disease. Electronically Signed   By: Jeb Levering M.D.   On: 12/11/2017 04:29   Ct Head Wo Contrast  Result Date: 12/13/2017 CLINICAL DATA:  Headache, acute, normal neuro exam EXAM: CT HEAD WITHOUT CONTRAST TECHNIQUE: Contiguous axial images were obtained from the base of the skull through the vertex without intravenous contrast. COMPARISON:  Head CT 03/19/2015 FINDINGS: Brain: No intracranial hemorrhage,  mass effect, or midline shift. No hydrocephalus. The basilar cisterns are patent. No evidence of territorial infarct or acute ischemia. No extra-axial or intracranial fluid collection. Vascular: No hyperdense vessel or unexpected calcification. Skull: No fracture  or focal lesion. Sinuses/Orbits: Opacification of a few right ethmoid air cells. No sinus fluid levels. Mastoid air cells are clear. Other: None. IMPRESSION: 1.  No acute intracranial abnormality. 2. Mild right ethmoid sinus disease, possibly acute. Electronically Signed   By: Jeb Levering M.D.   On: 12/13/2017 06:13    EKG EKG Interpretation  Date/Time:  Monday Dionte Bla[place] 08 2019 01:46:38 EDT Ventricular Rate:  79 PR Interval:  136 QRS Duration: 78 QT Interval:  382 QTC Calculation: 438 R Axis:   38 Text Interpretation:  Normal sinus rhythm Confirmed by Randal Buba, Tino Ronan ([id number]) on 01/02/2018 4:44:45 AM   Radiology Dg Chest 2 View  Result Date: 01/02/2018 CLINICAL DATA:  Mid and bilateral chest pain tonight radiating down both arms. EXAM: CHEST - 2 VIEW COMPARISON:  12/11/2017 FINDINGS: Linear shadows in the base of the neck may represent fat shadows or artifact from hair. Subcutaneous emphysema is not excluded. Suggest follow-up soft tissue neck views for further evaluation. Normal heart size and pulmonary vascularity. No focal airspace disease or consolidation in the lungs. No blunting of costophrenic angles. No pneumothorax. Mediastinal contours appear intact. IMPRESSION: 1. No evidence of active pulmonary disease. 2. Linear lucency shadows over the base of the neck bilaterally may represent normal variation or artifact but subcutaneous emphysema is not excluded. Suggest follow-up soft tissue neck views for further evaluation. Electronically Signed   By: Lucienne Capers M.D.   On: 01/02/2018 02:07    Procedures Procedures (including critical care time)  Medications Ordered in ED Medications  ketorolac (TORADOL) 30 MG/ML injection 30 mg (has no administration in time range)     PERC negative wells 0 highly doubt PE in this low risk patient.  Ruled out for MI in the ED heart score is 1 very low risk for MACE.    Final Clinical Impressions(s) / ED Diagnoses   Return for weakness,  numbness, changes in vision or speech, fevers >100.4 unrelieved by medication, shortness of breath, intractable vomiting, or diarrhea, abdominal pain, Inability to tolerate liquids or food, cough, altered mental status or any concerns. No signs of systemic illness or infection. The patient is nontoxic-appearing on exam and vital signs are within normal limits.   I have reviewed the triage vital signs and the nursing notes. Pertinent labs &imaging results that were available during my care of the patient were reviewed by me and considered in my medical decision making (see chart for details).  After history, exam, and medical workup I feel the patient has been appropriately medically screened and is safe for discharge home. Pertinent diagnoses were discussed with the patient. Patient was given return precautions.    Sakari Alkhatib, MD 01/02/18 [id number]

## 2018-01-02 NOTE — Telephone Encounter (Signed)
Patient called stating that since her last office visit and her medications had been changed over the  weekend she experienced an "electrical shock feeling" in her upper chest area and she went to the ER not knowing what was going on. She needs some advisement on her medications and also should her Cymbalta be increased? Please review and advise

## 2018-01-03 NOTE — Telephone Encounter (Signed)
Called patient and informed her of the message.

## 2018-01-03 NOTE — Telephone Encounter (Signed)
Lets keep things as they are for now, and we can follow up as scheduled.

## 2018-01-03 NOTE — Telephone Encounter (Signed)
Patient asked if the increase of the Cymbalta  was the cause of her having that reaction of the weekend? Please advise

## 2018-01-03 NOTE — Telephone Encounter (Signed)
Not typically - that can be a side effect of coming down or off cymbalta

## 2018-01-23 ENCOUNTER — Encounter (HOSPITAL_COMMUNITY): Payer: Self-pay | Admitting: Psychiatry

## 2018-01-23 ENCOUNTER — Ambulatory Visit (INDEPENDENT_AMBULATORY_CARE_PROVIDER_SITE_OTHER): Payer: 59 | Admitting: Psychiatry

## 2018-01-23 VITALS — BP 128/78 | HR 86 | Ht 66.0 in | Wt 215.0 lb

## 2018-01-23 DIAGNOSIS — F4312 Post-traumatic stress disorder, chronic: Secondary | ICD-10-CM

## 2018-01-23 DIAGNOSIS — Z813 Family history of other psychoactive substance abuse and dependence: Secondary | ICD-10-CM

## 2018-01-23 DIAGNOSIS — Z79899 Other long term (current) drug therapy: Secondary | ICD-10-CM

## 2018-01-23 DIAGNOSIS — Z87891 Personal history of nicotine dependence: Secondary | ICD-10-CM | POA: Diagnosis not present

## 2018-01-23 DIAGNOSIS — F332 Major depressive disorder, recurrent severe without psychotic features: Secondary | ICD-10-CM | POA: Diagnosis not present

## 2018-01-23 DIAGNOSIS — Z818 Family history of other mental and behavioral disorders: Secondary | ICD-10-CM

## 2018-01-23 DIAGNOSIS — Z811 Family history of alcohol abuse and dependence: Secondary | ICD-10-CM

## 2018-01-23 MED ORDER — ARIPIPRAZOLE 2 MG PO TABS: 2.0000 mg | ORAL_TABLET | Freq: Every day | ORAL | 0 refills | Status: DC

## 2018-01-23 NOTE — Patient Instructions (Addendum)
STOP wellbutrin - I suspect you may feel some fatigue and mild headache coming off this  Continue Cymbalta 60 mg   Start Abilify 2 mg daily, if this is helping, we can increase to 5 mg daily   Take trazodone every night at around 8 pm  Take the clonazepam tablet when you need to - either at night or during the day instead

## 2018-01-23 NOTE — Progress Notes (Signed)
[facility] MD/PA/NP OP Progress Note  01/23/2018 12:43 PM Lori Jensen  MRN:  [id number]  Chief Complaint: not doing well HPI: Lori Jensen continues to struggle with depression, anxiety, irritability.  Poor frustration tolerance and feels externally focused on many of her stressors.  She denies any suicidal thoughts.  She continues to feel depressed and dysphoric, poor motivation, tearful.  We agreed to augment with Abilify, and discontinue Wellbutrin given that Wellbutrin can elicit anxiety and irritability.  We will maintain the current dose of Cymbalta and follow-up in 4-6 weeks.  She denies any alcohol abuse or substance abuse.  I suggested she also consider rejoining the IOP if needed or if her symptoms continue to deteriorate.  Reviewed the risks and benefits of atypical antipsychotic including EPS and metabolic.   Visit Diagnosis:    ICD-10-CM   1. 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 Chronic post-traumatic stress disorder (PTSD) F43.12 ARIPiprazole (ABILIFY) 2 MG tablet    Past Psychiatric History: See intake H&P for full details. Reviewed, with no updates at this time.   Past Medical History:  Past Medical History:  Diagnosis Date  . Anxiety   . Depression   . Dysmenorrhea   . Heart murmur   . Hypertension   . PONV (postoperative nausea and vomiting)     Past Surgical History:  Procedure Laterality Date  . DILATION AND CURETTAGE OF UTERUS    . DILATION AND CURETTAGE OF UTERUS  01/16/2013   Procedure: DILATATION AND CURETTAGE;  Surgeon: Gus Height, MD;  Location: [facility];  Service: Gynecology;;  . ENDOMETRIAL ABLATION    . uterine ablation  08/13/2011    Family Psychiatric History: See intake H&P for full details. Reviewed, with no updates at this time.   Family History:  Family History  Problem Relation Age of Onset  . Heart disease Mother   . Anxiety disorder Mother   . Depression Mother    . Stomach cancer Father   . Heart disease Father   . Other Father        non hodgkins lymphoma  . Alcohol abuse Maternal Aunt   . Drug abuse Maternal Aunt   . Colon cancer Neg Hx     Social History:  Social History   Socioeconomic History  . Marital status: Divorced    Spouse name: Not on file  . Number of children: 2  . Years of education: Not on file  . Highest education level: Not on file  Occupational History  . Occupation: MORTGAGE Therapist, art: [employer]  Social Needs  . Financial resource strain: Somewhat hard  . Food insecurity:    Worry: Sometimes true    Inability: Sometimes true  . Transportation needs:    Medical: No    Non-medical: No  Tobacco Use  . Smoking status: Former Smoker    Years: 20.00    Types: Cigarettes    Last attempt to quit: 08/13/2016    Years since quitting: 1.4  . Smokeless tobacco: Never Used  Substance and Sexual Activity  . Alcohol use: Not Currently    Comment: occasionally  . Drug use: No  . Sexual activity: Not Currently    Partners: Male    Birth control/protection: None, Surgical  Lifestyle  . Physical activity:    Days per week: 0 days    Minutes per session: Not on file  . Stress: Very much  Relationships  .  Social connections:    Talks on phone: Once a week    Gets together: Never    Attends religious service: Never    Active member of club or organization: No    Attends meetings of clubs or organizations: Never    Relationship status: Divorced  Other Topics Concern  . Not on file  Social History Narrative   Reports at times has been emotionally mistreated by partner but denies any other issues.     Allergies:  Allergies  Allergen Reactions  . Codeine Anaphylaxis  . Amoxicillin Rash    Has patient had a PCN reaction causing immediate rash, facial/tongue/throat swelling, SOB or lightheadedness with hypotension: no Has patient had a PCN reaction causing severe rash involving mucus  membranes or skin necrosis: no Has patient had a PCN reaction that required hospitalization no Has patient had a PCN reaction occurring within the last 10 years: yes If all of the above answers are "NO", then may proceed with Cephalosporin use.   Marland Kitchen Doxycycline Rash    Metabolic Disorder Labs: No results found for: HGBA1C, MPG No results found for: PROLACTIN No results found for: CHOL, TRIG, HDL, CHOLHDL, VLDL, LDLCALC No results found for: TSH  Therapeutic Level Labs: No results found for: LITHIUM No results found for: VALPROATE No components found for:  CBMZ  Current Medications: Current Outpatient Medications  Medication Sig Dispense Refill  . ALBUTEROL IN Inhale 2 puffs into the lungs every 6 (six) hours as needed (wheezing).     . clonazePAM (KLONOPIN) 0.5 MG tablet Take 1-2 tablets in the evening for anxiety and sleep 120 tablet 0  . DULoxetine (CYMBALTA) 60 MG capsule Take 1 capsule (60 mg total) by mouth daily. 90 capsule 0  . hydrochlorothiazide (HYDRODIURIL) 25 MG tablet Take 25 mg by mouth daily.     Marland Kitchen lisinopril (PRINIVIL,ZESTRIL) 10 MG tablet Take 10 mg by mouth daily.    . traZODone (DESYREL) 100 MG tablet Take 1 tablet (100 mg total) by mouth at bedtime. 90 tablet 0  . ARIPiprazole (ABILIFY) 2 MG tablet Take 1 tablet (2 mg total) by mouth daily. 90 tablet 0  . bisacodyl (BISACODYL) 5 MG EC tablet Take 1 tablet (5 mg total) by mouth daily as needed for moderate constipation. (Patient not taking: Reported on 01/02/2018) 30 tablet 0   No current facility-administered medications for this visit.      Musculoskeletal: Strength & Muscle Tone: within normal limits Gait & Station: normal Patient leans: N/A  Psychiatric Specialty Exam: ROS  Blood pressure 128/78, pulse 86, height 5\' 6"  (1.676 m), weight 215 lb (97.5 kg).Body mass index is 34.7 kg/m.  General Appearance: Casual and Fairly Groomed  Eye Contact:  Good  Speech:  Clear and Coherent  Volume:  Normal   Mood:  Anxious and Depressed  Affect:  Congruent and Depressed  Thought Process:  Goal Directed and Descriptions of Associations: Intact  Orientation:  Full (Time, Place, and Person)  Thought Content: Logical   Suicidal Thoughts:  No  Homicidal Thoughts:  No  Memory:  Immediate;   Good  Judgement:  Fair  Insight:  Shallow  Psychomotor Activity:  Normal  Concentration:  Concentration: Good  Recall:  Good  Fund of Knowledge: Good  Language: Good  Akathisia:  Negative  Handed:  Right  AIMS (if indicated): not done  Assets:  Communication Skills Desire for Improvement Financial Resources/Insurance Housing  ADL's:  Intact  Cognition: WNL  Sleep:  Fair   Screenings: GAD-7  Counselor from 10/04/2017 in [facility] Counselor from 09/08/2017 in [facility] Counselor from 03/04/2017 in [facility] Counselor from 02/22/2017 in [facility]  Total GAD-7 Score  17  19  12  20     PHQ2-9     Counselor from 10/04/2017 in [facility] Counselor from 09/08/2017 in [facility] Counselor from 03/04/2017 in [facility] Counselor from 02/22/2017 in [facility]  PHQ-2 Total Score  6  6  4  6   PHQ-9 Total Score  23  25  14  25        Assessment and Plan:  Lori Jensen presents with ongoing depression manifesting with irritability, anxiety, depressed mood. Denies any SI.  Chronic external locus of control and poor frustration tolerance.  Will augment with abilify as below, and d/c wellbutrin given prominent anxiety symptoms.   1. Severe recurrent major depression without psychotic features ([place])   2. Chronic post-traumatic stress disorder (PTSD)     Status of current problems: unchanged  Labs  Ordered: No orders of the defined types were placed in this encounter.   Labs Reviewed: n/a  Collateral Obtained/Records Reviewed: n/a  Plan:  Continue cymbalta 60 mg daily Clonazepam 0.5 mg once a day/night as needed for anxiety Trazodone 100 mg nightly Stop wellbutrin Initiate abilify 2 mg daily; increase to 5 mg daily if needed/tolerated  I spent 15 minutes with the patient in direct face-to-face clinical care.  Greater than 50% of this time was spent in counseling and coordination of care with the patient.    Aundra Dubin, MD 01/23/2018, 12:43 PM

## 2018-02-15 ENCOUNTER — Telehealth (HOSPITAL_COMMUNITY): Payer: Self-pay

## 2018-02-15 NOTE — Telephone Encounter (Signed)
Patient called and reports that she reached out to [facility] to get into IOP and was told that it was too soon, [name] Harman gave the patient info to [facility]. Patient went there for IOP and because she reported suicidal thoughts, they recommended inpatient. Patient is now at [facility] inpatient and is calling because she does not feel she is getting the treatment she needs there and would like to come to [facility]. She is calling to talk to you, the number is [phone] and her patient ID number os [id number]. Please review and advise.

## 2018-02-16 ENCOUNTER — Ambulatory Visit (HOSPITAL_COMMUNITY): Payer: Self-pay | Admitting: Psychiatry

## 2018-02-17 ENCOUNTER — Ambulatory Visit (HOSPITAL_COMMUNITY): Payer: Self-pay | Admitting: Psychiatry

## 2018-02-24 ENCOUNTER — Encounter (HOSPITAL_COMMUNITY): Payer: Self-pay | Admitting: Psychiatry

## 2018-02-24 ENCOUNTER — Ambulatory Visit (INDEPENDENT_AMBULATORY_CARE_PROVIDER_SITE_OTHER): Payer: 59 | Admitting: Psychiatry

## 2018-02-24 VITALS — BP 130/72 | HR 92 | Ht 66.0 in | Wt 213.0 lb

## 2018-02-24 DIAGNOSIS — Z87891 Personal history of nicotine dependence: Secondary | ICD-10-CM | POA: Diagnosis not present

## 2018-02-24 DIAGNOSIS — F332 Major depressive disorder, recurrent severe without psychotic features: Secondary | ICD-10-CM | POA: Diagnosis not present

## 2018-02-24 DIAGNOSIS — Z813 Family history of other psychoactive substance abuse and dependence: Secondary | ICD-10-CM

## 2018-02-24 DIAGNOSIS — F4312 Post-traumatic stress disorder, chronic: Secondary | ICD-10-CM

## 2018-02-24 DIAGNOSIS — Z811 Family history of alcohol abuse and dependence: Secondary | ICD-10-CM | POA: Diagnosis not present

## 2018-02-24 DIAGNOSIS — Z818 Family history of other mental and behavioral disorders: Secondary | ICD-10-CM

## 2018-02-24 MED ORDER — HYDROXYZINE HCL 25 MG PO TABS: 25.0000 mg | ORAL_TABLET | Freq: Three times a day (TID) | ORAL | 2 refills | Status: DC

## 2018-02-24 MED ORDER — TRAZODONE HCL 100 MG PO TABS: 100.0000 mg | ORAL_TABLET | Freq: Every day | ORAL | 0 refills | Status: DC

## 2018-02-24 MED ORDER — DULOXETINE HCL 60 MG PO CPEP: 120.0000 mg | ORAL_CAPSULE | Freq: Every day | ORAL | 0 refills | Status: DC

## 2018-02-24 MED ORDER — ARIPIPRAZOLE 10 MG PO TABS: 10.0000 mg | ORAL_TABLET | Freq: Every day | ORAL | 0 refills | Status: DC

## 2018-02-24 NOTE — Progress Notes (Signed)
[facility] MD/PA/NP OP Progress Note  02/24/2018 10:55 AM NATURE KUEKER  MRN:  [id number]  Chief Complaint: med management  HPI: Lori Jensen presents for follow-up after recent hospitalization.  She reports that she had been feeling fairly suicidal, but had no plan in mind.  She reports that they increased her Abilify to 10 mg and increased Cymbalta to 90 mg.  We agreed to continue Abilify and increase Cymbalta further to 120 mg daily.  She denies any significant intolerance.  She denies any acute suicidality or intentions to harm herself.  She continues to participate in the [facility] program, and has decided she will be resigning from her job on June 10.  She is going to be starting application process for long-term state disability and Medicaid.  We spent time discussing some of the pros and cons, and she hopes to be able to return to work in about a year or so after she has gotten a handle on her mental health.  She reports that the last year has been incredibly difficult in terms of her depression and anxiety.  She is sleeping well at night with the trazodone and would like to continue at the current dose.  I sent in refills for her and we will follow-up in 4 weeks.  Visit Diagnosis:    ICD-10-CM   1. Chronic post-traumatic stress disorder (PTSD) F43.12 ARIPiprazole (ABILIFY) 10 MG tablet    hydrOXYzine (ATARAX/VISTARIL) 25 MG tablet  2. 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 Anxiety   . Depression   . Dysmenorrhea   . Heart murmur   . Hypertension   . PONV (postoperative nausea and vomiting)     Past Surgical History:  Procedure  Laterality Date  . DILATION AND CURETTAGE OF UTERUS    . DILATION AND CURETTAGE OF UTERUS  01/16/2013   Procedure: DILATATION AND CURETTAGE;  Surgeon: Gus Height, MD;  Location: [facility];  Service: Gynecology;;  . ENDOMETRIAL ABLATION    . uterine ablation  08/13/2011    Family Psychiatric History: See intake H&P for full details. Reviewed, with no updates at this time.   Family History:  Family History  Problem Relation Age of Onset  . Heart disease Mother   . Anxiety disorder Mother   . Depression Mother   . Stomach cancer Father   . Heart disease Father   . Other Father        non hodgkins lymphoma  . Alcohol abuse Maternal Aunt   . Drug abuse Maternal Aunt   . Colon cancer Neg Hx     Social History:  Social History   Socioeconomic History  . Marital status: Divorced    Spouse name: Not on file  . Number of children: 2  . Years of education: Not on file  . Highest education level: Not on file  Occupational History  . Occupation: MORTGAGE Therapist, art: [employer]  Social Needs  . Financial resource strain: Somewhat hard  . Food insecurity:    Worry: Sometimes true    Inability: Sometimes true  . Transportation needs:    Medical: No    Non-medical: No  Tobacco Use  . Smoking status: Former Smoker    Years: 20.00    Types: Cigarettes    Last attempt to quit: 08/13/2016    Years since quitting: 1.5  . Smokeless tobacco: Never Used  Substance and Sexual Activity  . Alcohol use: Not Currently    Comment: occasionally  . Drug use: No  . Sexual activity: Not Currently    Partners: Male    Birth control/protection: None, Surgical  Lifestyle  . Physical activity:    Days per week: 0 days    Minutes per session: Not on file  . Stress: Very much  Relationships  . Social connections:    Talks on phone: Once a week    Gets together: Never    Attends religious service: Never    Active member of club or organization: No    Attends meetings of  clubs or organizations: Never    Relationship status: Divorced  Other Topics Concern  . Not on file  Social History Narrative   Reports at times has been emotionally mistreated by partner but denies any other issues.     Allergies:  Allergies  Allergen Reactions  . Codeine Anaphylaxis  . Amoxicillin Rash    Has patient had a PCN reaction causing immediate rash, facial/tongue/throat swelling, SOB or lightheadedness with hypotension: no Has patient had a PCN reaction causing severe rash involving mucus membranes or skin necrosis: no Has patient had a PCN reaction that required hospitalization no Has patient had a PCN reaction occurring within the last 10 years: yes If all of the above answers are "NO", then may proceed with Cephalosporin use.   Marland Kitchen Doxycycline Rash    Metabolic Disorder Labs: No results found for: HGBA1C, MPG No results found for: PROLACTIN No results found for: CHOL, TRIG, HDL, CHOLHDL, VLDL, LDLCALC No results found for: TSH  Therapeutic Level Labs: No results found for: LITHIUM No results found for: VALPROATE No components found for:  CBMZ  Current Medications: Current Outpatient Medications  Medication Sig Dispense Refill  . ALBUTEROL IN Inhale 2 puffs into the lungs every 6 (six) hours as needed (wheezing).     . ARIPiprazole (ABILIFY) 10 MG tablet Take 1 tablet (10 mg total) by mouth daily. 90 tablet 0  . clonazePAM (KLONOPIN) 0.5 MG tablet Take 1-2 tablets in the evening for anxiety and sleep 120 tablet 0  . cyclobenzaprine (FLEXERIL) 5 MG tablet Take by mouth.    . DULoxetine (CYMBALTA) 60 MG capsule Take 2 capsules (120 mg total) by mouth daily. 180 capsule 0  . hydrochlorothiazide (HYDRODIURIL) 25 MG tablet Take 25 mg by mouth daily.     . hydrOXYzine (ATARAX/VISTARIL) 25 MG tablet Take 1 tablet (25 mg total) by mouth 3 (three) times daily. 270 tablet 2  . L-methylfolate Calcium 15 MG TABS     . lisinopril (PRINIVIL,ZESTRIL) 10 MG tablet Take 10 mg  by mouth daily.    . traZODone (DESYREL) 100 MG tablet Take 1 tablet (100 mg total) by mouth at bedtime. 90 tablet 0   No current facility-administered medications for this visit.      Musculoskeletal: Strength & Muscle Tone: within normal limits Gait & Station: normal Patient leans: N/A  Psychiatric Specialty Exam: ROS  Blood pressure 130/72, pulse 92, height 5\' 6"  (1.676 m), weight 213 lb (96.6 kg).Body mass index is 34.38 kg/m.  General Appearance: Casual and Fairly Groomed  Eye Contact:  Fair  Speech:  Clear and Coherent and Normal Rate  Volume:  Normal  Mood:  Anxious, Depressed and Irritable  Affect:  Appropriate and Congruent  Thought Process:  Coherent and Descriptions of Associations: Intact  Orientation:  Full (Time, Place, and Person)  Thought Content: Logical   Suicidal Thoughts:  Yes.  without intent/plan  Homicidal Thoughts:  No  Memory:  Immediate;   Fair  Judgement:  Fair  Insight:  Shallow  Psychomotor Activity:  Normal  Concentration:  Concentration: Good  Recall:  [employer] of Knowledge: Fair  Language: Good  Akathisia:  Negative  Handed:  Right  AIMS (if indicated): not done  Assets:  Communication Skills Desire for Improvement Financial Resources/Insurance  ADL's:  Intact  Cognition: WNL  Sleep:  Fair   Screenings: GAD-7     Counselor from 10/04/2017 in [facility] from 09/08/2017 in [facility] from 03/04/2017 in [facility] from 02/22/2017 in [facility]  Total GAD-7 Score  17  19  12  20     PHQ2-9     Counselor from 10/04/2017 in [facility] from 09/08/2017 in [facility] from 03/04/2017 in [facility] from 02/22/2017 in [facility]  PHQ-2 Total Score  6  6  4  6   PHQ-9 Total Score  23  25  14  25       Assessment and Plan: HANA TRIPPETT presents after recent psychiatric hospitalization at [facility].  I reviewed the records sent over and the medication changes.  We agreed to proceed as below, with the continued high dose of Abilify and increased dose of Cymbalta.  She does not present with any acute suicidality or intentions to harm herself.  She is actively engaged in the partial hospitalization program at [facility].  We will follow-up in 4 weeks for medication management check in.  1. Chronic post-traumatic stress disorder (PTSD)   2. Severe recurrent major depression without psychotic features ([place])     Status of current problems: unchanged  Labs Ordered: No orders of the defined types were placed in this encounter.   Labs Reviewed: n/a  Collateral Obtained/Records Reviewed: [facility]  Plan:  Continue Cymbalta 120 mg daily Continue Abilify 10 mg daily Clonazepam 0.5 mg 1-2 times a day as needed Continue Vistaril 25 mg 2-3 times a day as needed Continue folic acid vitamin over-the-counter Continue trazodone 100 mg nightly Return to clinic in 4 weeks We will support patient's process of short-term disability for her current employment given her ongoing mental health exacerbation Aundra Dubin, MD 02/24/2018, 10:55 AM

## 2018-03-25 ENCOUNTER — Encounter (HOSPITAL_COMMUNITY): Payer: Self-pay | Admitting: Psychiatry

## 2018-03-25 ENCOUNTER — Other Ambulatory Visit: Payer: Self-pay

## 2018-03-25 ENCOUNTER — Ambulatory Visit (INDEPENDENT_AMBULATORY_CARE_PROVIDER_SITE_OTHER): Payer: Medicaid Other | Admitting: Psychiatry

## 2018-03-25 VITALS — BP 138/88 | HR 83 | Ht 66.0 in | Wt 211.2 lb

## 2018-03-25 DIAGNOSIS — F4312 Post-traumatic stress disorder, chronic: Secondary | ICD-10-CM

## 2018-03-25 DIAGNOSIS — B370 Candidal stomatitis: Secondary | ICD-10-CM | POA: Insufficient documentation

## 2018-03-25 DIAGNOSIS — N898 Other specified noninflammatory disorders of vagina: Secondary | ICD-10-CM | POA: Insufficient documentation

## 2018-03-25 DIAGNOSIS — F332 Major depressive disorder, recurrent severe without psychotic features: Secondary | ICD-10-CM | POA: Diagnosis not present

## 2018-03-25 DIAGNOSIS — N762 Acute vulvitis: Secondary | ICD-10-CM | POA: Insufficient documentation

## 2018-03-25 DIAGNOSIS — N764 Abscess of vulva: Secondary | ICD-10-CM | POA: Insufficient documentation

## 2018-03-25 MED ORDER — CLONAZEPAM 0.5 MG PO TABS: ORAL_TABLET | ORAL | 0 refills | Status: DC

## 2018-03-25 NOTE — Progress Notes (Signed)
[facility] MD/PA/NP OP Progress Note  03/25/2018 8:58 AM Lori Jensen  MRN:  [id number]  Chief Complaint:  Chief Complaint    Follow-up; Medication Refill; Fatigue    med management  HPI: Lori Jensen reports she does feel like her mood and depressive symptoms are better managed, but she continues to struggle with breakthrough symptoms, anxiety, fears about the future, tearfulness.  She reports that the Abilify seems to help but it also makes her brain and thinking feel a little bit foggy.  I spent time with her educating her about the utility of [facility] given her multiple treatment failures of various antidepressants and she was receptive to [facility] consultation.  She has lost her private insurance coverage and applied for Medicaid, hopeful that this will be approved by the end of July.  She understands Probation officer is leaving practice at the end of August and we discussed the transition of her care to Dr. Adele Schilder for ongoing treatment and medication management.  No acute suicidality at this time and we will follow-up in 6 weeks.  Visit Diagnosis:    ICD-10-CM   1. Chronic post-traumatic stress disorder (PTSD) F43.12   2. 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 Anxiety   . Depression   . Dysmenorrhea   . Heart murmur   . Hypertension   . PONV (postoperative nausea and vomiting)     Past Surgical History:  Procedure Laterality Date  . DILATION AND CURETTAGE OF UTERUS    . DILATION AND CURETTAGE OF UTERUS  01/16/2013   Procedure: DILATATION AND CURETTAGE;  Surgeon: Gus Height, MD;  Location: [facility];  Service: Gynecology;;  . ENDOMETRIAL ABLATION    . uterine ablation  08/13/2011    Family Psychiatric History: See intake H&P for full details. Reviewed, with no updates at this time.   Family  History:  Family History  Problem Relation Age of Onset  . Heart disease Mother   . Anxiety disorder Mother   . Depression Mother   . Stomach cancer Father   . Heart disease Father   . Other Father        non hodgkins lymphoma  . Alcohol abuse Maternal Aunt   . Drug abuse Maternal Aunt   . Colon cancer Neg Hx     Social History:  Social History   Socioeconomic History  . Marital status: Divorced    Spouse name: Not on file  . Number of children: 2  . Years of education: Not on file  . Highest education level: GED or equivalent  Occupational History  . Occupation: MORTGAGE Therapist, art: [employer]  Social Needs  . Financial resource strain: Somewhat hard  . Food insecurity:    Worry: Sometimes true    Inability: Sometimes true  . Transportation needs:    Medical: No    Non-medical: No  Tobacco Use  . Smoking status: Former Smoker    Years: 20.00    Types: Cigarettes    Last attempt to quit: 08/13/2016    Years since quitting: 1.6  . Smokeless tobacco: Never Used  Substance and Sexual Activity  . Alcohol use: Not Currently    Comment: occasionally  . Drug use: No  . Sexual activity: Not Currently    Partners: Male    Birth control/protection:  None, Surgical  Lifestyle  . Physical activity:    Days per week: 0 days    Minutes per session: Not on file  . Stress: Very much  Relationships  . Social connections:    Talks on phone: Once a week    Gets together: Never    Attends religious service: Never    Active member of club or organization: No    Attends meetings of clubs or organizations: Never    Relationship status: Divorced  Other Topics Concern  . Not on file  Social History Narrative   Reports at times has been emotionally mistreated by partner but denies any other issues.     Allergies:  Allergies  Allergen Reactions  . Codeine Anaphylaxis  . Amoxicillin Rash    Has patient had a PCN reaction causing immediate rash,  facial/tongue/throat swelling, SOB or lightheadedness with hypotension: no Has patient had a PCN reaction causing severe rash involving mucus membranes or skin necrosis: no Has patient had a PCN reaction that required hospitalization no Has patient had a PCN reaction occurring within the last 10 years: yes If all of the above answers are "NO", then may proceed with Cephalosporin use.   Marland Kitchen Doxycycline Rash    Metabolic Disorder Labs: No results found for: HGBA1C, MPG No results found for: PROLACTIN No results found for: CHOL, TRIG, HDL, CHOLHDL, VLDL, LDLCALC No results found for: TSH  Therapeutic Level Labs: No results found for: LITHIUM No results found for: VALPROATE No components found for:  CBMZ  Current Medications: Current Outpatient Medications  Medication Sig Dispense Refill  . ALBUTEROL IN Inhale 2 puffs into the lungs every 6 (six) hours as needed (wheezing).     . ARIPiprazole (ABILIFY) 10 MG tablet Take 1 tablet (10 mg total) by mouth daily. 90 tablet 0  . clonazePAM (KLONOPIN) 0.5 MG tablet Take 1-2 tablets in the evening for anxiety and sleep 120 tablet 0  . cyclobenzaprine (FLEXERIL) 5 MG tablet Take by mouth.    . DULoxetine (CYMBALTA) 60 MG capsule Take 2 capsules (120 mg total) by mouth daily. 180 capsule 0  . hydrochlorothiazide (HYDRODIURIL) 25 MG tablet Take 25 mg by mouth daily.     . hydrOXYzine (ATARAX/VISTARIL) 25 MG tablet Take 1 tablet (25 mg total) by mouth 3 (three) times daily. 270 tablet 2  . L-methylfolate Calcium 15 MG TABS     . lisinopril (PRINIVIL,ZESTRIL) 10 MG tablet Take 10 mg by mouth daily.    . traZODone (DESYREL) 100 MG tablet Take 1 tablet (100 mg total) by mouth at bedtime. 90 tablet 0  . PROAIR HFA 108 (90 Base) MCG/ACT inhaler INL 1 PUFF PO Q 4 H PRF WHZ  2   No current facility-administered medications for this visit.      Musculoskeletal: Strength & Muscle Tone: within normal limits Gait & Station: normal Patient leans:  N/A  Psychiatric Specialty Exam: ROS  Blood pressure 138/88, pulse 83, height 5\' 6"  (1.676 m), weight 211 lb 3.2 oz (95.8 kg), SpO2 97 %.Body mass index is 34.09 kg/m.  General Appearance: Casual and Fairly Groomed  Eye Contact:  Fair  Speech:  Clear and Coherent and Normal Rate  Volume:  Normal  Mood:  Depressed, Dysphoric and A little better than last time  Affect:  Appropriate and Congruent  Thought Process:  Coherent and Descriptions of Associations: Intact  Orientation:  Full (Time, Place, and Person)  Thought Content: Logical   Suicidal Thoughts:  Chronic suicidal thoughts,  none currently  Homicidal Thoughts:  No  Memory:  Immediate;   Fair  Judgement:  Fair  Insight:  Shallow  Psychomotor Activity:  Normal  Concentration:  Concentration: Good  Recall:  [employer] of Knowledge: Fair  Language: Good  Akathisia:  Negative  Handed:  Right  AIMS (if indicated): not done  Assets:  Communication Skills Desire for Improvement Financial Resources/Insurance  ADL's:  Intact  Cognition: WNL  Sleep:  Fair   Screenings: GAD-7     Counselor from 10/04/2017 in [facility] from 09/08/2017 in [facility] from 03/04/2017 in [facility] from 02/22/2017 in [facility]  Total GAD-7 Score  17  19  12  20     PHQ2-9     Counselor from 10/04/2017 in [facility] from 09/08/2017 in [facility] from 03/04/2017 in [facility] from 02/22/2017 in [facility]  PHQ-2 Total Score  6  6  4  6   PHQ-9 Total Score  23  25  14  25       Assessment and Plan: Lori Jensen presents with gradual improvement of her depressive symptoms, but ongoing  breakthrough symptoms.  She has failed multiple antidepressants and would be a good candidate for a TMS assessment.  She denies any current suicidality.  She is tolerating her medications fairly well and trying to minimize the use of clonazepam given that she realizes this is habit forming.  We will follow-up in 6 weeks and we have discussed the transition of her care at the end of August, given that writer is leaving this practice August 30.  1. Chronic post-traumatic stress disorder (PTSD)   2. Severe recurrent major depression without psychotic features ([place])     Status of current problems: unchanged  Labs Ordered: No orders of the defined types were placed in this encounter.   Labs Reviewed: n/a  Collateral Obtained/Records Reviewed: [facility]  Plan:  Continue Cymbalta 120 mg daily Continue Abilify 10 mg daily Clonazepam 0.5 mg 1-2 times a day as needed Continue Vistaril 25 mg 2-3 times a day as needed Continue folic acid vitamin over-the-counter Continue trazodone 100 mg nightly Return to clinic in 6 weeks Referral to [facility], patient has Medicaid pending We will support patient's process of short-term disability for her current employment given her ongoing mental health exacerbation  Aundra Dubin, MD 03/25/2018, 8:58 AM

## 2018-05-09 ENCOUNTER — Encounter (HOSPITAL_COMMUNITY): Payer: Self-pay | Admitting: Psychiatry

## 2018-05-09 ENCOUNTER — Ambulatory Visit (HOSPITAL_COMMUNITY): Payer: Medicaid Other | Admitting: Psychiatry

## 2018-05-09 DIAGNOSIS — F4312 Post-traumatic stress disorder, chronic: Secondary | ICD-10-CM

## 2018-05-09 DIAGNOSIS — Z811 Family history of alcohol abuse and dependence: Secondary | ICD-10-CM | POA: Diagnosis not present

## 2018-05-09 DIAGNOSIS — Z818 Family history of other mental and behavioral disorders: Secondary | ICD-10-CM | POA: Diagnosis not present

## 2018-05-09 DIAGNOSIS — Z87891 Personal history of nicotine dependence: Secondary | ICD-10-CM

## 2018-05-09 DIAGNOSIS — F332 Major depressive disorder, recurrent severe without psychotic features: Secondary | ICD-10-CM | POA: Diagnosis not present

## 2018-05-09 DIAGNOSIS — Z813 Family history of other psychoactive substance abuse and dependence: Secondary | ICD-10-CM

## 2018-05-09 MED ORDER — ARIPIPRAZOLE 10 MG PO TABS: 5.0000 mg | ORAL_TABLET | Freq: Every day | ORAL | 0 refills | Status: DC

## 2018-05-09 NOTE — Progress Notes (Signed)
BH MD/PA/NP OP Progress Note  05/09/2018 10:50 AM Lori Jensen  MRN:  [id number]  Chief Complaint:  med management  HPI: Lori Jensen presents with ongoing depression. Saw [place] consults at [place] but medicaid primary not covered. We discussed ECT consult which she was ageable to.  We agreed to taper abilify to 5 mg given some tremulousness with the higher dose.  No acute si, hi, avh and she understands Probation officer is leaving this practice in 3 weeks.  Will transition care to Dr. Doyne Keel for follow-up  Visit Diagnosis:    ICD-10-CM   1. 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 Chronic post-traumatic stress disorder (PTSD) F43.12 ARIPiprazole (ABILIFY) 10 MG tablet     Past Psychiatric History: Recently hospitalized at [facility]  Past Medical History:  Past Medical History:  Diagnosis Date  . Anxiety   . Depression   . Dysmenorrhea   . Heart murmur   . Hypertension   . PONV (postoperative nausea and vomiting)     Past Surgical History:  Procedure Laterality Date  . DILATION AND CURETTAGE OF UTERUS    . DILATION AND CURETTAGE OF UTERUS  01/16/2013   Procedure: DILATATION AND CURETTAGE;  Surgeon: Gus Height, MD;  Location: [facility];  Service: Gynecology;;  . ENDOMETRIAL ABLATION    . uterine ablation  08/13/2011    Family Psychiatric History: See intake H&P for full details. Reviewed, with no updates at this time.   Family History:  Family History  Problem Relation Age of Onset  . Heart disease Mother   . Anxiety disorder Mother   . Depression Mother   . Stomach cancer Father   . Heart disease Father   . Other Father        non hodgkins lymphoma  . Alcohol abuse Maternal Aunt   . Drug abuse Maternal Aunt   . Colon cancer Neg Hx     Social History:  Social History   Socioeconomic History  . Marital status: Divorced    Spouse name: Not on file  .  Number of children: 2  . Years of education: Not on file  . Highest education level: GED or equivalent  Occupational History  . Occupation: MORTGAGE Therapist, art: [employer]  Social Needs  . Financial resource strain: Somewhat hard  . Food insecurity:    Worry: Sometimes true    Inability: Sometimes true  . Transportation needs:    Medical: No    Non-medical: No  Tobacco Use  . Smoking status: Former Smoker    Years: 20.00    Types: Cigarettes    Last attempt to quit: 08/13/2016    Years since quitting: 1.7  . Smokeless tobacco: Never Used  Substance and Sexual Activity  . Alcohol use: Not Currently    Comment: occasionally  . Drug use: No  . Sexual activity: Not Currently    Partners: Male    Birth control/protection: None, Surgical  Lifestyle  . Physical activity:    Days per week: 0 days    Minutes per session: Not on file  . Stress: Very much  Relationships  . Social connections:    Talks on phone: Once a week    Gets together: Never    Attends religious service: Never    Active member of club or organization: No    Attends meetings of clubs or organizations: Never  Relationship status: Divorced  Other Topics Concern  . Not on file  Social History Narrative   Reports at times has been emotionally mistreated by partner but denies any other issues.     Allergies:  Allergies  Allergen Reactions  . Codeine Anaphylaxis  . Amoxicillin Rash    Has patient had a PCN reaction causing immediate rash, facial/tongue/throat swelling, SOB or lightheadedness with hypotension: no Has patient had a PCN reaction causing severe rash involving mucus membranes or skin necrosis: no Has patient had a PCN reaction that required hospitalization no Has patient had a PCN reaction occurring within the last 10 years: yes If all of the above answers are "NO", then may proceed with Cephalosporin use.   Marland Kitchen Doxycycline Rash    Metabolic Disorder Labs: No results  found for: HGBA1C, MPG No results found for: PROLACTIN No results found for: CHOL, TRIG, HDL, CHOLHDL, VLDL, LDLCALC No results found for: TSH  Therapeutic Level Labs: No results found for: LITHIUM No results found for: VALPROATE No components found for:  CBMZ  Current Medications: Current Outpatient Medications  Medication Sig Dispense Refill  . ALBUTEROL IN Inhale 2 puffs into the lungs every 6 (six) hours as needed (wheezing).     . ARIPiprazole (ABILIFY) 10 MG tablet Take 0.5 tablets (5 mg total) by mouth daily. 90 tablet 0  . clonazePAM (KLONOPIN) 0.5 MG tablet Take 1-2 tablets in the evening for anxiety and sleep 120 tablet 0  . cyclobenzaprine (FLEXERIL) 5 MG tablet Take by mouth.    . DULoxetine (CYMBALTA) 60 MG capsule Take 2 capsules (120 mg total) by mouth daily. 180 capsule 0  . hydrochlorothiazide (HYDRODIURIL) 25 MG tablet Take 25 mg by mouth daily.     . hydrOXYzine (ATARAX/VISTARIL) 25 MG tablet Take 1 tablet (25 mg total) by mouth 3 (three) times daily. 270 tablet 2  . L-methylfolate Calcium 15 MG TABS     . lisinopril (PRINIVIL,ZESTRIL) 10 MG tablet Take 10 mg by mouth daily.    Marland Kitchen PROAIR HFA 108 (90 Base) MCG/ACT inhaler INL 1 PUFF PO Q 4 H PRF WHZ  2  . traZODone (DESYREL) 100 MG tablet Take 1 tablet (100 mg total) by mouth at bedtime. 90 tablet 0   No current facility-administered medications for this visit.      Musculoskeletal: Strength & Muscle Tone: within normal limits Gait & Station: normal Patient leans: N/A  Psychiatric Specialty Exam: ROS  Blood pressure 119/85, pulse 84, height 5\' 6"  (1.676 m), weight 207 lb (93.9 kg), SpO2 100 %.Body mass index is 33.41 kg/m.  General Appearance: Casual and Fairly Groomed  Eye Contact:  Fair  Speech:  Clear and Coherent and Normal Rate  Volume:  Normal  Mood:  Depressed and Dysphoric  Affect:  Appropriate and Congruent  Thought Process:  Coherent and Descriptions of Associations: Intact  Orientation:  Full  (Time, Place, and Person)  Thought Content: Logical   Suicidal Thoughts:  Chronic suicidal thoughts, none currently  Homicidal Thoughts:  No  Memory:  Immediate;   Fair  Judgement:  Fair  Insight:  Shallow  Psychomotor Activity:  Normal  Concentration:  Concentration: Good  Recall:  [place] of Knowledge: Fair  Language: Good  Akathisia:  Negative  Handed:  Right  AIMS (if indicated): not done  Assets:  Communication Skills Desire for Improvement Financial Resources/Insurance  ADL's:  Intact  Cognition: WNL  Sleep:  Fair   Screenings: GAD-7     Counselor from  10/04/2017 in [place] [facility] from 09/08/2017 in [facility] [facility] from 03/04/2017 in [facility] [facility] from 02/22/2017 in [place]  Total GAD-7 Score  17  19  12  20     PHQ2-9     Counselor from 10/04/2017 in [facility] from 09/08/2017 in [facility] [facility] from 03/04/2017 in 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  Has had some increased tremulousness with the Abilify 10 mg so we agreed to decrease to 5 mg daily.  Continues with ongoing depression, dysphoria, anhedonia, and was agreeable to an ECT consultation.  We discussed [place] but given her Medicaid status, [place] is not covered by primary Medicaid so she is unable to afford the treatments.  No acute safety concerns at this time and she will schedule follow-up transition of care with Dr. Doyne Keel in this office.  1. Severe recurrent major depression without psychotic features  ([place])   2. Chronic post-traumatic stress disorder (PTSD)     Status of current problems: unchanged  Labs Ordered: Orders Placed This Encounter  Procedures  . Ambulatory referral to Psychiatry    Referral Priority:   Routine    Referral Type:   Psychiatric    Referral Reason:   Specialty Services Required    Referred to Provider:   Gonzella Lex, MD    Requested Specialty:   Psychiatry    Number of Visits Requested:   1    Labs Reviewed: n/a  Collateral Obtained/Records Reviewed: [facility]  Plan:  Continue Cymbalta 120 mg daily Decrease Abilify to 5 mg daily given tremulousness Clonazepam 0.5 mg 1-2 times a day as needed Continue Vistaril 25 mg 2-3 times a day as needed Continue folic acid vitamin over-the-counter Continue trazodone 100 mg nightly We will support patient's process of short-term disability for her current employment given her ongoing mental health exacerbation  Aundra Dubin, MD 05/09/2018, 10:50 AM

## 2018-05-16 ENCOUNTER — Other Ambulatory Visit (HOSPITAL_COMMUNITY): Payer: Self-pay

## 2018-05-16 DIAGNOSIS — F332 Major depressive disorder, recurrent severe without psychotic features: Secondary | ICD-10-CM

## 2018-05-16 MED ORDER — DULOXETINE HCL 60 MG PO CPEP: 120.0000 mg | ORAL_CAPSULE | Freq: Every day | ORAL | 0 refills | Status: DC

## 2018-05-16 MED ORDER — TRAZODONE HCL 100 MG PO TABS: 100.0000 mg | ORAL_TABLET | Freq: Every day | ORAL | 0 refills | Status: DC

## 2018-06-12 ENCOUNTER — Encounter: Payer: Self-pay | Admitting: Psychiatry

## 2018-06-12 ENCOUNTER — Ambulatory Visit (INDEPENDENT_AMBULATORY_CARE_PROVIDER_SITE_OTHER): Payer: Medicaid Other | Admitting: Psychiatry

## 2018-06-12 ENCOUNTER — Other Ambulatory Visit: Payer: Self-pay

## 2018-06-12 DIAGNOSIS — F331 Major depressive disorder, recurrent, moderate: Secondary | ICD-10-CM

## 2018-06-15 ENCOUNTER — Inpatient Hospital Stay: Admission: RE | Admit: 2018-06-15 | Payer: Self-pay | Source: Ambulatory Visit

## 2018-07-13 ENCOUNTER — Encounter (HOSPITAL_COMMUNITY): Payer: Self-pay | Admitting: Psychiatry

## 2018-07-13 ENCOUNTER — Other Ambulatory Visit (HOSPITAL_COMMUNITY): Payer: Self-pay | Admitting: Psychiatry

## 2018-07-13 ENCOUNTER — Ambulatory Visit (INDEPENDENT_AMBULATORY_CARE_PROVIDER_SITE_OTHER): Payer: Medicaid Other | Admitting: Psychiatry

## 2018-07-13 VITALS — BP 106/70 | HR 97 | Ht 66.75 in | Wt 211.0 lb

## 2018-07-13 DIAGNOSIS — F4312 Post-traumatic stress disorder, chronic: Secondary | ICD-10-CM

## 2018-07-13 DIAGNOSIS — F332 Major depressive disorder, recurrent severe without psychotic features: Secondary | ICD-10-CM | POA: Diagnosis not present

## 2018-07-13 MED ORDER — TRAZODONE HCL 100 MG PO TABS: 100.0000 mg | ORAL_TABLET | Freq: Every day | ORAL | 0 refills | Status: DC

## 2018-07-13 MED ORDER — DULOXETINE HCL 60 MG PO CPEP: 120.0000 mg | ORAL_CAPSULE | Freq: Every day | ORAL | 0 refills | Status: DC

## 2018-07-13 MED ORDER — QUETIAPINE FUMARATE 100 MG PO TABS: 100.0000 mg | ORAL_TABLET | Freq: Two times a day (BID) | ORAL | 0 refills | Status: DC

## 2018-07-13 MED ORDER — CLONAZEPAM 0.5 MG PO TABS: ORAL_TABLET | ORAL | 0 refills | Status: DC
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
## 2018-07-14 ENCOUNTER — Other Ambulatory Visit (HOSPITAL_COMMUNITY): Payer: Self-pay | Admitting: Psychiatry

## 2018-07-14 DIAGNOSIS — F4312 Post-traumatic stress disorder, chronic: Secondary | ICD-10-CM

## 2018-07-14 DIAGNOSIS — F332 Major depressive disorder, recurrent severe without psychotic features: Secondary | ICD-10-CM

## 2018-07-19 ENCOUNTER — Other Ambulatory Visit (HOSPITAL_COMMUNITY): Payer: Self-pay

## 2018-07-19 DIAGNOSIS — F332 Major depressive disorder, recurrent severe without psychotic features: Secondary | ICD-10-CM

## 2018-07-19 DIAGNOSIS — F4312 Post-traumatic stress disorder, chronic: Secondary | ICD-10-CM

## 2018-07-19 MED ORDER — QUETIAPINE FUMARATE 100 MG PO TABS
100.0000 mg | ORAL_TABLET | Freq: Two times a day (BID) | ORAL | 0 refills | Status: DC
Start: 2018-07-19 — End: 2018-08-10

## 2018-08-10 ENCOUNTER — Encounter (HOSPITAL_COMMUNITY): Payer: Self-pay | Admitting: Psychiatry

## 2018-08-10 ENCOUNTER — Ambulatory Visit (INDEPENDENT_AMBULATORY_CARE_PROVIDER_SITE_OTHER): Payer: Medicaid Other | Admitting: Psychiatry

## 2018-08-10 VITALS — BP 122/70 | Ht 66.0 in | Wt 217.0 lb

## 2018-08-10 DIAGNOSIS — F332 Major depressive disorder, recurrent severe without psychotic features: Secondary | ICD-10-CM | POA: Diagnosis not present

## 2018-08-10 DIAGNOSIS — F4312 Post-traumatic stress disorder, chronic: Secondary | ICD-10-CM

## 2018-08-10 MED ORDER — DULOXETINE HCL 60 MG PO CPEP: 120.0000 mg | ORAL_CAPSULE | Freq: Every day | ORAL | 1 refills | Status: DC

## 2018-08-10 MED ORDER — QUETIAPINE FUMARATE 100 MG PO TABS: 100.0000 mg | ORAL_TABLET | Freq: Every day | ORAL | 1 refills | Status: DC

## 2018-08-10 MED ORDER — CLONAZEPAM 0.5 MG PO TABS: ORAL_TABLET | ORAL | 1 refills | Status: DC
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
## 2018-10-19 ENCOUNTER — Encounter (HOSPITAL_BASED_OUTPATIENT_CLINIC_OR_DEPARTMENT_OTHER): Payer: Self-pay | Admitting: *Deleted

## 2018-10-19 ENCOUNTER — Encounter (HOSPITAL_COMMUNITY): Payer: Self-pay | Admitting: Psychiatry

## 2018-10-19 ENCOUNTER — Other Ambulatory Visit: Payer: Self-pay

## 2018-10-19 ENCOUNTER — Emergency Department (HOSPITAL_BASED_OUTPATIENT_CLINIC_OR_DEPARTMENT_OTHER)
Admission: EM | Admit: 2018-10-19 | Discharge: 2018-10-19 | Disposition: A | Discharge status: Home / Self Care | Payer: Self-pay | Attending: Emergency Medicine | Admitting: Emergency Medicine

## 2018-10-19 ENCOUNTER — Ambulatory Visit (INDEPENDENT_AMBULATORY_CARE_PROVIDER_SITE_OTHER): Payer: Medicaid Other | Admitting: Psychiatry

## 2018-10-19 DIAGNOSIS — Z87891 Personal history of nicotine dependence: Secondary | ICD-10-CM | POA: Insufficient documentation

## 2018-10-19 DIAGNOSIS — R52 Pain, unspecified: Secondary | ICD-10-CM | POA: Insufficient documentation

## 2018-10-19 DIAGNOSIS — Z79899 Other long term (current) drug therapy: Secondary | ICD-10-CM | POA: Insufficient documentation

## 2018-10-19 DIAGNOSIS — F4312 Post-traumatic stress disorder, chronic: Secondary | ICD-10-CM

## 2018-10-19 DIAGNOSIS — F332 Major depressive disorder, recurrent severe without psychotic features: Secondary | ICD-10-CM

## 2018-10-19 DIAGNOSIS — I10 Essential (primary) hypertension: Secondary | ICD-10-CM | POA: Insufficient documentation

## 2018-10-19 LAB — URINALYSIS, ROUTINE W REFLEX MICROSCOPIC
Bilirubin Urine: NEGATIVE
Glucose, UA: NEGATIVE mg/dL
Hgb urine dipstick: NEGATIVE
Ketones, ur: NEGATIVE mg/dL
Leukocytes, UA: NEGATIVE
NITRITE: NEGATIVE
Protein, ur: NEGATIVE mg/dL
Specific Gravity, Urine: 1.02 (ref 1.005–1.030)
pH: 6 (ref 5.0–8.0)

## 2018-10-19 LAB — CBC
HCT: 42.6 % (ref 36.0–46.0)
HEMOGLOBIN: 13.8 g/dL (ref 12.0–15.0)
MCH: 27.3 pg (ref 26.0–34.0)
MCHC: 32.4 g/dL (ref 30.0–36.0)
MCV: 84.4 fL (ref 80.0–100.0)
Platelets: 252 10*3/uL (ref 150–400)
RBC: 5.05 MIL/uL (ref 3.87–5.11)
RDW: 13.6 % (ref 11.5–15.5)
WBC: 9 10*3/uL (ref 4.0–10.5)
nRBC: 0 % (ref 0.0–0.2)

## 2018-10-19 LAB — BASIC METABOLIC PANEL
Anion gap: 8 (ref 5–15)
BUN: 9 mg/dL (ref 6–20)
CO2: 25 mmol/L (ref 22–32)
Calcium: 8.8 mg/dL — ABNORMAL LOW (ref 8.9–10.3)
Chloride: 101 mmol/L (ref 98–111)
Creatinine, Ser: 0.68 mg/dL (ref 0.44–1.00)
GFR calc non Af Amer: >60 mL/min (ref >60)
Glucose, Bld: 92 mg/dL (ref 70–99)
Potassium: 3.4 mmol/L — ABNORMAL LOW (ref 3.5–5.1)
SODIUM: 134 mmol/L — AB (ref 135–145)

## 2018-10-19 LAB — PHOSPHORUS: Phosphorus: 4.4 mg/dL (ref 2.5–4.6)

## 2018-10-19 LAB — TROPONIN I

## 2018-10-19 LAB — MAGNESIUM: Magnesium: 2.1 mg/dL (ref 1.7–2.4)

## 2018-10-19 LAB — CK: Total CK: 47 U/L (ref 38–234)

## 2018-10-19 MED ORDER — LITHIUM CARBONATE ER 450 MG PO TBCR: 450.0000 mg | EXTENDED_RELEASE_TABLET | Freq: Every day | ORAL | 0 refills | Status: DC

## 2018-10-19 MED ORDER — DULOXETINE HCL 60 MG PO CPEP: 120.0000 mg | ORAL_CAPSULE | Freq: Every day | ORAL | 0 refills | Status: DC

## 2018-10-19 MED ORDER — KETOROLAC TROMETHAMINE 60 MG/2ML IM SOLN
60.0000 mg | Freq: Once | INTRAMUSCULAR | Status: AC
  Administered 2018-10-19: 60 mg via INTRAMUSCULAR
  Filled 2018-10-19: qty 2

## 2018-10-19 MED ORDER — CLONAZEPAM 0.5 MG PO TABS: ORAL_TABLET | ORAL | 0 refills | Status: DC

## 2018-10-19 MED ORDER — TRAZODONE HCL 100 MG PO TABS: 100.0000 mg | ORAL_TABLET | Freq: Every day | ORAL | 0 refills | Status: DC
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
## 2018-10-19 NOTE — ED Notes (Signed)
Pt getting a urine sample at this time

## 2018-10-19 NOTE — ED Provider Notes (Signed)
[facility] EMERGENCY DEPARTMENT Provider Note   CSN: [id number] Arrival date & time: 10/19/18  1736     History   Chief Complaint Chief Complaint  Patient presents with  . Generalized Body Aches    HPI Lori Jensen is a 48 y.o. female.  HPI Patient reports he has aches all over her body that are worse than her typical fibromyalgia.  She reports that her upper arms are hurting.  She reports she is got spots that are sore on them.  She feels like the muscles around her hips are very painful.  She reports she feels very generally fatigued.  Patient denies she is had any fever.  There is been no nausea no vomiting.  No significant cough.  Patient denies abdominal pain.  She reports that she will be starting lithium because her Seroquel was discontinued.  She has not yet started lithium.  She reports she also suffers from anxiety and depression which sometimes makes symptoms worse as well. Past Medical History:  Diagnosis Date  . Anxiety   . Depression   . Dysmenorrhea   . Fibromyalgia 11/2017  . Heart murmur   . Hypertension   . PONV (postoperative nausea and vomiting)     Patient Active Problem List   Diagnosis Date Noted  . Vulval cellulitis 03/25/2018  . Vaginal discharge 03/25/2018  . Candidiasis of mouth 03/25/2018  . Abscess of vulva 03/25/2018  . Migraine 12/03/2017  . Neck fullness 09/21/2017  . Irregular menstrual bleeding 11/10/2016  . Moderate episode of recurrent major depressive disorder ([place]) 11/06/2015  . Obesity (BMI 30-39.9) 11/03/2015  . Myofascial pain 11/03/2015  . Essential hypertension 11/03/2015  . SMOKER 07/23/2009  . PALPITATIONS 06/12/2009  . PANIC ATTACKS 11/24/2006  . DEPRESSIVE DISORDER, NOS 11/24/2006  . MITRAL VALVE PROLAPSE 11/24/2006  . CHEST PAIN 11/24/2006    Past Surgical History:  Procedure Laterality Date  . DILATION AND CURETTAGE OF UTERUS    . DILATION AND CURETTAGE OF UTERUS  01/16/2013   Procedure: DILATATION  AND CURETTAGE;  Surgeon: Gus Height, MD;  Location: [facility];  Service: Gynecology;;  . ENDOMETRIAL ABLATION    . uterine ablation  08/13/2011     OB History    Gravida  7   Para  2   Term  2   Preterm      AB  5   Living  2     SAB  3   TAB  2   Ectopic      Multiple      Live Births               Home Medications    Prior to Admission medications   Medication Sig Start Date End Date Taking? Authorizing Provider  ALBUTEROL IN Inhale 2 puffs into the lungs every 6 (six) hours as needed (wheezing).     [provider]  clonazePAM Bobbye [place]) 0.5 MG tablet Take 1-2 tablets in the evening for anxiety and sleep 10/19/18   [name], MD  cyclobenzaprine (FLEXERIL) 5 MG tablet Take by mouth. 02/01/18   [provider]  DULoxetine (CYMBALTA) 60 MG capsule Take 2 capsules (120 mg total) by mouth daily. 10/19/18   [name], MD  hydrochlorothiazide (HYDRODIURIL) 25 MG tablet Take 25 mg by mouth daily.  01/31/13   [provider]  lisinopril (PRINIVIL,ZESTRIL) 10 MG tablet Take 10 mg by mouth daily.    [provider]  lithium carbonate (ESKALITH) 450 MG  CR tablet Take 1 tablet (450 mg total) by mouth daily. 10/19/18   [name], MD  PROAIR HFA [phone] Base) MCG/ACT inhaler INL 1 PUFF PO Q 4 H PRF WHZ 03/11/18   [provider]  traZODone (DESYREL) 100 MG tablet Take 1 tablet (100 mg total) by mouth at bedtime. 10/19/18   [name], MD    Family History Family History  Problem Relation Age of Onset  . Heart disease Mother   . Anxiety disorder Mother   . Depression Mother   . Stomach cancer Father   . Heart disease Father   . Other Father        non hodgkins lymphoma  . Alcohol abuse Maternal Aunt   . Drug abuse Maternal Aunt   . Alcohol abuse Brother   . Alcohol abuse Maternal Uncle   . Alcohol abuse Paternal Uncle   . Colon cancer Neg Hx     Social History Social History   Tobacco Use  . Smoking  status: Former Smoker    Years: 20.00    Types: Cigarettes, E-cigarettes    Last attempt to quit: 08/13/2016    Years since quitting: 2.1  . Smokeless tobacco: Never Used  . Tobacco comment: Reports she does Vap occasionally   Substance Use Topics  . Alcohol use: Not Currently    Comment: occasionally  . Drug use: No     Allergies   Codeine; Amoxicillin; and Doxycycline   Review of Systems Review of Systems 10 Systems reviewed and are negative for acute change except as noted in the HPI.   Physical Exam Updated Vital Signs BP 131/89   Pulse 66   Temp 98.3 F (36.8 C) (Oral)   Resp 17   Ht 5\' 6"  (1.676 m)   Wt 101.2 kg   SpO2 96%   BMI 35.99 kg/m   Physical Exam Constitutional:      Appearance: She is well-developed.  HENT:     Head: Normocephalic and atraumatic.     Mouth/Throat:     Mouth: Mucous membranes are moist.  Eyes:     Pupils: Pupils are equal, round, and reactive to light.  Neck:     Musculoskeletal: Neck supple.  Cardiovascular:     Rate and Rhythm: Normal rate and regular rhythm.     Heart sounds: Normal heart sounds.  Pulmonary:     Effort: Pulmonary effort is normal.     Breath sounds: Normal breath sounds.  Abdominal:     General: Bowel sounds are normal. There is no distension.     Palpations: Abdomen is soft.     Tenderness: There is no abdominal tenderness.  Musculoskeletal: Normal range of motion.  Skin:    General: Skin is warm and dry.     Findings: No rash.  Neurological:     Mental Status: She is alert and oriented to person, place, and time.     GCS: GCS eye subscore is 4. GCS verbal subscore is 5. GCS motor subscore is 6.     Cranial Nerves: No cranial nerve deficit.     Coordination: Coordination normal.  Psychiatric:        Mood and Affect: Mood normal.      ED Treatments / Results  Labs (all labs ordered are listed, but only abnormal results are displayed) Labs Reviewed  BASIC METABOLIC PANEL - Abnormal; Notable  for the following components:      Result Value   Sodium 134 (*)  Potassium 3.4 (*)    Calcium 8.8 (*)    All other components within normal limits  URINALYSIS, ROUTINE W REFLEX MICROSCOPIC - Abnormal; Notable for the following components:   APPearance CLOUDY (*)    All other components within normal limits  CBC  MAGNESIUM  PHOSPHORUS  CK  TROPONIN I    EKG EKG Interpretation  Date/Time:  Thursday October 19 2018 19:09:03 EST Ventricular Rate:  71 PR Interval:    QRS Duration: 82 QT Interval:  404 QTC Calculation: 439 R Axis:   34 Text Interpretation:  Sinus rhythm Low voltage, precordial leads normal, no change from previous Confirmed by Charlesetta Shanks [phone]) on 10/19/2018 8:03:09 PM   Radiology No results found.  Procedures Procedures (including critical care time)  Medications Ordered in ED Medications  ketorolac (TORADOL) injection 60 mg (60 mg Intramuscular Given 10/19/18 1917)     Initial Impression / Assessment and Plan / ED Course  I have reviewed the triage vital signs and the nursing notes.  Pertinent labs & imaging results that were available during my care of the patient were reviewed by me and considered in my medical decision making (see chart for details).    Patient has clear well in appearance.  She is nontoxic and alert.  He has generalized pain.  She felt this was different from her typical fibromyalgia pain.  Lab work-up is within normal limits.  At this time I feel she is stable to continue her home pain regiment.  She is counseled on close follow-up with her primary provider.  Final Clinical Impressions(s) / ED Diagnoses   Final diagnoses:  Generalized pain    ED Discharge Orders    None       Charlesetta Shanks, MD 10/19/18 2036

## 2018-10-19 NOTE — ED Notes (Signed)
Pt monitor

## 2018-10-19 NOTE — ED Triage Notes (Signed)
Pt c/o generalized body aches x 1 month, pt states " her fibromyalgia is acting up "

## 2018-10-26 ENCOUNTER — Ambulatory Visit (HOSPITAL_COMMUNITY): Payer: Medicaid Other | Admitting: Psychiatry

## 2018-10-28 ENCOUNTER — Emergency Department (HOSPITAL_BASED_OUTPATIENT_CLINIC_OR_DEPARTMENT_OTHER)
Admission: EM | Admit: 2018-10-28 | Discharge: 2018-10-28 | Disposition: A | Discharge status: Home / Self Care | Payer: Self-pay | Attending: Emergency Medicine | Admitting: Emergency Medicine

## 2018-10-28 ENCOUNTER — Emergency Department (HOSPITAL_BASED_OUTPATIENT_CLINIC_OR_DEPARTMENT_OTHER): Payer: Self-pay

## 2018-10-28 ENCOUNTER — Encounter (HOSPITAL_BASED_OUTPATIENT_CLINIC_OR_DEPARTMENT_OTHER): Payer: Self-pay

## 2018-10-28 ENCOUNTER — Other Ambulatory Visit: Payer: Self-pay

## 2018-10-28 DIAGNOSIS — K029 Dental caries, unspecified: Secondary | ICD-10-CM | POA: Insufficient documentation

## 2018-10-28 DIAGNOSIS — W25XXXA Contact with sharp glass, initial encounter: Secondary | ICD-10-CM | POA: Insufficient documentation

## 2018-10-28 DIAGNOSIS — S61217A Laceration without foreign body of left little finger without damage to nail, initial encounter: Secondary | ICD-10-CM | POA: Insufficient documentation

## 2018-10-28 DIAGNOSIS — S91311A Laceration without foreign body, right foot, initial encounter: Secondary | ICD-10-CM | POA: Insufficient documentation

## 2018-10-28 DIAGNOSIS — Y998 Other external cause status: Secondary | ICD-10-CM | POA: Insufficient documentation

## 2018-10-28 DIAGNOSIS — Y9389 Activity, other specified: Secondary | ICD-10-CM | POA: Insufficient documentation

## 2018-10-28 DIAGNOSIS — Y9289 Other specified places as the place of occurrence of the external cause: Secondary | ICD-10-CM | POA: Insufficient documentation

## 2018-10-28 DIAGNOSIS — I10 Essential (primary) hypertension: Secondary | ICD-10-CM | POA: Insufficient documentation

## 2018-10-28 DIAGNOSIS — Z23 Encounter for immunization: Secondary | ICD-10-CM | POA: Insufficient documentation

## 2018-10-28 MED ORDER — LIDOCAINE-EPINEPHRINE (PF) 2 %-1:200000 IJ SOLN
10.0000 mL | Freq: Once | INTRAMUSCULAR | Status: AC
  Administered 2018-10-28: 5 mL
  Filled 2018-10-28 (×2): qty 10

## 2018-10-28 MED ORDER — TETANUS-DIPHTH-ACELL PERTUSSIS 5-2.5-18.5 LF-MCG/0.5 IM SUSP
0.5000 mL | Freq: Once | INTRAMUSCULAR | Status: AC
  Administered 2018-10-28: 0.5 mL via INTRAMUSCULAR
  Filled 2018-10-28: qty 0.5

## 2018-10-28 MED ORDER — CLINDAMYCIN HCL 300 MG PO CAPS: 300.0000 mg | ORAL_CAPSULE | Freq: Four times a day (QID) | ORAL | 0 refills | Status: DC

## 2018-10-28 NOTE — ED Provider Notes (Signed)
[facility] Provider Note   CSN: [id number] Arrival date & time: 10/28/18  2109     History   Chief Complaint Chief Complaint  Patient presents with  . Extremity Laceration    HPI Lori Jensen is a 48 y.o. female.  Lori Jensen is a 48 y.o. female with history of hypertension, heart murmur, fibromyalgia, anxiety and depression, who presents to the emergency department for evaluation of a laceration to the left hand and right foot.  Patient reports this occurred just prior to arrival when the patient was cleaning a wine glass and it fell on it she tried to catch it it broke causing a laceration at the base of the fifth finger on the left hand and a small laceration across the top of the right foot.  Bleeding controlled.  Patient unsure of last tetanus vaccination.  No numbness tingling or weakness, able to move all fingers without difficulty.  Nothing for pain prior to arrival.  Patient is very anxious about having lacerations repaired.  Patient also reports dental pain surrounding a broken tooth, she reports she has had issues intermittently with 1 of her left lower molars ever since it broke but has been unable to see a dentist.  She denies any pain or swelling under the tongue, no fevers or chills, no nausea or vomiting.  No difficulty swallowing or breathing.  She reports she intermittently requires antibiotics if this area gets infected.     Past Medical History:  Diagnosis Date  . Anxiety   . Depression   . Dysmenorrhea   . Fibromyalgia 11/2017  . Heart murmur   . Hypertension   . PONV (postoperative nausea and vomiting)     Patient Active Problem List   Diagnosis Date Noted  . Vulval cellulitis 03/25/2018  . Vaginal discharge 03/25/2018  . Candidiasis of mouth 03/25/2018  . Abscess of vulva 03/25/2018  . Migraine 12/03/2017  . Neck fullness 09/21/2017  . Irregular menstrual bleeding 11/10/2016  . Moderate episode of recurrent  major depressive disorder ([place]) 11/06/2015  . Obesity (BMI 30-39.9) 11/03/2015  . Myofascial pain 11/03/2015  . Essential hypertension 11/03/2015  . SMOKER 07/23/2009  . PALPITATIONS 06/12/2009  . PANIC ATTACKS 11/24/2006  . DEPRESSIVE DISORDER, NOS 11/24/2006  . MITRAL VALVE PROLAPSE 11/24/2006  . CHEST PAIN 11/24/2006    Past Surgical History:  Procedure Laterality Date  . DILATION AND CURETTAGE OF UTERUS    . DILATION AND CURETTAGE OF UTERUS  01/16/2013   Procedure: DILATATION AND CURETTAGE;  Surgeon: Gus Height, MD;  Location: [facility];  Service: Gynecology;;  . ENDOMETRIAL ABLATION    . uterine ablation  08/13/2011     OB History    Gravida  7   Para  2   Term  2   Preterm      AB  5   Living  2     SAB  3   TAB  2   Ectopic      Multiple      Live Births               Home Medications    Prior to Admission medications   Medication Sig Start Date End Date Taking? Authorizing Provider  ALBUTEROL IN Inhale 2 puffs into the lungs every 6 (six) hours as needed (wheezing).     [provider]  clonazePAM (KLONOPIN) 0.5 MG tablet Take 1-2 tablets in the evening for anxiety and sleep 10/19/18  [name], MD  cyclobenzaprine (FLEXERIL) 5 MG tablet Take by mouth. 02/01/18   [provider]  DULoxetine (CYMBALTA) 60 MG capsule Take 2 capsules (120 mg total) by mouth daily. 10/19/18   [name], MD  hydrochlorothiazide (HYDRODIURIL) 25 MG tablet Take 25 mg by mouth daily.  01/31/13   [provider]  lisinopril (PRINIVIL,ZESTRIL) 10 MG tablet Take 10 mg by mouth daily.    [provider]  lithium carbonate (ESKALITH) 450 MG CR tablet Take 1 tablet (450 mg total) by mouth daily. 10/19/18   [name], MD  PROAIR HFA [phone] Base) MCG/ACT inhaler INL 1 PUFF PO Q 4 H PRF WHZ 03/11/18   [provider]  traZODone (DESYREL) 100 MG tablet Take 1 tablet (100 mg total) by mouth at bedtime. 10/19/18   [name],  MD    Family History Family History  Problem Relation Age of Onset  . Heart disease Mother   . Anxiety disorder Mother   . Depression Mother   . Stomach cancer Father   . Heart disease Father   . Other Father        non hodgkins lymphoma  . Alcohol abuse Maternal Aunt   . Drug abuse Maternal Aunt   . Alcohol abuse Brother   . Alcohol abuse Maternal Uncle   . Alcohol abuse Paternal Uncle   . Colon cancer Neg Hx     Social History Social History   Tobacco Use  . Smoking status: Former Smoker    Years: 20.00    Types: Cigarettes, E-cigarettes    Last attempt to quit: 08/13/2016    Years since quitting: 2.2  . Smokeless tobacco: Never Used  . Tobacco comment: Reports she does Vap occasionally   Substance Use Topics  . Alcohol use: Not Currently    Comment: occasionally  . Drug use: No     Allergies   Codeine; Amoxicillin; and Doxycycline   Review of Systems Review of Systems  Constitutional: Negative for chills and fever.  HENT: Positive for dental problem. Negative for facial swelling.   Gastrointestinal: Negative for nausea and vomiting.  Skin: Positive for wound. Negative for color change.  Neurological: Negative for weakness and numbness.  Psychiatric/Behavioral: The patient is nervous/anxious.      Physical Exam Updated Vital Signs BP (!) 122/92 (BP Location: Right Arm)   Pulse 90   Temp 99.1 F (37.3 C) (Oral)   Resp 16   Ht 5\' 6"  (1.676 m)   Wt 97.5 kg   SpO2 96%   BMI 34.70 kg/m   Physical Exam Vitals signs and nursing note reviewed.  Constitutional:      General: She is not in acute distress.    Appearance: Normal appearance. She is well-developed. She is not diaphoretic.     Comments: Patient is very anxious on arrival but is in no acute distress.  HENT:     Head: Normocephalic and atraumatic.     Mouth/Throat:   Eyes:     General:        Right eye: No discharge.        Left eye: No discharge.  Pulmonary:     Effort: Pulmonary  effort is normal. No respiratory distress.  Musculoskeletal:     Left hand: She exhibits laceration.       Hands:     Right foot: Laceration present.       Feet:  Neurological:     Mental Status: She is alert.  Coordination: Coordination normal.  Psychiatric:        Behavior: Behavior normal.      ED Treatments / Results  Labs (all labs ordered are listed, but only abnormal results are displayed) Labs Reviewed - No data to display  EKG None  Radiology No results found.  Procedures .Marland KitchenLaceration Repair Date/Time: 10/31/2018 12:07 AM Performed by: Jacqlyn Larsen, PA-C Authorized by: Jacqlyn Larsen, PA-C   Consent:    Consent obtained:  Verbal   Consent given by:  Patient   Risks discussed:  Infection, pain, poor cosmetic result and poor wound healing   Alternatives discussed:  No treatment Anesthesia (see MAR for exact dosages):    Anesthesia method:  Local infiltration   Local anesthetic:  Lidocaine 2% WITH epi Laceration details:    Location:  Foot   Foot location:  Top of R foot   Length (cm):  2   Depth (mm):  3 Repair type:    Repair type:  Simple Pre-procedure details:    Preparation:  Patient was prepped and draped in usual sterile fashion and imaging obtained to evaluate for foreign bodies Exploration:    Hemostasis achieved with:  Epinephrine and direct pressure   Wound exploration: wound explored through full range of motion and entire depth of wound probed and visualized     Wound extent: areolar tissue violated     Wound extent: no foreign bodies/material noted and no underlying fracture noted   Treatment:    Area cleansed with:  Saline   Amount of cleaning:  Standard   Irrigation solution:  Sterile saline   Irrigation volume:  250   Irrigation method:  Pressure wash Skin repair:    Repair method:  Sutures   Suture size:  4-0   Suture material:  Prolene   Suture technique:  Simple interrupted   Number of sutures:  2 Approximation:     Approximation:  Close Post-procedure details:    Dressing:  Antibiotic ointment and adhesive bandage   Patient tolerance of procedure:  Tolerated well, no immediate complications .Marland KitchenLaceration Repair Date/Time: 10/31/2018 12:10 AM Performed by: Jacqlyn Larsen, PA-C Authorized by: Jacqlyn Larsen, PA-C   Consent:    Consent obtained:  Verbal   Consent given by:  Patient   Risks discussed:  Infection, pain, poor cosmetic result and poor wound healing   Alternatives discussed:  No treatment Anesthesia (see MAR for exact dosages):    Anesthesia method:  Local infiltration   Local anesthetic:  Lidocaine 2% WITH epi Laceration details:    Location:  Hand   Hand location:  L palm   Length (cm):  2   Depth (mm):  3 Repair type:    Repair type:  Simple Pre-procedure details:    Preparation:  Patient was prepped and draped in usual sterile fashion and imaging obtained to evaluate for foreign bodies Exploration:    Hemostasis achieved with:  Epinephrine and direct pressure   Wound exploration: wound explored through full range of motion and entire depth of wound probed and visualized     Wound extent: areolar tissue violated     Wound extent: no foreign bodies/material noted and no underlying fracture noted   Treatment:    Area cleansed with:  Saline   Amount of cleaning:  Standard   Irrigation solution:  Sterile saline   Irrigation volume:  250   Irrigation method:  Pressure wash Skin repair:    Repair method:  Sutures   Suture size:  4-0   Suture material:  Prolene   Suture technique:  Simple interrupted   Number of sutures:  2 Approximation:    Approximation:  Close Post-procedure details:    Dressing:  Adhesive bandage and antibiotic ointment   Patient tolerance of procedure:  Tolerated well, no immediate complications   (including critical care time)  Medications Ordered in ED Medications  Tdap (BOOSTRIX) injection 0.5 mL (0.5 mLs Intramuscular Given 10/28/18 2216)    lidocaine-EPINEPHrine (XYLOCAINE W/EPI) 2 %-1:200000 (PF) injection 10 mL (5 mLs Infiltration Given by Other 10/28/18 2350)     Initial Impression / Assessment and Plan / ED Course  I have reviewed the triage vital signs and the nursing notes.  Pertinent labs & imaging results that were available during my care of the patient were reviewed by me and considered in my medical decision making (see chart for details).  Presents for laceration to the left hand and right foot after a wine glass broke.  2 cm lacerations with bleeding controlled, hand and foot are both neurovascularly intact imaging shows no evidence of glass or other foreign bodies noted.  The areas were cleaned with saline and anesthetized, both closed with 2 simple interrupted sutures, procedure tolerated well by patient.  Tetanus updated.  Patient also reports toothache.  No gross abscess.  Exam unconcerning for Ludwig's angina or spread of infection.  Will treat with clindamycin and anti-inflammatories medicine.  Urged patient to follow-up with dentist.     Final Clinical Impressions(s) / ED Diagnoses   Final diagnoses:  Laceration of left little finger without foreign body without damage to nail, initial encounter  Laceration of right foot, initial encounter  Pain due to dental caries    ED Discharge Orders         Ordered    clindamycin (CLEOCIN) 300 MG capsule  4 times daily     10/28/18 2333           Janet Berlin 10/31/18 0013    Elnora Morrison, MD 11/03/18 1901

## 2018-10-28 NOTE — ED Triage Notes (Signed)
Dropped a glass and has a lac to L hand and R foot.

## 2018-10-28 NOTE — Discharge Instructions (Addendum)
Your stitches will need to be removed in about 7 days.  Keep the area covered with antibiotic ointment and a dressing.  You may shower and clean the area with gentle soap and water but avoid submerging the area underwater, no swimming or doing the dishes.  Monitor the area for signs of infection such as redness, swelling, increasing pain or drainage if these occur return for reevaluation.  Take antibiotics for dental pain and follow-up with dentist using resources provided.

## 2018-10-28 NOTE — ED Notes (Signed)
ED Provider at bedside. 

## 2018-10-28 NOTE — ED Notes (Signed)
Went in to attempt irrigation on the patients wound. Patient became agitated and stated "I was told It would be numbed before they do anything. I dont want it irrigated I need it numbed." EMT stated I can look into that but I was just following orders. Patient and family were agitated and stated " Yeah go find out I need it numbed"

## 2018-10-28 NOTE — ED Notes (Signed)
Patient transported to X-ray 

## 2018-10-28 NOTE — ED Notes (Signed)
Antibiotic ointment and band aids applied to wounds per EMT. Pt tolerated well.

## 2018-11-02 ENCOUNTER — Ambulatory Visit (HOSPITAL_COMMUNITY): Payer: Medicaid Other | Admitting: Psychiatry

## 2018-11-06 ENCOUNTER — Other Ambulatory Visit (HOSPITAL_COMMUNITY): Payer: Self-pay | Admitting: Psychiatry

## 2018-11-06 DIAGNOSIS — F332 Major depressive disorder, recurrent severe without psychotic features: Secondary | ICD-10-CM

## 2018-11-23 ENCOUNTER — Ambulatory Visit (INDEPENDENT_AMBULATORY_CARE_PROVIDER_SITE_OTHER): Payer: Medicaid Other | Admitting: Psychiatry

## 2018-11-23 ENCOUNTER — Other Ambulatory Visit (HOSPITAL_COMMUNITY): Payer: Self-pay

## 2018-11-23 ENCOUNTER — Encounter (HOSPITAL_COMMUNITY): Payer: Self-pay | Admitting: Psychiatry

## 2018-11-23 DIAGNOSIS — F4312 Post-traumatic stress disorder, chronic: Secondary | ICD-10-CM

## 2018-11-23 DIAGNOSIS — F332 Major depressive disorder, recurrent severe without psychotic features: Secondary | ICD-10-CM

## 2018-11-23 MED ORDER — TRAZODONE HCL 100 MG PO TABS: 100.0000 mg | ORAL_TABLET | Freq: Every day | ORAL | 1 refills | Status: DC

## 2018-11-23 MED ORDER — CLONAZEPAM 1 MG PO TABS: 1.0000 mg | ORAL_TABLET | Freq: Three times a day (TID) | ORAL | 1 refills | Status: DC | PRN

## 2018-11-23 MED ORDER — LITHIUM CARBONATE ER 300 MG PO TBCR: 600.0000 mg | EXTENDED_RELEASE_TABLET | Freq: Every day | ORAL | 1 refills | Status: DC

## 2018-11-23 MED ORDER — GABAPENTIN 300 MG PO CAPS: 300.0000 mg | ORAL_CAPSULE | Freq: Three times a day (TID) | ORAL | 0 refills | Status: DC

## 2018-11-23 MED ORDER — DULOXETINE HCL 60 MG PO CPEP: 120.0000 mg | ORAL_CAPSULE | Freq: Every day | ORAL | 1 refills | Status: DC
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
## 2018-12-12 ENCOUNTER — Telehealth (HOSPITAL_COMMUNITY): Payer: Self-pay

## 2018-12-12 NOTE — Telephone Encounter (Signed)
Medication management - Telephone call with patient 3 times and Dr. Doyne Keel 2 times to follow-up on pt's reported increased swelling in her feet, feeling more lethargic and nausea at times since increase in Lithium. Patient could not come in this evening to have lab work done or to another lab due to lack of transportation.  Patient reported still having 450 mg Lithium tablets at home so Dr. Doyne Keel instructed patient to lower dosage to 450 mg as she was previously taking, to keep appointment on 12/21/18 as scheduled and we will then do another lithium level that day.  Patient instructed it may take 3-5 days to see a lot of improvement but if anything worsened to contact us back immediately as she should begin to see some improvements by then.  Patient stated understanding plan and instructions and agrees will call back if any worsening of symptoms.

## 2018-12-21 ENCOUNTER — Ambulatory Visit (INDEPENDENT_AMBULATORY_CARE_PROVIDER_SITE_OTHER): Payer: Medicaid Other | Admitting: Psychiatry

## 2018-12-21 ENCOUNTER — Other Ambulatory Visit: Payer: Self-pay

## 2018-12-21 DIAGNOSIS — F4322 Adjustment disorder with anxiety: Secondary | ICD-10-CM

## 2018-12-21 DIAGNOSIS — F332 Major depressive disorder, recurrent severe without psychotic features: Secondary | ICD-10-CM

## 2018-12-21 DIAGNOSIS — F4312 Post-traumatic stress disorder, chronic: Secondary | ICD-10-CM

## 2018-12-21 MED ORDER — DULOXETINE HCL 60 MG PO CPEP: 120.0000 mg | ORAL_CAPSULE | Freq: Every day | ORAL | 2 refills | Status: DC

## 2018-12-21 MED ORDER — LITHIUM CARBONATE ER 450 MG PO TBCR: 450.0000 mg | EXTENDED_RELEASE_TABLET | Freq: Every day | ORAL | 2 refills | Status: DC

## 2018-12-21 MED ORDER — GABAPENTIN 300 MG PO CAPS: 300.0000 mg | ORAL_CAPSULE | Freq: Three times a day (TID) | ORAL | 0 refills | Status: DC

## 2018-12-21 MED ORDER — TRAZODONE HCL 100 MG PO TABS: 100.0000 mg | ORAL_TABLET | Freq: Every day | ORAL | 2 refills | Status: DC

## 2018-12-21 MED ORDER — CLONAZEPAM 1 MG PO TABS: 1.0000 mg | ORAL_TABLET | Freq: Three times a day (TID) | ORAL | 2 refills | Status: DC | PRN
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
## 2019-01-25 ENCOUNTER — Ambulatory Visit (INDEPENDENT_AMBULATORY_CARE_PROVIDER_SITE_OTHER): Payer: Medicaid Other | Admitting: Psychiatry

## 2019-01-25 ENCOUNTER — Encounter (HOSPITAL_COMMUNITY): Payer: Self-pay | Admitting: Psychiatry

## 2019-01-25 ENCOUNTER — Other Ambulatory Visit: Payer: Self-pay

## 2019-01-25 DIAGNOSIS — F332 Major depressive disorder, recurrent severe without psychotic features: Secondary | ICD-10-CM

## 2019-01-25 DIAGNOSIS — F4312 Post-traumatic stress disorder, chronic: Secondary | ICD-10-CM
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
## 2019-03-14 ENCOUNTER — Encounter: Payer: Self-pay | Admitting: *Deleted

## 2019-03-15 ENCOUNTER — Telehealth (HOSPITAL_COMMUNITY): Payer: Self-pay | Admitting: Psychiatry

## 2019-03-15 ENCOUNTER — Other Ambulatory Visit: Payer: Self-pay

## 2019-03-15 ENCOUNTER — Ambulatory Visit (HOSPITAL_COMMUNITY): Payer: Medicaid Other | Admitting: Psychiatry

## 2019-03-15 NOTE — Telephone Encounter (Signed)
I called the patient during our scheduled appt time. There was no answer. I left a VM asking pt to call the clinic

## 2019-04-02 ENCOUNTER — Other Ambulatory Visit: Payer: Self-pay

## 2019-04-02 ENCOUNTER — Encounter (HOSPITAL_BASED_OUTPATIENT_CLINIC_OR_DEPARTMENT_OTHER): Payer: Self-pay | Admitting: *Deleted

## 2019-04-02 ENCOUNTER — Emergency Department (HOSPITAL_BASED_OUTPATIENT_CLINIC_OR_DEPARTMENT_OTHER)
Admission: EM | Admit: 2019-04-02 | Discharge: 2019-04-02 | Disposition: A | Discharge status: Home / Self Care | Payer: Self-pay | Attending: Emergency Medicine | Admitting: Emergency Medicine

## 2019-04-02 DIAGNOSIS — Y92018 Other place in single-family (private) house as the place of occurrence of the external cause: Secondary | ICD-10-CM | POA: Insufficient documentation

## 2019-04-02 DIAGNOSIS — Y999 Unspecified external cause status: Secondary | ICD-10-CM | POA: Insufficient documentation

## 2019-04-02 DIAGNOSIS — I10 Essential (primary) hypertension: Secondary | ICD-10-CM | POA: Insufficient documentation

## 2019-04-02 DIAGNOSIS — W57XXXA Bitten or stung by nonvenomous insect and other nonvenomous arthropods, initial encounter: Secondary | ICD-10-CM

## 2019-04-02 DIAGNOSIS — S30861A Insect bite (nonvenomous) of abdominal wall, initial encounter: Secondary | ICD-10-CM | POA: Insufficient documentation

## 2019-04-02 DIAGNOSIS — Z87891 Personal history of nicotine dependence: Secondary | ICD-10-CM | POA: Insufficient documentation

## 2019-04-02 DIAGNOSIS — S20362A Insect bite (nonvenomous) of left front wall of thorax, initial encounter: Secondary | ICD-10-CM

## 2019-04-02 DIAGNOSIS — Y9389 Activity, other specified: Secondary | ICD-10-CM | POA: Insufficient documentation

## 2019-04-02 DIAGNOSIS — Z79899 Other long term (current) drug therapy: Secondary | ICD-10-CM | POA: Insufficient documentation

## 2019-04-02 MED ORDER — ONDANSETRON 4 MG PO TBDP
4.0000 mg | ORAL_TABLET | Freq: Once | ORAL | Status: AC
  Administered 2019-04-02: 15:00:00 4 mg via ORAL
  Filled 2019-04-02: qty 1

## 2019-04-02 MED ORDER — ONDANSETRON 4 MG PO TBDP: 4.0000 mg | ORAL_TABLET | Freq: Three times a day (TID) | ORAL | 0 refills | Status: DC | PRN

## 2019-04-02 NOTE — ED Provider Notes (Signed)
[facility] EMERGENCY DEPARTMENT Provider Note   CSN: [id number] Arrival date & time: 04/02/19  1447    History   Chief Complaint Chief Complaint  Patient presents with  . Insect Bite    HPI Lori Jensen is a 48 y.o. female.     HPI Patient presents with an insect bite to her left flank.  Began around 3 hours prior to arrival.  States she was sitting on the couch after taking her dog outside and thought that something was poking her from in the couch.  States she realized it was something under sure she really reached up and pulled out something hairy and threw it.  Thinks it was a spider.  Initially was red and swollen.  Patient brought a picture on her phone.  Now swelling has improved, but she states she now has some aching her left shoulder and a mild nausea.  No fevers.  No cough.  No difficulty breathing Past Medical History:  Diagnosis Date  . Anxiety   . Depression   . Dysmenorrhea   . Fibromyalgia 11/2017  . Heart murmur   . Hypertension   . PONV (postoperative nausea and vomiting)     Patient Active Problem List   Diagnosis Date Noted  . Vulval cellulitis 03/25/2018  . Vaginal discharge 03/25/2018  . Candidiasis of mouth 03/25/2018  . Abscess of vulva 03/25/2018  . Migraine 12/03/2017  . Neck fullness 09/21/2017  . Irregular menstrual bleeding 11/10/2016  . Moderate episode of recurrent major depressive disorder ([place]) 11/06/2015  . Obesity (BMI 30-39.9) 11/03/2015  . Myofascial pain 11/03/2015  . Essential hypertension 11/03/2015  . SMOKER 07/23/2009  . PALPITATIONS 06/12/2009  . PANIC ATTACKS 11/24/2006  . DEPRESSIVE DISORDER, NOS 11/24/2006  . MITRAL VALVE PROLAPSE 11/24/2006  . CHEST PAIN 11/24/2006    Past Surgical History:  Procedure Laterality Date  . DILATION AND CURETTAGE OF UTERUS    . DILATION AND CURETTAGE OF UTERUS  01/16/2013   Procedure: DILATATION AND CURETTAGE;  Surgeon: Gus Height, MD;  Location: [facility];  Service:  Gynecology;;  . ENDOMETRIAL ABLATION    . uterine ablation  08/13/2011     OB History    Gravida  7   Para  2   Term  2   Preterm      AB  5   Living  2     SAB  3   TAB  2   Ectopic      Multiple      Live Births               Home Medications    Prior to Admission medications   Medication Sig Start Date End Date Taking? Authorizing Provider  ALBUTEROL IN Inhale 2 puffs into the lungs every 6 (six) hours as needed (wheezing).     [provider]  clonazePAM (KLONOPIN) 1 MG tablet Take 1 tablet (1 mg total) by mouth 3 (three) times daily as needed for anxiety. 12/21/18   [name], MD  cyclobenzaprine (FLEXERIL) 5 MG tablet Take by mouth. 02/01/18   [provider]  DULoxetine (CYMBALTA) 60 MG capsule Take 2 capsules (120 mg total) by mouth daily. 12/21/18   [name], MD  gabapentin (NEURONTIN) 300 MG capsule Take 1 capsule (300 mg total) by mouth 3 (three) times daily. 12/21/18 12/21/19  [name], MD  hydrochlorothiazide (HYDRODIURIL) 25 MG tablet Take 25 mg by mouth daily.  01/31/13   [provider]  lisinopril (PRINIVIL,ZESTRIL) 10 MG tablet Take 10 mg by mouth daily.    [provider]  lithium carbonate (ESKALITH) 450 MG CR tablet Take 1 tablet (450 mg total) by mouth daily. 12/21/18   [name], MD  ondansetron (ZOFRAN-ODT) 4 MG disintegrating tablet Take 1 tablet (4 mg total) by mouth every 8 (eight) hours as needed for nausea or vomiting. 04/02/19   Davonna Belling, MD  PROAIR HFA 108 [phone] Base) MCG/ACT inhaler INL 1 PUFF PO Q 4 H PRF WHZ 03/11/18   [provider]  traZODone (DESYREL) 100 MG tablet Take 1 tablet (100 mg total) by mouth at bedtime. 12/21/18   [name], MD    Family History Family History  Problem Relation Age of Onset  . Heart disease Mother   . Anxiety disorder Mother   . Depression Mother   . Stomach cancer Father   . Heart disease Father   . Other Father         non hodgkins lymphoma  . Alcohol abuse Maternal Aunt   . Drug abuse Maternal Aunt   . Alcohol abuse Brother   . Alcohol abuse Maternal Uncle   . Alcohol abuse Paternal Uncle   . Colon cancer Neg Hx     Social History Social History   Tobacco Use  . Smoking status: Former Smoker    Years: 20.00    Types: Cigarettes, E-cigarettes    Quit date: 08/13/2016    Years since quitting: 2.6  . Smokeless tobacco: Never Used  . Tobacco comment: Reports she does Vap occasionally   Substance Use Topics  . Alcohol use: Not Currently    Comment: occasionally  . Drug use: No     Allergies   Codeine, Amoxicillin, and Doxycycline   Review of Systems Review of Systems  Constitutional: Negative for appetite change.  HENT: Negative for congestion.   Respiratory: Negative for shortness of breath and wheezing.   Gastrointestinal: Positive for nausea.  Musculoskeletal: Positive for myalgias.  Skin: Positive for wound.  Neurological: Negative for weakness and numbness.  Psychiatric/Behavioral: Negative for confusion.     Physical Exam Updated Vital Signs BP (!) 144/96   Pulse 83   Temp 98.4 F (36.9 C) (Oral)   Resp 18   Ht 5\' 6"  (1.676 m)   Wt 90.7 kg   SpO2 100%   BMI 32.28 kg/m   Physical Exam Vitals signs and nursing note reviewed.  HENT:     Head: Atraumatic.  Cardiovascular:     Rate and Rhythm: Regular rhythm.  Pulmonary:     Effort: No respiratory distress.  Skin:    General: Skin is warm.     Comments: Skin on left flank has no further evidence of insect bites.  Neurological:     Mental Status: She is alert. Mental status is at baseline.      ED Treatments / Results  Labs (all labs ordered are listed, but only abnormal results are displayed) Labs Reviewed - No data to display  EKG None  Radiology No results found.  Procedures Procedures (including critical care time)  Medications Ordered in ED Medications  ondansetron (ZOFRAN-ODT)  disintegrating tablet 4 mg (4 mg Oral Given 04/02/19 1513)     Initial Impression / Assessment and Plan / ED Course  I have reviewed the triage vital signs and the nursing notes.  Pertinent labs & imaging results that were available during my care of the patient were reviewed by me and considered in my  medical decision making (see chart for details).       Patient with potential spider bite versus other insect bite to left flank.  Skin changes resolved with does have nausea and myalgias.  Doubt bad infection at this time.  Will give Zofran for symptomatic treatment.  Also instructed on Tylenol and Motrin.  Discharge home.  Final Clinical Impressions(s) / ED Diagnoses   Final diagnoses:  None    ED Discharge Orders         Ordered    ondansetron (ZOFRAN-ODT) 4 MG disintegrating tablet  Every 8 hours PRN     04/02/19 1510           Davonna Belling, MD 04/02/19 1527

## 2019-04-02 NOTE — ED Triage Notes (Signed)
Pt c/o insect bite to left flank x 3 hrs ago

## 2019-04-11 ENCOUNTER — Encounter: Payer: Self-pay | Admitting: Medical

## 2019-04-11 ENCOUNTER — Telehealth: Payer: Self-pay | Admitting: Medical

## 2019-04-11 ENCOUNTER — Encounter: Payer: Self-pay | Admitting: Obstetrics and Gynecology

## 2019-04-11 NOTE — Telephone Encounter (Signed)
Mailing the patient a missed appointment letter.

## 2019-06-21 ENCOUNTER — Other Ambulatory Visit: Payer: Self-pay

## 2019-06-21 ENCOUNTER — Ambulatory Visit (INDEPENDENT_AMBULATORY_CARE_PROVIDER_SITE_OTHER): Payer: Medicaid Other | Admitting: Psychiatry

## 2019-06-21 ENCOUNTER — Encounter (HOSPITAL_COMMUNITY): Payer: Self-pay | Admitting: Psychiatry

## 2019-06-21 ENCOUNTER — Telehealth (HOSPITAL_COMMUNITY): Payer: Self-pay

## 2019-06-21 DIAGNOSIS — F4312 Post-traumatic stress disorder, chronic: Secondary | ICD-10-CM

## 2019-06-21 DIAGNOSIS — F332 Major depressive disorder, recurrent severe without psychotic features: Secondary | ICD-10-CM

## 2019-06-21 MED ORDER — DULOXETINE HCL 60 MG PO CPEP: 120.0000 mg | ORAL_CAPSULE | Freq: Every day | ORAL | 2 refills | Status: DC

## 2019-06-21 MED ORDER — GABAPENTIN 300 MG PO CAPS: 300.0000 mg | ORAL_CAPSULE | Freq: Three times a day (TID) | ORAL | 0 refills | Status: DC

## 2019-06-21 MED ORDER — CLONAZEPAM 1 MG PO TABS: 1.0000 mg | ORAL_TABLET | Freq: Three times a day (TID) | ORAL | 2 refills | Status: DC | PRN

## 2019-06-21 MED ORDER — TRAZODONE HCL 100 MG PO TABS: 100.0000 mg | ORAL_TABLET | Freq: Every day | ORAL | 2 refills | Status: DC

## 2019-06-21 MED ORDER — LITHIUM CARBONATE ER 450 MG PO TBCR: 450.0000 mg | EXTENDED_RELEASE_TABLET | Freq: Every day | ORAL | 2 refills | Status: DC

## 2019-06-21 NOTE — Progress Notes (Signed)
Virtual Visit via Telephone Note  I connected with Lori Jensen  on 06/21/19 at  9:45 AM EDT by telephone and verified that I am speaking with the correct person using two identifiers.  Location: Patient: home Provider: office   I discussed the limitations, risks, security and privacy concerns of performing an evaluation and management service by telephone and the availability of in person appointments. I also discussed with the patient that there may be a patient responsible charge related to this service. The patient expressed understanding and agreed to proceed.   History of Present Illness: Pt states she has been dealing with a lot since the passing of her mother. Pt has not been taking her nighttime meds- Trazodone, Lithium and 3rd dose of Neurontin. She had stopped them in April and didn't know how to restart them. Pt has now realized she needs to restart them. Her sleep has been poor. She has difficulty falling and staying asleep. She is having a lot of dreams and nightmares.  Pt has been sleeping on her couch because of her back. She has found herself waking up sitting on floor next to the couch. Yusra has also found herself waking from yelling out in her sleep. As far at PTSD she is having some VH of feeling like she is seeing something in the corner of her eye. The nights are really hard on her. Pt has been missing her son who is now in basic training in the army. Pt does not like to go and being alone is not good for her. She is very depressed. She is having crying spells. Her days are spent writing letters to her son and scrolling web sites trying to find pictures of her son. On days her daughter is off then pt is active and going out. Sadi denies SI/HI. Claresa has been taking the Klonopin 3x/day and it helps to decrease her anxiety. She has been irritable and short fused which was better when she was taking all her meds.    Observations/Objective: I spoke with Lori Jensen  on the phone.  Pt was calm, pleasant and cooperative.  Pt was engaged in the conversation and answered questions appropriately.  Speech was clear and coherent with normal rate, tone and volume.  Mood is depressed and anxious, affect is congruent and tearful. Thought processes are coherent, goal oriented and intact.  Thought content is with ruminations.  Pt denies SI/HI.   Pt denies auditory and visual hallucinations and did not appear to be responding to internal stimuli.  Memory and concentration are good.  Fund of knowledge and use of language are average.  Insight and judgment are fair.  I am unable to comment on psychomotor activity, general appearance, hygiene, or eye contact as I was unable to physically see the patient on the phone.  Vital signs not available since interview conducted virtually.     Assessment and Plan: MDD-recurrent, severe without psychotic features; PTSD  Status of current symptoms: worsening of depression  Cymbalta 120mg  po qD for MDD and PTSD  Klonopin 1mg  po TID prn anxiety  Restart Trazodone 100mg  po qhs  Restart Lithium ER 450mg  po qD  Increase Neurontin 300mg  po TID   Follow Up Instructions: In 6-8 weeks or sooner if needed   I discussed the assessment and treatment plan with the patient. The patient was provided an opportunity to ask questions and all were answered. The patient agreed with the plan and demonstrated an understanding of the instructions.  The patient was advised to call back or seek an in-person evaluation if the symptoms worsen or if the condition fails to improve as anticipated.  I provided 20 minutes of non-face-to-face time during this encounter.   [name], MD

## 2019-06-21 NOTE — Telephone Encounter (Signed)
Yes HCTZ can cause Lithium levels to go up.

## 2019-06-21 NOTE — Telephone Encounter (Signed)
Pharmacist at [facility] is calling about patients Lithium. She states that patient was recently started on Lisinopril and Hydrochlorothiazide. She is concerned that there could be a reaction. Please review and advise, thank you

## 2019-08-09 ENCOUNTER — Encounter (HOSPITAL_COMMUNITY): Payer: Self-pay | Admitting: Psychiatry

## 2019-08-09 ENCOUNTER — Ambulatory Visit (INDEPENDENT_AMBULATORY_CARE_PROVIDER_SITE_OTHER): Payer: Medicaid Other | Admitting: Psychiatry

## 2019-08-09 ENCOUNTER — Other Ambulatory Visit: Payer: Self-pay

## 2019-08-09 DIAGNOSIS — F4312 Post-traumatic stress disorder, chronic: Secondary | ICD-10-CM

## 2019-08-09 DIAGNOSIS — F332 Major depressive disorder, recurrent severe without psychotic features: Secondary | ICD-10-CM

## 2019-08-09 NOTE — Progress Notes (Signed)
  Virtual Visit via Telephone Note  I connected with Lori Jensen  on 08/09/19 at 11:00 AM EST by telephone and verified that I am speaking with the correct person using two identifiers.  Location: Patient: home Provider: office   I discussed the limitations, risks, security and privacy concerns of performing an evaluation and management service by telephone and the availability of in person appointments. I also discussed with the patient that there may be a patient responsible charge related to this service. The patient expressed understanding and agreed to proceed.   History of Present Illness: "I started taking all the medicine like I was supposed to but then I got COVID". She stopped taking all of them for at least one week. After that she became very depressed and was having some fleeting SI. This last week she restarted everything and she can tell the difference. It is still present but it is definitely better. A lot of it is due to her son being depressed. It is very stressful. She is sad that she can't go out. Lori Jensen is reaching out to her supports. Pt is hopeful and denies SI/HI. Lori Jensen reports HV and nightmares.     Observations/Objective: I spoke with Lori Jensen on the phone.  Pt was calm, pleasant and cooperative.  Pt was engaged in the conversation and answered questions appropriately.  Speech was clear and coherent with normal rate, tone and volume.  Mood is depressed and anxious, affect is congruent. Thought processes are coherent, goal oriented and intact.  Thought content is logical.  Pt denies SI/HI.   Pt denies auditory and visual hallucinations and did not appear to be responding to internal stimuli.  Memory and concentration are good.  Fund of knowledge and use of language are average.  Insight and judgment are fair.  I am unable to comment on psychomotor activity, general appearance, hygiene, or eye contact as I was unable to physically see the patient on the phone.   Vital signs not available since interview conducted virtually.     Assessment and Plan: MDD-recurrent, severe without psychotic features; PTSD  Status of current symptoms: ongoing symptoms of depression and PTSD  Restart Cymbalta 120mg  po qD for MDD and PTSD   Restart Klonopin 1mg  po TID prn anxiety  Restart Trazodone 100mg  po qhs  restart Lithium ER 450mg  po qD    restart Neurontin 300mg  po TID  No refills today as pt reports she just picked up the medications  Follow Up Instructions: In 8 weeks or sooner if needed   I discussed the assessment and treatment plan with the patient. The patient was provided an opportunity to ask questions and all were answered. The patient agreed with the plan and demonstrated an understanding of the instructions.   The patient was advised to call back or seek an in-person evaluation if the symptoms worsen or if the condition fails to improve as anticipated.  I provided 25 minutes of non-face-to-face time during this encounter.   [name], MD

## 2019-10-18 ENCOUNTER — Ambulatory Visit (HOSPITAL_COMMUNITY): Payer: Self-pay | Admitting: Psychiatry

## 2019-10-18 ENCOUNTER — Telehealth (HOSPITAL_COMMUNITY): Payer: Self-pay | Admitting: Psychiatry

## 2019-10-18 ENCOUNTER — Other Ambulatory Visit: Payer: Self-pay

## 2019-10-18 NOTE — Telephone Encounter (Signed)
I called Lori Jensen on the phone number listed on the chart.  There was no answer.  I left a voice message letting her know that we had an appointment and she could call the clinic back to get a hold of me pulled with me.  There was no return phone call.  I was unable to speak with [name] today.

## 2020-01-09 ENCOUNTER — Other Ambulatory Visit (HOSPITAL_COMMUNITY): Payer: Self-pay | Admitting: Psychiatry

## 2020-01-09 DIAGNOSIS — F4312 Post-traumatic stress disorder, chronic: Secondary | ICD-10-CM

## 2020-01-09 DIAGNOSIS — F332 Major depressive disorder, recurrent severe without psychotic features: Secondary | ICD-10-CM

## 2020-01-12 ENCOUNTER — Other Ambulatory Visit: Payer: Self-pay

## 2020-01-12 ENCOUNTER — Encounter (HOSPITAL_BASED_OUTPATIENT_CLINIC_OR_DEPARTMENT_OTHER): Payer: Self-pay | Admitting: Emergency Medicine

## 2020-01-12 ENCOUNTER — Emergency Department (HOSPITAL_BASED_OUTPATIENT_CLINIC_OR_DEPARTMENT_OTHER)
Admission: EM | Admit: 2020-01-12 | Discharge: 2020-01-13 | Disposition: A | Discharge status: Home / Self Care | Payer: 59 | Attending: Emergency Medicine | Admitting: Emergency Medicine

## 2020-01-12 DIAGNOSIS — Z885 Allergy status to narcotic agent status: Secondary | ICD-10-CM | POA: Insufficient documentation

## 2020-01-12 DIAGNOSIS — Z79899 Other long term (current) drug therapy: Secondary | ICD-10-CM | POA: Diagnosis not present

## 2020-01-12 DIAGNOSIS — Z881 Allergy status to other antibiotic agents status: Secondary | ICD-10-CM | POA: Diagnosis not present

## 2020-01-12 DIAGNOSIS — I10 Essential (primary) hypertension: Secondary | ICD-10-CM | POA: Diagnosis not present

## 2020-01-12 DIAGNOSIS — Z110 Encounter for screening for intestinal infectious diseases: Secondary | ICD-10-CM | POA: Insufficient documentation

## 2020-01-12 DIAGNOSIS — K0889 Other specified disorders of teeth and supporting structures: Secondary | ICD-10-CM | POA: Insufficient documentation

## 2020-01-12 DIAGNOSIS — M79604 Pain in right leg: Secondary | ICD-10-CM | POA: Insufficient documentation

## 2020-01-12 DIAGNOSIS — M25561 Pain in right knee: Secondary | ICD-10-CM | POA: Diagnosis not present

## 2020-01-12 DIAGNOSIS — Z87891 Personal history of nicotine dependence: Secondary | ICD-10-CM | POA: Insufficient documentation

## 2020-01-12 LAB — D-DIMER, QUANTITATIVE (NOT AT ARMC): D-Dimer, Quant: 0.42 ug/mL-FEU (ref 0.00–0.50)

## 2020-01-12 MED ORDER — CLINDAMYCIN HCL 300 MG PO CAPS: 300.0000 mg | ORAL_CAPSULE | Freq: Three times a day (TID) | ORAL | 0 refills | Status: DC

## 2020-01-12 NOTE — ED Triage Notes (Addendum)
Reports pain behind right knee and swelling in right foot.  Ambulatory at this time.  Does endorse exercising more recently.

## 2020-01-12 NOTE — ED Provider Notes (Signed)
[facility] EMERGENCY DEPARTMENT Provider Note   CSN: [id number] Arrival date & time: 01/12/20  2123     History Chief Complaint  Patient presents with  . Leg Pain    Lori Jensen is a 49 y.o. female.  The history is provided by the patient.  Leg Pain Location:  Knee and leg Leg location:  R leg Pain details:    Quality:  Aching   Severity:  Moderate   Onset quality:  Gradual   Duration:  3 days   Timing:  Constant   Progression:  Worsening Chronicity:  New Relieved by:  Nothing Worsened by:  Extension and flexion Associated symptoms: swelling   Associated symptoms: no fever, no muscle weakness and no numbness   Patient presents for right leg pain.  She reports pain and swelling in her right knee and right foot No trauma, but does report increased exercise recently.  No history of DVT. She is not on contraceptives No Chest pain/shortness of breath      Past Medical History:  Diagnosis Date  . Anxiety   . Depression   . Dysmenorrhea   . Fibromyalgia 11/2017  . Heart murmur   . Hypertension   . PONV (postoperative nausea and vomiting)     Patient Active Problem List   Diagnosis Date Noted  . Vulval cellulitis 03/25/2018  . Vaginal discharge 03/25/2018  . Candidiasis of mouth 03/25/2018  . Abscess of vulva 03/25/2018  . Migraine 12/03/2017  . Neck fullness 09/21/2017  . Irregular menstrual bleeding 11/10/2016  . Moderate episode of recurrent major depressive disorder ([place]) 11/06/2015  . Obesity (BMI 30-39.9) 11/03/2015  . Myofascial pain 11/03/2015  . Essential hypertension 11/03/2015  . SMOKER 07/23/2009  . PALPITATIONS 06/12/2009  . PANIC ATTACKS 11/24/2006  . DEPRESSIVE DISORDER, NOS 11/24/2006  . MITRAL VALVE PROLAPSE 11/24/2006  . CHEST PAIN 11/24/2006    Past Surgical History:  Procedure Laterality Date  . DILATION AND CURETTAGE OF UTERUS    . DILATION AND CURETTAGE OF UTERUS  01/16/2013   Procedure: DILATATION AND CURETTAGE;   Surgeon: Gus Height, MD;  Location: [facility];  Service: Gynecology;;  . ENDOMETRIAL ABLATION    . uterine ablation  08/13/2011     OB History    Gravida  7   Para  2   Term  2   Preterm      AB  5   Living  2     SAB  3   TAB  2   Ectopic      Multiple      Live Births              Family History  Problem Relation Age of Onset  . Heart disease Mother   . Anxiety disorder Mother   . Depression Mother   . Stomach cancer Father   . Heart disease Father   . Other Father        non hodgkins lymphoma  . Alcohol abuse Maternal Aunt   . Drug abuse Maternal Aunt   . Alcohol abuse Brother   . Alcohol abuse Maternal Uncle   . Alcohol abuse Paternal Uncle   . Colon cancer Neg Hx     Social History   Tobacco Use  . Smoking status: Former Smoker    Years: 20.00    Types: Cigarettes, E-cigarettes    Quit date: 08/13/2016    Years since quitting: 3.4  . Smokeless tobacco: Never Used  . Tobacco  comment: Reports she does Vap occasionally   Substance Use Topics  . Alcohol use: Not Currently    Comment: occasionally  . Drug use: No    Home Medications Prior to Admission medications   Medication Sig Start Date End Date Taking? Authorizing Provider  ALBUTEROL IN Inhale 2 puffs into the lungs every 6 (six) hours as needed (wheezing).     [provider]  clindamycin (CLEOCIN) 300 MG capsule Take 1 capsule (300 mg total) by mouth 3 (three) times daily. 01/12/20   Ripley Fraise, MD  clonazePAM (KLONOPIN) 1 MG tablet Take 1 tablet (1 mg total) by mouth 3 (three) times daily as needed for anxiety. 06/21/19   [name], MD  cyclobenzaprine (FLEXERIL) 5 MG tablet Take by mouth. 02/01/18   [provider]  DULoxetine (CYMBALTA) 60 MG capsule Take 2 capsules (120 mg total) by mouth daily. 06/21/19   [name], MD  gabapentin (NEURONTIN) 300 MG capsule Take 1 capsule (300 mg total) by mouth 3 (three) times daily. 06/21/19 06/20/20  [name],  MD  hydrochlorothiazide (HYDRODIURIL) 25 MG tablet Take 12.5 mg by mouth daily.  01/31/13   [provider]  lisinopril (PRINIVIL,ZESTRIL) 10 MG tablet Take 10 mg by mouth daily.    [provider]  lithium carbonate (ESKALITH) 450 MG CR tablet Take 1 tablet (450 mg total) by mouth daily. 06/21/19   [name], MD  PROAIR HFA 108 [phone] Base) MCG/ACT inhaler INL 1 PUFF PO Q 4 H PRF WHZ 03/11/18   [provider]  traZODone (DESYREL) 100 MG tablet Take 1 tablet (100 mg total) by mouth at bedtime. 06/21/19   [name], MD    Allergies    Codeine, Amoxicillin, and Doxycycline  Review of Systems   Review of Systems  Constitutional: Negative for fever.  HENT: Positive for dental problem.   Respiratory: Negative for shortness of breath.   Cardiovascular: Negative for chest pain.  Gastrointestinal: Positive for nausea. Negative for vomiting.  Musculoskeletal: Positive for arthralgias and joint swelling.  All other systems reviewed and are negative.   Physical Exam Updated Vital Signs BP (!) 126/94 (BP Location: Right Arm)   Pulse 84   Temp 98.4 F (36.9 C) (Oral)   Resp 18   Ht 1.676 m (5\' 6" )   Wt 87.5 kg   SpO2 98%   BMI 31.15 kg/m   Physical Exam CONSTITUTIONAL: Well developed/well nourished HEAD: Normocephalic/atraumatic EYES: EOMI/PERRL ENMT: Mucous membranes moist NECK: supple no meningeal signs SPINE/BACK:entire spine nontender CV: S1/S2 noted, no murmurs/rubs/gallops noted LUNGS: Lungs are clear to auscultation bilaterally, no apparent distress ABDOMEN: soft, nontender, no rebound or guarding, bowel sounds noted throughout abdomen GU:no cva tenderness NEURO: Pt is awake/alert/appropriate, moves all extremitiesx4.  No facial droop.  EXTREMITIES: pulses normal/equal, full ROM, no calf tenderness.  No edema noted lower extremities.  Mild tenderness to right total fossa, no thrill noted.  Full range of motion right knee without difficulty.  No  joint effusion noted No swelling or tenderness to right foot SKIN: warm, color normal PSYCH: no abnormalities of mood noted, alert and oriented to situation  ED Results / Procedures / Treatments   Labs (all labs ordered are listed, but only abnormal results are displayed) Labs Reviewed  D-DIMER, QUANTITATIVE (NOT AT [facility])    EKG None  Radiology No results found.  Procedures Procedures (including critical care time)  Medications Ordered in ED Medications - No data to display  ED Course  I have  reviewed the triage vital signs and the nursing notes.  Pertinent labs  results that were available during my care of the patient were reviewed by me and considered in my medical decision making (see chart for details).    MDM Rules/Calculators/A&P                      11:39 PM Low suspicion for DVT.  Will check D-dimer.  Patient also requesting antibiotic for her dental pain  D-dimer negative. Low Suspicion for DVT.  Will discharge home No signs of cellulitis  Final Clinical Impression(s) / ED Diagnoses Final diagnoses:  Pain, dental  Right leg pain    Rx / DC Orders ED Discharge Orders         Ordered    clindamycin (CLEOCIN) 300 MG capsule  3 times daily     01/12/20 2324           Ripley Fraise, MD 01/12/20 2359

## 2020-01-24 ENCOUNTER — Other Ambulatory Visit: Payer: Self-pay

## 2020-01-24 ENCOUNTER — Encounter (HOSPITAL_COMMUNITY): Payer: Self-pay | Admitting: Psychiatry

## 2020-01-24 ENCOUNTER — Telehealth (INDEPENDENT_AMBULATORY_CARE_PROVIDER_SITE_OTHER): Payer: 59 | Admitting: Psychiatry

## 2020-01-24 DIAGNOSIS — F4310 Post-traumatic stress disorder, unspecified: Secondary | ICD-10-CM | POA: Diagnosis not present

## 2020-01-24 DIAGNOSIS — F332 Major depressive disorder, recurrent severe without psychotic features: Secondary | ICD-10-CM | POA: Diagnosis not present

## 2020-01-24 DIAGNOSIS — F4312 Post-traumatic stress disorder, chronic: Secondary | ICD-10-CM

## 2020-01-24 NOTE — BH Specialist Note (Signed)
Virtual Visit via Telephone Note  I connected with Lori Jensen on 01/24/20 at 11:30 AM EDT by telephone and verified that I am speaking with the correct person using two identifiers.  Location: Patient: home Provider: office   I discussed the limitations, risks, security and privacy concerns of performing an evaluation and management service by telephone and the availability of in person appointments. I also discussed with the patient that there may be a patient responsible charge related to this service. The patient expressed understanding and agreed to proceed.   History of Present Illness: "Things have been up and down". She started therapy last week and it was helpful. Aron was doing well with self care. She was eating healthy and walking 3 miles a day. These things were making her feel much better. Something happened and her anxiety and HV became significantly worse. She is no longer walking. She does not sleep well at night and often falls asleep until the morning time. She has 3-4 days of depression a week. She isolates and has poor energy and low motivation. Canda denies SI/HI. She does admit to on/off thoughts of death that started a few weeks ago. She has a lot of trouble taking her night meds.    Observations/Objective:  General Appearance: unable to assess  Eye Contact:  unable to assess  Speech:  Clear and Coherent and Slow  Volume:  Normal  Mood:  Anxious and Depressed  Affect:  Congruent  Thought Process:  Coherent and Descriptions of Associations: Circumstantial  Orientation:  Full (Time, Place, and Person)  Thought Content:  Logical  Suicidal Thoughts:  No  Homicidal Thoughts:  No  Memory:  Immediate;   Good  Judgement:  Good  Insight:  Good  Psychomotor Activity: unable to assess  Concentration:  Concentration: Good  Recall:  Good  Fund of Knowledge:  Good  Language:  Good  Akathisia:  unable to assess  Handed:  Right  AIMS (if indicated):     Assets:   Communication Skills Desire for [place] Talents/Skills Transportation  ADL's:  unable to assess  Cognition:  WNL  Sleep:         Assessment and Plan: MDD- recurrent, severe without psychotic features; PTSD  Cymbalta 120mg  po qD Klonopin 1mg  po TID Trazodone 100mg  po qHS Restart Lithium ER 450mg  po qD- take in AM Neurontin 300mg  po TID  She has not been taking her night meds on a regular basis. We talked about ways to improve compliance.   Encouraged to continue therapy  Follow Up Instructions: In 2 months or sooner if needed   I discussed the assessment and treatment plan with the patient. The patient was provided an opportunity to ask questions and all were answered. The patient agreed with the plan and demonstrated an understanding of the instructions.   The patient was advised to call back or seek an in-person evaluation if the symptoms worsen or if the condition fails to improve as anticipated.  I provided 15 minutes of non-face-to-face time during this encounter.   [name], MD

## 2020-03-06 ENCOUNTER — Telehealth (HOSPITAL_COMMUNITY): Payer: 59 | Admitting: Psychiatry

## 2020-03-13 ENCOUNTER — Other Ambulatory Visit: Payer: Self-pay

## 2020-03-13 ENCOUNTER — Encounter (HOSPITAL_COMMUNITY): Payer: Self-pay | Admitting: Psychiatry

## 2020-03-13 ENCOUNTER — Telehealth (INDEPENDENT_AMBULATORY_CARE_PROVIDER_SITE_OTHER): Payer: 59 | Admitting: Psychiatry

## 2020-03-13 DIAGNOSIS — F332 Major depressive disorder, recurrent severe without psychotic features: Secondary | ICD-10-CM | POA: Diagnosis not present

## 2020-03-13 DIAGNOSIS — F4312 Post-traumatic stress disorder, chronic: Secondary | ICD-10-CM | POA: Diagnosis not present

## 2020-03-13 MED ORDER — DULOXETINE HCL 60 MG PO CPEP: 120.0000 mg | ORAL_CAPSULE | Freq: Every day | ORAL | 1 refills | Status: DC

## 2020-03-13 MED ORDER — CLONAZEPAM 1 MG PO TABS: 1.0000 mg | ORAL_TABLET | Freq: Three times a day (TID) | ORAL | 1 refills | Status: DC | PRN

## 2020-03-13 MED ORDER — BUPROPION HCL ER (XL) 150 MG PO TB24: 150.0000 mg | ORAL_TABLET | Freq: Every day | ORAL | 1 refills | Status: DC

## 2020-03-13 MED ORDER — TRAZODONE HCL 100 MG PO TABS: 100.0000 mg | ORAL_TABLET | Freq: Every day | ORAL | 1 refills | Status: DC

## 2020-03-13 MED ORDER — GABAPENTIN 300 MG PO CAPS: 300.0000 mg | ORAL_CAPSULE | Freq: Three times a day (TID) | ORAL | 1 refills | Status: DC

## 2020-03-13 NOTE — Progress Notes (Signed)
Virtual Visit via Telephone Note  I connected with Lori Jensen on 03/13/20 at  4:15 PM EDT by telephone and verified that I am speaking with the correct person using two identifiers.  Location: Patient: home Provider: office   I discussed the limitations, risks, security and privacy concerns of performing an evaluation and management service by telephone and the availability of in person appointments. I also discussed with the patient that there may be a patient responsible charge related to this service. The patient expressed understanding and agreed to proceed.   History of Present Illness: Lori Jensen has not been taking the Lithium because she is fearful of the potential negative side effects. She has good days and bad days. She doesn't know how to respond to questions regarding med effect. Her sleep is a little improved with Trazodone. Her dreams are vivid and interfere with restful sleep. She gets anywhere from 4-8 hrs/night.  Energy is generally low. Her anxiety is ongoing. She worries about her son all the time. Her depression comes on/off. 1 week over the last 4 weeks she was very depressed. She had no motivation and did nothing all week. Other days she has been able to force herself to go out and run errands. Lori Jensen spends a lot of time watching tv because it gives her an escape. She is endorsing anhedonia and crying spells. She has rare, random SI without plan and intent. The thoughts have decreased significantly with recent therapy. Lori Jensen denies HI. Her PTSD is more manageable due to therapy. Her HV is high but better than a few weeks ago.    Observations/Objective:  General Appearance: unable to assess  Eye Contact:  unable to assess  Speech:  Clear and Coherent and Normal Rate  Volume:  Normal  Mood:  Anxious and Depressed  Affect:  Congruent  Thought Process:  Goal Directed and Descriptions of Associations: Intact  Orientation:  Full (Time, Place, and Person)  Thought Content:   Logical  Suicidal Thoughts:  No  Homicidal Thoughts:  No  Memory:  Immediate;   Good  Judgement:  Good  Insight:  Good  Psychomotor Activity: unable to assess  Concentration:  Concentration: Good  Recall:  Good  Fund of Knowledge:  Good  Language:  Good  Akathisia:  unable to assess  Handed:  Right  AIMS (if indicated):     Assets:  Communication Skills Desire for Improvement Financial Resources/Insurance Housing Social Support Talents/Skills Transportation Vocational/Educational  ADL's:  unable to assess  Cognition:  WNL  Sleep:         Assessment and Plan:  MDD-recurrent, severe without psychotic features; PTSD  D/c Lithium  Cymbalta 120mg  po qD  Klonopin 1mg  po TID  Trazodone 100mg  po qHS  Neurontin 300mg  po TID  Start trial of Wellbutrin XL 150mg  po qD  Encouraged to continued therapy- meeting weekly now   Follow Up Instructions: In 4-8 weeks or sooner if needed   I discussed the assessment and treatment plan with the patient. The patient was provided an opportunity to ask questions and all were answered. The patient agreed with the plan and demonstrated an understanding of the instructions.   The patient was advised to call back or seek an in-person evaluation if the symptoms worsen or if the condition fails to improve as anticipated.  I provided 20 minutes of non-face-to-face time during this encounter.   [name], MD

## 2020-04-10 ENCOUNTER — Telehealth (HOSPITAL_COMMUNITY): Payer: 59 | Admitting: Psychiatry

## 2020-04-24 ENCOUNTER — Telehealth (HOSPITAL_COMMUNITY): Payer: 59 | Admitting: Psychiatry

## 2020-04-24 ENCOUNTER — Other Ambulatory Visit: Payer: Self-pay

## 2020-05-01 ENCOUNTER — Telehealth (INDEPENDENT_AMBULATORY_CARE_PROVIDER_SITE_OTHER): Payer: 59 | Admitting: Psychiatry

## 2020-05-01 ENCOUNTER — Encounter (HOSPITAL_COMMUNITY): Payer: Self-pay | Admitting: Psychiatry

## 2020-05-01 ENCOUNTER — Other Ambulatory Visit: Payer: Self-pay

## 2020-05-01 DIAGNOSIS — F4312 Post-traumatic stress disorder, chronic: Secondary | ICD-10-CM

## 2020-05-01 DIAGNOSIS — F332 Major depressive disorder, recurrent severe without psychotic features: Secondary | ICD-10-CM | POA: Diagnosis not present

## 2020-05-01 DIAGNOSIS — F4322 Adjustment disorder with anxiety: Secondary | ICD-10-CM | POA: Diagnosis not present

## 2020-05-01 MED ORDER — GABAPENTIN 400 MG PO CAPS: 400.0000 mg | ORAL_CAPSULE | Freq: Three times a day (TID) | ORAL | 1 refills | Status: DC

## 2020-05-01 MED ORDER — TRAZODONE HCL 100 MG PO TABS: 100.0000 mg | ORAL_TABLET | Freq: Every day | ORAL | 1 refills | Status: DC

## 2020-05-01 MED ORDER — BUPROPION HCL ER (XL) 150 MG PO TB24: 150.0000 mg | ORAL_TABLET | Freq: Every day | ORAL | 1 refills | Status: DC

## 2020-05-01 MED ORDER — DULOXETINE HCL 60 MG PO CPEP: 120.0000 mg | ORAL_CAPSULE | Freq: Every day | ORAL | 1 refills | Status: DC

## 2020-05-01 MED ORDER — CLONAZEPAM 1 MG PO TABS: 1.0000 mg | ORAL_TABLET | Freq: Three times a day (TID) | ORAL | 1 refills | Status: DC | PRN

## 2020-05-01 NOTE — Progress Notes (Signed)
Virtual Visit via Video Note  I connected with Lori Jensen on 05/01/20 at  8:45 AM EDT by phone. We were unable to connect by video enabled telemedicine application and verified that I am speaking with the correct person using two identifiers.  Location: Patient: home Provider: office   I discussed the limitations of evaluation and management by telemedicine and the availability of in person appointments. The patient expressed understanding and agreed to proceed.  History of Present Illness: Lori Jensen states the Wellbutrin is helping with her depression. She is more motivated and interested in things. The Wellbutrin seems to be making her tired and she needs to take a nap during the day. She is walking and going to the gym several times a week for weight loss. She has lost 12 lbs in the last 2 months. She is sleeping well at night. Lori Jensen is having very high anxiety and it could be related to multiple family stressors. She is on edge and is tearful. She is irritable and is very emotional. Lori Jensen is having stress related panic attacks a few times a week. She is trying to use coping skills but it is not working. She denies SI/HI.  PTSD symptoms are on/off. At night she has a lot of HV. She gets nervous in new places and is avoiding crowds.   Observations/Objective:  General Appearance: unable to assess  Eye Contact:  unable to assess  Speech:  Clear and Coherent and Normal Rate  Volume:  Normal  Mood:  Anxious  Affect:  Congruent  Thought Process:  Coherent and Descriptions of Associations: Intact  Orientation:  Full (Time, Place, and Person)  Thought Content:  Rumination  Suicidal Thoughts:  No  Homicidal Thoughts:  No  Memory:  Immediate;   Good  Judgement:  Good  Insight:  Good  Psychomotor Activity: unable to assess  Concentration:  Concentration: Good  Recall:  Good  Fund of Knowledge:  Good  Language:  Good  Akathisia:  unable to assess  Handed:  Right  AIMS (if indicated):      Assets:  Communication Skills Desire for Improvement Financial Resources/Insurance Housing Talents/Skills Transportation Vocational/Educational  ADL's:  unable to assess  Cognition:  WNL  Sleep:         Assessment and Plan: MDD- recurrent, severe without psychotic features; PTSD  Increase Neurontin 400mg  po TID  Change Wellbutrin XL 150mg  po qHS due to daytime fatigue.   Cymbalta 120mg  po qD  Klonopin 1mg  po TID prn anxiety  Trazodone 100mg  po qHS  Encouraged to work on anxiety in therapy  Follow Up Instructions: In 6-10 weeks or sooner if needed   I discussed the assessment and treatment plan with the patient. The patient was provided an opportunity to ask questions and all were answered. The patient agreed with the plan and demonstrated an understanding of the instructions.   The patient was advised to call back or seek an in-person evaluation if the symptoms worsen or if the condition fails to improve as anticipated.  I provided 20 minutes of non-face-to-face time during this encounter.   [name], MD

## 2020-06-19 ENCOUNTER — Other Ambulatory Visit: Payer: Self-pay

## 2020-06-19 ENCOUNTER — Telehealth (HOSPITAL_COMMUNITY): Payer: 59 | Admitting: Psychiatry

## 2020-06-19 DIAGNOSIS — F4312 Post-traumatic stress disorder, chronic: Secondary | ICD-10-CM

## 2020-06-19 DIAGNOSIS — F332 Major depressive disorder, recurrent severe without psychotic features: Secondary | ICD-10-CM
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
## 2020-06-20 MED ORDER — DULOXETINE HCL 60 MG PO CPEP: 120.0000 mg | ORAL_CAPSULE | Freq: Every day | ORAL | 1 refills | Status: DC

## 2020-06-20 MED ORDER — GABAPENTIN 400 MG PO CAPS: 400.0000 mg | ORAL_CAPSULE | Freq: Three times a day (TID) | ORAL | 1 refills | Status: DC

## 2020-06-20 MED ORDER — BUPROPION HCL ER (XL) 150 MG PO TB24: 150.0000 mg | ORAL_TABLET | Freq: Every day | ORAL | 1 refills | Status: DC

## 2020-06-20 MED ORDER — TRAZODONE HCL 100 MG PO TABS: 100.0000 mg | ORAL_TABLET | Freq: Every day | ORAL | 1 refills | Status: DC

## 2020-06-20 MED ORDER — CLONAZEPAM 1 MG PO TABS: 1.0000 mg | ORAL_TABLET | Freq: Three times a day (TID) | ORAL | 1 refills | Status: DC | PRN

## 2020-08-07 ENCOUNTER — Telehealth (HOSPITAL_COMMUNITY): Payer: 59 | Admitting: Psychiatry

## 2020-08-07 ENCOUNTER — Other Ambulatory Visit: Payer: Self-pay

## 2020-08-07 DIAGNOSIS — Z5329 Procedure and treatment not carried out because of patient's decision for other reasons: Secondary | ICD-10-CM

## 2020-08-07 NOTE — Progress Notes (Unsigned)
I called Lori Jensen  at our scheduled appointment time twice. There was no answer. I left a voice messaging asking the patient to call the clinic and reschedule the appointment. I was unable to speak with Lori Jensen today.

## 2020-12-10 ENCOUNTER — Other Ambulatory Visit (HOSPITAL_COMMUNITY): Payer: Self-pay | Admitting: Psychiatry

## 2020-12-10 DIAGNOSIS — F332 Major depressive disorder, recurrent severe without psychotic features: Secondary | ICD-10-CM

## 2020-12-10 DIAGNOSIS — F4312 Post-traumatic stress disorder, chronic: Secondary | ICD-10-CM

## 2020-12-12 ENCOUNTER — Telehealth (HOSPITAL_COMMUNITY): Payer: Self-pay | Admitting: *Deleted

## 2020-12-12 NOTE — Telephone Encounter (Signed)
Pt called asking for refill of the Cymbalta 60 mg (120mg  total) which has not been filled since 10/21. Pt was last seen 06/19/20. No appointment on the books. Please review and advise. Thanks.

## 2020-12-16 ENCOUNTER — Other Ambulatory Visit (HOSPITAL_COMMUNITY): Payer: Self-pay | Admitting: *Deleted

## 2020-12-16 DIAGNOSIS — F332 Major depressive disorder, recurrent severe without psychotic features: Secondary | ICD-10-CM

## 2020-12-16 DIAGNOSIS — F4312 Post-traumatic stress disorder, chronic: Secondary | ICD-10-CM

## 2020-12-16 MED ORDER — DULOXETINE HCL 60 MG PO CPEP: 120.0000 mg | ORAL_CAPSULE | Freq: Every day | ORAL | 0 refills | Status: DC

## 2020-12-18 ENCOUNTER — Other Ambulatory Visit: Payer: Self-pay

## 2020-12-18 ENCOUNTER — Telehealth (INDEPENDENT_AMBULATORY_CARE_PROVIDER_SITE_OTHER): Payer: 59 | Admitting: Psychiatry

## 2020-12-18 DIAGNOSIS — F4312 Post-traumatic stress disorder, chronic: Secondary | ICD-10-CM

## 2020-12-18 DIAGNOSIS — F99 Mental disorder, not otherwise specified: Secondary | ICD-10-CM

## 2020-12-18 DIAGNOSIS — F332 Major depressive disorder, recurrent severe without psychotic features: Secondary | ICD-10-CM

## 2020-12-18 DIAGNOSIS — F5105 Insomnia due to other mental disorder: Secondary | ICD-10-CM

## 2020-12-18 MED ORDER — CLONAZEPAM 1 MG PO TABS: 1.0000 mg | ORAL_TABLET | Freq: Two times a day (BID) | ORAL | 1 refills | Status: DC | PRN

## 2020-12-18 MED ORDER — DULOXETINE HCL 60 MG PO CPEP: 120.0000 mg | ORAL_CAPSULE | Freq: Every day | ORAL | 1 refills | Status: DC

## 2020-12-18 MED ORDER — BUPROPION HCL ER (XL) 300 MG PO TB24: 300.0000 mg | ORAL_TABLET | Freq: Every day | ORAL | 1 refills | Status: DC

## 2020-12-18 MED ORDER — TRAZODONE HCL 100 MG PO TABS: 100.0000 mg | ORAL_TABLET | Freq: Every day | ORAL | 1 refills | Status: DC

## 2020-12-18 NOTE — Progress Notes (Signed)
Virtual Visit via Telephone Note  I connected with Lori Jensen on 12/18/20 at 10:45 AM EDT by telephone and verified that I am speaking with the correct person using two identifiers.  Location: Patient: home Provider: office   I discussed the limitations, risks, security and privacy concerns of performing an evaluation and management service by telephone and the availability of in person appointments. I also discussed with the patient that there may be a patient responsible charge related to this service. The patient expressed understanding and agreed to proceed.   History of Present Illness: Lori Jensen had oral surgery earlier this week. Lori Jensen is in a lot of pain and is not sleeping well. In January Lori Jensen got sick with COVID for 3 weeks. In February Lori Jensen noticed that her depression had significantly worsened. It was upsetting because the depression had been responding Wellbutrin and Cymbalta. Since February it has been rough. At times Lori Jensen was having passive thoughts of death. Lori Jensen ran out of the Cymbalta last week and noticed a significant increase in pain and worsening depression. Over the last 2 weeks her depression is occurring daily. Lori Jensen is endorsing anhedonia. Her sleep is variable and Lori Jensen is tired all the time. Lori Jensen is making an effort to eat healthier instead of eating due to her emotions. Her concentration is poor. Lori Jensen denies SI/HI. Lori Jensen denies passive thought of death. Lori Jensen is using guided journals to help with her mood and it is helping a lot. Her PTSD seems to be well controlled until Lori Jensen ran out of the Cymbalta last week. Her HV is increased. Lori Jensen notes some random VH out of the corner of her eye. Lori Jensen is taking Klonopin 1x/day and rarely 2x/day if needed due to excessive anxiety. Her anxiety is ok as long as Lori Jensen isolates. Lori Jensen is overwhelmed easily and that makes her especially anxious.    Observations/Objective:  General Appearance: unable to assess  Eye Contact:  unable to assess   Speech:  Clear and Coherent and Normal Rate  Volume:  Normal  Mood:  Depressed  Affect:  Congruent  Thought Process:  Coherent, Linear and Descriptions of Associations: Circumstantial  Orientation:  Full (Time, Place, and Person)  Thought Content:  Logical  Suicidal Thoughts:  No  Homicidal Thoughts:  No  Memory:  Immediate;   Good  Judgement:  Good  Insight:  Good  Psychomotor Activity: unable to assess  Concentration:  Concentration: Good  Recall:  Good  Fund of Knowledge:  Good  Language:  Good  Akathisia:  unable to assess  Handed:  Right  AIMS (if indicated):     Assets:  Communication Skills Desire for Improvement Financial Resources/Insurance Housing Resilience Social Support Talents/Skills Transportation Vocational/Educational  ADL's:  unable to assess  Cognition:  WNL  Sleep:         Assessment and Plan:  1. Severe recurrent major depression without psychotic features (HCC) - DULoxetine (CYMBALTA) 60 MG capsule; Take 2 capsules (120 mg total) by mouth daily.  Dispense: 60 capsule; Refill: 1 - increase buPROPion (WELLBUTRIN XL) 300 MG 24 hr tablet; Take 1 tablet (300 mg total) by mouth daily.  Dispense: 30 tablet; Refill: 1 - traZODone (DESYREL) 100 MG tablet; Take 1 tablet (100 mg total) by mouth at bedtime.  Dispense: 30 tablet; Refill: 1  2. Chronic post-traumatic stress disorder (PTSD) - DULoxetine (CYMBALTA) 60 MG capsule; Take 2 capsules (120 mg total) by mouth daily.  Dispense: 60 capsule; Refill: 1 - clonazePAM (KLONOPIN) 1 MG tablet; Take 1  tablet (1 mg total) by mouth 2 (two) times daily as needed for anxiety.  Dispense: 60 tablet; Refill: 1  3. Insomnia due to other mental disorder - traZODone (DESYREL) 100 MG tablet; Take 1 tablet (100 mg total) by mouth at bedtime.  Dispense: 30 tablet; Refill: 1    Depression screen [facility] 2/9 12/18/2020 10/04/2017 09/08/2017 03/04/2017 02/22/2017  Decreased Interest 3 3 3 2 3   Down, Depressed, Hopeless 3 3 3 2 3    PHQ - 2 Score 6 6 6 4 6   Altered sleeping 3 2 2 1 3   Tired, decreased energy 3 3 3 2 3   Change in appetite 0 3 3 2 3   Feeling bad or failure about yourself  3 3 3 2 3   Trouble concentrating 1 3 3 1 3   Moving slowly or fidgety/restless 0 1 3 1 2   Suicidal thoughts 0 2 2 1 2   PHQ-9 Score 16 23 25 14 25   Difficult doing work/chores Very difficult - - - -  Some recent data might be hidden    Flowsheet Row Video Visit from 12/18/2020 in [facility]  C-SSRS RISK CATEGORY No Risk       Follow Up Instructions: In 2 months or sooner if needed   I discussed the assessment and treatment plan with the patient. The patient was provided an opportunity to ask questions and all were answered. The patient agreed with the plan and demonstrated an understanding of the instructions.   The patient was advised to call back or seek an in-person evaluation if the symptoms worsen or if the condition fails to improve as anticipated.  I provided 19 minutes of non-face-to-face time during this encounter.   [name], MD

## 2021-02-05 ENCOUNTER — Other Ambulatory Visit: Payer: Self-pay

## 2021-02-05 ENCOUNTER — Telehealth (INDEPENDENT_AMBULATORY_CARE_PROVIDER_SITE_OTHER): Payer: 59 | Admitting: Psychiatry

## 2021-02-05 DIAGNOSIS — F5105 Insomnia due to other mental disorder: Secondary | ICD-10-CM

## 2021-02-05 DIAGNOSIS — F99 Mental disorder, not otherwise specified: Secondary | ICD-10-CM

## 2021-02-05 DIAGNOSIS — F4312 Post-traumatic stress disorder, chronic: Secondary | ICD-10-CM

## 2021-02-05 DIAGNOSIS — F332 Major depressive disorder, recurrent severe without psychotic features: Secondary | ICD-10-CM

## 2021-02-05 MED ORDER — GABAPENTIN 400 MG PO CAPS: 400.0000 mg | ORAL_CAPSULE | Freq: Three times a day (TID) | ORAL | 1 refills | Status: DC

## 2021-02-05 MED ORDER — CLONAZEPAM 1 MG PO TABS: 1.0000 mg | ORAL_TABLET | Freq: Two times a day (BID) | ORAL | 1 refills | Status: DC | PRN

## 2021-02-05 MED ORDER — BUPROPION HCL ER (XL) 150 MG PO TB24: 450.0000 mg | ORAL_TABLET | ORAL | 0 refills | Status: DC

## 2021-02-05 MED ORDER — TRAZODONE HCL 150 MG PO TABS
150.0000 mg | ORAL_TABLET | Freq: Every day | ORAL | 0 refills | Status: DC
Start: 2021-02-05 — End: 2021-07-09

## 2021-02-05 MED ORDER — DULOXETINE HCL 60 MG PO CPEP: 120.0000 mg | ORAL_CAPSULE | Freq: Every day | ORAL | 0 refills | Status: DC

## 2021-02-05 NOTE — Progress Notes (Signed)
Virtual Visit via Video Note  I connected with Lori Jensen on 02/05/21 at  1:00 PM EDT by a video enabled telemedicine application and verified that I am speaking with the correct person using two identifiers.  Location: Patient: home Provider: office   I discussed the limitations of evaluation and management by telemedicine and the availability of in person appointments. The patient expressed understanding and agreed to proceed.  History of Present Illness: Lori Jensen shares that the increase in Wellbutrin has helped. She has been depressed for about 5 days out of 14. She has low motivation, anhedonia, poor hygiene, low energy and negative self talk most days. She is feeling overly emotional and can cry easily. She really wants to restart therapy but her new insurance will only cover therapy with a psychologist. She was forced to stop therapy with her old therapist. Lori Jensen can really tell her depression has been affected by the lack of therapy.  She used to exercise but lack of transportation has significantly affected her motivation to exercise. She has gained weight. It makes her feel bad about herself and makes her anxious about interacting with other people. Lori Jensen feels she gets anxious easily. She is an emotional eater but her food choices are not that bad. Thoughts of death stopped a couple of months ago. She denies SI/HI.  Her PTSD has not been that bad lately. Health wise- she had an abnormal PAP smear and needs a biopsy. In addition she has a boil in an embarrassing place and does not want to have surgery.    Observations/Objective: Psychiatric Specialty Exam: ROS  There were no vitals taken for this visit.There is no height or weight on file to calculate BMI.  General Appearance: Casual  Eye Contact:  Good  Speech:  Clear and Coherent and Normal Rate  Volume:  Normal  Mood:  Anxious and Depressed  Affect:  Congruent, Depressed and Tearful  Thought Process:  Goal Directed, Linear  and Descriptions of Associations: Intact  Orientation:  Full (Time, Place, and Person)  Thought Content:  Logical  Suicidal Thoughts:  No  Homicidal Thoughts:  No  Memory:  Immediate;   Good  Judgement:  Good  Insight:  Good  Psychomotor Activity:  Normal  Concentration:  Concentration: Good  Recall:  Good  Fund of Knowledge:  Good  Language:  Good  Akathisia:  No  Handed:  Right  AIMS (if indicated):     Assets:  Communication Skills Desire for Improvement Financial Resources/Insurance Housing Leisure Time Resilience Social Support Talents/Skills Transportation Vocational/Educational  ADL's:  Intact  Cognition:  WNL  Sleep:        Assessment and Plan: Depression screen [facility] 2/9 02/05/2021 12/18/2020 10/04/2017 09/08/2017 03/04/2017  Decreased Interest 3 3 3 3 2   Down, Depressed, Hopeless 2 3 3 3 2   PHQ - 2 Score 5 6 6 6 4   Altered sleeping 1 3 2 2 1   Tired, decreased energy 3 3 3 3 2   Change in appetite 1 0 3 3 2   Feeling bad or failure about yourself  3 3 3 3 2   Trouble concentrating 3 1 3 3 1   Moving slowly or fidgety/restless 0 0 1 3 1   Suicidal thoughts 0 0 2 2 1   PHQ-9 Score 16 16 23 25 14   Difficult doing work/chores Very difficult Very difficult - - -  Some recent data might be hidden    Flowsheet Row Video Visit from 02/05/2021 in [facility] Video  Visit from 12/18/2020 in [place] No Risk No Risk     - referred to psychologist - increase Wellbutrin - increase Trazodone  1. Severe recurrent major depression without psychotic features (HCC) - buPROPion (WELLBUTRIN XL) 150 MG 24 hr tablet; Take 3 tablets (450 mg total) by mouth every morning.  Dispense: 270 tablet; Refill: 0 - DULoxetine (CYMBALTA) 60 MG capsule; Take 2 capsules (120 mg total) by mouth daily.  Dispense: 180 capsule; Refill: 0 - traZODone (DESYREL) 150 MG tablet; Take 1 tablet (150 mg  total) by mouth at bedtime.  Dispense: 90 tablet; Refill: 0  2. Chronic post-traumatic stress disorder (PTSD) - clonazePAM (KLONOPIN) 1 MG tablet; Take 1 tablet (1 mg total) by mouth 2 (two) times daily as needed for anxiety.  Dispense: 60 tablet; Refill: 1 - DULoxetine (CYMBALTA) 60 MG capsule; Take 2 capsules (120 mg total) by mouth daily.  Dispense: 180 capsule; Refill: 0 - gabapentin (NEURONTIN) 400 MG capsule; Take 1 capsule (400 mg total) by mouth 3 (three) times daily.  Dispense: 90 capsule; Refill: 1  3. Insomnia due to other mental disorder - traZODone (DESYREL) 150 MG tablet; Take 1 tablet (150 mg total) by mouth at bedtime.  Dispense: 90 tablet; Refill: 0     Follow Up Instructions: In 1-2 months or sooner if needed   I discussed the assessment and treatment plan with the patient. The patient was provided an opportunity to ask questions and all were answered. The patient agreed with the plan and demonstrated an understanding of the instructions.   The patient was advised to call back or seek an in-person evaluation if the symptoms worsen or if the condition fails to improve as anticipated.   [name], MD

## 2021-02-11 ENCOUNTER — Telehealth (HOSPITAL_COMMUNITY): Payer: Self-pay | Admitting: Psychiatry

## 2021-04-02 ENCOUNTER — Telehealth (HOSPITAL_COMMUNITY): Payer: 59 | Admitting: Psychiatry

## 2021-05-07 ENCOUNTER — Telehealth (HOSPITAL_COMMUNITY): Payer: Medicare (Managed Care) | Admitting: Psychiatry

## 2021-05-07 ENCOUNTER — Telehealth (HOSPITAL_COMMUNITY): Payer: Self-pay | Admitting: Psychiatry

## 2021-05-07 ENCOUNTER — Other Ambulatory Visit: Payer: Self-pay

## 2021-05-07 NOTE — Telephone Encounter (Signed)
I called the patient at our scheduled appointment time. There was no answer. I left a voice message for patient to call the clinic back at their convinence.   

## 2021-07-09 ENCOUNTER — Other Ambulatory Visit: Payer: Self-pay

## 2021-07-09 ENCOUNTER — Telehealth (HOSPITAL_BASED_OUTPATIENT_CLINIC_OR_DEPARTMENT_OTHER): Payer: Medicare (Managed Care) | Admitting: Psychiatry

## 2021-07-09 DIAGNOSIS — F99 Mental disorder, not otherwise specified: Secondary | ICD-10-CM

## 2021-07-09 DIAGNOSIS — F4312 Post-traumatic stress disorder, chronic: Secondary | ICD-10-CM | POA: Diagnosis not present

## 2021-07-09 DIAGNOSIS — F3181 Bipolar II disorder: Secondary | ICD-10-CM | POA: Diagnosis not present

## 2021-07-09 DIAGNOSIS — F5105 Insomnia due to other mental disorder: Secondary | ICD-10-CM

## 2021-07-09 MED ORDER — TRAZODONE HCL 150 MG PO TABS: 150.0000 mg | ORAL_TABLET | Freq: Every day | ORAL | 0 refills | Status: DC

## 2021-07-09 MED ORDER — GABAPENTIN 400 MG PO CAPS: 400.0000 mg | ORAL_CAPSULE | Freq: Three times a day (TID) | ORAL | 1 refills | Status: DC

## 2021-07-09 MED ORDER — LAMOTRIGINE 25 MG PO TABS: ORAL_TABLET | ORAL | 0 refills | Status: DC

## 2021-07-09 MED ORDER — BUPROPION HCL ER (XL) 150 MG PO TB24: 450.0000 mg | ORAL_TABLET | ORAL | 0 refills | Status: DC

## 2021-07-09 MED ORDER — DULOXETINE HCL 30 MG PO CPEP: ORAL_CAPSULE | ORAL | 0 refills | Status: DC

## 2021-07-09 MED ORDER — CLONAZEPAM 1 MG PO TABS: 1.0000 mg | ORAL_TABLET | Freq: Two times a day (BID) | ORAL | 1 refills | Status: DC | PRN

## 2021-07-09 NOTE — Progress Notes (Signed)
Virtual Visit via Video Note  I connected with Lori Jensen on 07/09/21 at 11:00 AM EDT by a video enabled telemedicine application and verified that I am speaking with the correct person using two identifiers.  Location: Patient: home Provider: office   I discussed the limitations of evaluation and management by telemedicine and the availability of in person appointments. The patient expressed understanding and agreed to proceed.  History of Present Illness: "I'm hanging in there I guess". She finally found a licensed psychologist. They have a few sessions and it seems like a good fit. He asked that she talk to me about her starting a mood stabilizer for potential Bipolar 2 disorder. He asked her a lot of questions and she doesn't recall exactly what lead him to that diagnosis. She has major social anxiety and rarely leave the house except for doctor or therapy visits. Lori Jensen enjoys spending money and it makes her feel better. At times she spends money she doesn't have. She gets paid once a month and she will spend it. She is behind once a month but is able to pay her bills. Her friend will give her money for tattoos and other things. Lori Jensen states she would over spend and get in trouble if her friend didn't give her money. She spends about $500 during the time. She loves shopping and spending money and it makes her feel happy. She didn't have any tattoos at the beginning of the year and now she has a lot. Lori Jensen states she is looking for the pain involved in getting a tattoo. Her sleep has been variable. Until recently her days and nights were switched. For the last 3 nights she has been sleeping at night. She has significant nightmares when she sleeps at night as compared to the day.Trazodone allows her to sleep longer when she takes it.  Lori Jensen is binging and sometimes wakes up with food on her bed that she doesn't remember getting. At times she has rapid speech and will dominate conversations. It  lasts for about 1 day. During this time she is anxious and restless to talk to others and is generally up and out of her room more. This is unusual because she hardly talks to friends or family and has more goal directed behaviors. Her mood is generally up. This happens for 1-2 days a month or sometimes 1-2 days every few months. It feels like she is having this now. She called her sister and friend last night and she talked for several hours. Her mood is a little better and she feels like her energy is improved. She didn't sleep last night and doesn't feel tired today.   Lori Jensen's Lori Jensen is pregnant but she is having complications. Lori Jensen is worried about her. Lori Jensen is endorsing depression and is isolating in room most days. Lori Jensen has a sense of worthlessness because other people ask her when she is going back to work. She doesn't think she will ever be able to go back to work due to depression. Some days she has passive thoughts of death to get away from her ongoing pain. Lori Jensen will not showered or changed her clothes in days. She has been picking at her skin and will peel her nails down. Lori Jensen denies SI/HI.    Observations/Objective: Psychiatric Specialty Exam: ROS  There were no vitals taken for this visit.There is no height or weight on file to calculate BMI.  General Appearance: Casual and Neat  Eye Contact:  Good  Speech:  Clear  and Coherent and Normal Rate  Volume:  Normal  Mood:  Anxious and Depressed  Affect:  Depressed, Labile, and Tearful  Thought Process:  Goal Directed, Linear, and Descriptions of Associations: Intact  Orientation:  Full (Time, Place, and Person)  Thought Content:  Logical  Suicidal Thoughts:  No  Homicidal Thoughts:  No  Memory:  Immediate;   Good  Judgement:  Good  Insight:  Good  Psychomotor Activity:  Normal  Concentration:  Concentration: Good  Recall:  Good  Fund of Knowledge:  Good  Language:  Good  Akathisia:  No  Handed:  Right  AIMS (if  indicated):     Assets:  Communication Skills Desire for Improvement Financial Resources/Insurance Housing Resilience Talents/Skills Transportation Vocational/Educational  ADL's:  Intact  Cognition:  WNL  Sleep:        Assessment and Plan:  -start slow titration of Lamictal for Bipolar disorder  - slow taper off of Cymbalta  1. Bipolar 2 disorder (HCC) - buPROPion (WELLBUTRIN XL) 150 MG 24 hr tablet; Take 3 tablets (450 mg total) by mouth every morning.  Dispense: 270 tablet; Refill: 0 - gabapentin (NEURONTIN) 400 MG capsule; Take 1 capsule (400 mg total) by mouth 3 (three) times daily.  Dispense: 90 capsule; Refill: 1 - lamoTRIgine (LAMICTAL) 25 MG tablet; Take 1 tablet (25 mg total) by mouth daily for 14 days, THEN 1 tablet (25 mg total) daily.  Dispense: 44 tablet; Refill: 0  2. Chronic post-traumatic stress disorder (PTSD) - gabapentin (NEURONTIN) 400 MG capsule; Take 1 capsule (400 mg total) by mouth 3 (three) times daily.  Dispense: 90 capsule; Refill: 1 - clonazePAM (KLONOPIN) 1 MG tablet; Take 1 tablet (1 mg total) by mouth 2 (two) times daily as needed for anxiety.  Dispense: 60 tablet; Refill: 1 - DULoxetine (CYMBALTA) 30 MG capsule; Take 3 capsules (90 mg total) by mouth daily for 7 days, THEN 2 capsules (60 mg total) daily for 7 days, THEN 1 capsule (30 mg total) daily for 7 days.  Dispense: 42 capsule; Refill: 0  3. Insomnia due to other mental disorder - traZODone (DESYREL) 150 MG tablet; Take 1 tablet (150 mg total) by mouth at bedtime.  Dispense: 90 tablet; Refill: 0   Follow Up Instructions: In 1-2 months or sooner if needed   I discussed the assessment and treatment plan with the patient. The patient was provided an opportunity to ask questions and all were answered. The patient agreed with the plan and demonstrated an understanding of the instructions.   The patient was advised to call back or seek an in-person evaluation if the symptoms worsen or if the  condition fails to improve as anticipated.  I provided 31 minutes of non-face-to-face time during this encounter.   Lori Cradle, MD

## 2021-07-15 ENCOUNTER — Telehealth (HOSPITAL_COMMUNITY): Payer: Self-pay

## 2021-07-15 NOTE — Telephone Encounter (Signed)
Patient called and stated that she's tapering off her Duloxetine 30mg  and going to start Lamotrigine 25mg . Patient wants to know if she should start the new medication at the same time she's tapering off the Duloxetine or start the new one after she's off the Duloxetine? Please review and advise. Thank you

## 2021-07-17 ENCOUNTER — Telehealth (HOSPITAL_COMMUNITY): Payer: Self-pay

## 2021-07-17 NOTE — Telephone Encounter (Signed)
RELAYED MESSAGE TO PATIENT

## 2021-07-30 ENCOUNTER — Telehealth (HOSPITAL_COMMUNITY): Payer: Medicare (Managed Care) | Admitting: Psychiatry

## 2021-08-28 ENCOUNTER — Telehealth (HOSPITAL_COMMUNITY): Payer: Self-pay | Admitting: *Deleted

## 2021-08-28 NOTE — Telephone Encounter (Incomplete Revision)
Pt called stating that she has developed a rash on her "genital area" since starting Lamictal. Rash started a few days ago she says. Pt has discontinued medication and is asking about starting a different mood stabilizer. Pt has an appointment upcoming on 09/03/21. Please review and advise.

## 2021-08-28 NOTE — Telephone Encounter (Signed)
Pt called stating that she has developed a rash since starting Lamictal. Pt has discontinued medication and is asking about starting a different mood stabilizer. Pt has an appointment upcoming on 09/03/21. Please review and advise.

## 2021-08-28 NOTE — Telephone Encounter (Signed)
After speaking with Dr. Doyne Keel this nurse called pt to advise that she see her GYN, PCP, or Urgent Care to examine rash. Pt also stated that she thought that she had a bit of a "bumpy, itchy spot" on her shoulder. Writer initiated education on SJS. Pt verbalizes understanding and states she will make an appointment to see her PCP as soon as possible.

## 2021-09-03 ENCOUNTER — Other Ambulatory Visit: Payer: Self-pay

## 2021-09-03 ENCOUNTER — Telehealth (HOSPITAL_BASED_OUTPATIENT_CLINIC_OR_DEPARTMENT_OTHER): Payer: Medicare (Managed Care) | Admitting: Psychiatry

## 2021-09-03 DIAGNOSIS — F3181 Bipolar II disorder: Secondary | ICD-10-CM

## 2021-09-03 DIAGNOSIS — F99 Mental disorder, not otherwise specified: Secondary | ICD-10-CM | POA: Diagnosis not present

## 2021-09-03 DIAGNOSIS — F5105 Insomnia due to other mental disorder: Secondary | ICD-10-CM

## 2021-09-03 DIAGNOSIS — F4312 Post-traumatic stress disorder, chronic: Secondary | ICD-10-CM

## 2021-09-03 MED ORDER — GABAPENTIN 400 MG PO CAPS: 400.0000 mg | ORAL_CAPSULE | Freq: Three times a day (TID) | ORAL | 0 refills | Status: DC

## 2021-09-03 MED ORDER — TRAZODONE HCL 150 MG PO TABS: 150.0000 mg | ORAL_TABLET | Freq: Every day | ORAL | 0 refills | Status: DC

## 2021-09-03 MED ORDER — CLONAZEPAM 1 MG PO TABS: 1.0000 mg | ORAL_TABLET | Freq: Two times a day (BID) | ORAL | 0 refills | Status: DC | PRN

## 2021-09-03 MED ORDER — BUPROPION HCL ER (XL) 150 MG PO TB24: 450.0000 mg | ORAL_TABLET | ORAL | 0 refills | Status: DC

## 2021-09-03 MED ORDER — ARIPIPRAZOLE 5 MG PO TABS: 5.0000 mg | ORAL_TABLET | Freq: Every day | ORAL | 0 refills | Status: DC

## 2021-09-03 NOTE — Progress Notes (Signed)
Virtual Visit via Video Note  I connected with Lori Jensen on 09/03/21 at  8:30 AM EST by a video enabled telemedicine application and verified that I am speaking with the correct person using two identifiers.  Location: Patient: home Provider: office   I discussed the limitations of evaluation and management by telemedicine and the availability of in person appointments. The patient expressed understanding and agreed to proceed.  History of Present Illness: Lori Jensen reports she had a developed a rash around her bottom that went away as soon as she stopped Lamictal. The rash on her arm is responding well to the cream given to her by her dermatologist. She is sleeping better off the Cymbalta. Overall she doesn't feel like she is doing well. Lori Jensen feels overwhelmed and like she can't handle things on a daily basis. She sometimes wants to give up. A few times she felt like she should go the hospital but her responsibilities at home. In the back of her mind she was worried she would end of dying but denies any passive thoughts of death and SI/HI. Lori Jensen is not binging but she has gained a lot of weight in the last few weeks. She has gained 10lbs in the last few weeks but even before she has gained 40 lbs total over the last 1.5 yrs. Her irritability and anger are ongoing. At times she feels more social and talkative for about 1 day and her energy is increased. She feels completely different for that one day. It has occurred about 1-2 times since our last visit.  The next day her neck and shoulders hurt so much. Most days she feels numb and depressed. She is crying more and feels out of touch with everything. She is endorsing isolation and anhedonia. Her negative self talk is ongoing. She has been spending most of her time in her bed lately. Lori Jensen is working on it with her therapist but then her stressors increased and she couldn't cope. Her PTSD is unchanged. Lori Jensen has noticed she is seeing more  shadows. She has increased hypervigilance when alone at home.    Observations/Objective: Psychiatric Specialty Exam: ROS  There were no vitals taken for this visit.There is no height or weight on file to calculate BMI.  General Appearance: Casual  Eye Contact:  Good  Speech:  Clear and Coherent and Normal Rate  Volume:  Normal  Mood:  Anxious and Depressed  Affect:  Congruent, Depressed, and Tearful  Thought Process:  Goal Directed, Linear, and Descriptions of Associations: Intact  Orientation:  Full (Time, Place, and Person)  Thought Content:  Logical  Suicidal Thoughts:  No  Homicidal Thoughts:  No  Memory:  Immediate;   Good  Judgement:  Good  Insight:  Good  Psychomotor Activity:  Normal  Concentration:  Concentration: Good  Recall:  Good  Fund of Knowledge:  Good  Language:  Good  Akathisia:  No  Handed:  Right  AIMS (if indicated):     Assets:  Communication Skills Desire for Improvement Financial Resources/Insurance Housing Resilience Social Support Talents/Skills Vocational/Educational  ADL's:  Intact  Cognition:  WNL  Sleep:        Assessment and Plan: Depression screen [facility] 2/9 09/03/2021 02/05/2021 12/18/2020 10/04/2017 09/08/2017  Decreased Interest 3 3 3 3 3   Down, Depressed, Hopeless 3 2 3 3 3   PHQ - 2 Score 6 5 6 6 6   Altered sleeping 0 1 3 2 2   Tired, decreased energy 1 3 3 3  3  Change in appetite 0 1 0 3 3  Feeling bad or failure about yourself  3 3 3 3 3   Trouble concentrating 3 3 1 3 3   Moving slowly or fidgety/restless 1 0 0 1 3  Suicidal thoughts 0 0 0 2 2  PHQ-9 Score 14 16 16 23 25   Difficult doing work/chores Extremely dIfficult Very difficult Very difficult - -  Some recent data might be hidden    Flowsheet Row Video Visit from 09/03/2021 in [facility] Video Visit from 02/05/2021 in [facility] Video Visit from 12/18/2020 in [facility] No Risk No Risk No Risk      - d/c Lamictal as she had devoped a rash and she stopped it on 08/27/21. Her rash has improved on her arm and resolved in some areas like her bottom.  - Pt has appointment with her PCP in March 2023. She will set up an appointment with her PCP now to get a baseline EKG before starting Abilify  - start Abilify 5mg  po qD after EKG   1. Bipolar 2 disorder (HCC) - buPROPion (WELLBUTRIN XL) 150 MG 24 hr tablet; Take 3 tablets (450 mg total) by mouth every morning.  Dispense: 270 tablet; Refill: 0 - gabapentin (NEURONTIN) 400 MG capsule; Take 1 capsule (400 mg total) by mouth 3 (three) times daily.  Dispense: 90 capsule; Refill: 0 - ARIPiprazole (ABILIFY) 5 MG tablet; Take 1 tablet (5 mg total) by mouth daily.  Dispense: 30 tablet; Refill: 0  2. Chronic post-traumatic stress disorder (PTSD) - clonazePAM (KLONOPIN) 1 MG tablet; Take 1 tablet (1 mg total) by mouth 2 (two) times daily as needed for anxiety.  Dispense: 60 tablet; Refill: 0 - gabapentin (NEURONTIN) 400 MG capsule; Take 1 capsule (400 mg total) by mouth 3 (three) times daily.  Dispense: 90 capsule; Refill: 0  3. Insomnia due to other mental disorder - traZODone (DESYREL) 150 MG tablet; Take 1 tablet (150 mg total) by mouth at bedtime.  Dispense: 90 tablet; Refill: 0   Follow Up Instructions: In 1-3 weeks or sooner if needed   I discussed the assessment and treatment plan with the patient. The patient was provided an opportunity to ask questions and all were answered. The patient agreed with the plan and demonstrated an understanding of the instructions.   The patient was advised to call back or seek an in-person evaluation if the symptoms worsen or if the condition fails to improve as anticipated.  I provided 25 minutes of non-face-to-face time during this encounter.   [name], MD

## 2021-09-19 ENCOUNTER — Encounter (HOSPITAL_COMMUNITY): Payer: Self-pay

## 2021-09-19 ENCOUNTER — Emergency Department (HOSPITAL_COMMUNITY): Payer: Medicare (Managed Care)

## 2021-09-19 ENCOUNTER — Other Ambulatory Visit: Payer: Self-pay

## 2021-09-19 ENCOUNTER — Emergency Department (HOSPITAL_COMMUNITY)
Admission: EM | Admit: 2021-09-19 | Discharge: 2021-09-19 | Disposition: A | Discharge status: Home / Self Care | Payer: Medicare (Managed Care) | Attending: Emergency Medicine | Admitting: Emergency Medicine

## 2021-09-19 DIAGNOSIS — Z87891 Personal history of nicotine dependence: Secondary | ICD-10-CM | POA: Insufficient documentation

## 2021-09-19 DIAGNOSIS — Z79899 Other long term (current) drug therapy: Secondary | ICD-10-CM | POA: Diagnosis not present

## 2021-09-19 DIAGNOSIS — R1013 Epigastric pain: Secondary | ICD-10-CM | POA: Insufficient documentation

## 2021-09-19 DIAGNOSIS — R110 Nausea: Secondary | ICD-10-CM | POA: Insufficient documentation

## 2021-09-19 DIAGNOSIS — R079 Chest pain, unspecified: Secondary | ICD-10-CM | POA: Diagnosis not present

## 2021-09-19 DIAGNOSIS — I10 Essential (primary) hypertension: Secondary | ICD-10-CM | POA: Diagnosis not present

## 2021-09-19 DIAGNOSIS — K2100 Gastro-esophageal reflux disease with esophagitis, without bleeding: Secondary | ICD-10-CM

## 2021-09-19 LAB — CBC WITH DIFFERENTIAL/PLATELET
Abs Immature Granulocytes: 0.01 10*3/uL (ref 0.00–0.07)
Basophils Absolute: 0 10*3/uL (ref 0.0–0.1)
Basophils Relative: 0 %
Eosinophils Absolute: 0.4 10*3/uL (ref 0.0–0.5)
Eosinophils Relative: 5 %
HCT: 47.1 % — ABNORMAL HIGH (ref 36.0–46.0)
Hemoglobin: 15.4 g/dL — ABNORMAL HIGH (ref 12.0–15.0)
Immature Granulocytes: 0 %
Lymphocytes Relative: 40 %
Lymphs Abs: 2.8 10*3/uL (ref 0.7–4.0)
MCH: 28.2 pg (ref 26.0–34.0)
MCHC: 32.7 g/dL (ref 30.0–36.0)
MCV: 86.1 fL (ref 80.0–100.0)
Monocytes Absolute: 0.5 10*3/uL (ref 0.1–1.0)
Monocytes Relative: 8 %
Neutro Abs: 3.2 10*3/uL (ref 1.7–7.7)
Neutrophils Relative %: 47 %
Platelets: 230 10*3/uL (ref 150–400)
RBC: 5.47 MIL/uL — ABNORMAL HIGH (ref 3.87–5.11)
RDW: 12.9 % (ref 11.5–15.5)
WBC: 6.9 10*3/uL (ref 4.0–10.5)
nRBC: 0 % (ref 0.0–0.2)

## 2021-09-19 LAB — URINALYSIS, ROUTINE W REFLEX MICROSCOPIC
Bilirubin Urine: NEGATIVE
Glucose, UA: NEGATIVE mg/dL
Hgb urine dipstick: NEGATIVE
Ketones, ur: NEGATIVE mg/dL
Leukocytes,Ua: NEGATIVE
Nitrite: NEGATIVE
Protein, ur: NEGATIVE mg/dL
Specific Gravity, Urine: 1.02 (ref 1.005–1.030)
pH: 6 (ref 5.0–8.0)

## 2021-09-19 LAB — COMPREHENSIVE METABOLIC PANEL
ALT: 27 U/L (ref 0–44)
AST: 24 U/L (ref 15–41)
Albumin: 3.8 g/dL (ref 3.5–5.0)
Alkaline Phosphatase: 62 U/L (ref 38–126)
Anion gap: 6 (ref 5–15)
BUN: 15 mg/dL (ref 6–20)
CO2: 26 mmol/L (ref 22–32)
Calcium: 9.8 mg/dL (ref 8.9–10.3)
Chloride: 106 mmol/L (ref 98–111)
Creatinine, Ser: 0.8 mg/dL (ref 0.44–1.00)
GFR, Estimated: >60 mL/min (ref >60)
Glucose, Bld: 109 mg/dL — ABNORMAL HIGH (ref 70–99)
Potassium: 3.6 mmol/L (ref 3.5–5.1)
Sodium: 138 mmol/L (ref 135–145)
Total Bilirubin: 0.5 mg/dL (ref 0.3–1.2)
Total Protein: 6.6 g/dL (ref 6.5–8.1)

## 2021-09-19 LAB — TROPONIN I (HIGH SENSITIVITY): Troponin I (High Sensitivity): 3 ng/L (ref <18)

## 2021-09-19 LAB — LIPASE, BLOOD: Lipase: 29 U/L (ref 11–51)

## 2021-09-19 MED ORDER — FAMOTIDINE 20 MG PO TABS
20.0000 mg | ORAL_TABLET | Freq: Once | ORAL | Status: AC
  Administered 2021-09-19: 09:00:00 20 mg via ORAL
  Filled 2021-09-19: qty 1

## 2021-09-19 MED ORDER — ALUM & MAG HYDROXIDE-SIMETH 200-200-20 MG/5ML PO SUSP
30.0000 mL | Freq: Once | ORAL | Status: AC
  Administered 2021-09-19: 09:00:00 30 mL via ORAL
  Filled 2021-09-19: qty 30

## 2021-09-19 MED ORDER — PANTOPRAZOLE SODIUM 20 MG PO TBEC: 20.0000 mg | DELAYED_RELEASE_TABLET | Freq: Two times a day (BID) | ORAL | 0 refills | Status: AC

## 2021-09-19 MED ORDER — LIDOCAINE VISCOUS HCL 2 % MT SOLN
15.0000 mL | Freq: Once | OROMUCOSAL | Status: AC
  Administered 2021-09-19: 09:00:00 15 mL via ORAL
  Filled 2021-09-19: qty 15

## 2021-09-19 MED ORDER — SUCRALFATE 1 G PO TABS
1.0000 g | ORAL_TABLET | Freq: Once | ORAL | Status: AC
  Administered 2021-09-19: 09:00:00 1 g via ORAL
  Filled 2021-09-19: qty 1

## 2021-09-19 NOTE — ED Triage Notes (Signed)
Pt BIB [facility] with reports of mid upper abdominal pain since last night. Pt states that the pain radiates upward and into her shoulder blades.

## 2021-09-19 NOTE — ED Notes (Signed)
Pt discharged. Instructions and prescription given. AAOX4. Pt in no apparent distress or pain. The opportunity to ask questions was provided.

## 2021-09-19 NOTE — ED Provider Notes (Signed)
[facility] DEPT Provider Note   CSN: [id number] Arrival date & time: 09/19/21  4098     History Chief Complaint  Patient presents with   Abdominal Pain    Lori Jensen is a 50 y.o. female.  50 year old female presents with epigastric pain radiating between her shoulder blades which began yesterday evening.  Symptoms have persisted but sometimes become worse.  Has used antacids without relief.  Some nausea but no vomiting.  No diaphoresis or dyspnea.  No prior history of same.  No history of dyspepsia.      Past Medical History:  Diagnosis Date   Anxiety    Depression    Dysmenorrhea    Fibromyalgia 11/2017   Heart murmur    Hypertension    PONV (postoperative nausea and vomiting)    Uterine fibroid     Patient Active Problem List   Diagnosis Date Noted   Vulval cellulitis 03/25/2018   Vaginal discharge 03/25/2018   Candidiasis of mouth 03/25/2018   Abscess of vulva 03/25/2018   Migraine 12/03/2017   Neck fullness 09/21/2017   Irregular menstrual bleeding 11/10/2016   Moderate episode of recurrent major depressive disorder ([place]) 11/06/2015   Obesity (BMI 30-39.9) 11/03/2015   Myofascial pain 11/03/2015   Essential hypertension 11/03/2015   SMOKER 07/23/2009   PALPITATIONS 06/12/2009   PANIC ATTACKS 11/24/2006   DEPRESSIVE DISORDER, NOS 11/24/2006   MITRAL VALVE PROLAPSE 11/24/2006   CHEST PAIN 11/24/2006    Past Surgical History:  Procedure Laterality Date   DILATION AND CURETTAGE OF UTERUS     DILATION AND CURETTAGE OF UTERUS  01/16/2013   Procedure: DILATATION AND CURETTAGE;  Surgeon: Gus Height, MD;  Location: [facility];  Service: Gynecology;;   ENDOMETRIAL ABLATION     uterine ablation  08/13/2011     OB History     Gravida  7   Para  2   Term  2   Preterm      AB  5   Living  2      SAB  3   IAB  2   Ectopic      Multiple      Live Births              Family History  Problem Relation Age  of Onset   Heart disease Mother    Anxiety disorder Mother    Depression Mother    Stomach cancer Father    Heart disease Father    Other Father        non hodgkins lymphoma   Alcohol abuse Maternal Aunt    Drug abuse Maternal Aunt    Alcohol abuse Brother    Alcohol abuse Maternal Uncle    Alcohol abuse Paternal Uncle    Colon cancer Neg Hx     Social History   Tobacco Use   Smoking status: Former    Years: 20.00    Types: Cigarettes, E-cigarettes    Quit date: 08/13/2016    Years since quitting: 5.1   Smokeless tobacco: Never   Tobacco comments:    Reports she does Vap occasionally   Vaping Use   Vaping Use: Every day   Start date: 03/27/2018   Substances: Nicotine, Flavoring   Devices: njoy  Substance Use Topics   Alcohol use: Not Currently    Comment: occasionally   Drug use: No    Home Medications Prior to Admission medications   Medication Sig Start Date End Date Taking?  Authorizing Provider  ARIPiprazole (ABILIFY) 5 MG tablet Take 1 tablet (5 mg total) by mouth daily. 09/03/21 09/03/22  [name], MD  buPROPion (WELLBUTRIN XL) 150 MG 24 hr tablet Take 3 tablets (450 mg total) by mouth every morning. 09/03/21 09/03/22  [name], MD  clonazePAM (KLONOPIN) 1 MG tablet Take 1 tablet (1 mg total) by mouth 2 (two) times daily as needed for anxiety. 09/03/21   [name], MD  cyclobenzaprine (FLEXERIL) 5 MG tablet Take by mouth. 02/01/18   [provider]  gabapentin (NEURONTIN) 400 MG capsule Take 1 capsule (400 mg total) by mouth 3 (three) times daily. 09/03/21 09/03/22  [name], MD  hydrochlorothiazide (HYDRODIURIL) 25 MG tablet Take 12.5 mg by mouth daily.  01/31/13   [provider]  lisinopril (PRINIVIL,ZESTRIL) 10 MG tablet Take 10 mg by mouth daily.    [provider]  magnesium gluconate (MAGONATE) 30 MG tablet Take 30 mg by mouth 2 (two) times daily.    [provider]  PROAIR HFA 108 [id number] Base) MCG/ACT inhaler   03/11/18   [provider]  traZODone (DESYREL) 150 MG tablet Take 1 tablet (150 mg total) by mouth at bedtime. 09/03/21   [name], MD    Allergies    Codeine, Amoxicillin, Doxycycline, and Lamictal [lamotrigine]  Review of Systems   Review of Systems  All other systems reviewed and are negative.  Physical Exam Updated Vital Signs BP (!) 140/99 (BP Location: Left Arm)    Pulse 72    Temp 98.2 F (36.8 C) (Oral)    Resp 18    SpO2 99%   Physical Exam Vitals and nursing note reviewed.  Constitutional:      General: She is not in acute distress.    Appearance: Normal appearance. She is well-developed. She is not toxic-appearing.  HENT:     Head: Normocephalic and atraumatic.  Eyes:     General: Lids are normal.     Conjunctiva/sclera: Conjunctivae normal.     Pupils: Pupils are equal, round, and reactive to light.  Neck:     Thyroid: No thyroid mass.     Trachea: No tracheal deviation.  Cardiovascular:     Rate and Rhythm: Normal rate and regular rhythm.     Heart sounds: Normal heart sounds. No murmur heard.   No gallop.  Pulmonary:     Effort: Pulmonary effort is normal. No respiratory distress.     Breath sounds: Normal breath sounds. No stridor. No decreased breath sounds, wheezing, rhonchi or rales.  Abdominal:     General: There is no distension.     Palpations: Abdomen is soft.     Tenderness: There is abdominal tenderness in the epigastric area. There is no rebound.    Musculoskeletal:        General: No tenderness. Normal range of motion.     Cervical back: Normal range of motion and neck supple.  Skin:    General: Skin is warm and dry.     Findings: No abrasion or rash.  Neurological:     Mental Status: She is alert and oriented to person, place, and time. Mental status is at baseline.     GCS: GCS eye subscore is 4. GCS verbal subscore is 5. GCS motor subscore is 6.     Cranial Nerves: No cranial nerve deficit.     Sensory: No sensory  deficit.     Motor: Motor function is intact.  Psychiatric:  Attention and Perception: Attention normal.        Speech: Speech normal.        Behavior: Behavior normal.    ED Results / Procedures / Treatments   Labs (all labs ordered are listed, but only abnormal results are displayed) Labs Reviewed  CBC WITH DIFFERENTIAL/PLATELET - Abnormal; Notable for the following components:      Result Value   RBC 5.47 (*)    Hemoglobin 15.4 (*)    HCT 47.1 (*)    All other components within normal limits  COMPREHENSIVE METABOLIC PANEL - Abnormal; Notable for the following components:   Glucose, Bld 109 (*)    All other components within normal limits  LIPASE, BLOOD  URINALYSIS, ROUTINE W REFLEX MICROSCOPIC  TROPONIN I (HIGH SENSITIVITY)    EKG EKG Interpretation  Date/Time:  Saturday September 19 2021 08:40:20 EST Ventricular Rate:  72 PR Interval:  153 QRS Duration: 94 QT Interval:  412 QTC Calculation: 451 R Axis:   10 Text Interpretation: Sinus rhythm Confirmed by Lacretia Leigh ([id number]) on 09/19/2021 8:50:55 AM  Radiology No results found.  Procedures Procedures   Medications Ordered in ED Medications  alum & mag hydroxide-simeth (MAALOX/MYLANTA) 200-200-20 MG/5ML suspension 30 mL (has no administration in time range)    And  lidocaine (XYLOCAINE) 2 % viscous mouth solution 15 mL (has no administration in time range)  sucralfate (CARAFATE) tablet 1 g (has no administration in time range)  famotidine (PEPCID) tablet 20 mg (has no administration in time range)    ED Course  I have reviewed the triage vital signs and the nursing notes.  Pertinent labs & imaging results that were available during my care of the patient were reviewed by me and considered in my medical decision making (see chart for details).    MDM Rules/Calculators/A&P                         Patient with negative chest x-ray and cardiac enzymes negative.  EKG per my interpretation without signs  of acute ischemic changes.  Patient with epigastric discomfort that is likely reflux in nature.  Concern for possible gallbladder pathology but labs were negative.  Concern for possible dissection but chest x-ray without widened mediastinum.  Good risk as a GI cocktail.  Will discharge home    Final Clinical Impression(s) / ED Diagnoses Final diagnoses:  Chest pain    Rx / DC Orders ED Discharge Orders     None        Lacretia Leigh, MD 09/19/21 [phone]

## 2021-09-19 NOTE — ED Notes (Signed)
Per Dr. Zenia Resides, the second Troponin I lab not needed. Pt can be discharged home.

## 2021-09-24 ENCOUNTER — Telehealth (HOSPITAL_COMMUNITY): Payer: Self-pay | Admitting: Psychiatry

## 2021-09-24 ENCOUNTER — Other Ambulatory Visit: Payer: Self-pay

## 2021-09-24 ENCOUNTER — Telehealth (HOSPITAL_COMMUNITY): Payer: Medicare (Managed Care) | Admitting: Psychiatry

## 2021-09-24 NOTE — Telephone Encounter (Signed)
I called the patient at our scheduled appointment time. There was no answer and it went straight to voicemail. I left a voice message for patient to call the clinic back at their convinence.

## 2021-10-01 ENCOUNTER — Other Ambulatory Visit: Payer: Self-pay

## 2021-10-01 ENCOUNTER — Telehealth (HOSPITAL_BASED_OUTPATIENT_CLINIC_OR_DEPARTMENT_OTHER): Payer: Medicare (Managed Care) | Admitting: Psychiatry

## 2021-10-01 DIAGNOSIS — F99 Mental disorder, not otherwise specified: Secondary | ICD-10-CM | POA: Diagnosis not present

## 2021-10-01 DIAGNOSIS — F4312 Post-traumatic stress disorder, chronic: Secondary | ICD-10-CM

## 2021-10-01 DIAGNOSIS — F3181 Bipolar II disorder: Secondary | ICD-10-CM | POA: Diagnosis not present

## 2021-10-01 DIAGNOSIS — F5105 Insomnia due to other mental disorder: Secondary | ICD-10-CM

## 2021-10-01 MED ORDER — CLONAZEPAM 1 MG PO TABS: 1.0000 mg | ORAL_TABLET | Freq: Two times a day (BID) | ORAL | 1 refills | Status: DC | PRN

## 2021-10-01 MED ORDER — ARIPIPRAZOLE 15 MG PO TABS: 7.5000 mg | ORAL_TABLET | Freq: Every day | ORAL | 0 refills | Status: DC

## 2021-10-01 NOTE — Progress Notes (Signed)
Virtual Visit via Video Note  I connected with Lori Jensen on 10/01/21 at  9:15 AM EST by a video enabled telemedicine application and verified that I am speaking with the correct person using two identifiers.  Location: Patient: home Provider: office   I discussed the limitations of evaluation and management by telemedicine and the availability of in person appointments. The patient expressed understanding and agreed to proceed.  History of Present Illness: Lori Jensen shares that the Abilify is helping. She is more motivated, productive and social. She has been able to get out of the bed. Her emotions are high. She is crying about everything. Lori Jensen is easily irritated and gets very angry. The angry feels out of proportion to the situation. Her depression feels better. Lori Jensen feels depressed 3-4 days/week and even on those days she is active. She is feeling very tired and will fall asleep sitting down. When she wakes up she is disoriented. She denies insomnia. Her appetite is good but she is working with a weight loss doctor. Her focus remains poor and she feels it started after getting COVID. She loses her train of thought easily and is losing her words. She gets tongue tied a lot. It is very frustrating and makes her feel bad.  Lori Jensen is have frequent negative self thoughts. She denies any manic or hypomanic like symptoms or episodes. She continues to experience PTSD symptoms of hyper- vigilance that is slightly better.    Observations/Objective: Psychiatric Specialty Exam: ROS  There were no vitals taken for this visit.There is no height or weight on file to calculate BMI.  General Appearance: Casual  Eye Contact:  Good  Speech:  Clear and Coherent and Normal Rate  Volume:  Normal  Mood:  Anxious and Depressed  Affect:  Congruent and brighter than prevoius visits  Thought Process:  Goal Directed, Linear, and Descriptions of Associations: Intact  Orientation:  Full (Time, Place, and  Person)  Thought Content:  Logical  Suicidal Thoughts:  No  Homicidal Thoughts:  No  Memory:  Immediate;   Good  Judgement:  Good  Insight:  Good  Psychomotor Activity:  Normal  Concentration:  Concentration: Good  Recall:  Good  Fund of Knowledge:  Good  Language:  Good  Akathisia:  No  Handed:  Right  AIMS (if indicated):     Assets:  Communication Skills Desire for Improvement Financial Resources/Insurance Housing Resilience Social Support Talents/Skills Transportation Vocational/Educational  ADL's:  Intact  Cognition:  WNL  Sleep:        Assessment and Plan: Depression screen [facility] 2/9 10/01/2021 09/03/2021 02/05/2021 12/18/2020 10/04/2017  Decreased Interest 1 3 3 3 3   Down, Depressed, Hopeless 1 3 2 3 3   PHQ - 2 Score 2 6 5 6 6   Altered sleeping 0 0 1 3 2   Tired, decreased energy 3 1 3 3 3   Change in appetite 0 0 1 0 3  Feeling bad or failure about yourself  2 3 3 3 3   Trouble concentrating 3 3 3 1 3   Moving slowly or fidgety/restless 0 1 0 0 1  Suicidal thoughts 0 0 0 0 2  PHQ-9 Score 10 14 16 16 23   Difficult doing work/chores Very difficult Extremely dIfficult Very difficult Very difficult -  Some recent data might be hidden    Flowsheet Row Video Visit from 10/01/2021 in [facility] from 09/19/2021 in [place] Video Visit from 09/03/2021 in [facility]  C-SSRS RISK CATEGORY No Risk No Risk No Risk      - she is working with a weight loss doctor because she has gained over 40 lbs in 1 yr. The doctor recommended Lori Jensen start on Topamax with Phentermine. I have no issues with a trial of Phentermine for weight loss. We discussed potential SE of irritability, anxiety and insomnia.   [name] is describing issues with memory and concentration and excessive fatigue. She is currently on Trazodone, Topamax, Gabapentin, Flexeril and Klonopin which are all  medications that can contribute to her reported symptoms. We discussed it and she would like to continue meds. We discussed cutting back or stopping Trazodone and Klonopin unless needed.   - increase Abilify to 7.5mg  po qD for depression  -continue Wellbutrin and Gabapentin  1. Bipolar 2 disorder (HCC) - ARIPiprazole (ABILIFY) 15 MG tablet; Take 0.5 tablets (7.5 mg total) by mouth daily.  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 Chronic post-traumatic stress disorder (PTSD)    Follow Up Instructions: In 1-2 months or sooner if needed   I discussed the assessment and treatment plan with the patient. The patient was provided an opportunity to ask questions and all were answered. The patient agreed with the plan and demonstrated an understanding of the instructions.   The patient was advised to call back or seek an in-person evaluation if the symptoms worsen or if the condition fails to improve as anticipated.  I provided 26 minutes of non-face-to-face time during this encounter.   Lori Cradle, MD

## 2021-11-19 ENCOUNTER — Other Ambulatory Visit: Payer: Self-pay

## 2021-11-19 ENCOUNTER — Telehealth (HOSPITAL_BASED_OUTPATIENT_CLINIC_OR_DEPARTMENT_OTHER): Payer: Medicare (Managed Care) | Admitting: Psychiatry

## 2021-11-19 DIAGNOSIS — F4312 Post-traumatic stress disorder, chronic: Secondary | ICD-10-CM

## 2021-11-19 DIAGNOSIS — F3181 Bipolar II disorder: Secondary | ICD-10-CM

## 2021-11-19 DIAGNOSIS — F5105 Insomnia due to other mental disorder: Secondary | ICD-10-CM

## 2021-11-19 DIAGNOSIS — F99 Mental disorder, not otherwise specified: Secondary | ICD-10-CM

## 2021-11-19 MED ORDER — BUPROPION HCL ER (XL) 150 MG PO TB24: 450.0000 mg | ORAL_TABLET | ORAL | 0 refills | Status: DC

## 2021-11-19 MED ORDER — GABAPENTIN 400 MG PO CAPS: 400.0000 mg | ORAL_CAPSULE | Freq: Every day | ORAL | 0 refills | Status: DC

## 2021-11-19 MED ORDER — ARIPIPRAZOLE 10 MG PO TABS: 10.0000 mg | ORAL_TABLET | Freq: Every day | ORAL | 0 refills | Status: DC

## 2021-11-19 MED ORDER — CLONAZEPAM 1 MG PO TABS: 1.0000 mg | ORAL_TABLET | Freq: Two times a day (BID) | ORAL | 0 refills | Status: DC | PRN

## 2021-11-19 NOTE — Progress Notes (Signed)
Virtual Visit via Video Note  I connected with Lori Jensen on 11/19/21 at  2:00 PM EST by a video enabled telemedicine application and verified that I am speaking with the correct person using two identifiers.  Location: Patient: home Provider: office   I discussed the limitations of evaluation and management by telemedicine and the availability of in person appointments. The patient expressed understanding and agreed to proceed.  History of Present Illness: Lori Jensen shares that she still feels "a little checked out". She is not focusing well on things. She gives the example of missing several turns when driving. Lori Jensen shares that she has a lot of things on her mind and has been stressed. Lori Jensen is dealing with her step dad who she does not get along with more than usual. She is going thru a break up right now. Lori Jensen is crying a lot and her depression is a little worse. The Abilify is helping. She doesn't go out much but has been social. She is not spending all her time in bed. Over the past 4-6 weeks her depression level is 6/10 (10 being the worst). She usually feels depressed about 3-4 days a week on average. She has also has on/off anxiety and irritability.  Lori Jensen is tired of people trying to take advantage of her. Her self worth is improving slowly. Her sleep is good most nights except for the last few nights due to stress. She usually gets 5-9 hrs/night. Lori Jensen has cut back to 1-2 nights/week of Trazodone. Her energy and appetite are fair. She hasn't been exercising much due to stress. She denies passive thoughts of death and SI/HI. She denies any manic or hypomanic like symptoms. A few times she has spent more money than she should have but it made her feel good. It is a common reaction to feeling down. She denies any other impulsive behaviors. Her anxiety is high due to dealing with her step father. Her hypervigilance is a little higher than usual. Lori Jensen continues to have intrusive memories  and has been talking about her trauma with her therapist and friend. She is taking 1 tab of Klonopin per day and it helps.    Observations/Objective: Psychiatric Specialty Exam: ROS  There were no vitals taken for this visit.There is no height or weight on file to calculate BMI.  General Appearance: Casual and Fairly Groomed  Eye Contact:  Good  Speech:  Clear and Coherent and Normal Rate  Volume:  Normal  Mood:  Anxious, Depressed, and Irritable  Affect:  Congruent  Thought Process:  Goal Directed, Linear, and Descriptions of Associations: Intact  Orientation:  Full (Time, Place, and Person)  Thought Content:  Logical  Suicidal Thoughts:  No  Homicidal Thoughts:  No  Memory:  Immediate;   Good  Judgement:  Good  Insight:  Good  Psychomotor Activity:  Normal  Concentration:  Concentration: Good  Recall:  Good  Fund of Knowledge:  Good  Language:  Good  Akathisia:  No  Handed:  Right  AIMS (if indicated):     Assets:  Communication Skills Desire for Improvement Financial Resources/Insurance Resilience Social Support Talents/Skills Transportation Vocational/Educational  ADL's:  Intact  Cognition:  WNL  Sleep:        Assessment and Plan: Depression screen [facility] 2/9 11/19/2021 10/01/2021 09/03/2021 02/05/2021 12/18/2020  Decreased Interest 1 1 3 3 3   Down, Depressed, Hopeless 2 1 3 2 3   PHQ - 2 Score 3 2 6 5 6   Altered sleeping 1 0  0 1 3  Tired, decreased energy 0 3 1 3 3   Change in appetite 0 0 0 1 0  Feeling bad or failure about yourself  1 2 3 3 3   Trouble concentrating 3 3 3 3 1   Moving slowly or fidgety/restless 0 0 1 0 0  Suicidal thoughts 0 0 0 0 0  PHQ-9 Score 8 10 14 16 16   Difficult doing work/chores Very difficult Very difficult Extremely dIfficult Very difficult Very difficult  Some recent data might be hidden    Flowsheet Row Video Visit from 11/19/2021 in [facility] Video Visit from 10/01/2021 in [facility] from 09/19/2021 in [facility]  C-SSRS RISK CATEGORY No Risk No Risk No Risk      - her memory and concentration remain poor. She has decreased the frequency of use of Trazodone to 1-2 days a week. Her dose of Topamax was increased and could greatly contribute to these issues. Other meds that can contribute are Flexaril, Gabapentin, Klonopin. Pt is aware that these can have above stated side effects. I am decreasing Gabapentin to 400mg  qHS, she was taking 400mg  BID.  - no refill on Trazodone as she has enough  - increase Abilify to 10mg  qD  1. Bipolar 2 disorder (HCC) - buPROPion (WELLBUTRIN XL) 150 MG 24 hr tablet; Take 3 tablets (450 mg total) by mouth every morning.  Dispense: 270 tablet; Refill: 0 - gabapentin (NEURONTIN) 400 MG capsule; Take 1 capsule (400 mg total) by mouth at bedtime.  Dispense: 30 capsule; Refill: 0 - ARIPiprazole (ABILIFY) 10 MG tablet; Take 1 tablet (10 mg total) by mouth daily.  Dispense: 90 tablet; Refill: 0  2. Chronic post-traumatic stress disorder (PTSD) - clonazePAM (KLONOPIN) 1 MG tablet; Take 1 tablet (1 mg total) by mouth 2 (two) times daily as needed for anxiety.  Dispense: 60 tablet; Refill: 0 - gabapentin (NEURONTIN) 400 MG capsule; Take 1 capsule (400 mg total) by mouth at bedtime.  Dispense: 30 capsule; Refill: 0  3. Insomnia due to other mental disorder    Follow Up Instructions: In 2-3 months or sooner if needed   I discussed the assessment and treatment plan with the patient. The patient was provided an opportunity to ask questions and all were answered. The patient agreed with the plan and demonstrated an understanding of the instructions.   The patient was advised to call back or seek an in-person evaluation if the symptoms worsen or if the condition fails to improve as anticipated.  I provided 24 minutes of non-face-to-face time during this encounter.   [name], MD

## 2022-01-01 ENCOUNTER — Other Ambulatory Visit (HOSPITAL_COMMUNITY): Payer: Self-pay | Admitting: Psychiatry

## 2022-01-01 DIAGNOSIS — F3181 Bipolar II disorder: Secondary | ICD-10-CM

## 2022-01-21 ENCOUNTER — Telehealth (HOSPITAL_BASED_OUTPATIENT_CLINIC_OR_DEPARTMENT_OTHER): Payer: Medicare (Managed Care) | Admitting: Psychiatry

## 2022-01-21 DIAGNOSIS — F5105 Insomnia due to other mental disorder: Secondary | ICD-10-CM | POA: Diagnosis not present

## 2022-01-21 DIAGNOSIS — F4312 Post-traumatic stress disorder, chronic: Secondary | ICD-10-CM | POA: Diagnosis not present

## 2022-01-21 DIAGNOSIS — F3181 Bipolar II disorder: Secondary | ICD-10-CM

## 2022-01-21 DIAGNOSIS — F99 Mental disorder, not otherwise specified: Secondary | ICD-10-CM | POA: Diagnosis not present

## 2022-01-21 MED ORDER — GABAPENTIN 400 MG PO CAPS: 400.0000 mg | ORAL_CAPSULE | Freq: Every day | ORAL | 3 refills | Status: DC

## 2022-01-21 MED ORDER — ARIPIPRAZOLE 10 MG PO TABS: 10.0000 mg | ORAL_TABLET | Freq: Every day | ORAL | 0 refills | Status: DC

## 2022-01-21 MED ORDER — BUPROPION HCL ER (XL) 150 MG PO TB24: 450.0000 mg | ORAL_TABLET | ORAL | 0 refills | Status: DC

## 2022-01-21 NOTE — Progress Notes (Signed)
Virtual Visit via Video Note ? ?I connected with Lori Jensen on 01/21/22 at  3:30 PM EDT by a video enabled telemedicine application and verified that I am speaking with the correct person using two identifiers. ? ?Location: ?Patient: home ?Provider: office ?  ?I discussed the limitations of evaluation and management by telemedicine and the availability of in person appointments. The patient expressed understanding and agreed to proceed. ? ?History of Present Illness: ?"I am doing ok I guess". "The depression has been not so bad". Lori Jensen feels like all of our previous sessions were around a stressful situation. Last week she was having chest pain on Thursday and woke up Friday feeling "manic". She was restless and did a ton of chores. She couldn't concentrate on anything. The situation made her anxious and her BP was 187/113. Her mood was irritable. The whole thing lasted about 2 hrs and then resolved. Lori Jensen is not sure what set her off and she was compliant with meds. She saw her PCP this Monday and her EKG was concerning so she was referred to a cardiologist. It is scheduled for 01/29/2022 Lately she doesn't feel depressed. Her energy is low and she sleeps most of the day for this past week. Her sleep is on/off. Some nights she is up all night and sleeps during the day and other days it is opposite. She is alone in the house and some of this has to do with feeling to scared to sleep. She has not been taking Trazodone very often (about 1x/week). She takes Klonopin once/day. Her PTSD has been "ok". Since our last visit she does not recall having any episodes.  ?  ?Observations/Objective: ?Psychiatric Specialty Exam: ?ROS  ?There were no vitals taken for this visit.There is no height or weight on file to calculate BMI.  ?General Appearance: Casual  ?Eye Contact:  Good  ?Speech:  Clear and Coherent and Normal Rate  ?Volume:  Normal  ?Mood:  Anxious  ?Affect:  Congruent  ?Thought Process:  Goal Directed, Linear,  and Descriptions of Associations: Intact  ?Orientation:  Full (Time, Place, and Person)  ?Thought Content:  Logical  ?Suicidal Thoughts:  No  ?Homicidal Thoughts:  No  ?Memory:  Immediate;   Good  ?Judgement:  Good  ?Insight:  Good  ?Psychomotor Activity:  Normal  ?Concentration:  Concentration: Good  ?Recall:  Good  ?Fund of Knowledge:  Good  ?Language:  Good  ?Akathisia:  No  ?Handed:  Right  ?AIMS (if indicated):     ?Assets:  Communication Skills ?Desire for Improvement ?Financial Resources/Insurance ?Housing ?Leisure Time ?Resilience ?Social Support ?Talents/Skills ?Transportation ?Vocational/Educational  ?ADL's:  Intact  ?Cognition:  WNL  ?Sleep:     ? ? ? ?Assessment and Plan: ? ? ?  11/19/2021  ?  2:08 PM 10/01/2021  ?  9:32 AM 09/03/2021  ?  8:52 AM 02/05/2021  ?  1:12 PM 12/18/2020  ? 10:57 AM  ?Depression screen PHQ 2/9  ?Decreased Interest '1 1 3 3 3  '$ ?Down, Depressed, Hopeless '2 1 3 2 3  '$ ?PHQ - 2 Score '3 2 6 5 6  '$ ?Altered sleeping 1 0 0 1 3  ?Tired, decreased energy 0 '3 1 3 3  '$ ?Change in appetite 0 0 0 1 0  ?Feeling bad or failure about yourself  '1 2 3 3 3  '$ ?Trouble concentrating '3 3 3 3 1  '$ ?Moving slowly or fidgety/restless 0 0 1 0 0  ?Suicidal thoughts 0 0 0 0 0  ?  PHQ-9 Score '8 10 14 16 16  '$ ?Difficult doing work/chores Very difficult Very difficult Extremely dIfficult Very difficult Very difficult  ? ? ?Flowsheet Row Video Visit from 11/19/2021 in [facility] Video Visit from 10/01/2021 in [facility] from 09/19/2021 in [facility]  ?C-SSRS RISK CATEGORY No Risk No Risk No Risk  ? ?  ? ? ? ?Status of current problems: stable ? ?Meds: ?Continue Klonopin and Trazodone - no refills today as pt states she has enough ?  ?1. Bipolar 2 disorder ([place]) ?- ARIPiprazole (ABILIFY) 10 MG tablet; Take 1 tablet (10 mg total) by mouth daily.  Dispense: 90 tablet; Refill: 0 ?- buPROPion (WELLBUTRIN XL) 150 MG 24  hr tablet; Take 3 tablets (450 mg total) by mouth every morning.  Dispense: 270 tablet; Refill: 0 ?- gabapentin (NEURONTIN) 400 MG capsule; Take 1 capsule (400 mg total) by mouth at bedtime.  Dispense: 90 capsule; Refill: 3 ? ?2. Chronic post-traumatic stress disorder (PTSD) ?- gabapentin (NEURONTIN) 400 MG capsule; Take 1 capsule (400 mg total) by mouth at bedtime.  Dispense: 90 capsule; Refill: 3 ? ?3. Insomnia due to other mental disorder ? ? ? ? ?Labs: EKG 09/19/21 shows QTc 451. Tamatha shares that she had an EKG done earlier this week and has an appointment with a cardiologist coming up soon.   ? ? ?Therapy: brief supportive therapy provided. Discussed psychosocial stressors in detail. ? ?Collaboration of Care: Other none today ? ?Patient/Guardian was advised Release of Information must be obtained prior to any record release in order to collaborate their care with an outside provider. Patient/Guardian was advised if they have not already done so to contact the registration department to sign all necessary forms in order for [place] to release information regarding their care.  ? ?Consent: Patient/Guardian gives verbal consent for treatment and assignment of benefits for services provided during this visit. Patient/Guardian expressed understanding and agreed to proceed.  ? ? ? ?Follow Up Instructions: ?Follow up in 2-3 months or sooner if needed ?  ? ?I discussed the assessment and treatment plan with the patient. The patient was provided an opportunity to ask questions and all were answered. The patient agreed with the plan and demonstrated an understanding of the instructions. ?  ?The patient was advised to call back or seek an in-person evaluation if the symptoms worsen or if the condition fails to improve as anticipated. ? ?I provided 15 minutes of non-face-to-face time during this encounter. ? ? ?[name], MD ? ?

## 2022-03-17 ENCOUNTER — Telehealth (HOSPITAL_COMMUNITY): Payer: Self-pay | Admitting: *Deleted

## 2022-03-17 ENCOUNTER — Telehealth (HOSPITAL_COMMUNITY): Payer: Self-pay | Admitting: Psychiatry

## 2022-03-17 ENCOUNTER — Other Ambulatory Visit (HOSPITAL_COMMUNITY): Payer: Medicare (Managed Care) | Attending: Psychiatry | Admitting: Psychiatry

## 2022-03-17 DIAGNOSIS — Z79899 Other long term (current) drug therapy: Secondary | ICD-10-CM | POA: Insufficient documentation

## 2022-03-17 DIAGNOSIS — F4312 Post-traumatic stress disorder, chronic: Secondary | ICD-10-CM | POA: Insufficient documentation

## 2022-03-17 DIAGNOSIS — Z5941 Food insecurity: Secondary | ICD-10-CM | POA: Insufficient documentation

## 2022-03-17 DIAGNOSIS — Z5986 Financial insecurity: Secondary | ICD-10-CM | POA: Insufficient documentation

## 2022-03-17 DIAGNOSIS — F410 Panic disorder [episodic paroxysmal anxiety] without agoraphobia: Secondary | ICD-10-CM | POA: Insufficient documentation

## 2022-03-17 DIAGNOSIS — F3181 Bipolar II disorder: Secondary | ICD-10-CM | POA: Insufficient documentation
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
## 2022-03-17 NOTE — Telephone Encounter (Signed)
Pt called stating that she has not been doing well and would like to enroll in either IOP or PHP. Pt was to check her insurance and then Probation officer sent referral to Dellia Nims for IOP.

## 2022-03-17 NOTE — Telephone Encounter (Signed)
D:  Pt phone inquiring about returning to [place].  A:  Re-oriented pt and updated her CCA.  Pt requesting to start on 03-23-22.  Pt will start virtual MH-IOP on 03-23-22 @ 9 a.m.  Inform Dr. Doyne Keel and Tammy Sours, LPN.  R:  Pt receptive.

## 2022-03-23 ENCOUNTER — Other Ambulatory Visit (HOSPITAL_BASED_OUTPATIENT_CLINIC_OR_DEPARTMENT_OTHER): Payer: Medicare (Managed Care) | Admitting: Family

## 2022-03-23 ENCOUNTER — Encounter (HOSPITAL_COMMUNITY): Payer: Self-pay | Admitting: Psychiatry

## 2022-03-23 DIAGNOSIS — F4312 Post-traumatic stress disorder, chronic: Secondary | ICD-10-CM | POA: Diagnosis not present

## 2022-03-23 DIAGNOSIS — F3181 Bipolar II disorder: Secondary | ICD-10-CM | POA: Diagnosis not present

## 2022-03-23 DIAGNOSIS — Z79899 Other long term (current) drug therapy: Secondary | ICD-10-CM | POA: Diagnosis not present

## 2022-03-23 DIAGNOSIS — Z5986 Financial insecurity: Secondary | ICD-10-CM | POA: Diagnosis not present

## 2022-03-23 DIAGNOSIS — Z5941 Food insecurity: Secondary | ICD-10-CM | POA: Diagnosis not present

## 2022-03-23 DIAGNOSIS — F410 Panic disorder [episodic paroxysmal anxiety] without agoraphobia: Secondary | ICD-10-CM | POA: Diagnosis present

## 2022-03-23 NOTE — Progress Notes (Signed)
Virtual Visit via Video Note   I connected with Nona [place]. [place] on 03/23/22 at  9:00 AM EDT by a video enabled telemedicine application and verified that I am speaking with the correct person using two identifiers.   At orientation to the IOP program, Case Manager discussed the limitations of evaluation and management by telemedicine and the availability of in person appointments. The patient expressed understanding and agreed to proceed with virtual visits throughout the duration of the program.   Location:  Patient: Patient Home Provider: OPT BH Office   History of Present Illness: Bipolar II Disorder and PTSD   Observations/Objective: Check In: Case Manager checked in with all participants to review discharge dates, insurance authorizations, work-related documents and needs from the treatment team regarding medications. Khloi stated needs and engaged in discussion.    Initial Therapeutic Activity: Counselor facilitated a check-in with Jeniah to assess for safety, sobriety and medication compliance.  Counselor also inquired about Neita's current emotional ratings, as well as any significant changes in thoughts, feelings or behavior since previous check in.  Meridel presented for session on time and was alert, oriented x5, with no evidence or self-report of active SI/HI or A/V H.  Sacha reported compliance with medication and denied use of alcohol or illicit substances.  Isamari reported scores of 3/10 for depression, 9/10 for anxiety, and 2/10 for anger/irritability.  Aneesia denied any recent outbursts.  Tehillah reported that a recent struggle was experiencing a panic attack yesterday, stating "A lot of that is coming from my daughter being in [place], and she is pregnant right now.  I don't know how I'm going to be able to fly and visit her".  Zayleigh reported that a recent success was going out with her sister to a doctor's appointment and tolerating anxiety well.  Amyracle reported that her goal  today is to [employer] about grief counseling and take a walk.    Second Therapeutic Activity: Counselor introduced [employer], [place] Chaplain to provide psychoeducation on topic of Grief and Loss with members today.  Marchelle Folks began discussion by checking in with the group about their baseline mood today, general thoughts on what grief means to them and how it has affected them personally in the past.  Marchelle Folks provided information on how the process of grief/loss can differ depending upon one's unique culture, and categories of loss one could experience (i.e. loss of a person, animal, relationship, job, identity, etc).  Marchelle Folks encouraged members to be mindful of how pervasive loss can be, and how to recognize signs which could indicate that this is having an impact on one's overall mental health and wellbeing.  Intervention was effective, as evidenced by Joni Reining participating in discussion with speaker on the subject, reporting that she is dreading the upcoming 4th of July holiday because it reminds her of her brother, who is deceased.  Whitnee reported that 4th of July was the last holiday the family was able to celebrate with him, so this time of year tends to affect her emotionally.  Aletse was receptive to feedback on how to deal with difficult holidays associated with grief in a healthy manner.    Third Therapeutic Activity: Counselor continued discussion upon topic of self-care today with group members.  Counselor virtually shared self-care assessment form with members via Webex to complete final sub-categories (i.e. psychological/emotional, social, spiritual, and professional).  Members were asked to rank their engagement in the activities listed for each dimension on a scale of 1-3, with 1  indicating 'Poor', 2 indicating 'Ok', and 3 indicating 'Well'.  Counselor invited members to share results of this assessment, and inquired about which areas of self-care they are doing well in, as well as  areas that require attention, and how they plan to begin addressing this during treatment.  Intervention was effective, as evidenced by Joni Reining successfully completing second section (psychological/emotional) of assessment and actively engaging in discussion on subject, reporting that she is excelling in areas such as taking time off from work, school and other obligations, but would benefit from focusing more on areas such as participating in hobbies, expressing feelings in healthy ways, and talking about her problems.  Tynesia reported that she would work to improve self-care deficits by exploring old hobbies when she feels motivated again, such as Public librarian, doing Museum/gallery curator, and writing in a journal or sharing about current concerns in group to ensure healthy emotional outlet.      Assessment and Plan: Counselor recommends that Euniqua remain in IOP treatment to better manage mental health symptoms, ensure stability and pursue completion of treatment plan goals. Counselor recommends adherence to crisis/safety plan, taking medications as prescribed, and following up with medical professionals if any issues arise.   Follow Up Instructions: Counselor will send Webex link for next session. Tahli was advised to call back or seek an in-person evaluation if the symptoms worsen or if the condition fails to improve as anticipated.   Collaboration of Care:   Medication Management AEB Hillery Jacks, NP                                           Case Manager AEB Jeri Modena, CNA    Patient/Guardian was advised Release of Information must be obtained prior to any record release in order to collaborate their care with an outside provider. Patient/Guardian was advised if they have not already done so to contact the registration department to sign all necessary forms in order for [place] to release information regarding their care.   Consent: Patient/Guardian gives verbal consent for treatment and  assignment of benefits for services provided during this visit. Patient/Guardian expressed understanding and agreed to proceed.  I provided 180 minutes of non-face-to-face time during this encounter.   Noralee Stain, LCSW, LCAS 03/23/22

## 2022-03-24 ENCOUNTER — Other Ambulatory Visit (HOSPITAL_COMMUNITY): Payer: Medicare (Managed Care) | Admitting: Licensed Clinical Social Worker

## 2022-03-24 DIAGNOSIS — F4312 Post-traumatic stress disorder, chronic: Secondary | ICD-10-CM

## 2022-03-24 DIAGNOSIS — F3181 Bipolar II disorder: Secondary | ICD-10-CM | POA: Diagnosis not present

## 2022-03-24 NOTE — Progress Notes (Signed)
Virtual Visit via Video Note   I connected with Lori Jensen. Lori Jensen on 03/24/22 at  9:00 AM EDT by a video enabled telemedicine application and verified that I am speaking with the correct person using two identifiers.   At orientation to the IOP program, Case Manager discussed the limitations of evaluation and management by telemedicine and the availability of in person appointments. The patient expressed understanding and agreed to proceed with virtual visits throughout the duration of the program.   Location:  Patient: Patient Home Provider: [facility] [place] [facility]   History of Present Illness: Bipolar II Disorder and PTSD   Observations/Objective: Check In: Case Manager checked in with all participants to review discharge dates, insurance authorizations, work-related documents and needs from the treatment team regarding medications. Lori Jensen stated needs and engaged in discussion.    Initial Therapeutic Activity: Counselor facilitated a check-in with Lori Jensen to assess for safety, sobriety and medication compliance.  Counselor also inquired about Lori Jensen's current emotional ratings, as well as any significant changes in thoughts, feelings or behavior since previous check in.  Lori Jensen presented for session on time and was alert, oriented x5, with no evidence or self-report of active SI/HI or A/V H.  Lori Jensen reported compliance with medication and denied use of alcohol or illicit substances.  Lori Jensen reported scores of 4/10 for depression, 9/10 for anxiety, and 2/10 for anger/irritability.  Lori Jensen denied any recent outbursts or panic attacks.  Lori Jensen reported that a recent success was being productive after group doing laundry and dishes.  Lori Jensen reported that a recent struggle was dealing with anxiety related to her daughter's upcoming pregnancy in August. Lori Jensen reported that her goal today is to find more tasks to do around the home to preoccupy herself and avoid rumination.       Second Therapeutic Activity:  Counselor introduced Lori Jensen, [employer] Pharmacist, to provide psychoeducation on topic of medication compliance with members today.  Lori Jensen provided psychoeducation on classes of medications such as antidepressants, antipsychotics, what symptoms they are intended to treat, and any side effects one might encounter while on a particular prescription.  Time was allowed for clients to ask any questions they might have of [facility] regarding this specialty.  Intervention was effective, as evidenced by Lori Jensen participating in discussion with speaker on the subject, reporting that she suffers frequently from racing thoughts, and has been interested in something that could help reducing the occurrence of these throughout the day.  Lori Jensen was receptive to feedback from pharmacist on medication options which might prove beneficial to discuss with psychiatrist at next follow up appointment.    Assessment and Plan: Counselor recommends that Lori Jensen remain in IOP treatment to better manage mental health symptoms, ensure stability and pursue completion of treatment plan goals. Counselor recommends adherence to crisis/safety plan, taking medications as prescribed, and following up with medical professionals if any issues arise.   Follow Up Instructions: Counselor will send Webex link for next session. Lori Jensen was advised to call back or seek an in-person evaluation if the symptoms worsen or if the condition fails to improve as anticipated.   Collaboration of Care:   Medication Management AEB Lori Ala, NP                                           Case Manager AEB Lori Nims, CNA    Patient/Guardian was advised Release of Information must be  obtained prior to any record release in order to collaborate their care with an outside provider. Patient/Guardian was advised if they have not already done so to contact the registration department to sign all necessary forms in order for [place] to release information regarding their care.    Consent: Patient/Guardian gives verbal consent for treatment and assignment of benefits for services provided during this visit. Patient/Guardian expressed understanding and agreed to proceed.  I provided 180 minutes of non-face-to-face time during this encounter.   Shade Flood, LCSW, LCAS 03/24/22

## 2022-03-25 ENCOUNTER — Telehealth (HOSPITAL_COMMUNITY): Payer: Self-pay | Admitting: Psychiatry

## 2022-03-25 ENCOUNTER — Ambulatory Visit (HOSPITAL_COMMUNITY): Payer: Medicare (Managed Care)

## 2022-03-25 ENCOUNTER — Telehealth (HOSPITAL_COMMUNITY): Payer: Medicare (Managed Care) | Admitting: Psychiatry

## 2022-03-26 ENCOUNTER — Other Ambulatory Visit (HOSPITAL_COMMUNITY): Payer: Medicare (Managed Care) | Admitting: Licensed Clinical Social Worker

## 2022-03-26 DIAGNOSIS — F4312 Post-traumatic stress disorder, chronic: Secondary | ICD-10-CM

## 2022-03-26 DIAGNOSIS — F3181 Bipolar II disorder: Secondary | ICD-10-CM | POA: Diagnosis not present

## 2022-03-26 NOTE — Progress Notes (Signed)
Virtual Visit via Video Note  I connected with Lori Jensen on 03/26/22 at  9:00 AM EDT by a video enabled telemedicine application and verified that I am speaking with the correct person using two identifiers.  Location: Patient: Home Provider: Office   I discussed the limitations of evaluation and management by telemedicine and the availability of in person appointments. The patient expressed understanding and agreed to proceed.    I discussed the assessment and treatment plan with the patient. The patient was provided an opportunity to ask questions and all were answered. The patient agreed with the plan and demonstrated an understanding of the instructions.   The patient was advised to call back or seek an in-person evaluation if the symptoms worsen or if the condition fails to improve as anticipated.  I provided 15  minutes of non-face-to-face time during this encounter.   Derrill Center, NP    Psychiatric Initial Adult Assessment   Patient Identification: Lori Jensen MRN:  [id number] Date of Evaluation:  03/26/2022 Referral Source:  MD [name]  Chief Complaint:   Chief Complaint  Patient presents with   Panic Attack   Depression   Visit Diagnosis:    ICD-10-CM   1. Bipolar 2 disorder (HCC)  F31.81     2. Chronic post-traumatic stress disorder (PTSD)  F43.12       History of Present Illness: Lori Jensen is a 51 year old Caucasian female who presents due to reported increased depression and anxiety.  States she has been having frequent panic attacks ongoing symptoms of worry.  She reports a history with depression, anxiety, posttraumatic stress disorder and panic disorder.  She reports she has been taking her medications as indicated.  Currently followed by therapy and psychiatry. MD Doyne Keel for medication management and therapist Vernelle Emerald at the [facility].  Aaliyan denied illicit drug use or substance abuse history.  Reported previously  attending partial hospitalization programming and inpatient admissions.  Reports more recently she has been catastrophizing everything. "  I am having her new grandbaby and I am worried about everything from the plane ride to the new car seat."  Reports her daughter resides in [place] and she continues to worry about " every little thing."  Reports her symptoms started to flareup roughly 2 weeks ago.  Patient to start intensive outpatient programming 03/23/2022  Associated Signs/Symptoms: Depression Symptoms:  depressed mood, difficulty concentrating, anxiety, (Hypo) Manic Symptoms:  Impulsivity, Anxiety Symptoms:  Excessive Worry, Psychotic Symptoms:  Hallucinations: None PTSD Symptoms: NA  Past Psychiatric History:   Previous Psychotropic Medications: Yes   Substance Abuse History in the last 12 months:  No.  Consequences of Substance Abuse: NA  Past Medical History:  Past Medical History:  Diagnosis Date   Anxiety    Depression    Dysmenorrhea    Fibromyalgia 11/2017   Heart murmur    Hypertension    PONV (postoperative nausea and vomiting)    Uterine fibroid     Past Surgical History:  Procedure Laterality Date   DILATION AND CURETTAGE OF UTERUS     DILATION AND CURETTAGE OF UTERUS  01/16/2013   Procedure: DILATATION AND CURETTAGE;  Surgeon: Gus Height, MD;  Location: [facility];  Service: Gynecology;;   ENDOMETRIAL ABLATION     uterine ablation  08/13/2011    Family Psychiatric History:   Family History:  Family History  Problem Relation Age of Onset   Heart disease Mother    Anxiety disorder Mother  Depression Mother    Stomach cancer Father    Heart disease Father    Other Father        non hodgkins lymphoma   Alcohol abuse Maternal Aunt    Drug abuse Maternal Aunt    Alcohol abuse Brother    Alcohol abuse Maternal Uncle    Alcohol abuse Paternal Uncle    Colon cancer Neg Hx     Social History:   Social History   Socioeconomic History   Marital  status: Divorced    Spouse name: Not on file   Number of children: 2   Years of education: Not on file   Highest education level: GED or equivalent  Occupational History   Occupation: MORTGAGE Therapist, art: [employer]  Tobacco Use   Smoking status: Former    Years: 20.00    Types: Cigarettes, E-cigarettes    Quit date: 08/13/2016    Years since quitting: 5.6   Smokeless tobacco: Never   Tobacco comments:    Reports she does Vap occasionally   Vaping Use   Vaping Use: Every day   Start date: 03/27/2018   Substances: Nicotine, Flavoring   Devices: njoy  Substance and Sexual Activity   Alcohol use: Not Currently    Comment: occasionally   Drug use: No   Sexual activity: Yes    Partners: Male    Birth control/protection: None, Surgical  Other Topics Concern   Not on file  Social History Narrative   Reports at times has been emotionally mistreated by partner but denies any other issues.    Social Determinants of Health   Financial Resource Strain: Medium Risk (09/09/2017)   Overall Financial Resource Strain (CARDIA)    Difficulty of Paying Living Expenses: Somewhat hard  Food Insecurity: Food Insecurity Present (09/09/2017)   Hunger Vital Sign    Worried About Running Out of Food in the Last Year: Sometimes true    Ran Out of Food in the Last Year: Sometimes true  Transportation Needs: No Transportation Needs (09/09/2017)   PRAPARE - Hydrologist (Medical): No    Lack of Transportation (Non-Medical): No  Physical Activity: Unknown (09/09/2017)   Exercise Vital Sign    Days of Exercise per Week: 0 days    Minutes of Exercise per Session: Not on file  Stress: Stress Concern Present (09/09/2017)   [place]    Feeling of Stress : Very much  Social Connections: Socially Isolated (09/09/2017)   Social Connection and Isolation Panel [NHANES]    Frequency of  Communication with Friends and Family: Once a week    Frequency of Social Gatherings with Friends and Family: Never    Attends Religious Services: Never    Marine scientist or Organizations: No    Attends Archivist Meetings: Never    Marital Status: Divorced    Additional Social History:   Allergies:   Allergies  Allergen Reactions   Codeine Anaphylaxis   Amoxicillin Rash    Has patient had a PCN reaction causing immediate rash, facial/tongue/throat swelling, SOB or lightheadedness with hypotension: no Has patient had a PCN reaction causing severe rash involving mucus membranes or skin necrosis: no Has patient had a PCN reaction that required hospitalization no Has patient had a PCN reaction occurring within the last 10 years: yes If all of the above answers are "NO", then may proceed with Cephalosporin use.  Doxycycline Rash   Lamictal [Lamotrigine] Rash    Metabolic Disorder Labs: No results found for: "HGBA1C", "MPG" No results found for: "PROLACTIN" No results found for: "CHOL", "TRIG", "HDL", "CHOLHDL", "VLDL", "LDLCALC" No results found for: "TSH"  Therapeutic Level Labs: No results found for: "LITHIUM" No results found for: "CBMZ" No results found for: "VALPROATE"  Current Medications: Current Outpatient Medications  Medication Sig Dispense Refill   ARIPiprazole (ABILIFY) 10 MG tablet Take 1 tablet (10 mg total) by mouth daily. 90 tablet 0   buPROPion (WELLBUTRIN XL) 150 MG 24 hr tablet Take 3 tablets (450 mg total) by mouth every morning. 270 tablet 0   clonazePAM (KLONOPIN) 1 MG tablet Take 1 tablet (1 mg total) by mouth 2 (two) times daily as needed for anxiety. 60 tablet 0   cyclobenzaprine (FLEXERIL) 5 MG tablet Take 5 mg by mouth 2 (two) times daily.     gabapentin (NEURONTIN) 400 MG capsule Take 1 capsule (400 mg total) by mouth at bedtime. 90 capsule 3   hydrochlorothiazide (HYDRODIURIL) 25 MG tablet Take 12.5 mg by mouth daily.       lisinopril (PRINIVIL,ZESTRIL) 10 MG tablet Take 10 mg by mouth daily.     pantoprazole (PROTONIX) 20 MG tablet Take 1 tablet (20 mg total) by mouth 2 (two) times daily. 30 tablet 0   PROAIR HFA 108 (90 Base) MCG/ACT inhaler Inhale 1-2 puffs into the lungs every 6 (six) hours as needed for wheezing or shortness of breath.  2   saccharomyces boulardii (FLORASTOR) 250 MG capsule Take 250 mg by mouth 2 (two) times daily.     topiramate (TOPAMAX) 25 MG tablet Take 50 mg by mouth daily.     traZODone (DESYREL) 150 MG tablet Take 1 tablet (150 mg total) by mouth at bedtime. 90 tablet 0   No current facility-administered medications for this visit.    Musculoskeletal: Strength & Muscle Tone: within normal limits Gait & Station: normal Patient leans: N/A  Psychiatric Specialty Exam: Review of Systems  All other systems reviewed and are negative.   There were no vitals taken for this visit.There is no height or weight on file to calculate BMI.  General Appearance: Casual  Eye Contact:  Good  Speech:  Clear and Coherent  Volume:  Normal  Mood:  Anxious and Depressed  Affect:  Congruent  Thought Process:  Coherent  Orientation:  Full (Time, Place, and Person)  Thought Content:  Logical  Suicidal Thoughts:  No  Homicidal Thoughts:  No  Memory:  Immediate;   Fair Recent;   Fair  Judgement:  Fair  Insight:  Fair  Psychomotor Activity:  Normal  Concentration:  Concentration: Good  Recall:  Good  Fund of Knowledge:Good  Language: Fair  Akathisia:  No  Handed:  Right  AIMS (if indicated):  done  Assets:  Communication Skills Desire for Improvement Resilience Social Support  ADL's:  Intact  Cognition: WNL  Sleep:  Good   Screenings: GAD-7    Flowsheet Row Counselor from 10/04/2017 in [place] Counselor from 09/08/2017 in [place] Counselor from 03/04/2017 in [place] Counselor from 02/22/2017 in [place]  Total GAD-7 Score '17 19 12 20      '$ PHQ2-9    Flowsheet Row Counselor from 03/17/2022 in [place] Video Visit from 11/19/2021 in [facility] Video Visit from 10/01/2021 in [facility]  Video Visit from 09/03/2021 in [facility] Video Visit from 02/05/2021 in [facility]  PHQ-2 Total Score '4 3 2 6 5  '$ PHQ-9 Total Score '22 8 10 14 16      '$ Flowsheet Row Counselor from 03/17/2022 in [place] Video Visit from 11/19/2021 in [facility] Video Visit from 10/01/2021 in [place] Error: Question 6 not populated No Risk No Risk       Assessment and Plan:  Patient to start intensive outpatient programming -Continue medications as indicated   Collaboration of Care: Medication Management AEB continue Wellbutrin Abilify and gabapentin follow-up and Primary Care Provider AEB Doyne Keel  Patient/Guardian was advised Release of Information must be obtained prior to any record release in order to collaborate their care with an outside provider. Patient/Guardian was advised if they have not already done so to contact the registration department to sign all necessary forms in order for [place] to release information regarding their care.   Consent: Patient/Guardian gives verbal consent for treatment and assignment of benefits for services provided during this visit. Patient/Guardian expressed understanding and agreed to proceed.   Derrill Center, NP 6/30/202312:25 PM

## 2022-03-26 NOTE — Progress Notes (Signed)
Virtual Visit via Video Note   I connected with Ferd Hibbs. Fantroy on 03/26/22 at  9:00 AM EDT by a video enabled telemedicine application and verified that I am speaking with the correct person using two identifiers.   At orientation to the IOP program, Case Manager discussed the limitations of evaluation and management by telemedicine and the availability of in person appointments. The patient expressed understanding and agreed to proceed with virtual visits throughout the duration of the program.   Location:  Patient: Patient Home Provider: Counselor Home Office   History of Present Illness: Bipolar II Disorder and PTSD   Observations/Objective: Check In: Case Manager checked in with all participants to review discharge dates, insurance authorizations, work-related documents and needs from the treatment team regarding medications. Kirandeep stated needs and engaged in discussion.    Initial Therapeutic Activity: Counselor facilitated a check-in with Sylvester to assess for safety, sobriety and medication compliance.  Counselor also inquired about Shaniqwa's current emotional ratings, as well as any significant changes in thoughts, feelings or behavior since previous check in.  Swan presented for session on time and was alert, oriented x5, with no evidence or self-report of active SI/HI or A/V H.  Jacques reported compliance with medication and denied use of alcohol or illicit substances.  Latrece reported scores of 6/10 for depression, 6/10 for anxiety, and 2/10 for anger/irritability.  Nayeli denied any recent outbursts or panic attacks.  Sabrinia denied any successes at this time, stating "Wilburn Mylar was pretty lousy".  Rupal reported that she believes poor sleep and fatigue has affected overall motivation, but she plans to speak with her doctor about this.  Janyah reported that her goal today meet with someone about paperwork for her car, and do some laundry and dishes to stay productive.    Second  Therapeutic Activity: Counselor invited members to participate in peaceful place guided imagery activity today.  Counselor explained how this is a powerful visualization tool which can aid in reducing stress while increasing sense of calm, control, and awareness if practiced regularly.  Counselor informed members beforehand that if they became uncomfortable at any point during activity, they could stop and open their eyes.  Counselor invited members to get comfortable, achieve a relaxing breathing rhythm, close their eyes, and then guided them through process of creating a 'peaceful place' which filled them with safety and calm.  Counselor encouraged members to include sensory details involving vision, sound, touch, smell, and taste which they considered pleasant to enhance experience.  After 10 minutes of practice in session, counselor invited members to share their opinion on the activity, including whether they were able to imagine a specific place, what details stood out to them, and how this made them feel during and after.  Intervention was effective, as evidenced by [name] successfully participating in activity, and reporting that she initially found the activity relaxing, but it brought up a memory of someone that had passed away, so she focused instead on grounding herself by using deep breathing, which was calming.  Exilda was receptive to encouragement from counselor on importance of linking with a grief counselor or agency that provides such services (i.e. Manufacturing engineer) before [place] discharge date in order to address this need.   Assessment and Plan: Counselor recommends that Kwanza remain in IOP treatment to better manage mental health symptoms, ensure stability and pursue completion of treatment plan goals. Counselor recommends adherence to crisis/safety plan, taking medications as prescribed, and following up with medical professionals if any issues  arise.   Follow Up  Instructions: Counselor will send Webex link for next session. Alila was advised to call back or seek an in-person evaluation if the symptoms worsen or if the condition fails to improve as anticipated.   Collaboration of Care:   Medication Management AEB Ricky Ala, NP                                           Case Manager AEB Dellia Nims, CNA    Patient/Guardian was advised Release of Information must be obtained prior to any record release in order to collaborate their care with an outside provider. Patient/Guardian was advised if they have not already done so to contact the registration department to sign all necessary forms in order for [place] to release information regarding their care.   Consent: Patient/Guardian gives verbal consent for treatment and assignment of benefits for services provided during this visit. Patient/Guardian expressed understanding and agreed to proceed.  I provided 180 minutes of non-face-to-face time during this encounter.   Shade Flood, LCSW, LCAS 03/26/22

## 2022-03-26 NOTE — Addendum Note (Signed)
Addended by: Derrill Center on: 03/26/2022 01:53 PM   Modules accepted: Level of Service

## 2022-03-29 ENCOUNTER — Other Ambulatory Visit (HOSPITAL_COMMUNITY): Payer: Medicare (Managed Care) | Attending: Family | Admitting: Licensed Clinical Social Worker

## 2022-03-29 DIAGNOSIS — F410 Panic disorder [episodic paroxysmal anxiety] without agoraphobia: Secondary | ICD-10-CM | POA: Diagnosis present

## 2022-03-29 DIAGNOSIS — F4312 Post-traumatic stress disorder, chronic: Secondary | ICD-10-CM | POA: Diagnosis not present

## 2022-03-29 DIAGNOSIS — F3181 Bipolar II disorder: Secondary | ICD-10-CM | POA: Insufficient documentation

## 2022-03-29 NOTE — Progress Notes (Signed)
Virtual Visit via Video Note   I connected with Lori Jensen on 03/29/22 at  9:00 AM EDT by a video enabled telemedicine application and verified that I am speaking with the correct person using two identifiers.   At orientation to the IOP program, Case Manager discussed the limitations of evaluation and management by telemedicine and the availability of in person appointments. The patient expressed understanding and agreed to proceed with virtual visits throughout the duration of the program.   Location:  Patient: Patient Home Provider: [facility]   History of Present Illness: Bipolar II Disorder and PTSD   Observations/Objective: Check In: Case Manager checked in with all participants to review discharge dates, insurance authorizations, work-related documents and needs from the treatment team regarding medications. Lori Jensen stated needs and engaged in discussion.    Initial Therapeutic Activity: Counselor facilitated a check-in with Lori Jensen to assess for safety, sobriety and medication compliance.  Counselor also inquired about Lori Jensen's current emotional ratings, as well as any significant changes in thoughts, feelings or behavior since previous check in.  Lori Jensen presented for session on time and was alert, oriented x5, with no evidence or self-report of active SI/HI or A/V H.  Lori Jensen reported compliance with medication and denied use of alcohol or illicit substances.  Lori Jensen reported scores of 5/10 for depression, 5/10 for anxiety, and 3/10 for irritability.  Lori Jensen denied any recent outbursts or panic attacks.  Lori Jensen reported that a recent success was spending quality time with her son yesterday.  Lori Jensen reported that she doesn't see or hear from her son very often, so this was also an emotional meeting, stating "He is in a toxic relationship and doesn't see it".  Lori Jensen was receptive to feedback from the group on how to assist her son while respecting boundaries he puts in place  within the family.  Lori Jensen reported that her goal today is to find something to do that will get her out of the house, such as running errands.    Second Therapeutic Activity: Counselor introduced topic of building social support network today.  Counselor explained how this can be defined as having a having a group of healthy people in one's life you can talk to, spend time with, and get help from to improve both mental and physical health.  Counselor noted that some barriers can make it difficult to connect with other people, including the presence of anxiety or depression, or moving to an unfamiliar area.  Group members were asked to assess the current state of their support network, and identify ways that this could be improved.  Tips were given on how to address previously noted barriers, such as strengthening social skills, using relaxation techniques to reduce anxiety, scheduling social time each week, and/or exploring social events nearby which could increase chances of meeting new supports.  Members were also encouraged to consider getting closer to people they already know through suggestions such as outreaching someone by text, email or phone call if they haven't spoken in awhile, doing something nice for a friend/family member unexpectedly, and/or inviting someone over for a game/movie/dinner night.  Intervention was effective, as evidenced by Lori Jensen actively participating in discussion on the subject, and reporting that she would benefit from improving her support network, as it is presently lacking.  Lori Jensen reported that she has one best friend that understands her mental health challenges and offers space to listen and provider feedback, but she worries about being a burden.  Lori Jensen stated "I'm kind of  a shut in, and don't get out very much.  It wasn't always like that though".  Lori Jensen was receptive to several ideas offered for enhancing support network as this time, including practicing relaxation  skills learned from therapy to comfortably get out of the house, connecting with [employer] to interact with peers that also struggle with mental health, join a gym to meet other active people while improving self-image, volunteering with an organization like [employer], joining a class at [employer], and finding a new home church or [name] reported that she would also improve social skills by addressing traits of her unapproachable nonverbal communication stye, since this could pose a barrier.    Assessment and Plan: Counselor recommends that Lori Jensen remain in IOP treatment to better manage mental health symptoms, ensure stability and pursue completion of treatment plan goals. Counselor recommends adherence to crisis/safety plan, taking medications as prescribed, and following up with medical professionals if any issues arise.   Follow Up Instructions: Counselor will send Webex link for next session. Lori Jensen was advised to call back or seek an in-person evaluation if the symptoms worsen or if the condition fails to improve as anticipated.   Collaboration of Care:   Medication Management AEB Lori Ala, NP                                           Case Manager AEB Lori Nims, CNA    Patient/Guardian was advised Release of Information must be obtained prior to any record release in order to collaborate their care with an outside provider. Patient/Guardian was advised if they have not already done so to contact the registration department to sign all necessary forms in order for [place] to release information regarding their care.   Consent: Patient/Guardian gives verbal consent for treatment and assignment of benefits for services provided during this visit. Patient/Guardian expressed understanding and agreed to proceed.  I provided 180 minutes of non-face-to-face time during this encounter.   Shade Flood, LCSW, LCAS 03/29/22

## 2022-03-31 ENCOUNTER — Ambulatory Visit (HOSPITAL_COMMUNITY): Payer: Medicare (Managed Care) | Admitting: Psychiatry

## 2022-03-31 ENCOUNTER — Telehealth (HOSPITAL_COMMUNITY): Payer: Self-pay | Admitting: Psychiatry

## 2022-04-01 ENCOUNTER — Other Ambulatory Visit (HOSPITAL_COMMUNITY): Payer: Medicare (Managed Care) | Admitting: Licensed Clinical Social Worker

## 2022-04-01 DIAGNOSIS — F3181 Bipolar II disorder: Secondary | ICD-10-CM

## 2022-04-01 DIAGNOSIS — F4312 Post-traumatic stress disorder, chronic: Secondary | ICD-10-CM | POA: Diagnosis not present

## 2022-04-01 NOTE — Progress Notes (Signed)
Virtual Visit via Video Note   I connected with Ferd Hibbs. Resler on 04/01/22 at  9:00 AM EDT by a video enabled telemedicine application and verified that I am speaking with the correct person using two identifiers.   At orientation to the IOP program, Case Manager discussed the limitations of evaluation and management by telemedicine and the availability of in person appointments. The patient expressed understanding and agreed to proceed with virtual visits throughout the duration of the program.   Location:  Patient: Patient Home Provider: [facility] [place] [facility]   History of Present Illness: Bipolar II Disorder and PTSD   Observations/Objective: Check In: Case Manager checked in with all participants to review discharge dates, insurance authorizations, work-related documents and needs from the treatment team regarding medications. Sahej stated needs and engaged in discussion.    Initial Therapeutic Activity: Counselor facilitated a check-in with Dimitra to assess for safety, sobriety and medication compliance.  Counselor also inquired about Natarsha's current emotional ratings, as well as any significant changes in thoughts, feelings or behavior since previous check in.  Lorri presented for session on time and was alert, oriented x5, with no evidence or self-report of active SI/HI or A/V H.  Edilia reported compliance with medication and denied use of alcohol or illicit substances.  Kevia reported scores of 4/10 for depression, 6/10 for anxiety, and 2/10 for anger/irritability.  Julianah denied any recent outbursts or panic attacks.  Cloma reported that a recent struggle was experiencing trouble with her [facility] unit yesterday, which prevented her from engaging in group since she expected people to be coming in and out of the home.  Angela reported that a success was getting everything fixed later in the evening.  Shalea reported that her goal today is to get out of the house and run some errands.     Second  Therapeutic Activity: Counselor introduced topic of anger management today.  Counselor virtually shared a handout with members on this subject featuring a variety of coping skills, and facilitated discussion on these approaches.  Examples included raising awareness of anger triggers, practicing deep breathing, keeping an anger log to better understand episodes, using diversion activities to distract oneself for 30 minutes, taking a time out when necessary, and being mindful of warning signs tied to thoughts or behavior.  Counselor inquired about which techniques group members have used before, what has proved to be helpful, what their unique warning signs might be, as well as what they will try out in the future to assist with de-escalation.  Intervention was effective, as evidenced by [name] participating in discussion on activity, and reporting that she has struggled with managing anger at times, including when stress at work was high, or when unexpected challenges arise, such as her [facility] unit failing the other day.  Talina reported that growing up, she witnessed a lot of anger and fighting between her parents, and stated "Now I don't handle anger very well in relationships, so when I see that I am ready to split".  Elfrieda reported that her triggers include being treated unfairly, failing at something, being insulted by someone else, being lied to, being around someone rude or inconsiderate, financial problems, or experiencing prejudice.  Karyss reported that warning signs include high blood pressure, decreased self-esteem, nausea, muscle tension, increased body temperature, depression, frequent outbursts at work, shakiness, and red face.  Mckenzey reported that she will work to manage anger more effectively by using coping skills such as documenting stressful events in an anger journal,  talking to a supportive friend about stressors for feedback and guidance, meditating, practicing deep breathing, finding things to  laugh about, writing a letter expressing her emotions to someone, or going to the gym as an outlet for stress.  Assessment and Plan: Counselor recommends that Angeline remain in IOP treatment to better manage mental health symptoms, ensure stability and pursue completion of treatment plan goals. Counselor recommends adherence to crisis/safety plan, taking medications as prescribed, and following up with medical professionals if any issues arise.   Follow Up Instructions: Counselor will send Webex link for next session. Tiki was advised to call back or seek an in-person evaluation if the symptoms worsen or if the condition fails to improve as anticipated.   Collaboration of Care:   Medication Management AEB Ricky Ala, NP                                           Case Manager AEB Dellia Nims, CNA    Patient/Guardian was advised Release of Information must be obtained prior to any record release in order to collaborate their care with an outside provider. Patient/Guardian was advised if they have not already done so to contact the registration department to sign all necessary forms in order for [place] to release information regarding their care.   Consent: Patient/Guardian gives verbal consent for treatment and assignment of benefits for services provided during this visit. Patient/Guardian expressed understanding and agreed to proceed.  I provided 180 minutes of non-face-to-face time during this encounter.   Shade Flood, LCSW, LCAS 04/01/22

## 2022-04-02 ENCOUNTER — Other Ambulatory Visit (HOSPITAL_COMMUNITY): Payer: Medicare (Managed Care) | Admitting: Licensed Clinical Social Worker

## 2022-04-02 DIAGNOSIS — F4312 Post-traumatic stress disorder, chronic: Secondary | ICD-10-CM

## 2022-04-02 DIAGNOSIS — F3181 Bipolar II disorder: Secondary | ICD-10-CM

## 2022-04-02 NOTE — Progress Notes (Signed)
Virtual Visit via Video Note   I connected with Lori Jensen. Van on 04/02/22 at  9:00 AM EDT by a video enabled telemedicine application and verified that I am speaking with the correct person using two identifiers.   At orientation to the IOP program, Case Manager discussed the limitations of evaluation and management by telemedicine and the availability of in person appointments. The patient expressed understanding and agreed to proceed with virtual visits throughout the duration of the program.   Location:  Patient: Patient Home Provider: [facility] [place] [facility]   History of Present Illness: Bipolar II Disorder and PTSD   Observations/Objective: Check In: Case Manager checked in with all participants to review discharge dates, insurance authorizations, work-related documents and needs from the treatment team regarding medications. Lori Jensen stated needs and engaged in discussion.    Initial Therapeutic Activity: Counselor facilitated a check-in with Lori Jensen to assess for safety, sobriety and medication compliance.  Counselor also inquired about Lori Jensen's current emotional ratings, as well as any significant changes in thoughts, feelings or behavior since previous check in.  Lori Jensen presented for session on time and was alert, oriented x5, with no evidence or self-report of active SI/HI or A/V H.  Lori Jensen reported compliance with medication and denied use of alcohol or illicit substances.  Lori Jensen reported scores of 4/10 for depression, 9/10 for anxiety, and 5/10 for irritability.  Lori Jensen denied any recent outbursts or panic attacks.  Lori Jensen reported that a recent struggle was waking up feeling 'jittery' this morning, with high anxiety, which she believes could be related to missing a dose of medication yesterday.  She stated "I'm a little overwhelmed, emotional".  Lori Jensen reported that a recent success was getting out of the house to run errands to be productive.  Lori Jensen reported that her goal this weekend is to  attend a virtual baby shower and a birthday party.      Second Therapeutic Activity: Counselor utilized a Radio broadcast assistant with group members today to guide discussion on topic of codependency.  This handout defined codependency as excessive emotional or psychological reliance upon someone who requires support on account of an illness or addiction.  It also explained how this issue presents in dysfunctional family systems, including behavior such as denying existence of problems, rigid boundaries on communication, strained trust, lack of individuality, and reinforcement of unhealthy coping mechanisms such as substance use.  Characteristics of co-dependent people were listed for assistance with identification, such as extreme need for approval/recognition, difficulty identifying feelings, poor communication, and more.  Members were also tasked with completing a questionnaire in order to identify signs of codependency and results were discussed afterward.  This handout also offered strategies for resolving co-dependency within one's network, including increased use of assertive communication skills in order to set appropriate boundaries.  Intervention was effective, as evidenced by Lori Jensen actively participating in discussion on the subject, and completing codependency questionnaire, with 15 out of 20 positive responses.  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  Lori Jensen also reported that she grew up in a dysfunctional family, stating "We didn't talk about our problems and were discouraged from getting help for things like that.  I try to be different with my kids and talk openly about those things, but my son is in a codependent relationship and I haven't been able to help him".  Lori Jensen reported that she could identify with several codependent  traits listed, including the tendency to become hurt  when personal efforts aren't recognized, an extreme need for approval, a sense of guilt when asserting herself, lack of trust in herself, fear of abandonment, poor communication, and problems with intimacy and boundaries.  She reported that her goal will be to work on her communication skills in order to improve refusal skills, set boundaries that are more balanced within her family, and ask more clearly for what she needs from them.    Assessment and Plan: Counselor recommends that Lori Jensen remain in IOP treatment to better manage mental health symptoms, ensure stability and pursue completion of treatment plan goals. Counselor recommends adherence to crisis/safety plan, taking medications as prescribed, and following up with medical professionals if any issues arise.   Follow Up Instructions: Counselor will send Webex link for next session. Lori Jensen was advised to call back or seek an in-person evaluation if the symptoms worsen or if the condition fails to improve as anticipated.   Collaboration of Care:   Medication Management AEB Lori Ala, NP                                           Case Manager AEB Lori Nims, CNA    Patient/Guardian was advised Release of Information must be obtained prior to any record release in order to collaborate their care with an outside provider. Patient/Guardian was advised if they have not already done so to contact the registration department to sign all necessary forms in order for [place] to release information regarding their care.   Consent: Patient/Guardian gives verbal consent for treatment and assignment of benefits for services provided during this visit. Patient/Guardian expressed understanding and agreed to proceed.  I provided 165 minutes of non-face-to-face time during this encounter.   Lori Flood, LCSW, LCAS 04/02/22

## 2022-04-05 ENCOUNTER — Other Ambulatory Visit (HOSPITAL_COMMUNITY): Payer: Medicare (Managed Care) | Admitting: Psychiatry

## 2022-04-05 DIAGNOSIS — F4312 Post-traumatic stress disorder, chronic: Secondary | ICD-10-CM | POA: Diagnosis not present

## 2022-04-05 DIAGNOSIS — F3181 Bipolar II disorder: Secondary | ICD-10-CM

## 2022-04-05 NOTE — Progress Notes (Signed)
Virtual Visit via Video Note   I connected with Ferd Hibbs. Pellegrino on 04/05/22 at  9:00 AM EDT by a video enabled telemedicine application and verified that I am speaking with the correct person using two identifiers.   At orientation to the IOP program, Case Manager discussed the limitations of evaluation and management by telemedicine and the availability of in person appointments. The patient expressed understanding and agreed to proceed with virtual visits throughout the duration of the program.   Location:  Patient: Patient Home Provider: [facility]   History of Present Illness: Bipolar II Disorder and PTSD   Observations/Objective: Check In: Case Manager checked in with all participants to review discharge dates, insurance authorizations, work-related documents and needs from the treatment team regarding medications. Suzette stated needs and engaged in discussion.    Initial Therapeutic Activity: Counselor facilitated a check-in with [place] to assess for safety, sobriety and medication compliance.  Counselor also inquired about Roniesha's current emotional ratings, as well as any significant changes in thoughts, feelings or behavior since previous check in.  Alaiah presented for session on time and was alert, oriented x5, with no evidence or self-report of active SI/HI or A/V H.  Safira reported compliance with medication and denied use of alcohol or illicit substances.  Nhu reported scores of 3/10 for depression, 8/10 for anxiety, and 2/10 for anger/irritability.  Mistey denied any recent outbursts or panic attacks.  Gao reported that a recent success was attending her nieces birthday over the weekend with her sister, as well as a virtual baby shower.  Tema reported that she did experience a great deal of anxiety, but she was able to cope with this.  Latara reported that her goal today is to get out of the house to run some errands.     Second Therapeutic Activity: Counselor  covered topic of core beliefs with group today.  Counselor virtually shared a handout on the subject, which explained how everyone looks at the world differently, and two people can have the same experience, but have different interpretations of what happened.  Members were encouraged to think of these like sunglasses with different "shades" influencing perception towards positive or negative outcomes.  Examples of negative core beliefs were provided, such as "I'm unlovable", "I'm not good enough", and "I'm a bad person".  Members were asked to share which one(s) they could relate to, and then identify evidence which contradicts these beliefs.  Counselor also provided psychoeducation on positive affirmations today.  Counselor explained how these are positive statements which can be spoken out loud or recited mentally to challenge negative thoughts and/or core beliefs to improve mood and outlook each day.  Counselor shared a comprehensive list of affirmations virtually to members with different categories, including ones for health, confidence, success, and happiness.  Counselor invited members to look through this list and identify any which resonated with them, and practice saying them out loud with sincerity.  Intervention was effective, as evidenced by [name] successfully participating in discussion on the subject and reporting that she could relate to several negative core beliefs listed on the handout, such as "I'm not good enough", "I will end up alone", "I'm unattractive", "I am underserving", and "I am trapped".  Bobbyjo reported that some consequences of these core beliefs have included difficulty trusting others, putting others needs above her own, and feeling inferior in relationships.  Koryn was able to successfully challenge the core belief "I am trapped" by identifying evidence such as her ongoing  commitment to therapy, successfully getting out of the house to attend a birthday over the weekend despite  her anxiety, and motivation to continue with [facility] to learn new coping skills.  Isaura also reported that she liked several of the positive affirmations listed, such as "My self-worth is not determined by a number on a scale", "I learn from my challenges and find ways to overcome them", "All my problems have solutions", and "I compare myself only to my highest self".    Assessment and Plan: Counselor recommends that Mayan remain in IOP treatment to better manage mental health symptoms, ensure stability and pursue completion of treatment plan goals. Counselor recommends adherence to crisis/safety plan, taking medications as prescribed, and following up with medical professionals if any issues arise.   Follow Up Instructions: Counselor will send Webex link for next session. Monika was advised to call back or seek an in-person evaluation if the symptoms worsen or if the condition fails to improve as anticipated.   Collaboration of Care:   Medication Management AEB Ricky Ala, NP                                           Case Manager AEB Dellia Nims, CNA    Patient/Guardian was advised Release of Information must be obtained prior to any record release in order to collaborate their care with an outside provider. Patient/Guardian was advised if they have not already done so to contact the registration department to sign all necessary forms in order for [place] to release information regarding their care.   Consent: Patient/Guardian gives verbal consent for treatment and assignment of benefits for services provided during this visit. Patient/Guardian expressed understanding and agreed to proceed.  I provided 180 minutes of non-face-to-face time during this encounter.   Shade Flood, LCSW, LCAS 04/05/22

## 2022-04-06 ENCOUNTER — Other Ambulatory Visit (HOSPITAL_COMMUNITY): Payer: Medicare (Managed Care) | Admitting: Licensed Clinical Social Worker

## 2022-04-06 DIAGNOSIS — F4312 Post-traumatic stress disorder, chronic: Secondary | ICD-10-CM

## 2022-04-06 DIAGNOSIS — F3181 Bipolar II disorder: Secondary | ICD-10-CM
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
## 2022-04-07 ENCOUNTER — Other Ambulatory Visit (HOSPITAL_COMMUNITY): Payer: Medicare (Managed Care) | Admitting: Professional

## 2022-04-07 DIAGNOSIS — F4312 Post-traumatic stress disorder, chronic: Secondary | ICD-10-CM | POA: Insufficient documentation

## 2022-04-07 DIAGNOSIS — F3181 Bipolar II disorder: Secondary | ICD-10-CM

## 2022-04-07 DIAGNOSIS — F4310 Post-traumatic stress disorder, unspecified: Secondary | ICD-10-CM | POA: Insufficient documentation

## 2022-04-08 ENCOUNTER — Telehealth (HOSPITAL_BASED_OUTPATIENT_CLINIC_OR_DEPARTMENT_OTHER): Payer: Medicare (Managed Care) | Admitting: Psychiatry

## 2022-04-08 DIAGNOSIS — F5105 Insomnia due to other mental disorder: Secondary | ICD-10-CM | POA: Diagnosis not present

## 2022-04-08 DIAGNOSIS — F3181 Bipolar II disorder: Secondary | ICD-10-CM | POA: Diagnosis not present

## 2022-04-08 DIAGNOSIS — F4312 Post-traumatic stress disorder, chronic: Secondary | ICD-10-CM

## 2022-04-08 DIAGNOSIS — F99 Mental disorder, not otherwise specified: Secondary | ICD-10-CM

## 2022-04-08 MED ORDER — ARIPIPRAZOLE 15 MG PO TABS: 15.0000 mg | ORAL_TABLET | Freq: Every day | ORAL | 0 refills | Status: DC
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
## 2022-04-09 ENCOUNTER — Other Ambulatory Visit (HOSPITAL_COMMUNITY): Payer: Medicare (Managed Care) | Admitting: Licensed Clinical Social Worker

## 2022-04-09 DIAGNOSIS — F3181 Bipolar II disorder: Secondary | ICD-10-CM

## 2022-04-09 DIAGNOSIS — F4312 Post-traumatic stress disorder, chronic: Secondary | ICD-10-CM | POA: Diagnosis not present
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
## 2022-04-12 ENCOUNTER — Other Ambulatory Visit (HOSPITAL_BASED_OUTPATIENT_CLINIC_OR_DEPARTMENT_OTHER): Payer: Medicare (Managed Care) | Admitting: Licensed Clinical Social Worker

## 2022-04-12 DIAGNOSIS — F4312 Post-traumatic stress disorder, chronic: Secondary | ICD-10-CM | POA: Diagnosis not present

## 2022-04-12 DIAGNOSIS — F3181 Bipolar II disorder: Secondary | ICD-10-CM

## 2022-04-12 NOTE — Progress Notes (Signed)
Virtual Visit via Video Note   I connected with Lori Jensen on 04/12/22 at  9:00 AM EDT by a video enabled telemedicine application and verified that I am speaking with the correct person using two identifiers.   At orientation to the IOP program, Case Manager discussed the limitations of evaluation and management by telemedicine and the availability of in person appointments. The patient expressed understanding and agreed to proceed with virtual visits throughout the duration of the program.   Location:  Patient: Patient Home Provider: Counselor Home Office   History of Present Illness: Bipolar II Disorder and PTSD   Observations/Objective: Check In: Case Manager checked in with all participants to review discharge dates, insurance authorizations, work-related documents and needs from the treatment team regarding medications. Lori Jensen stated needs and engaged in discussion.    Initial Therapeutic Activity: Counselor facilitated a check-in with Lori Jensen to assess for safety, sobriety and medication compliance.  Counselor also inquired about Lori Jensen's current emotional ratings, as well as any significant changes in thoughts, feelings or behavior since previous check in.  Lori Jensen presented for session on time and was alert, oriented x5, with no evidence or self-report of active SI/HI or A/V H.  Lori Jensen reported compliance with medication and denied use of alcohol or illicit substances.  Lori Jensen reported scores of 4/10 for depression, 6/10 for anxiety, and 1/10 for anger/irritability.  Lori Jensen denied any recent outbursts or panic attacks.  Lori Jensen reported that a recent success was getting a call from her son over the weekend asking for help.  Lori Jensen reported that she was glad he reached back out, but a struggle is worrying about him returning to an unhealthy relationship, stating "That whole situation has me stressed out and worried".  Lori Jensen was receptive to feedback from counselor and group members on how  to handle boundaries within the family to avoid overwhelming herself.  Lori Jensen reported that her goal today is to distract herself from her son's situation by taking care of household chores.     Second Therapeutic Activity: Counselor introduced topic of stress management today.  Counselor provided definition of stress as feeling tense, overwhelmed, worn out, and/or exhausted, and noted that in small amounts, stress can be motivating until things become too overwhelming to manage.  Counselor also explained how stress can be acute (brief but intense) or chronic (long-lasting) and this can impact the severity of symptoms one can experience in the physical, emotional, and behavioral categories.  Counselor inquired about members' specific stressors, how long they have been prevalent, and the various symptoms that tend to manifest as a result.  Counselor also offered several stress management strategies to help improve members' coping ability, including journaling, gratitude practice, relaxation techniques, and time management tips.  Counselor also explained that research has shown a strong support network composed of trusted family, friends, or community members can increase resilience in times of stress, and inquired about who members can reach out to for help in managing stressors.  Counselor encouraged members to consider discussing stressor 'red flags' with their close supports that can be monitored and strategies for assisting them in times of crisis.  Intervention was effective, as evidenced by Lori Jensen actively participating in discussion on subject, reporting that her most significant stressors include having conflict with family, experiencing lack of confidence, loneliness, pain and fatigue.  Lori Jensen was able to identify several warning signs related to stress, including chest pain, muscle tension, heart palpitations, panic attacks, fatigue/lack of energy, anxiety, forgetfulness, crying, overeating, and worrying.  Lori Jensen reported that her stress management goal is to establish realistic boundaries with her family to ensure that she gets at least 1 hour of self-care time each day to focus on relaxing activities.  Lori Jensen also expressed receptiveness to several stress management strategies practiced today in session, including deep breathing, journaling with a stress tracker, and mindfulness.     Assessment and Plan: Counselor recommends that Lori Jensen remain in IOP treatment to better manage mental health symptoms, ensure stability and pursue completion of treatment plan goals. Counselor recommends adherence to crisis/safety plan, taking medications as prescribed, and following up with medical professionals if any issues arise.   Follow Up Instructions: Counselor will send Webex link for next session. Lori Jensen was advised to call back or seek an in-person evaluation if the symptoms worsen or if the condition fails to improve as anticipated.   Collaboration of Care:   Medication Management AEB Lori Ala, NP                                           Case Manager AEB Dellia Nims, CNA    Patient/Guardian was advised Release of Information must be obtained prior to any record release in order to collaborate their care with an outside provider. Patient/Guardian was advised if they have not already done so to contact the registration department to sign all necessary forms in order for [place] to release information regarding their care.   Consent: Patient/Guardian gives verbal consent for treatment and assignment of benefits for services provided during this visit. Patient/Guardian expressed understanding and agreed to proceed.  I provided 180 minutes of non-face-to-face time during this encounter.   Shade Flood, LCSW, LCAS 04/12/22

## 2022-04-12 NOTE — Progress Notes (Signed)
BH MD/PA/NP IOP Progress Note  04/12/2022 11:46 AM Lori Jensen  MRN:  [id number] Virtual Visit via Video Note   I connected with Lori Jensen on 04/12/22 at  9:00 AM EDT by a video enabled telemedicine application and verified that I am speaking with the correct person using two identifiers.   Location: Patient: Home Provider: Office   I discussed the limitations of evaluation and management by telemedicine and the availability of in person appointments. The patient expressed understanding and agreed to proceed.     I discussed the assessment and treatment plan with the patient. The patient was provided an opportunity to ask questions and all were answered. The patient agreed with the plan and demonstrated an understanding of the instructions.   The patient was advised to call back or seek an in-person evaluation if the symptoms worsen or if the condition fails to improve as anticipated.   I provided 15  minutes of non-face-to-face time during this encounter.   Armando Reichert MD  Chief Complaint: Medication management  HPI: Lori Jensen is a 51 year old female with past psychiatric history of depression, anxiety, posttraumatic stress disorder and panic disorder who was seen virtually for follow-up in [facility] program.  Pt reports that her mood is "not too good".  She reports worsening of her depression and anxiety due to recent stressors.  She reports her son is in a toxic relationship with his girlfriend and they are codependent on each other.  She reports that his girlfriend gaslights him and overdose on pills sometimes to gets his attention.  She reports that her son was living with his girlfriend but now wants to come back to her.  Her daughter is also pregnant and is going to deliver soon.  She is really anxious to fly to [place] as this is the second time she would be flying on plane.  She is really anxious about navigating in the airport.  She reports hopelessness and having low  confidence.  She reports poor sleep and wakes up frequently.  On average she gets 5-6 hours of sleep at night.  She reports that she overeats sometimes when she is stressed.  Currently, she denies any suicidal ideations, homicidal ideations, auditory and visual hallucinations.  Discussed starting low-dose gabapentin during the daytime also for anxiety.  Patient states that she has a appointment with her outpatient psychiatrist on 27th and will discuss this option with her.  She would like to keep her medication same until then.  She denies any medication side effects and has been tolerating it well.  She denies any other concerns. Patient is alert and oriented x 4,  calm, cooperative, and fully engaged in conversation during the encounter.  Her mood is depressed and affect is anxious, depressed and tearful.  Her thought process is linear with coherent speech . She does not appear to be responding to internal/external stimuli .     visit Diagnosis:    ICD-10-CM   1. Bipolar 2 disorder (HCC)  F31.81     2. Chronic post-traumatic stress disorder (PTSD)  F43.12       Past Psychiatric History: See H&P  Past Medical History:  Past Medical History:  Diagnosis Date   Anxiety    Depression    Dysmenorrhea    Fibromyalgia 11/2017   Heart murmur    Hypertension    PONV (postoperative nausea and vomiting)    Uterine fibroid     Past Surgical History:  Procedure Laterality Date  DILATION AND CURETTAGE OF UTERUS     DILATION AND CURETTAGE OF UTERUS  01/16/2013   Procedure: DILATATION AND CURETTAGE;  Surgeon: Gus Height, MD;  Location: [facility];  Service: Gynecology;;   ENDOMETRIAL ABLATION     uterine ablation  08/13/2011    Family Psychiatric History: See H&P  Family History:  Family History  Problem Relation Age of Onset   Heart disease Mother    Anxiety disorder Mother    Depression Mother    Stomach cancer Father    Heart disease Father    Other Father        non hodgkins lymphoma    Alcohol abuse Maternal Aunt    Drug abuse Maternal Aunt    Alcohol abuse Brother    Alcohol abuse Maternal Uncle    Alcohol abuse Paternal Uncle    Colon cancer Neg Hx     Social History:  Social History   Socioeconomic History   Marital status: Divorced    Spouse name: Not on file   Number of children: 2   Years of education: Not on file   Highest education level: GED or equivalent  Occupational History   Occupation: MORTGAGE Therapist, art: [employer]  Tobacco Use   Smoking status: Former    Years: 20.00    Types: Cigarettes, E-cigarettes    Quit date: 08/13/2016    Years since quitting: 5.6   Smokeless tobacco: Never   Tobacco comments:    Reports she does Vap occasionally   Vaping Use   Vaping Use: Every day   Start date: 03/27/2018   Substances: Nicotine, Flavoring   Devices: njoy  Substance and Sexual Activity   Alcohol use: Not Currently    Comment: occasionally   Drug use: No   Sexual activity: Yes    Partners: Male    Birth control/protection: None, Surgical  Other Topics Concern   Not on file  Social History Narrative   Reports at times has been emotionally mistreated by partner but denies any other issues.    Social Determinants of Health   Financial Resource Strain: Medium Risk (09/09/2017)   Overall Financial Resource Strain (CARDIA)    Difficulty of Paying Living Expenses: Somewhat hard  Food Insecurity: Food Insecurity Present (09/09/2017)   Hunger Vital Sign    Worried About Running Out of Food in the Last Year: Sometimes true    Ran Out of Food in the Last Year: Sometimes true  Transportation Needs: No Transportation Needs (09/09/2017)   PRAPARE - Hydrologist (Medical): No    Lack of Transportation (Non-Medical): No  Physical Activity: Unknown (09/09/2017)   Exercise Vital Sign    Days of Exercise per Week: 0 days    Minutes of Exercise per Session: Not on file  Stress: Stress Concern Present  (09/09/2017)   [place]    Feeling of Stress : Very much  Social Connections: Socially Isolated (09/09/2017)   Social Connection and Isolation Panel [NHANES]    Frequency of Communication with Friends and Family: Once a week    Frequency of Social Gatherings with Friends and Family: Never    Attends Religious Services: Never    Marine scientist or Organizations: No    Attends Archivist Meetings: Never    Marital Status: Divorced    Allergies:  Allergies  Allergen Reactions   Codeine Anaphylaxis   Amoxicillin Rash  Has patient had a PCN reaction causing immediate rash, facial/tongue/throat swelling, SOB or lightheadedness with hypotension: no Has patient had a PCN reaction causing severe rash involving mucus membranes or skin necrosis: no Has patient had a PCN reaction that required hospitalization no Has patient had a PCN reaction occurring within the last 10 years: yes If all of the above answers are "NO", then may proceed with Cephalosporin use.    Doxycycline Rash   Lamictal [Lamotrigine] Rash    Metabolic Disorder Labs: No results found for: "HGBA1C", "MPG" No results found for: "PROLACTIN" No results found for: "CHOL", "TRIG", "HDL", "CHOLHDL", "VLDL", "LDLCALC" No results found for: "TSH"  Therapeutic Level Labs: No results found for: "LITHIUM" No results found for: "VALPROATE" No results found for: "CBMZ"  Current Medications: Current Outpatient Medications  Medication Sig Dispense Refill   ARIPiprazole (ABILIFY) 15 MG tablet Take 1 tablet (15 mg total) by mouth daily. 90 tablet 0   buPROPion (WELLBUTRIN XL) 150 MG 24 hr tablet Take 3 tablets (450 mg total) by mouth every morning. 270 tablet 0   clonazePAM (KLONOPIN) 1 MG tablet Take 1 tablet (1 mg total) by mouth 2 (two) times daily as needed for anxiety. 60 tablet 0   cyclobenzaprine (FLEXERIL) 5 MG tablet Take 5 mg by mouth 2  (two) times daily.     fosinopril-hydrochlorothiazide (MONOPRIL-HCT) 20-12.5 MG tablet Take 1 tablet by mouth daily.     gabapentin (NEURONTIN) 400 MG capsule Take 1 capsule (400 mg total) by mouth at bedtime. 90 capsule 3   hydrochlorothiazide (HYDRODIURIL) 25 MG tablet Take 12.5 mg by mouth daily.  (Patient not taking: Reported on 04/08/2022)     lisinopril (PRINIVIL,ZESTRIL) 10 MG tablet Take 10 mg by mouth daily. (Patient not taking: Reported on 04/08/2022)     pantoprazole (PROTONIX) 20 MG tablet Take 1 tablet (20 mg total) by mouth 2 (two) times daily. 30 tablet 0   PROAIR HFA 108 (90 Base) MCG/ACT inhaler Inhale 1-2 puffs into the lungs every 6 (six) hours as needed for wheezing or shortness of breath.  2   saccharomyces boulardii (FLORASTOR) 250 MG capsule Take 250 mg by mouth 2 (two) times daily.     topiramate (TOPAMAX) 25 MG tablet Take 50 mg by mouth daily.     traZODone (DESYREL) 150 MG tablet Take 1 tablet (150 mg total) by mouth at bedtime. (Patient not taking: Reported on 04/08/2022) 90 tablet 0   No current facility-administered medications for this visit.     Musculoskeletal: Strength & Muscle Tone: Not able to assess due to virtual visit Gait & Station: Not able to assess due to virtual visit Patient leans: N/A  Psychiatric Specialty Exam: Review of Systems  There were no vitals taken for this visit.There is no height or weight on file to calculate BMI.  General Appearance: Casual  Eye Contact:  Fair  Speech:  Normal Rate  Volume:  Normal  Mood:  Anxious, Depressed, and Hopeless  Affect:  Depressed and Tearful  Thought Process:  Coherent and Linear  Orientation:  Full (Time, Place, and Person)  Thought Content: Logical   Suicidal Thoughts:  No  Homicidal Thoughts:  No  Memory:  Immediate;   Good Recent;   Good Remote;   Good  Judgement:  Good  Insight:  Good  Psychomotor Activity:  Normal  Concentration:  Concentration: Good and Attention Span: Good  Recall:   Good  Fund of Knowledge: Good  Language: Good  Akathisia:  No  Handed:  Right  AIMS (if indicated): not done  Assets:  Communication Skills Desire for Improvement Resilience Social Support  ADL's:  Intact  Cognition: WNL  Sleep:  Fair   Screenings: GAD-7    Health and safety inspector from 10/04/2017 in [facility] Counselor from 09/08/2017 in [facility] Counselor from 03/04/2017 in [facility] Counsel[facility] from 02/22/2017 in [facility]  Total GAD-7 Score '17 19 12 20      '$ PHQ2-9    Flowsheet Row Video Visit from 04/08/2022 in [facility] Counselor from 03/17/2022 in [facility] Video Visit from 11/19/2021 in [facility] Video Visit from 10/01/2021 in [facility] Video Visit from 09/03/2021 in [facility]  PHQ-2 Total Score '4 4 3 2 6  '$ PHQ-9 Total Score '19 22 8 10 14      '$ Flowsheet Row Video Visit from 04/08/2022 in [facility] Counselor from 03/17/2022 in [facility] Video Visit from 11/19/2021 in [facility]  C-SSRS RISK CATEGORY Error: Q3, 4, or 5 should not be populated when Q2 is No Error: Question 6 not populated No Risk        Assessment and Plan:  Lori Jensen is a 51 year old female with past psychiatric history of depression, anxiety, posttraumatic stress disorder and panic disorder who was seen virtually for follow-up in [facility] program. Patient reports worsening of depression and anxiety.  Discussed adding low-dose of gabapentin during daytime also.  She would like to discuss this with her outpatient psychiatrist and does not want to change her medications at  this time.  Patient to continue intensive outpatient programming -Continue medications as indicated     Collaboration of Care: Medication Management AEB continue Wellbutrin Abilify and gabapentin follow-up and Primary Care Provider AEB Doyne Keel   Patient/Guardian was advised Release of Information must be obtained prior to any record release in order to collaborate their care with an outside provider. Patient/Guardian was advised if they have not already done so to contact the registration department to sign all necessary forms in order for [place] to release information regarding their care.    Consent: Patient/Guardian gives verbal consent for treatment and assignment of benefits for services provided during this visit. Patient/Guardian expressed understanding and agreed to proceed.     Armando Reichert, MD 04/12/2022, 11:46 AM

## 2022-04-13 ENCOUNTER — Other Ambulatory Visit (HOSPITAL_COMMUNITY): Payer: Medicare (Managed Care) | Admitting: Licensed Clinical Social Worker

## 2022-04-13 ENCOUNTER — Ambulatory Visit (HOSPITAL_COMMUNITY): Payer: Medicare (Managed Care)

## 2022-04-13 DIAGNOSIS — F4312 Post-traumatic stress disorder, chronic: Secondary | ICD-10-CM

## 2022-04-13 DIAGNOSIS — F3181 Bipolar II disorder: Secondary | ICD-10-CM

## 2022-04-13 NOTE — Progress Notes (Signed)
Virtual Visit via Video Note   I connected with Lori Jensen. Lori Jensen on 04/13/22 at  9:00 AM EDT by a video enabled telemedicine application and verified that I am speaking with the correct person using two identifiers.   At orientation to the IOP program, Case Manager discussed the limitations of evaluation and management by telemedicine and the availability of in person appointments. The patient expressed understanding and agreed to proceed with virtual visits throughout the duration of the program.   Location:  Patient: Patient Home Provider: OPT [place] Office   History of Present Illness: Bipolar II Disorder and PTSD   Observations/Objective: Check In: Case Manager checked in with all participants to review discharge dates, insurance authorizations, work-related documents and needs from the treatment team regarding medications. Lori Jensen stated needs and engaged in discussion.    Initial Therapeutic Activity: Counselor facilitated a check-in with Lori Jensen to assess for safety, sobriety and medication compliance.  Counselor also inquired about Lori Jensen's current emotional ratings, as well as any significant changes in thoughts, feelings or behavior since previous check in.  Lori Jensen presented for session on time and was alert, oriented x5, with no evidence or self-report of active SI/HI or A/V H.  Lori Jensen reported compliance with medication and denied use of alcohol or illicit substances.  Lori Jensen reported scores of 4/10 for depression, 9/10 for anxiety, and 7/10 for anger/irritability.  Lori Jensen denied any recent outbursts or panic attacks.  Lori Jensen reported that an ongoing struggle is helping her son cope with his toxic relationship, stating "I hate that he is having to go through all this".  Lori Jensen reported that her success was picking up a journal to start a new self-care activity.  Lori Jensen reported that her goal today is to write in her new journal after group.    Second Therapeutic Activity: Counselor engaged  the group in discussion on managing work/life balance today to improve mental health and wellness.  Counselor explained how finding balance between responsibilities at home and work place can be challenging, lead to increased stress, and this has been further complicated by recent pandemic leading to unemployment, more virtual work, and blurring of lines between home as a place of rest or work duties.  Counselor facilitated discussion on what challenges members have faced with this issue historically, as well as what, if any, issues have arisen following pandemic. Counselor also discussed strategies for improving work/life balance while members work on their mental health during treatment.  Some of these included keeping track of time management; creating a list of priorities and scaling importance; setting realistic, measurable goals each day; establishing boundaries; taking care of health needs; and nurturing relationships at home and work for support.  Counselor inquired about areas where members feel they are excelling, as well as areas they could focus on during treatment. Intervention was effective, as evidenced by Lori Jensen actively participating in discussion on topic and reporting that she experienced burnout at her previous job, stating "I was at the same place for 22 years, and for the last several years of it I was working in burnout mode.  I blew up at work on several occasions".  Lori Jensen reported that she experienced several symptoms of burnout, including feeling tired and drained, lowered immunity, frequent headaches, change in sleep habits, self-doubt, lack of motivation, feeling overwhelmed, procrastination, and isolation from others.  Lori Jensen reported that there have also been numerous warning signs such as worrying about work after hours, taking work home after hours, having trouble sleeping, feeling emotionally and  physically drained, and falling out of shape.  Lori Jensen was receptive to suggestions  offered today for addressing work life imbalance, including keeping an activities time log, prioritizing time each day for self-care activities while out of work, creating a daily to-do list, setting boundaries to ensure that work is not brought home, and building assertive communication skills in order to say "No" to unreasonable demands.    Assessment and Plan: Counselor recommends that Lori Jensen remain in IOP treatment to better manage mental health symptoms, ensure stability and pursue completion of treatment plan goals. Counselor recommends adherence to crisis/safety plan, taking medications as prescribed, and following up with medical professionals if any issues arise.   Follow Up Instructions: Counselor will send Webex link for next session. Lori Jensen was advised to call back or seek an in-person evaluation if the symptoms worsen or if the condition fails to improve as anticipated.   Collaboration of Care:   Medication Management AEB Lori Ala, NP                                           Case Manager AEB Lori Nims, CNA    Patient/Guardian was advised Release of Information must be obtained prior to any record release in order to collaborate their care with an outside provider. Patient/Guardian was advised if they have not already done so to contact the registration department to sign all necessary forms in order for [place] to release information regarding their care.   Consent: Patient/Guardian gives verbal consent for treatment and assignment of benefits for services provided during this visit. Patient/Guardian expressed understanding and agreed to proceed.  I provided 180 minutes of non-face-to-face time during this encounter.   Lori Flood, LCSW, LCAS 04/13/22

## 2022-04-14 ENCOUNTER — Ambulatory Visit (HOSPITAL_COMMUNITY): Payer: Medicare (Managed Care) | Admitting: Psychiatry

## 2022-04-14 ENCOUNTER — Telehealth (HOSPITAL_COMMUNITY): Payer: Self-pay | Admitting: Psychiatry

## 2022-04-15 ENCOUNTER — Ambulatory Visit (HOSPITAL_COMMUNITY): Payer: Medicare (Managed Care) | Admitting: Psychiatry

## 2022-04-15 ENCOUNTER — Telehealth (HOSPITAL_COMMUNITY): Payer: Self-pay | Admitting: Psychiatry

## 2022-04-15 NOTE — Progress Notes (Signed)
Virtual Visit via Video Note   I connected with Ferd Hibbs. [place] on 04/07/22 at  9:00 AM EDT by a video enabled telemedicine application and verified that I am speaking with the correct person using two identifiers.   At orientation to the IOP program, Case Manager discussed the limitations of evaluation and management by telemedicine and the availability of in person appointments. The patient expressed understanding and agreed to proceed with virtual visits throughout the duration of the program.   Location:  Patient: Patient Home Provider: [facility] Office   History of Present Illness: Bipolar II Disorder and PTSD   Observations/Objective: Check In: Case Manager checked in with all participants to review discharge dates, insurance authorizations, work-related documents and needs from the treatment team regarding medications. Sherrell stated needs and engaged in discussion.    Initial Therapeutic Activity: Counselor facilitated a check-in with Shylee to assess for safety, sobriety and medication compliance.  Counselor also inquired about Brinlyn's current emotional ratings, as well as any significant changes in thoughts, feelings or behavior since previous check in.  Aubree presented for session on time and was alert, oriented x5, with no evidence or self-report of active SI/HI or A/V H.  Serena reported compliance with medication and denied use of alcohol or illicit substances.  Shawne denied any recent outbursts or panic attacks.  Galena reported that she had a "pretty down" afternoon and spent hours talking to her daughter on the phone. Jeweliana reported that her goal today is to prepare for a yard sale with her niece this weekend and to clean her kitchen.      Second Therapeutic Activity: Counselor discussed topic of managing goals with group.  Cln discussed breaking goals down using ABC method/prioritizing and Zone Method. Cln discussed the importance of not viewing tasks through lens of  "black and white thinking" and breaking tasks into manageable pieces. Intervention was effective, as evidenced by [name] participating in breaking down current goals of housework into manageable pieces to not feel overwhelmed.    Third Therapeutic Activity: Counselor discussed topic of Mindfulness. Cln showed TedTalk "Don't Try to Be Mindful" and group discussed. Cln and group discussed how to be mindful and importance of using. Intervention was effective, as evidenced by [name] participating in discussion and identifying times she can "check in" and be mindful instead of living life on "autopilot." Shanessa identified the activity of driving and taking medications to start being mindful during every day activities   Assessment and Plan: Counselor recommends that Awendaw remain in IOP treatment to better manage mental health symptoms, ensure stability and pursue completion of treatment plan goals. Counselor recommends adherence to crisis/safety plan, taking medications as prescribed, and following up with medical professionals if any issues arise.   Follow Up Instructions: Counselor will send Webex link for next session. Jola was advised to call back or seek an in-person evaluation if the symptoms worsen or if the condition fails to improve as anticipated.   Collaboration of Care:   Medication Management AEB Ricky Ala, NP and Dr. Rosita Kea                                          Case Manager AEB Dellia Nims, CNA    Patient/Guardian was advised Release of Information must be obtained prior to any record release in order to collaborate their care with an outside provider. Patient/Guardian was advised if  they have not already done so to contact the registration department to sign all necessary forms in order for [place] to release information regarding their care.   Consent: Patient/Guardian gives verbal consent for treatment and assignment of benefits for services provided during this visit. Patient/Guardian  expressed understanding and agreed to proceed.  I provided 180 minutes of non-face-to-face time during this encounter.   Loistine Chance, [facility] 04/07/22

## 2022-04-16 ENCOUNTER — Ambulatory Visit (HOSPITAL_COMMUNITY): Payer: Medicare (Managed Care) | Admitting: Psychiatry

## 2022-04-16 ENCOUNTER — Telehealth (HOSPITAL_COMMUNITY): Payer: Self-pay | Admitting: Psychiatry

## 2022-04-19 ENCOUNTER — Other Ambulatory Visit (HOSPITAL_COMMUNITY): Payer: Medicare (Managed Care) | Admitting: Licensed Clinical Social Worker

## 2022-04-19 DIAGNOSIS — F3181 Bipolar II disorder: Secondary | ICD-10-CM

## 2022-04-19 DIAGNOSIS — F4312 Post-traumatic stress disorder, chronic: Secondary | ICD-10-CM | POA: Diagnosis not present

## 2022-04-19 NOTE — Progress Notes (Signed)
Virtual Visit via Video Note   I connected with Lori Jensen on 04/19/22 at  9:00 AM EDT by a video enabled telemedicine application and verified that I am speaking with the correct person using two identifiers.   At orientation to the IOP program, Case Manager discussed the limitations of evaluation and management by telemedicine and the availability of in person appointments. The patient expressed understanding and agreed to proceed with virtual visits throughout the duration of the program.   Location:  Patient: Patient Home Provider: [facility] [place] [facility]   History of Present Illness: Bipolar II Disorder and PTSD   Observations/Objective: Check In: Case Manager checked in with all participants to review discharge dates, insurance authorizations, work-related documents and needs from the treatment team regarding medications. Lori Jensen stated needs and engaged in discussion.    Initial Therapeutic Activity: Counselor facilitated a check-in with Lori Jensen to assess for safety, sobriety and medication compliance.  Counselor also inquired about Lori Jensen's current emotional ratings, as well as any significant changes in thoughts, feelings or behavior since previous check in.  Lori Jensen presented for session on time and was alert, oriented x5, with no evidence or self-report of active SI/HI or A/V H.  Lori Jensen reported compliance with medication and denied use of alcohol or illicit substances.  Lori Jensen reported scores of 3/10 for depression, 8/10 for anxiety, and 0/10 for anger/irritability.  Lori Jensen denied any recent outbursts or panic attacks.  Lori Jensen reported that a recent success was learning that her grandson was born over the weekend, stating "He is in a special nursery right now, but things are looking up.  He's beautiful".  Lori Jensen reported that a recent struggle has been worrying about the health of her daughter and grandson following this news, stating "I've been up and down a lot because of that".  Lori Jensen  reported that her goal today is to "Put more time into myself" by setting aside time for self-care.     Second Therapeutic Activity: Counselor introduced topic of grounding skills today.  Counselor defined these as simple strategies one can use to help detach from difficult thoughts or feelings temporarily by focusing on something else.  Counselor noted that grounding will not solve the problem at hand, but can provide the practitioner with time to regain control over their thoughts and/or feelings and prevent the situation from getting worse (i.e. interrupting a panic attack).  Counselor divided these into three categories (mental, physical, and soothing) and then provided examples of each which group members could practice during session.  Some of these included describing one's environment in detail or playing a categories game with oneself for mental category, taking a hot bath/shower, stretching, or carrying a grounding object for physical category, and saying kind statements, or visualizing people one cares about for soothing category.  Counselor inquired about which techniques members have used with success in the past, or will commit to learning, practicing, and applying now to improve coping abilities.  Intervention was effective, as evidenced by Lori Jensen participating in discussion on the subject, trying out several of the techniques during session, and expressing interest in adding several to her available coping skills, such as playing a categories game involving listing breeds of dogs, imagining a peaceful setting in her mind like visiting the beach, reading biblical scripture, thinking of a funny joke or scenario that makes her laugh, counting to 10, splashing cold water on her skin, squeezing a pillow, using a round smooth stone as a grounding object, doing a yoga routine, practicing  mindful eating, practicing square breathing, telling herself kind statements, binge watch a favorite TV show, picturing  people that she cares about like her grandson, reciting the serenity prayer, planning a safe treat for herself like a warm bath or pedicure, and making plans to see her grandson soon since this is exciting.    Assessment and Plan: Counselor recommends that [name] remain in IOP treatment to better manage mental health symptoms, ensure stability and pursue completion of treatment plan goals. Counselor recommends adherence to crisis/safety plan, taking medications as prescribed, and following up with medical professionals if any issues arise.   Follow Up Instructions: Counselor will send Webex link for next session. Lori Jensen was advised to call back or seek an in-person evaluation if the symptoms worsen or if the condition fails to improve as anticipated.   Collaboration of Care:   Medication Management AEB Ricky Ala, NP                                           Case Manager AEB Dellia Nims, CNA    Patient/Guardian was advised Release of Information must be obtained prior to any record release in order to collaborate their care with an outside provider. Patient/Guardian was advised if they have not already done so to contact the registration department to sign all necessary forms in order for [place] to release information regarding their care.   Consent: Patient/Guardian gives verbal consent for treatment and assignment of benefits for services provided during this visit. Patient/Guardian expressed understanding and agreed to proceed.  I provided 140 minutes of non-face-to-face time during this encounter.   Shade Flood, LCSW, LCAS 04/19/22

## 2022-04-20 ENCOUNTER — Other Ambulatory Visit (HOSPITAL_COMMUNITY): Payer: Medicare (Managed Care) | Admitting: Psychiatry

## 2022-04-20 DIAGNOSIS — F3181 Bipolar II disorder: Secondary | ICD-10-CM

## 2022-04-20 DIAGNOSIS — F4312 Post-traumatic stress disorder, chronic: Secondary | ICD-10-CM
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
## 2022-04-20 NOTE — Progress Notes (Signed)
  [facility] Intensive Outpatient Program Discharge Summary  Lori Jensen [id number]  Admission date: 03/17/22 Discharge date: 04/21/22  Reason for admission: Worsening depression and anxiety  Chemical Use History: Denies   Progress in Program Toward Treatment Goals: Progressing  Progress (rationale): Lori Jensen is a 51 year old female with past psychiatric history of depression, anxiety, posttraumatic stress disorder and panic disorder who was seen virtually for follow-up in IOP program.  Patient started PHP program on 03/17/2022.  This morning, patient reports that she has been progressing really well and benefited from IOP program.  She reports improvement in her sleep and appetite.  She reports improvement in her depression and anxiety but still has some anxiety as her daughter recently gave birth to a baby who had some complications yesterday.  She reports that baby is doing well now and she is planning to go to [place] to her daughter soon.  She has some anxiety about flying to [place] and navigating through airport.  She denies any side effects from medications and has been tolerating it well.  She reports that she is going to follow-up with her therapist and [place] for medication management.  She denies any need for refills. Plan Patient will be discharged tomorrow on 04/21/2022. Recommend patient to continue her psych medications and follow-up with outpatient providers for medication management and therapy.  Collaboration of Care: Other PHP team  Patient/Guardian was advised Release of Information must be obtained prior to any record release in order to collaborate their care with an outside provider. Patient/Guardian was advised if they have not already done so to contact the registration department to sign all necessary forms in order for [place] to release information regarding their care.   Consent: Patient/Guardian gives verbal consent for treatment and  assignment of benefits for services provided during this visit. Patient/Guardian expressed understanding and agreed to proceed.   Armando Reichert, MD PGY3 Psychiatry Resident  [facility]

## 2022-04-21 ENCOUNTER — Ambulatory Visit (HOSPITAL_COMMUNITY): Payer: Medicare (Managed Care)

## 2022-04-21 ENCOUNTER — Telehealth (HOSPITAL_COMMUNITY): Payer: Self-pay | Admitting: Psychiatry

## 2022-04-22 ENCOUNTER — Telehealth (HOSPITAL_COMMUNITY): Payer: Self-pay | Admitting: Psychiatry

## 2022-04-22 ENCOUNTER — Telehealth (HOSPITAL_BASED_OUTPATIENT_CLINIC_OR_DEPARTMENT_OTHER): Payer: Medicare (Managed Care) | Admitting: Psychiatry

## 2022-04-22 DIAGNOSIS — F4312 Post-traumatic stress disorder, chronic: Secondary | ICD-10-CM | POA: Diagnosis not present

## 2022-04-22 DIAGNOSIS — F3181 Bipolar II disorder: Secondary | ICD-10-CM

## 2022-04-22 DIAGNOSIS — F99 Mental disorder, not otherwise specified: Secondary | ICD-10-CM

## 2022-04-22 DIAGNOSIS — F5105 Insomnia due to other mental disorder: Secondary | ICD-10-CM | POA: Diagnosis not present

## 2022-04-22 MED ORDER — BUPROPION HCL ER (XL) 150 MG PO TB24: 450.0000 mg | ORAL_TABLET | ORAL | 0 refills | Status: DC

## 2022-04-22 MED ORDER — GABAPENTIN 400 MG PO CAPS: 400.0000 mg | ORAL_CAPSULE | Freq: Every day | ORAL | 3 refills | Status: DC

## 2022-04-22 NOTE — Patient Instructions (Signed)
D:  Patient is scheduled for discharge on 04-23-22.  A:  Follow up with Dr. Doyne Keel and Morley Kos, LCSW.  Encouraged support groups.  R:  Patient receptive.
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
## 2022-04-23 ENCOUNTER — Other Ambulatory Visit (HOSPITAL_COMMUNITY): Payer: Medicare (Managed Care) | Admitting: Licensed Clinical Social Worker

## 2022-04-23 DIAGNOSIS — F4312 Post-traumatic stress disorder, chronic: Secondary | ICD-10-CM | POA: Diagnosis not present

## 2022-04-23 DIAGNOSIS — F3181 Bipolar II disorder: Secondary | ICD-10-CM

## 2022-04-23 NOTE — Progress Notes (Signed)
Virtual Visit via Video Note   I connected with Lori Jensen on 04/23/22 at  9:00 AM EDT by a video enabled telemedicine application and verified that I am speaking with the correct person using two identifiers.   At orientation to the IOP program, Case Manager discussed the limitations of evaluation and management by telemedicine and the availability of in person appointments. The patient expressed understanding and agreed to proceed with virtual visits throughout the duration of the program.   Location:  Patient: Patient Home Provider: [facility] [place] [place] [facility]   History of Present Illness: Bipolar II Disorder and PTSD   Observations/Objective: Check In: Case Manager checked in with all participants to review discharge dates, insurance authorizations, work-related documents and needs from the treatment team regarding medications. Lori Jensen stated needs and engaged in discussion.    Initial Therapeutic Activity: Counselor facilitated a check-in with Lori Jensen to assess for safety, sobriety and medication compliance.  Counselor also inquired about Lori Jensen's current emotional ratings, as well as any significant changes in thoughts, feelings or behavior since previous check in.  Lori Jensen presented for session on time and was alert, oriented x5, with no evidence or self-report of active SI/HI or A/V H.  Lori Jensen reported compliance with medication and denied use of alcohol or illicit substances.  Lori Jensen reported scores of 4/10 for depression, 8/10 for anxiety, and 1/10 for irritability.  Lori Jensen denied any recent outbursts or panic attacks.  Lori Jensen reported that a recent success was learning that her grandchild could finally be released home from the hospital.  Lori Jensen reported that an ongoing struggle is managing anxiety related to concerns about her family.  Lori Jensen reported that her goal this weekend is to make preparations so that she can fly out of the state to see her daughter and the grandchild Monday.      Second  Therapeutic Activity: Counselor invited group members to practice a new meditation exercise to add to self-care routine in order to better manage daily stress.  Counselor provided instruction on process of getting comfortable, achieving relaxing breathing rhythm, and visualizing a protective light moving across the muscle groups within their bodies to identify and eliminate areas of tension associated with stress over course of 15 minutes practice.  Counselor inquired about how members mentally and physically felt afterward, any challenges they faced in concentration, and whether they would be motivated to practice this in free time to improve coping ability.  Intervention was effective, as evidenced by Lori Jensen actively engaging in exercise, and reporting that it was effective in calming her mind and body, stating "My shoulders do feel less tense.  I also noticed some negative thoughts popping in, but I just focused on my breathing".  She reported that she would plan to practice this again in the future.    Third Therapeutic Activity: Counselor discussed topic of gratitude journaling with members as a form of self-care.  Counselor virtually shared a handout with the group today which explained the benefits of this practice, including reduction in stress, increased happiness, and self-esteem.  Tips were also provided to aid in practice, such as taking time with entries, writing about people one is grateful for, and setting goal for two entries per week for at least 10-20 minutes at a time.  Counselor also provided group members with a variety of journaling prompts to choose from today, and encouraged each member to take time to write about something they are grateful for, with examples such as "Something beautiful I recently saw was..", "Something  I can be proud of is.", "A reason to be excited for the future is." and more.  Members were encouraged to share their entry with the group, along with their perspective on  the activity and motivation level towards making this a habit. Intervention was effective, as evidenced by Lori Jensen participating in journaling activity, and choosing the prompts "A reason to be excited for the future" and "Someone I can always rely on is".  Lori Jensen expressed gratitude for the recent birth of her grandson and the love that she feels for him, seeing her daughter become a mother, and the ability to rely upon her best friend, who has supported her through recent challenges.  Lori Jensen reported that she would like to make this a habit, and stated "I think I could really benefit from it.  It made me realize how many positive things are in my life, and set aside the negativity for once".    Assessment and Plan: Lori Jensen has completed [facility] and will be discharged.  She reported that she will followup with her primary therapist and psychiatrist. Counselor recommends adherence to crisis/safety plan, taking medications as prescribed, and following up with medical professionals if any issues arise.   Follow Up Instructions: Lori Jensen was advised to call back or seek an in-person evaluation if the symptoms worsen or if the condition fails to improve as anticipated.   Collaboration of Care:   Medication Management AEB Dr. Armando Reichert or Ricky Ala, NP                                           Case Manager AEB Dellia Nims, CNA    Patient/Guardian was advised Release of Information must be obtained prior to any record release in order to collaborate their care with an outside provider. Patient/Guardian was advised if they have not already done so to contact the registration department to sign all necessary forms in order for [place] to release information regarding their care.   Consent: Patient/Guardian gives verbal consent for treatment and assignment of benefits for services provided during this visit. Patient/Guardian expressed understanding and agreed to proceed.  I provided 180 minutes of non-face-to-face  time during this encounter.   Shade Flood, LCSW, LCAS 04/23/22

## 2022-04-23 NOTE — Progress Notes (Signed)
Virtual Visit via Video Note  I connected with Lori Jensen on '@TODAY'$ @ at  9:00 AM EDT by a video enabled telemedicine application and verified that I am speaking with the correct person using two identifiers.  Location: Patient: at home Provider: at home office   I discussed the limitations of evaluation and management by telemedicine and the availability of in person appointments. The patient expressed understanding and agreed to proceed.  I discussed the assessment and treatment plan with the patient. The patient was provided an opportunity to ask questions and all were answered. The patient agreed with the plan and demonstrated an understanding of the instructions.   The patient was advised to call back or seek an in-person evaluation if the symptoms worsen or if the condition fails to improve as anticipated.  I provided 20 minutes of non-face-to-face time during this encounter.   Carlis Abbott, RITA, M.Ed, CNA   Patient ID: Lori Jensen, female   DOB: [birth year], 51 y.o.   MRN: [id number] D: As per previous CCA states:  Pt presents as self-referral known to this writer due to past admissions in this agency's PHP and IOP. Pt reports worsening depression and anxiety( esp. anxiety and daily panic attacks).  "I worry about everything.  It's like my brain won't stop worrying."  Stressors:  1) pregnant daughter moved to [place] about 1 1/2 yrs ago.  She is due Aug. 7th.  "I'm panicking about the flight, my daughter doesn't have a carseat for the baby yet, etc. I worry about the relationship my son is currently in."  2) Pt granted disability in 2020 d/t chronic pain and HTN and all her mental illnesses; so she is no longer employed.  Past psychiatric admit was in 2019 at [facility].  Pt has been seeing Dr. Doyne Keel for years and Morley Kos, LCSW, LCAS for 1 1/2 yrs on an outpt basis.  Family hx:  ETOH (deceased brother) and Depression (mother).   Current Symptoms/Problems: Crying spells,  isolated, no energy, increased appetite and weight gain, decreased sleep, poor concentration, no motivation, poor self-esteem, ruminating/racing thoughts, anhedonia, irritable, hopeless, helplessness, anxiety and daily panic attacks  Pt completed all fifteen [facility]-I[facility] days.  Reports she feels like she has learned a lot of tools and the groups were very helpful.  "I'm worried about not having any structure for these three hours in the morning."  On a scale of 1-10 (10 being the worst), pt rated her anxiety at a #8 and depression at a #4.  Denies SI/HI or A/V hallucinations.  Pt states her new born grandson was brought home from the hospital.  Pt will be flying out on Mon. 04-26-22 to go to [place] for a couple of weeks, to assist her daughter.  A:  D/C today.  F/U with Dr. Doyne Keel on 06-03-22, Morley Kos, LCSW on 04-27-22. Pt was advised of ROI must be obtained prior to any records release in order to collaborate her care with an outside provider.  Pt was advised if she has not already done so to contact the front desk to sign all necessary forms in order for [facility] to release info re: her care.    Consent:  Pt gives verbal consent for tx and assignment of benefits for services provided during this telehealth group process.  Pt expressed understanding and agreed to proceed. Collaboration of care:  Collaborate with Dr. Wilmon Arms and Shade Flood, LCSW AEB, Dr. Danelle Earthly, and Morley Kos, LCSW, LCAS AEB. Strongly encouraged  support groups thru [employer] and/ [facility] [employer] R:  Pt receptive.  Dellia Nims, M.Ed,CNA

## 2022-04-28 ENCOUNTER — Other Ambulatory Visit (HOSPITAL_COMMUNITY): Payer: Self-pay | Admitting: *Deleted

## 2022-04-28 DIAGNOSIS — F4312 Post-traumatic stress disorder, chronic: Secondary | ICD-10-CM

## 2022-04-28 DIAGNOSIS — F3181 Bipolar II disorder: Secondary | ICD-10-CM

## 2022-04-28 MED ORDER — GABAPENTIN 400 MG PO CAPS: 400.0000 mg | ORAL_CAPSULE | Freq: Every day | ORAL | 3 refills | Status: DC

## 2022-04-28 MED ORDER — BUPROPION HCL ER (XL) 150 MG PO TB24: 450.0000 mg | ORAL_TABLET | ORAL | 0 refills | Status: DC

## 2022-06-03 ENCOUNTER — Telehealth (HOSPITAL_BASED_OUTPATIENT_CLINIC_OR_DEPARTMENT_OTHER): Payer: Medicare (Managed Care) | Admitting: Psychiatry

## 2022-06-03 ENCOUNTER — Encounter (HOSPITAL_COMMUNITY): Payer: Self-pay | Admitting: Psychiatry

## 2022-06-03 DIAGNOSIS — F5105 Insomnia due to other mental disorder: Secondary | ICD-10-CM

## 2022-06-03 DIAGNOSIS — F3181 Bipolar II disorder: Secondary | ICD-10-CM

## 2022-06-03 DIAGNOSIS — F99 Mental disorder, not otherwise specified: Secondary | ICD-10-CM | POA: Diagnosis not present

## 2022-06-03 DIAGNOSIS — F4312 Post-traumatic stress disorder, chronic: Secondary | ICD-10-CM | POA: Diagnosis not present

## 2022-06-03 MED ORDER — DIVALPROEX SODIUM ER 500 MG PO TB24: 500.0000 mg | ORAL_TABLET | Freq: Every day | ORAL | 0 refills | Status: DC

## 2022-06-03 MED ORDER — BUPROPION HCL ER (XL) 150 MG PO TB24: 450.0000 mg | ORAL_TABLET | ORAL | 0 refills | Status: DC

## 2022-06-03 MED ORDER — CLONAZEPAM 1 MG PO TABS: 1.0000 mg | ORAL_TABLET | Freq: Two times a day (BID) | ORAL | 0 refills | Status: DC | PRN

## 2022-06-03 NOTE — Progress Notes (Signed)
Virtual Visit via Video Note  I connected with Lori Jensen on 06/03/22 at  1:30 PM EDT by   a video enabled telemedicine application and verified that I am speaking with the correct person using two identifiers.  Location: Patient: home Provider: office   I discussed the limitations of evaluation and management by telemedicine and the availability of in person appointments. The patient expressed understanding and agreed to proceed.  History of Present Illness: Lori Jensen visited her daughter and [place] in [place] for 8 days. It was a good trip. Lori Jensen shares her anxiety remains high. She is also very depressed. Over the last couple of weeks she sleeps most days and at night her sleep is broken. She is not taking trazodone or Topamax. Her energy is low. She has missed dose of meds because she is sleeping so much. She has no motivation to do anything around the house. Her appetite is poor and she is eating randomly Her weight plays a part in her negative outlook.  She has been experiencing worsening feelings of worthlessness. Lori Jensen is going to start going to the weight loss clinic. Her focus is poor. Lori Jensen has frequent crying spells. She is endorsing passive thoughts of death once every few days. She denies SI/HI. She admits that she has several stressors that could be contributing. She has restarted individual therapy. Lori Jensen denies hypomanic or manic like symptoms or episodes. At times she gets restless and doesn't know what to do. Her PTSD is ongoing. She is having more frequent bad dreams and nightmares. She denies hypervigilance and avoidance. She is having some intrusive memories.    Observations/Objective: Psychiatric Specialty Exam: ROS  There were no vitals taken for this visit.There is no height or weight on file to calculate BMI.  General Appearance: Casual  Eye Contact:  Good  Speech:  Clear and Coherent and Normal Rate  Volume:  Normal  Mood:  Depressed  Affect:  Congruent,  Depressed, and Tearful  Thought Process:  Goal Directed, Linear, and Descriptions of Associations: Intact  Orientation:  Full (Time, Place, and Person)  Thought Content:  Logical  Suicidal Thoughts:  No  Homicidal Thoughts:  No  Memory:  Immediate;   Good  Judgement:  Good  Insight:  Good  Psychomotor Activity:  Normal  Concentration:  Concentration: Good  Recall:  Good  Fund of Knowledge:  Good  Language:  Good  Akathisia:  No  Handed:  Right  AIMS (if indicated):     Assets:  Communication Skills Desire for Improvement Financial Resources/Insurance Housing Intimacy Leisure Time Resilience Social Support Talents/Skills Transportation Vocational/Educational  ADL's:  Intact  Cognition:  WNL  Sleep:        Assessment and Plan:     06/03/2022    1:34 PM 04/08/2022    1:29 PM 03/17/2022    1:30 PM 11/19/2021    2:08 PM 10/01/2021    9:32 AM  Depression screen PHQ 2/9  Decreased Interest '3 3 2 1 1  '$ Down, Depressed, Hopeless '3 1 2 2 1  '$ PHQ - 2 Score '6 4 4 3 2  '$ Altered sleeping '3 3 3 1 '$ 0  Tired, decreased energy '3 3 3 '$ 0 3  Change in appetite '3 3 3 '$ 0 0  Feeling bad or failure about yourself  '3 3 3 1 2  '$ Trouble concentrating '3 3 3 3 3  '$ Moving slowly or fidgety/restless 0 0 2 0 0  Suicidal thoughts 2 0 1 0 0  PHQ-9 Score  $'23 19 22 8 10  'B$ Difficult doing work/chores Extremely dIfficult Very difficult Very difficult Very difficult Very difficult    Flowsheet Row Video Visit from 06/03/2022 in [facility] Video Visit from 04/08/2022 in [facility] Counselor from 03/17/2022 in [facility] Error: Q3, 4, or 5 should not be populated when Q2 is No Error: Q3, 4, or 5 should not be populated when Q2 is No Error: Question 6 not populated         Pt is aware that these meds carry a teratogenic risk. Pt will discuss plan of action if she does or plans to become  pregnant in the future.  Status of current problems: worsening depression symptoms  Meds: d/c Abilify Stop Trazodone as pt is not taking it Start Depakote  Continue Gabapentin '400mg'$  po qHS 1. Bipolar 2 disorder (HCC) - buPROPion (WELLBUTRIN XL) 150 MG 24 hr tablet; Take 3 tablets (450 mg total) by mouth every morning.  Dispense: 270 tablet; Refill: 0  2. Chronic post-traumatic stress disorder (PTSD) - clonazePAM (KLONOPIN) 1 MG tablet; Take 1 tablet (1 mg total) by mouth 2 (two) times daily as needed for anxiety.  Dispense: 60 tablet; Refill: 0  3. Insomnia due to other mental disorder     Labs: order labs at next visit    Therapy: brief supportive therapy provided. Discussed psychosocial stressors in detail.     Collaboration of Care: Referral or follow-up with counselor/therapist AEB follow up with therapist  Patient/Guardian was advised Release of Information must be obtained prior to any record release in order to collaborate their care with an outside provider. Patient/Guardian was advised if they have not already done so to contact the registration department to sign all necessary forms in order for [place] to release information regarding their care.   Consent: Patient/Guardian gives verbal consent for treatment and assignment of benefits for services provided during this visit. Patient/Guardian expressed understanding and agreed to proceed.      Follow Up Instructions: Follow up in 2 weeks or sooner if needed    I discussed the assessment and treatment plan with the patient. The patient was provided an opportunity to ask questions and all were answered. The patient agreed with the plan and demonstrated an understanding of the instructions.   The patient was advised to call back or seek an in-person evaluation if the symptoms worsen or if the condition fails to improve as anticipated.  I provided 22 minutes of non-face-to-face time during this encounter.   [name], MD

## 2022-06-17 ENCOUNTER — Telehealth (HOSPITAL_BASED_OUTPATIENT_CLINIC_OR_DEPARTMENT_OTHER): Payer: Medicare (Managed Care) | Admitting: Psychiatry

## 2022-06-17 DIAGNOSIS — F3181 Bipolar II disorder: Secondary | ICD-10-CM | POA: Diagnosis not present

## 2022-06-17 DIAGNOSIS — F5105 Insomnia due to other mental disorder: Secondary | ICD-10-CM

## 2022-06-17 DIAGNOSIS — F99 Mental disorder, not otherwise specified: Secondary | ICD-10-CM

## 2022-06-17 MED ORDER — DIVALPROEX SODIUM ER 500 MG PO TB24: 500.0000 mg | ORAL_TABLET | Freq: Every day | ORAL | 0 refills | Status: DC

## 2022-06-17 NOTE — Progress Notes (Signed)
Virtual Visit via Video Note  I connected with Lori Jensen on 06/17/22 at 10:30 AM EDT by   a video enabled telemedicine application and verified that I am speaking with the correct person using two identifiers.  Location: Patient: home Provider: office   I discussed the limitations of evaluation and management by telemedicine and the availability of in person appointments. The patient expressed understanding and agreed to proceed.  History of Present Illness: I last saw Lori Jensen 2 weeks ago and started Depakote at the time. She has been taking Depakote for about 12 days now. She is not sure if she is having side effects. She is feeling SOB and thinks it might be anxiety. She is  also losing a lot of hair. She is also not sleeping at night. She was awake and feeling scared by every noise. Lori Jensen was sleeping a few hours in the daytime. She was taking Depakote in the evening but  switched the pill to AM  yesterday. Last night she slept 4.5 hrs. Lori Jensen feels like restless but has no energy to do anything. She is "feeling closed in my own skin". Her depression is improved. She is not wanting to sleep all the time and is not feeling as depressed. She denies SI/HI. She doesn't feel like she exists anywhere. Lori Jensen denies any impulsive behaviors. The anger and irritability are a little better. Lori Jensen would like to continue the Depakote for now    Observations/Objective: Psychiatric Specialty Exam: ROS  There were no vitals taken for this visit.There is no height or weight on file to calculate BMI.  General Appearance: Casual  Eye Contact:  Good  Speech:  Clear and Coherent and Normal Rate  Volume:  Normal  Mood:  Anxious and Depressed  Affect:  Full Range and much calmer and brighter than at previous visit  Thought Process:  Goal Directed, Linear, and Descriptions of Associations: Intact  Orientation:  Full (Time, Place, and Person)  Thought Content:  Logical  Suicidal Thoughts:  No   Homicidal Thoughts:  No  Memory:  Immediate;   Good  Judgement:  Good  Insight:  Good  Psychomotor Activity:  Normal  Concentration:  Concentration: Good  Recall:  Good  Fund of Knowledge:  Good  Language:  Good  Akathisia:  No  Handed:  Right  AIMS (if indicated):     Assets:  Communication Skills Desire for Improvement Financial Resources/Insurance Housing Intimacy Leisure Time Resilience Social Support Talents/Skills Vocational/Educational  ADL's:  Intact  Cognition:  WNL  Sleep:        Assessment and Plan:     06/03/2022    1:34 PM 04/08/2022    1:29 PM 03/17/2022    1:30 PM 11/19/2021    2:08 PM 10/01/2021    9:32 AM  Depression screen PHQ 2/9  Decreased Interest '3 3 2 1 1  '$ Down, Depressed, Hopeless '3 1 2 2 1  '$ PHQ - 2 Score '6 4 4 3 2  '$ Altered sleeping '3 3 3 1 '$ 0  Tired, decreased energy '3 3 3 '$ 0 3  Change in appetite '3 3 3 '$ 0 0  Feeling bad or failure about yourself  '3 3 3 1 2  '$ Trouble concentrating '3 3 3 3 3  '$ Moving slowly or fidgety/restless 0 0 2 0 0  Suicidal thoughts 2 0 1 0 0  PHQ-9 Score '23 19 22 8 10  '$ Difficult doing work/chores Extremely dIfficult Very difficult Very difficult Very difficult Very difficult  Flowsheet Row Video Visit from 06/03/2022 in [facility] Video Visit from 04/08/2022 in [facility] Counselor from 03/17/2022 in [place] Error: Q3, 4, or 5 should not be populated when Q2 is No Error: Q3, 4, or 5 should not be populated when Q2 is No Error: Question 6 not populated       Pt is aware that these meds carry a teratogenic risk. Pt will discuss plan of action if she does or plans to become pregnant in the future.  Status of current problems: mild improvement in depression and irritability. She wants to continue Depakote to see if she has further benefit. The side effect of hair loss and insomnia are things she is  going to work on.   Meds: Continue Wellbutrin XL '450mg'$  po qD Klonopon '1mg'$  BID prn anxiety Gabapentin '400mg'$  po qHS Recommended restart Trazodone '150mg'$  po qHS prn insomnia. No refill today as she has enough at home.  1. Bipolar 2 disorder (HCC) - divalproex (DEPAKOTE ER) 500 MG 24 hr tablet; Take 1 tablet (500 mg total) by mouth daily.  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     Collaboration of Care: Other none  Patient/Guardian was advised Release of Information must be obtained prior to any record release in order to collaborate their care with an outside provider. Patient/Guardian was advised if they have not already done so to contact the registration department to sign all necessary forms in order for Lori Jensen to release information regarding their care.   Consent: Patient/Guardian gives verbal consent for treatment and assignment of benefits for services provided during this visit. Patient/Guardian expressed understanding and agreed to proceed.      Follow Up Instructions: Follow up in 2-3 weeks or sooner if needed    I discussed the assessment and treatment plan with the patient. The patient was provided an opportunity to ask questions and all were answered. The patient agreed with the plan and demonstrated an understanding of the instructions.   The patient was advised to call back or seek an in-person evaluation if the symptoms worsen or if the condition fails to improve as anticipated.  I provided 14 minutes of non-face-to-face time during this encounter.   [name], MD

## 2022-07-01 ENCOUNTER — Telehealth (HOSPITAL_COMMUNITY): Payer: Self-pay

## 2022-07-01 NOTE — Telephone Encounter (Signed)
Patient called with concerns about her Depakote she was having trouble sleeping she started taking her Depakote at night but was still having problems with sleeping she is now experiencing shaking in her hands, and arms this has been going on for about 4-5 days she also mentioned she has been losing some hair she is requesting to stop taking the medication she is wanting to know if she needs to titrate she will wait until her next appt to discuss what other options

## 2022-07-02 NOTE — Telephone Encounter (Signed)
Made patient aware that she could just stop the total dose she voiced understanding

## 2022-07-08 ENCOUNTER — Telehealth (HOSPITAL_BASED_OUTPATIENT_CLINIC_OR_DEPARTMENT_OTHER): Payer: Medicare (Managed Care) | Admitting: Psychiatry

## 2022-07-08 DIAGNOSIS — F99 Mental disorder, not otherwise specified: Secondary | ICD-10-CM

## 2022-07-08 DIAGNOSIS — F5105 Insomnia due to other mental disorder: Secondary | ICD-10-CM | POA: Diagnosis not present

## 2022-07-08 DIAGNOSIS — F4312 Post-traumatic stress disorder, chronic: Secondary | ICD-10-CM | POA: Diagnosis not present

## 2022-07-08 DIAGNOSIS — F3181 Bipolar II disorder: Secondary | ICD-10-CM | POA: Diagnosis not present

## 2022-07-08 MED ORDER — LURASIDONE HCL 20 MG PO TABS: 20.0000 mg | ORAL_TABLET | Freq: Every day | ORAL | 0 refills | Status: DC

## 2022-07-08 MED ORDER — CLONAZEPAM 1 MG PO TABS: 1.0000 mg | ORAL_TABLET | Freq: Two times a day (BID) | ORAL | 0 refills | Status: DC | PRN

## 2022-07-08 NOTE — Progress Notes (Signed)
Virtual Visit via Video Note  I connected with Lori Jensen on 07/08/22 at  2:30 PM EDT by   a video enabled telemedicine application and verified that I am speaking with the correct person using two identifiers.  Location: Patient: home Provider: office   I discussed the limitations of evaluation and management by telemedicine and the availability of in person appointments. The patient expressed understanding and agreed to proceed.  History of Present Illness: Lori Jensen reports she is now off Depakote for about 1 week now. She was unable to tolerate the SE of hair loss and insomnia. It helped her mood so she was sad to have to stop it. [name] has been binge eating due to sadness and stress. She has started a weight loss program but her insurance does not cover it. She is very worried about weight gain. [name] has no motivation and spends her time eating and watching tv. She is sleeping at night now that she is off Depakote. She has racing thoughts and it takes a while to calm her mind for her fall asleep. She is getting 6 hrs of sleep a night. She is having frequent crying spells. Pt denies recent manic and hypomanic symptoms including periods of decreased need for sleep, increased energy, mood lability, impulsivity, FOI, and excessive spending. Trace denies SI/HI. She often wonders about her purpose and why she is here. At night she is having significant hypervigilance.     Observations/Objective: Psychiatric Specialty Exam: ROS  There were no vitals taken for this visit.There is no height or weight on file to calculate BMI.  General Appearance: Casual  Eye Contact:  Good  Speech:  Clear and Coherent and Normal Rate  Volume:  Normal  Mood:  Anxious and Depressed  Affect:  Congruent and Tearful  Thought Process:  Goal Directed, Linear, and Descriptions of Associations: Intact  Orientation:  Full (Time, Place, and Person)  Thought Content:  Logical  Suicidal Thoughts:  No  Homicidal  Thoughts:  No  Memory:  Immediate;   Good  Judgement:  Good  Insight:  Good  Psychomotor Activity:  Normal  Concentration:  Concentration: Good  Recall:  Good  Fund of Knowledge:  Good  Language:  Good  Akathisia:  No  Handed:  Right  AIMS (if indicated):     Assets:  Communication Skills Desire for Improvement Financial Resources/Insurance Housing Resilience Social Support Talents/Skills Transportation Vocational/Educational  ADL's:  Intact  Cognition:  WNL  Sleep:        Assessment and Plan:     06/03/2022    1:34 PM 04/08/2022    1:29 PM 03/17/2022    1:30 PM 11/19/2021    2:08 PM 10/01/2021    9:32 AM  Depression screen PHQ 2/9  Decreased Interest '3 3 2 1 1  '$ Down, Depressed, Hopeless '3 1 2 2 1  '$ PHQ - 2 Score '6 4 4 3 2  '$ Altered sleeping '3 3 3 1 '$ 0  Tired, decreased energy '3 3 3 '$ 0 3  Change in appetite '3 3 3 '$ 0 0  Feeling bad or failure about yourself  '3 3 3 1 2  '$ Trouble concentrating '3 3 3 3 3  '$ Moving slowly or fidgety/restless 0 0 2 0 0  Suicidal thoughts 2 0 1 0 0  PHQ-9 Score '23 19 22 8 10  '$ Difficult doing work/chores Extremely dIfficult Very difficult Very difficult Very difficult Very difficult    Flowsheet Row Video Visit from 06/03/2022 in [facility] Video Visit from 04/08/2022 in [facility] Counselor from 03/17/2022 in [place] Error: Q3, 4, or 5 should not be populated when Q2 is No Error: Q3, 4, or 5 should not be populated when Q2 is No Error: Question 6 not populated        Pt is aware that these meds carry a teratogenic risk. Pt will discuss plan of action if she does or plans to become pregnant in the future.  Status of current problems: worsening depression symptoms  Meds: start trial of Latuda '20mg'$  po qD  Continue Wellbutrin XL '450mg'$  po qD Gabapentin '400mg'$  po qHS Trazodone '150mg'$  po qHS prn insomnia 1. Bipolar 2  disorder (HCC) - lurasidone (LATUDA) 20 MG TABS tablet; Take 1 tablet (20 mg total) by mouth daily in the afternoon.  Dispense: 30 tablet; Refill: 0  2. Chronic post-traumatic stress disorder (PTSD) - clonazePAM (KLONOPIN) 1 MG tablet; Take 1 tablet (1 mg total) by mouth 2 (two) times daily as needed for anxiety.  Dispense: 60 tablet; Refill: 0  3. Insomnia due to other mental disorder     Labs: reviewed labs    Therapy: brief supportive therapy provided. Discussed psychosocial stressors in detail.      Collaboration of Care: Other none  Patient/Guardian was advised Release of Information must be obtained prior to any record release in order to collaborate their care with an outside provider. Patient/Guardian was advised if they have not already done so to contact the registration department to sign all necessary forms in order for [place] to release information regarding their care.   Consent: Patient/Guardian gives verbal consent for treatment and assignment of benefits for services provided during this visit. Patient/Guardian expressed understanding and agreed to proceed.      Follow Up Instructions: Follow up in 1-2 weeks  or sooner if needed    I discussed the assessment and treatment plan with the patient. The patient was provided an opportunity to ask questions and all were answered. The patient agreed with the plan and demonstrated an understanding of the instructions.   The patient was advised to call back or seek an in-person evaluation if the symptoms worsen or if the condition fails to improve as anticipated.  I provided 21 minutes of non-face-to-face time during this encounter.   [name], MD

## 2022-07-22 ENCOUNTER — Telehealth (HOSPITAL_COMMUNITY): Payer: Medicare (Managed Care) | Admitting: Psychiatry

## 2022-07-27 ENCOUNTER — Other Ambulatory Visit: Payer: Self-pay

## 2022-07-27 ENCOUNTER — Emergency Department (HOSPITAL_BASED_OUTPATIENT_CLINIC_OR_DEPARTMENT_OTHER)
Admission: EM | Admit: 2022-07-27 | Discharge: 2022-07-27 | Disposition: A | Discharge status: Home / Self Care | Payer: Medicare (Managed Care) | Attending: Emergency Medicine | Admitting: Emergency Medicine

## 2022-07-27 ENCOUNTER — Encounter (HOSPITAL_BASED_OUTPATIENT_CLINIC_OR_DEPARTMENT_OTHER): Payer: Self-pay

## 2022-07-27 ENCOUNTER — Emergency Department (HOSPITAL_BASED_OUTPATIENT_CLINIC_OR_DEPARTMENT_OTHER): Payer: Medicare (Managed Care)

## 2022-07-27 DIAGNOSIS — R42 Dizziness and giddiness: Secondary | ICD-10-CM | POA: Diagnosis present

## 2022-07-27 DIAGNOSIS — R0602 Shortness of breath: Secondary | ICD-10-CM | POA: Insufficient documentation

## 2022-07-27 DIAGNOSIS — E876 Hypokalemia: Secondary | ICD-10-CM | POA: Diagnosis not present

## 2022-07-27 DIAGNOSIS — Z20822 Contact with and (suspected) exposure to covid-19: Secondary | ICD-10-CM | POA: Insufficient documentation

## 2022-07-27 DIAGNOSIS — I10 Essential (primary) hypertension: Secondary | ICD-10-CM | POA: Insufficient documentation

## 2022-07-27 LAB — MAGNESIUM: Magnesium: 1.8 mg/dL (ref 1.7–2.4)

## 2022-07-27 LAB — TROPONIN I (HIGH SENSITIVITY): Troponin I (High Sensitivity): 2 ng/L (ref <18)

## 2022-07-27 LAB — BASIC METABOLIC PANEL
Anion gap: 7 (ref 5–15)
BUN: 10 mg/dL (ref 6–20)
CO2: 25 mmol/L (ref 22–32)
Calcium: 8.6 mg/dL — ABNORMAL LOW (ref 8.9–10.3)
Chloride: 108 mmol/L (ref 98–111)
Creatinine, Ser: 0.79 mg/dL (ref 0.44–1.00)
GFR, Estimated: >60 mL/min (ref >60)
Glucose, Bld: 96 mg/dL (ref 70–99)
Potassium: 3.4 mmol/L — ABNORMAL LOW (ref 3.5–5.1)
Sodium: 140 mmol/L (ref 135–145)

## 2022-07-27 LAB — CBC
HCT: 41.6 % (ref 36.0–46.0)
Hemoglobin: 14.5 g/dL (ref 12.0–15.0)
MCH: 28.8 pg (ref 26.0–34.0)
MCHC: 34.9 g/dL (ref 30.0–36.0)
MCV: 82.5 fL (ref 80.0–100.0)
Platelets: 284 10*3/uL (ref 150–400)
RBC: 5.04 MIL/uL (ref 3.87–5.11)
RDW: 13.4 % (ref 11.5–15.5)
WBC: 7.5 10*3/uL (ref 4.0–10.5)
nRBC: 0 % (ref 0.0–0.2)

## 2022-07-27 LAB — PREGNANCY, URINE: Preg Test, Ur: NEGATIVE

## 2022-07-27 LAB — SARS CORONAVIRUS 2 BY RT PCR: SARS Coronavirus 2 by RT PCR: NEGATIVE

## 2022-07-27 MED ORDER — POTASSIUM CHLORIDE ER 10 MEQ PO TBCR: 10.0000 meq | EXTENDED_RELEASE_TABLET | Freq: Every day | ORAL | 0 refills | Status: DC

## 2022-07-27 NOTE — Discharge Instructions (Addendum)
Please follow-up with your COVID test tomorrow morning.  Your primary care clinic can try to arrange for follow-up appointment this week if possible.  You should see how you are feeling later this week.  Continue drinking plenty of water at home.  You will start taking potassium supplement tablets as prescribed in the morning, to make up for your low potassium.  If you begin experiencing worsening chest pain, pressure, difficulty breathing, feel like passing out, or any strokelike symptoms, or other emergency medical concerns, please return to the ER

## 2022-07-27 NOTE — ED Triage Notes (Signed)
C/o dizziness, shortness of breath, left neck pain since this morning, worsening as day progressed. States has been hypertensive today. On antihypertensives, taking as prescribed.

## 2022-07-27 NOTE — ED Provider Notes (Signed)
[place] [facility] DE[facility] Provider Note   CSN: [id number] Arrival date & time: 07/27/22  1744     History  Chief Complaint  Patient presents with   Dizziness   Hypertension    Lori Jensen is a 51 y.o. female with history of hypertension presenting emerged department episode of dizziness and high blood pressure.  Patient reports that she has been feeling lightheaded, short of breath.  She reports had a sore throat a few days ago.  She denies headache, cough, congestion, diarrhea.  She reports she has been feeling lightheaded with standing up today.  She has not had chest pressure.  She is being evaluated as an outpatient by cardiology for potential cardiac disease, and had a stress test performed at [facility], which per my review of medical records, was read as no inducible ischemic event.  She says she has a full coronary CAT scan scheduled.  She also suffers from anxiety and takes anxiety medications.  HPI     Home Medications Prior to Admission medications   Medication Sig Start Date End Date Taking? Authorizing Provider  potassium chloride (KLOR-CON) 10 MEQ tablet Take 1 tablet (10 mEq total) by mouth daily for 30 doses. 07/27/22 08/26/22 Yes Wyvonnia Dusky, MD  buPROPion (WELLBUTRIN XL) 150 MG 24 hr tablet Take 3 tablets (450 mg total) by mouth every morning. 06/03/22 06/03/23  [name], MD  clonazePAM (KLONOPIN) 1 MG tablet Take 1 tablet (1 mg total) by mouth 2 (two) times daily as needed for anxiety. 07/08/22   [name], MD  cyclobenzaprine (FLEXERIL) 5 MG tablet Take 5 mg by mouth 2 (two) times daily. 02/01/18   [provider]  fosinopril-hydrochlorothiazide (MONOPRIL-HCT) 20-12.5 MG tablet Take 1 tablet by mouth daily.    [provider]  gabapentin (NEURONTIN) 400 MG capsule Take 1 capsule (400 mg total) by mouth at bedtime. 04/28/22 04/28/23  [name], MD  lurasidone (LATUDA) 20 MG TABS tablet Take 1 tablet (20 mg total)  by mouth daily in the afternoon. 07/08/22   [name], MD  pantoprazole (PROTONIX) 20 MG tablet Take 1 tablet (20 mg total) by mouth 2 (two) times daily. 09/19/21   Lacretia Leigh, MD  PROAIR HFA [phone] Base) MCG/ACT inhaler Inhale 1-2 puffs into the lungs every 6 (six) hours as needed for wheezing or shortness of breath. 03/11/18   [provider]  traZODone (DESYREL) 150 MG tablet Take 1 tablet (150 mg total) by mouth at bedtime. Patient not taking: Reported on 04/08/2022 09/03/21   [name], MD      Allergies    Codeine, Amoxicillin, Doxycycline, and Lamictal [lamotrigine]    Review of Systems   Review of Systems  Physical Exam Updated Vital Signs BP (!) 123/93   Pulse 66   Temp 98.5 F (36.9 C) (Oral)   Resp 14   Ht '5\' 5"'$  (1.651 m)   Wt 97.5 kg   SpO2 98%   BMI 35.78 kg/m  Physical Exam Constitutional:      General: She is not in acute distress. HENT:     Head: Normocephalic and atraumatic.  Eyes:     Conjunctiva/sclera: Conjunctivae normal.     Pupils: Pupils are equal, round, and reactive to light.  Cardiovascular:     Rate and Rhythm: Normal rate and regular rhythm.  Pulmonary:     Effort: Pulmonary effort is normal. No respiratory distress.  Abdominal:     General: There is no distension.  Tenderness: There is no abdominal tenderness.  Skin:    General: Skin is warm and dry.  Neurological:     General: No focal deficit present.     Mental Status: She is alert. Mental status is at baseline.  Psychiatric:        Mood and Affect: Mood normal.        Behavior: Behavior normal.     ED Results / Procedures / Treatments   Labs (all labs ordered are listed, but only abnormal results are displayed) Labs Reviewed  BASIC METABOLIC PANEL - Abnormal; Notable for the following components:      Result Value   Potassium 3.4 (*)    Calcium 8.6 (*)    All other components within normal limits  SARS CORONAVIRUS 2 BY RT PCR  CBC  PREGNANCY,  URINE  MAGNESIUM  TROPONIN I (HIGH SENSITIVITY)    EKG EKG Interpretation  Date/Time:  Tuesday July 27 2022 18:03:41 EDT Ventricular Rate:  74 PR Interval:  138 QRS Duration: 74 QT Interval:  404 QTC Calculation: 448 R Axis:   62 Text Interpretation: Normal sinus rhythm Normal ECG When compared with ECG of 19-Sep-2021 08:40, PREVIOUS ECG IS PRESENT No sig changes Confirmed by Octaviano Glow [phone]) on 07/27/2022 6:28:56 PM  Radiology DG Chest 2 View  Result Date: 07/27/2022 CLINICAL DATA:  Shortness of breath.  Dizziness. EXAM: CHEST - 2 VIEW COMPARISON:  09/19/2021 FINDINGS: The cardiomediastinal contours are normal. The lungs are clear. Pulmonary vasculature is normal. No consolidation, pleural effusion, or pneumothorax. No acute osseous abnormalities are seen. IMPRESSION: Negative radiographs of the chest. Electronically Signed   By: Keith Rake M.D.   On: 07/27/2022 18:24    Procedures Procedures    Medications Ordered in ED Medications - No data to display  ED Course/ Medical Decision Making/ A&P                           Medical Decision Making Amount and/or Complexity of Data Reviewed Labs: ordered. Radiology: ordered.  Risk Prescription drug management.   This patient presents to the ED with concern for lightheadedness, shortness of breath. This involves an extensive number of treatment options, and is a complaint that carries with it a high risk of complications and morbidity.  The differential diagnosis includes viral illness versus electrolyte derangement versus atypical ACS versus pneumonia versus other  Co-morbidities that complicate the patient evaluation: Patient on thiazide diuretic at high risk for electrolyte derangements  External records from outside source obtained and reviewed including stress test report  I ordered and personally interpreted labs.  The pertinent results include: Very mild hypokalemia potassium 3.4.  No other significant  abnormalities in the patient's blood work.  Calcium is 8.6, only mildly low  I ordered imaging studies including x-ray of the chest I independently visualized and interpreted imaging which showed no focal infiltrate or pneumonia I agree with the radiologist interpretation  The patient was maintained on a cardiac monitor.  I personally viewed and interpreted the cardiac monitored which showed an underlying rhythm of: Normal sinus rhythm  Per my interpretation the patient's ECG shows normal sinus rhythm no acute ischemic findings or high-grade heart block   Test Considered: I have a lower clinical suspicion for acute PE in this setting.  Aside from age she is PERC negative.  No acute risk factors for PE.  After the interventions noted above, I reevaluated the patient and found that they have:  stayed the same  Her blood pressures improved while she was sitting in her bed.  She reports that she has a strong history of anxiety and this may be driving some of her blood pressure.  It is not clear what is the underlying cause of her other symptoms.  We will test her for COVID and add on a magnesium level.  However I do believe at this point she is clinically safe and stable for outpatient follow-up with the PCP.  I have a lower suspicion for ACS, CVA, aortic dissection, sepsis, or other life-threatening infection or clinical condition.  Dispostion:  After consideration of the diagnostic results and the patients response to treatment, I feel that the patent would benefit from outpatient follow-up         Final Clinical Impression(s) / ED Diagnoses Final diagnoses:  Lightheadedness  Hypokalemia    Rx / DC Orders ED Discharge Orders          Ordered    potassium chloride (KLOR-CON) 10 MEQ tablet  Daily        07/27/22 1933              Wyvonnia Dusky, MD 07/27/22 1943

## 2022-08-05 ENCOUNTER — Telehealth (HOSPITAL_BASED_OUTPATIENT_CLINIC_OR_DEPARTMENT_OTHER): Payer: Medicare (Managed Care) | Admitting: Psychiatry

## 2022-08-05 DIAGNOSIS — F3181 Bipolar II disorder: Secondary | ICD-10-CM

## 2022-08-05 DIAGNOSIS — F5105 Insomnia due to other mental disorder: Secondary | ICD-10-CM | POA: Diagnosis not present

## 2022-08-05 DIAGNOSIS — F99 Mental disorder, not otherwise specified: Secondary | ICD-10-CM | POA: Diagnosis not present

## 2022-08-05 DIAGNOSIS — F4312 Post-traumatic stress disorder, chronic: Secondary | ICD-10-CM

## 2022-08-05 MED ORDER — BUPROPION HCL ER (XL) 150 MG PO TB24: 450.0000 mg | ORAL_TABLET | ORAL | 0 refills | Status: DC

## 2022-08-05 MED ORDER — CLONAZEPAM 1 MG PO TABS: 1.0000 mg | ORAL_TABLET | Freq: Two times a day (BID) | ORAL | 1 refills | Status: DC | PRN

## 2022-08-05 MED ORDER — LURASIDONE HCL 20 MG PO TABS: 20.0000 mg | ORAL_TABLET | Freq: Every day | ORAL | 0 refills | Status: DC

## 2022-08-05 NOTE — Progress Notes (Signed)
Virtual Visit via Video Note  I connected with Lori Jensen on 08/05/22 at  1:00 PM EST by  a video enabled telemedicine application and verified that I am speaking with the correct person using two identifiers.  Location: Patient: home Provider: office   I discussed the limitations of evaluation and management by telemedicine and the availability of in person appointments. The patient expressed understanding and agreed to proceed.  History of Present Illness: Lori Jensen has been taking Latuda for about 7 days now. She was finally able to find it at an affordable price at [facility]. She notes she is having very vivid dreams that wakes up her up a few times each night. She is able to fall back asleep quickly after each time. She gets about 6 hrs most nights. Lori Jensen is tired during the day and ends up napping for 1-2 hrs/days. Lori Jensen has been having some trembling in her hands randomly every couple of days. She notices when she is trying to drink something. It has happened in the past but it seems more frequently now that she has started [place]. Her depression was very bad right before starting the [place]. She is still depressed but can tell the [name] is helping a little. She denies SI/HI. She denies any manic or hypomanic like symptoms or episodes. Her PTSD symptoms continue. She has ongoing hypervigilance and avoids crowds whenever possible. She wants to continue to take it.     Observations/Objective: Psychiatric Specialty Exam: ROS  There were no vitals taken for this visit.There is no height or weight on file to calculate BMI.  General Appearance: Casual and Neat  Eye Contact:  Good  Speech:  Clear and Coherent and Normal Rate  Volume:  Normal  Mood:  Depressed  Affect:  Full Range  Thought Process:  Goal Directed and Descriptions of Associations: Intact  Orientation:  Full (Time, Place, and Person)  Thought Content:  Logical  Suicidal Thoughts:  No  Homicidal Thoughts:  No  Memory:   Immediate;   Good  Judgement:  Good  Insight:  Good  Psychomotor Activity:  Normal  Concentration:  Concentration: Good  Recall:  Good  Fund of Knowledge:  Good  Language:  Good  Akathisia:  No  Handed:  Right  AIMS (if indicated):     Assets:  Communication Skills Desire for Improvement Financial Resources/Insurance Housing Resilience Social Support Talents/Skills Transportation Vocational/Educational  ADL's:  Intact  Cognition:  WNL  Sleep:        Assessment and Plan:     06/03/2022    1:34 PM 04/08/2022    1:29 PM 03/17/2022    1:30 PM 11/19/2021    2:08 PM 10/01/2021    9:32 AM  Depression screen PHQ 2/9  Decreased Interest '3 3 2 1 1  '$ Down, Depressed, Hopeless '3 1 2 2 1  '$ PHQ - 2 Score '6 4 4 3 2  '$ Altered sleeping '3 3 3 1 '$ 0  Tired, decreased energy '3 3 3 '$ 0 3  Change in appetite '3 3 3 '$ 0 0  Feeling bad or failure about yourself  '3 3 3 1 2  '$ Trouble concentrating '3 3 3 3 3  '$ Moving slowly or fidgety/restless 0 0 2 0 0  Suicidal thoughts 2 0 1 0 0  PHQ-9 Score '23 19 22 8 10  '$ Difficult doing work/chores Extremely dIfficult Very difficult Very difficult Very difficult Very difficult    Flowsheet Row ED from 07/27/2022 in [facility]  Visit from 06/03/2022 in [facility] Video Visit from 04/08/2022 in [facility]  C-SSRS RISK CATEGORY No Risk Error: Q3, 4, or 5 should not be populated when Q2 is No Error: Q3, 4, or 5 should not be populated when Q2 is No        Pt is aware that these meds carry a teratogenic risk. Pt will discuss plan of action if she does or plans to become pregnant in the future.  Status of current problems: tolerating the Latuda and wants to continue it.  Meds: Continue Gabapentin '400mg'$  po qHS 1. Bipolar 2 disorder (HCC) - buPROPion (WELLBUTRIN XL) 150 MG 24 hr tablet; Take 3 tablets (450 mg total) by mouth every morning.  Dispense: 270  tablet; Refill: 0 - lurasidone (LATUDA) 20 MG TABS tablet; Take 1 tablet (20 mg total) by mouth daily in the afternoon.  Dispense: 90 tablet; Refill: 0  2. Chronic post-traumatic stress disorder (PTSD) - clonazePAM (KLONOPIN) 1 MG tablet; Take 1 tablet (1 mg total) by mouth 2 (two) times daily as needed for anxiety.  Dispense: 60 tablet; Refill: 1  3. Insomnia due to other mental disorder     Labs: none    Therapy: brief supportive therapy provided.     Collaboration of Care: Other none  Patient/Guardian was advised Release of Information must be obtained prior to any record release in order to collaborate their care with an outside provider. Patient/Guardian was advised if they have not already done so to contact the registration department to sign all necessary forms in order for [place] to release information regarding their care.   Consent: Patient/Guardian gives verbal consent for treatment and assignment of benefits for services provided during this visit. Patient/Guardian expressed understanding and agreed to proceed.     Follow Up Instructions: Follow up in 2 months or sooner if needed    I discussed the assessment and treatment plan with the patient. The patient was provided an opportunity to ask questions and all were answered. The patient agreed with the plan and demonstrated an understanding of the instructions.   The patient was advised to call back or seek an in-person evaluation if the symptoms worsen or if the condition fails to improve as anticipated.  I provided 13 minutes of non-face-to-face time during this encounter.   [name], MD

## 2022-09-30 ENCOUNTER — Telehealth (HOSPITAL_BASED_OUTPATIENT_CLINIC_OR_DEPARTMENT_OTHER): Payer: Medicare HMO | Admitting: Psychiatry

## 2022-09-30 DIAGNOSIS — F5105 Insomnia due to other mental disorder: Secondary | ICD-10-CM

## 2022-09-30 DIAGNOSIS — F99 Mental disorder, not otherwise specified: Secondary | ICD-10-CM

## 2022-09-30 DIAGNOSIS — F4312 Post-traumatic stress disorder, chronic: Secondary | ICD-10-CM

## 2022-09-30 DIAGNOSIS — F3181 Bipolar II disorder: Secondary | ICD-10-CM | POA: Diagnosis not present

## 2022-09-30 MED ORDER — BUPROPION HCL ER (XL) 150 MG PO TB24: 450.0000 mg | ORAL_TABLET | ORAL | 0 refills | Status: DC

## 2022-09-30 MED ORDER — CLONAZEPAM 1 MG PO TABS: 1.0000 mg | ORAL_TABLET | Freq: Two times a day (BID) | ORAL | 1 refills | Status: DC | PRN

## 2022-09-30 MED ORDER — LURASIDONE HCL 40 MG PO TABS: 40.0000 mg | ORAL_TABLET | Freq: Every day | ORAL | 0 refills | Status: DC
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
## 2022-10-14 ENCOUNTER — Telehealth (HOSPITAL_BASED_OUTPATIENT_CLINIC_OR_DEPARTMENT_OTHER): Payer: Medicare HMO | Admitting: Psychiatry

## 2022-10-14 DIAGNOSIS — F4312 Post-traumatic stress disorder, chronic: Secondary | ICD-10-CM

## 2022-10-14 DIAGNOSIS — F3181 Bipolar II disorder: Secondary | ICD-10-CM | POA: Diagnosis not present

## 2022-10-14 MED ORDER — GABAPENTIN 400 MG PO CAPS: 400.0000 mg | ORAL_CAPSULE | Freq: Two times a day (BID) | ORAL | 0 refills | Status: DC

## 2022-10-14 NOTE — Progress Notes (Signed)
Virtual Visit via Video Note  I connected with Lori Jensen on 10/14/22 at  9:45 AM EST by  a video enabled telemedicine application and verified that I am speaking with the correct person using two identifiers.  Location: Patient: home Provider: office   I discussed the limitations of evaluation and management by telemedicine and the availability of in person appointments. The patient expressed understanding and agreed to proceed.  History of Present Illness: "I have been better". Lori Jensen is having a lot of pain and her PCP has diagnosed with fibromyalgia. The pain was severe but recently got a little better. It comes and goes and is diffuse all over the body. She just filled the Latuda '40mg'$  yesterday and has not started it yet. Her depression has been really bad but a lot of things going on now. She sometimes feels like giving up and is having some random, fleeting passive thoughts of death. The frequency is a little more because of her current stressors. Her sleep is disturbed and she is waking up a few times a night. She is able to fall back asleep most nights. [name] is having increased crying spells. [name] denies SI/HI. Pt denies recent manic and hypomanic symptoms including periods of decreased need for sleep, increased energy, mood lability, impulsivity, FOI, and excessive spending. Due to the stressors her anxiety is up. She is having hypervigilance and the desire to avoid going out but is fighting it. She is trying to challenge herself by doing it anyway.    Observations/Objective: Psychiatric Specialty Exam: ROS  There were no vitals taken for this visit.There is no height or weight on file to calculate BMI.  General Appearance: Casual  Eye Contact:  Good  Speech:  Clear and Coherent and Normal Rate  Volume:  Normal  Mood:  Depressed  Affect:  Congruent and Tearful  Thought Process:  Goal Directed, Linear, and Descriptions of Associations: Intact  Orientation:  Full (Time,  Place, and Person)  Thought Content:  Logical  Suicidal Thoughts:  No  Homicidal Thoughts:  No  Memory:  Immediate;   Good  Judgement:  Good  Insight:  Good  Psychomotor Activity:  Normal  Concentration:  Concentration: Good  Recall:  Good  Fund of Knowledge:  Good  Language:  Good  Akathisia:  No  Handed:  Right  AIMS (if indicated):     Assets:  Communication Skills Desire for Improvement Financial Resources/Insurance Housing Resilience Social Support Talents/Skills Transportation Vocational/Educational  ADL's:  Intact  Cognition:  WNL  Sleep:        Assessment and Plan:     09/30/2022    2:02 PM 06/03/2022    1:34 PM 04/08/2022    1:29 PM 03/17/2022    1:30 PM 11/19/2021    2:08 PM  Depression screen PHQ 2/9  Decreased Interest '3 3 3 2 1  '$ Down, Depressed, Hopeless '3 3 1 2 2  '$ PHQ - 2 Score '6 6 4 4 3  '$ Altered sleeping 0 '3 3 3 1  '$ Tired, decreased energy '3 3 3 3 '$ 0  Change in appetite '3 3 3 3 '$ 0  Feeling bad or failure about yourself  '3 3 3 3 1  '$ Trouble concentrating '3 3 3 3 3  '$ Moving slowly or fidgety/restless 0 0 0 2 0  Suicidal thoughts 2 2 0 1 0  PHQ-9 Score '20 23 19 22 8  '$ Difficult doing work/chores Very difficult Extremely dIfficult Very difficult Very difficult Very difficult  Flowsheet Row Video Visit from 09/30/2022 in [facility] from 07/27/2022 in [facility] Video Visit from 06/03/2022 in [facility]  C-SSRS RISK CATEGORY Error: Q3, 4, or 5 should not be populated when Q2 is No No Risk Error: Q3, 4, or 5 should not be populated when Q2 is No          Pt is aware that these meds carry a teratogenic risk. Pt will discuss plan of action if she does or plans to become pregnant in the future.  Status of current problems: ongoing depression and anxiety. She thinks she has a new diagnosis of fibromyalgia but I did not see that in review of her recent  PCP note.   She took Cymbalta in the past and states it was not helpful but at the time she did not have Fibromyalgia.    Medication management with supportive therapy. Risks and benefits, side effects and alternative treatment options discussed with patient. Pt was given an opportunity to ask questions about medication, illness, and treatment. All current psychiatric medications have been reviewed and discussed with the patient and adjusted as clinically appropriate.  Pt verbalized understanding and verbal consent obtained for treatment.  Meds: increase Latuda '40mg'$  po qD Continue Wellbutrin XL '450mg'$  po qD Klonopin '1mg'$  po BID prn anxiety- she is taking it every morning Trazodone- she is not taking it. No refills today Increase Gabapentin '400mg'$  po BID to target mood and anxiety symptoms- she has been taking it in the morning without fatigue 1. Bipolar 2 disorder (HCC) - gabapentin (NEURONTIN) 400 MG capsule; Take 1 capsule (400 mg total) by mouth 2 (two) times daily.  Dispense: 180 capsule; Refill: 0  2. Chronic post-traumatic stress disorder (PTSD) - gabapentin (NEURONTIN) 400 MG capsule; Take 1 capsule (400 mg total) by mouth 2 (two) times daily.  Dispense: 180 capsule; Refill: 0   I reviewed note from PCP on care everywhere for visit on 10/04/2022. It states that symptoms are not consistent with any disease process. She is doing a further workup for diffuse pain and recommends she continue to follow up with psychiatry.     Therapy: brief supportive therapy provided. Discussed psychosocial stressors in detail.      Collaboration of Care: Other none  Patient/Guardian was advised Release of Information must be obtained prior to any record release in order to collaborate their care with an outside provider. Patient/Guardian was advised if they have not already done so to contact the registration department to sign all necessary forms in order for [place] to release information regarding their care.    Consent: Patient/Guardian gives verbal consent for treatment and assignment of benefits for services provided during this visit. Patient/Guardian expressed understanding and agreed to proceed.      Follow Up Instructions: Follow up in 2-3 weeks or sooner if needed    I discussed the assessment and treatment plan with the patient. The patient was provided an opportunity to ask questions and all were answered. The patient agreed with the plan and demonstrated an understanding of the instructions.   The patient was advised to call back or seek an in-person evaluation if the symptoms worsen or if the condition fails to improve as anticipated.  I provided 16 minutes of non-face-to-face time during this encounter.   [name], MD

## 2022-11-04 ENCOUNTER — Telehealth (HOSPITAL_COMMUNITY): Payer: Medicare HMO | Admitting: Psychiatry

## 2022-11-06 ENCOUNTER — Other Ambulatory Visit (HOSPITAL_COMMUNITY): Payer: Self-pay | Admitting: Psychiatry

## 2022-11-06 DIAGNOSIS — F3181 Bipolar II disorder: Secondary | ICD-10-CM

## 2022-12-02 ENCOUNTER — Telehealth (HOSPITAL_COMMUNITY): Payer: Medicare (Managed Care) | Admitting: Psychiatry

## 2022-12-09 ENCOUNTER — Telehealth (HOSPITAL_COMMUNITY): Payer: Medicare HMO | Admitting: Psychiatry

## 2022-12-09 DIAGNOSIS — F4312 Post-traumatic stress disorder, chronic: Secondary | ICD-10-CM | POA: Diagnosis not present

## 2022-12-09 DIAGNOSIS — F3181 Bipolar II disorder: Secondary | ICD-10-CM | POA: Diagnosis not present

## 2022-12-09 DIAGNOSIS — F5105 Insomnia due to other mental disorder: Secondary | ICD-10-CM

## 2022-12-09 MED ORDER — CLONAZEPAM 1 MG PO TABS: 1.0000 mg | ORAL_TABLET | Freq: Two times a day (BID) | ORAL | 1 refills | Status: DC | PRN

## 2022-12-09 MED ORDER — TRAZODONE HCL 150 MG PO TABS: 150.0000 mg | ORAL_TABLET | Freq: Every day | ORAL | 0 refills | Status: DC

## 2022-12-09 MED ORDER — LURASIDONE HCL 40 MG PO TABS: 40.0000 mg | ORAL_TABLET | Freq: Every day | ORAL | 0 refills | Status: DC

## 2022-12-09 MED ORDER — BUPROPION HCL ER (XL) 150 MG PO TB24: ORAL_TABLET | ORAL | 0 refills | Status: DC

## 2022-12-09 MED ORDER — GABAPENTIN 400 MG PO CAPS: 400.0000 mg | ORAL_CAPSULE | Freq: Two times a day (BID) | ORAL | 0 refills | Status: DC

## 2022-12-09 NOTE — Progress Notes (Signed)
Virtual Visit via Video Note  I connected with Lori Jensen on 12/09/22 at  1:15 PM EDT by  a video enabled telemedicine application and verified that I am speaking with the correct person using two identifiers.  Location: Patient: home Provider: office   I discussed the limitations of evaluation and management by telemedicine and the availability of in person appointments. The patient expressed understanding and agreed to proceed.  History of Present Illness: "Ok not great". Lori Jensen is suffering from some GI symptoms and had an ultrasound earlier today. Her mood has been improving. She was working out 3-4 days/week and it improved her mood. Last week she started having GI pain and her depression increased. She has low motivation, interest and energy. Her sleep is broken due to dreams. Some nights she can't get back to asleep. She is getting 5 hrs and naps during the day. Her focus has been very poor but she doesn't think she has ADHD. Her anxiety is triggered by doing her taxes and notes this is usually her response when she has to do something. She denies SI/HI. Pt denies recent manic and hypomanic symptoms including periods of decreased need for sleep, increased energy, mood lability, impulsivity, FOI, and excessive spending. Her PTSD is "ok" and seems better when she avoids some specific people. She is not taking Trazodone but can't recall why she stopped it.     Observations/Objective: Psychiatric Specialty Exam: ROS  There were no vitals taken for this visit.There is no height or weight on file to calculate BMI.  General Appearance: Casual and Fairly Groomed  Eye Contact:  Good  Speech:  Clear and Coherent and Normal Rate  Volume:  Normal  Mood:  Depressed  Affect:  Congruent  Thought Process:  Goal Directed, Linear, and Descriptions of Associations: Intact  Orientation:  Full (Time, Place, and Person)  Thought Content:  Logical  Suicidal Thoughts:  No  Homicidal Thoughts:  No   Memory:  Immediate;   Good  Judgement:  Good  Insight:  Good  Psychomotor Activity:  Normal  Concentration:  Concentration: Good  Recall:  Good  Fund of Knowledge:  Good  Language:  Good  Akathisia:  No  Handed:  Right  AIMS (if indicated):     Assets:  Communication Skills Desire for Improvement Financial Resources/Insurance Housing Leisure Time Resilience Social Support Talents/Skills Transportation Vocational/Educational  ADL's:  Intact  Cognition:  WNL  Sleep:        Assessment and Plan:     12/09/2022    1:23 PM 09/30/2022    2:02 PM 06/03/2022    1:34 PM 04/08/2022    1:29 PM 03/17/2022    1:30 PM  Depression screen PHQ 2/9  Decreased Interest '2 3 3 3 2  '$ Down, Depressed, Hopeless '2 3 3 1 2  '$ PHQ - 2 Score '4 6 6 4 4  '$ Altered sleeping 3 0 '3 3 3  '$ Tired, decreased energy '3 3 3 3 3  '$ Change in appetite '1 3 3 3 3  '$ Feeling bad or failure about yourself  '3 3 3 3 3  '$ Trouble concentrating '3 3 3 3 3  '$ Moving slowly or fidgety/restless 0 0 0 0 2  Suicidal thoughts 0 2 2 0 1  PHQ-9 Score '17 20 23 19 22  '$ Difficult doing work/chores Very difficult Very difficult Extremely dIfficult Very difficult Very difficult    Flowsheet Row Video Visit from 12/09/2022 in [facility] Video Visit from 09/30/2022 in  [place] [facility] from 07/27/2022 in [facility] No Risk Error: Q3, 4, or 5 should not be populated when Q2 is No No Risk          Pt is aware that these meds carry a teratogenic risk. Pt will discuss plan of action if she does or plans to become pregnant in the future.  Status of current problems: worsening depression and insomnia   Medication management with supportive therapy. Risks and benefits, side effects and alternative treatment options discussed with patient. Pt was given an opportunity to ask questions about medication,  illness, and treatment. All current psychiatric medications have been reviewed and discussed with the patient and adjusted as clinically appropriate.  Pt verbalized understanding and verbal consent obtained for treatment.  Meds: restart Trazodone 75-'150mg'$  po qHS prn insomnia May consider Increasing dose Latuda  to '60mg'$  po qD  if no improvement in mood.  1. Bipolar 2 disorder (HCC) - buPROPion (WELLBUTRIN XL) 150 MG 24 hr tablet; TAKE THREE TABLETS BY MOUTH EVERY MORNING  Dispense: 270 tablet; Refill: 0 - gabapentin (NEURONTIN) 400 MG capsule; Take 1 capsule (400 mg total) by mouth 2 (two) times daily.  Dispense: 180 capsule; Refill: 0 - lurasidone (LATUDA) 40 MG TABS tablet; Take 1 tablet (40 mg total) by mouth daily with breakfast.  Dispense: 90 tablet; Refill: 0  2. Chronic post-traumatic stress disorder (PTSD) - clonazePAM (KLONOPIN) 1 MG tablet; Take 1 tablet (1 mg total) by mouth 2 (two) times daily as needed for anxiety.  Dispense: 60 tablet; Refill: 1 - gabapentin (NEURONTIN) 400 MG capsule; Take 1 capsule (400 mg total) by mouth 2 (two) times daily.  Dispense: 180 capsule; Refill: 0  3. Insomnia due to other mental disorder - traZODone (DESYREL) 150 MG tablet; Take 1 tablet (150 mg total) by mouth at bedtime.  Dispense: 90 tablet; Refill: 0     Labs: at next visit    Therapy: brief supportive therapy provided. Discussed psychosocial stressors in detail.      Collaboration of Care: Other none today  Patient/Guardian was advised Release of Information must be obtained prior to any record release in order to collaborate their care with an outside provider. Patient/Guardian was advised if they have not already done so to contact the registration department to sign all necessary forms in order for [place] to release information regarding their care.   Consent: Patient/Guardian gives verbal consent for treatment and assignment of benefits for services provided during this visit.  Patient/Guardian expressed understanding and agreed to proceed.      Follow Up Instructions: Follow up in 2-3 months or sooner if needed    I discussed the assessment and treatment plan with the patient. The patient was provided an opportunity to ask questions and all were answered. The patient agreed with the plan and demonstrated an understanding of the instructions.   The patient was advised to call back or seek an in-person evaluation if the symptoms worsen or if the condition fails to improve as anticipated.  I provided 12 minutes of non-face-to-face time during this encounter.   [name], MD

## 2023-01-18 ENCOUNTER — Ambulatory Visit: Payer: Medicare HMO | Admitting: Podiatry

## 2023-01-27 ENCOUNTER — Ambulatory Visit: Payer: Medicare HMO | Admitting: Podiatry

## 2023-01-31 ENCOUNTER — Encounter (HOSPITAL_COMMUNITY): Payer: Self-pay

## 2023-02-02 ENCOUNTER — Ambulatory Visit: Payer: Medicare HMO | Admitting: Podiatry

## 2023-02-17 ENCOUNTER — Telehealth (HOSPITAL_BASED_OUTPATIENT_CLINIC_OR_DEPARTMENT_OTHER): Payer: Medicare HMO | Admitting: Psychiatry

## 2023-02-17 DIAGNOSIS — G2589 Other specified extrapyramidal and movement disorders: Secondary | ICD-10-CM | POA: Diagnosis not present

## 2023-02-17 DIAGNOSIS — F5105 Insomnia due to other mental disorder: Secondary | ICD-10-CM

## 2023-02-17 DIAGNOSIS — F4312 Post-traumatic stress disorder, chronic: Secondary | ICD-10-CM

## 2023-02-17 DIAGNOSIS — F99 Mental disorder, not otherwise specified: Secondary | ICD-10-CM

## 2023-02-17 DIAGNOSIS — F3181 Bipolar II disorder: Secondary | ICD-10-CM | POA: Diagnosis not present

## 2023-02-17 MED ORDER — GABAPENTIN 400 MG PO CAPS: 400.0000 mg | ORAL_CAPSULE | Freq: Two times a day (BID) | ORAL | 0 refills | Status: DC

## 2023-02-17 MED ORDER — BUPROPION HCL ER (XL) 150 MG PO TB24: ORAL_TABLET | ORAL | 0 refills | Status: DC

## 2023-02-17 MED ORDER — AMANTADINE HCL 100 MG PO CAPS: 100.0000 mg | ORAL_CAPSULE | Freq: Every day | ORAL | 0 refills | Status: DC

## 2023-02-17 MED ORDER — CLONAZEPAM 1 MG PO TABS: 1.0000 mg | ORAL_TABLET | Freq: Two times a day (BID) | ORAL | 0 refills | Status: DC | PRN

## 2023-02-17 MED ORDER — LURASIDONE HCL 40 MG PO TABS: 40.0000 mg | ORAL_TABLET | Freq: Every day | ORAL | 0 refills | Status: DC
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
## 2023-03-03 ENCOUNTER — Telehealth (HOSPITAL_BASED_OUTPATIENT_CLINIC_OR_DEPARTMENT_OTHER): Payer: Medicare HMO | Admitting: Psychiatry

## 2023-03-03 DIAGNOSIS — F4312 Post-traumatic stress disorder, chronic: Secondary | ICD-10-CM | POA: Diagnosis not present

## 2023-03-03 DIAGNOSIS — F5105 Insomnia due to other mental disorder: Secondary | ICD-10-CM

## 2023-03-03 MED ORDER — TRAZODONE HCL 150 MG PO TABS: 150.0000 mg | ORAL_TABLET | Freq: Every day | ORAL | 0 refills | Status: DC

## 2023-03-03 MED ORDER — CLONAZEPAM 1 MG PO TABS: 1.0000 mg | ORAL_TABLET | Freq: Two times a day (BID) | ORAL | 0 refills | Status: DC | PRN

## 2023-03-03 NOTE — Progress Notes (Signed)
Virtual Visit via Video Note  I connected with Lori Jensen on 03/03/23 at  3:00 PM EDT by a video enabled telemedicine application and verified that I am speaking with the correct person using two identifiers.  Location: Patient: home Provider: office   I discussed the limitations of evaluation and management by telemedicine and the availability of in person appointments. The patient expressed understanding and agreed to proceed.  History of Present Illness: Lori Jensen has been taking Amantadine for 10 days now. She has noticed a small decrease in tongue movements but nothing else. Lori Jensen is worried about the EPS becoming permanent. She wants to stop Amantadine and Latuda. Her other major concern is that she is not sleeping well. She is waking up multiple times a night. Lori Jensen is broke until the end of the month. The depression is worse due to stressors. She is managing it as best she can. The date of her pending breakup with boyfriend is tomorrow. She admits to more crying spells. Lori Jensen is scared but is trying to take care of herself. She has a new puppy and is restarting therapy weekly again.  Pt denies recent manic and hypomanic symptoms including periods of decreased need for sleep, increased energy, mood lability, impulsivity, FOI, and excessive spending. She denies SI/HI. Lori Jensen is also worried about working with a new psychiatrist.      Observations/Objective: Psychiatric Specialty Exam: ROS  There were no vitals taken for this visit.There is no height or weight on file to calculate BMI.  General Appearance: Casual  Eye Contact:  Good  Speech:  Clear and Coherent and Normal Rate  Volume:  Normal  Mood:  Anxious and Depressed  Affect:  Congruent  Thought Process:  Goal Directed, Linear, and Descriptions of Associations: Intact  Orientation:  Full (Time, Place, and Person)  Thought Content:  Logical  Suicidal Thoughts:  No  Homicidal Thoughts:  No  Memory:  Immediate;   Good   Judgement:  Good  Insight:  Good  Psychomotor Activity:  Normal  Concentration:  Concentration: Good  Recall:  Good  Fund of Knowledge:  Good  Language:  Good  Akathisia:  No  Handed:  Right  AIMS (if indicated):     Assets:  Communication Skills Desire for Improvement Financial Resources/Insurance Housing Resilience Social Support Talents/Skills Transportation Vocational/Educational  ADL's:  Intact  Cognition:  WNL  Sleep:        Assessment and Plan:     02/17/2023    2:02 PM 12/09/2022    1:23 PM 09/30/2022    2:02 PM 06/03/2022    1:34 PM 04/08/2022    1:29 PM  Depression screen PHQ 2/9  Decreased Interest 2 2 3 3 3   Down, Depressed, Hopeless 3 2 3 3 1   PHQ - 2 Score 5 4 6 6 4   Altered sleeping 2 3 0 3 3  Tired, decreased energy 3 3 3 3 3   Change in appetite 3 1 3 3 3   Feeling bad or failure about yourself  3 3 3 3 3   Trouble concentrating 3 3 3 3 3   Moving slowly or fidgety/restless 0 0 0 0 0  Suicidal thoughts 0 0 2 2 0  PHQ-9 Score 19 17 20 23 19   Difficult doing work/chores Extremely dIfficult Very difficult Very difficult Extremely dIfficult Very difficult    Flowsheet Row Video Visit from 02/17/2023 in [facility] Video Visit from 12[facility]09/2022 in [facility] Video Visit from 09/30/2022  in [facility]  C-SSRS RISK CATEGORY No Risk No Risk Error: Q3, 4, or 5 should not be populated when Q2 is No          Pt is aware that these meds carry a teratogenic risk. Pt will discuss plan of action if she does or plans to become pregnant in the future.  Status of current problems: ongoing anxiety and insomnia   Medication management with supportive therapy. Risks and benefits, side effects and alternative treatment options discussed with patient. Pt was given an opportunity to ask questions about medication, illness, and treatment. All current  psychiatric medications have been reviewed and discussed with the patient and adjusted as clinically appropriate.  Pt verbalized understanding and verbal consent obtained for treatment.  Meds: d/c Latuda due to EPS D/c Amantadine  Continue Wellbutrin 450mg  po qD Continue Neurontin 400mg  po BID  Restart Trazodone 150mg  po qHS  [name] has been sensitive to medication changes in the past so I will wait a few weeks before starting a new mood stabilizer or antipsychotic. We discussed the potential to become manic without a mood stabilizer or antipsychotic on board. Pt will monitor for signs of worsening depression or manic like symptoms. [name] will go to the crisis center or ED if needed 1. Chronic post-traumatic stress disorder (PTSD) - clonazePAM (KLONOPIN) 1 MG tablet; Take 1 tablet (1 mg total) by mouth 2 (two) times daily as needed for anxiety.  Dispense: 60 tablet; Refill: 0  2. Insomnia due to other mental disorder - traZODone (DESYREL) 150 MG tablet; Take 1 tablet (150 mg total) by mouth at bedtime.  Dispense: 1 tablet; Refill: 0     Labs: none     Therapy: brief supportive therapy provided. Discussed psychosocial stressors in detail.     Collaboration of Care: Other restarting therapy weekly  Patient/Guardian was advised Release of Information must be obtained prior to any record release in order to collaborate their care with an outside provider. Patient/Guardian was advised if they have not already done so to contact the registration department to sign all necessary forms in order for [place] to release information regarding their care.   Consent: Patient/Guardian gives verbal consent for treatment and assignment of benefits for services provided during this visit. Patient/Guardian expressed understanding and agreed to proceed.       Follow Up Instructions: Follow up in 1 months or sooner if needed with a new psychiatrist  Patient informed that I am leaving [facility] in 03/2023 and I  relayed that they will be getting a new provider after that. Patient verbalized understanding and agreed with the plan.     I discussed the assessment and treatment plan with the patient. The patient was provided an opportunity to ask questions and all were answered. The patient agreed with the plan and demonstrated an understanding of the instructions.   The patient was advised to call back or seek an in-person evaluation if the symptoms worsen or if the condition fails to improve as anticipated.  I provided 13 minutes of non-face-to-face time during this encounter.   Oletta Darter, MD

## 2023-04-06 ENCOUNTER — Telehealth (HOSPITAL_BASED_OUTPATIENT_CLINIC_OR_DEPARTMENT_OTHER): Payer: Medicare HMO | Admitting: Psychiatry

## 2023-04-06 ENCOUNTER — Encounter (HOSPITAL_COMMUNITY): Payer: Self-pay | Admitting: Psychiatry

## 2023-04-06 DIAGNOSIS — F4312 Post-traumatic stress disorder, chronic: Secondary | ICD-10-CM | POA: Diagnosis not present

## 2023-04-06 DIAGNOSIS — F3181 Bipolar II disorder: Secondary | ICD-10-CM | POA: Diagnosis not present

## 2023-04-06 DIAGNOSIS — F99 Mental disorder, not otherwise specified: Secondary | ICD-10-CM | POA: Diagnosis not present

## 2023-04-06 DIAGNOSIS — F5105 Insomnia due to other mental disorder: Secondary | ICD-10-CM | POA: Diagnosis not present

## 2023-04-06 MED ORDER — CLONAZEPAM 1 MG PO TABS: 1.0000 mg | ORAL_TABLET | Freq: Every day | ORAL | 0 refills | Status: DC

## 2023-04-06 MED ORDER — GABAPENTIN 400 MG PO CAPS
400.0000 mg | ORAL_CAPSULE | Freq: Three times a day (TID) | ORAL | 1 refills | Status: DC
Start: 2023-04-06 — End: 2023-05-13
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
## 2023-04-14 ENCOUNTER — Ambulatory Visit (HOSPITAL_COMMUNITY): Payer: Medicare HMO

## 2023-04-14 DIAGNOSIS — F3181 Bipolar II disorder: Secondary | ICD-10-CM

## 2023-04-14 NOTE — Progress Notes (Signed)
Patient came in today for her ordered Genesight test. Patient was polite and well groomed, she asked a few questions about the test and the billing. I was able to provide

## 2023-05-09 ENCOUNTER — Telehealth (HOSPITAL_COMMUNITY): Payer: Medicare HMO | Admitting: Psychiatry

## 2023-05-13 ENCOUNTER — Encounter (HOSPITAL_COMMUNITY): Payer: Self-pay | Admitting: Psychiatry

## 2023-05-13 ENCOUNTER — Telehealth (HOSPITAL_COMMUNITY): Payer: Medicare HMO | Admitting: Psychiatry

## 2023-05-13 VITALS — Wt 197.0 lb

## 2023-05-13 DIAGNOSIS — F5105 Insomnia due to other mental disorder: Secondary | ICD-10-CM | POA: Diagnosis not present

## 2023-05-13 DIAGNOSIS — F3181 Bipolar II disorder: Secondary | ICD-10-CM | POA: Diagnosis not present

## 2023-05-13 DIAGNOSIS — F4312 Post-traumatic stress disorder, chronic: Secondary | ICD-10-CM | POA: Diagnosis not present

## 2023-05-13 MED ORDER — BUPROPION HCL ER (XL) 150 MG PO TB24
ORAL_TABLET | ORAL | 0 refills | Status: DC
Start: 2023-05-13 — End: 2023-08-02

## 2023-05-13 MED ORDER — GABAPENTIN 400 MG PO CAPS: 400.0000 mg | ORAL_CAPSULE | Freq: Three times a day (TID) | ORAL | 2 refills | Status: DC

## 2023-05-13 MED ORDER — CLONAZEPAM 1 MG PO TABS: 1.0000 mg | ORAL_TABLET | Freq: Every day | ORAL | 2 refills | Status: DC
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
## 2023-06-20 ENCOUNTER — Telehealth (HOSPITAL_COMMUNITY): Payer: Medicare HMO | Admitting: Psychiatry

## 2023-08-01 ENCOUNTER — Other Ambulatory Visit (HOSPITAL_COMMUNITY): Payer: Self-pay | Admitting: Psychiatry

## 2023-08-01 DIAGNOSIS — F3181 Bipolar II disorder: Secondary | ICD-10-CM

## 2023-08-02 ENCOUNTER — Encounter (HOSPITAL_COMMUNITY): Payer: Self-pay | Admitting: Psychiatry

## 2023-08-02 ENCOUNTER — Telehealth (HOSPITAL_BASED_OUTPATIENT_CLINIC_OR_DEPARTMENT_OTHER): Payer: Medicare HMO | Admitting: Psychiatry

## 2023-08-02 VITALS — Wt 190.0 lb

## 2023-08-02 DIAGNOSIS — F3181 Bipolar II disorder: Secondary | ICD-10-CM

## 2023-08-02 DIAGNOSIS — F4312 Post-traumatic stress disorder, chronic: Secondary | ICD-10-CM | POA: Diagnosis not present

## 2023-08-02 DIAGNOSIS — F5105 Insomnia due to other mental disorder: Secondary | ICD-10-CM

## 2023-08-02 DIAGNOSIS — F99 Mental disorder, not otherwise specified: Secondary | ICD-10-CM

## 2023-08-02 MED ORDER — BUPROPION HCL ER (XL) 300 MG PO TB24: 300.0000 mg | ORAL_TABLET | Freq: Every day | ORAL | 0 refills | Status: DC

## 2023-08-02 MED ORDER — CLONAZEPAM 1 MG PO TABS
1.0000 mg | ORAL_TABLET | Freq: Every day | ORAL | 1 refills | Status: DC
Start: 2023-08-02 — End: 2023-10-03

## 2023-08-02 MED ORDER — GABAPENTIN 400 MG PO CAPS
400.0000 mg | ORAL_CAPSULE | Freq: Four times a day (QID) | ORAL | 1 refills | Status: DC
Start: 2023-08-02 — End: 2023-10-03
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
## 2023-10-03 ENCOUNTER — Encounter (HOSPITAL_COMMUNITY): Payer: Self-pay | Admitting: Psychiatry

## 2023-10-03 ENCOUNTER — Telehealth (HOSPITAL_BASED_OUTPATIENT_CLINIC_OR_DEPARTMENT_OTHER): Payer: HMO | Admitting: Psychiatry

## 2023-10-03 VITALS — Wt 194.0 lb

## 2023-10-03 DIAGNOSIS — F4312 Post-traumatic stress disorder, chronic: Secondary | ICD-10-CM | POA: Diagnosis not present

## 2023-10-03 DIAGNOSIS — F3181 Bipolar II disorder: Secondary | ICD-10-CM

## 2023-10-03 MED ORDER — GABAPENTIN 400 MG PO CAPS: 400.0000 mg | ORAL_CAPSULE | Freq: Four times a day (QID) | ORAL | 2 refills | Status: DC

## 2023-10-03 MED ORDER — CLONAZEPAM 1 MG PO TABS: 1.0000 mg | ORAL_TABLET | Freq: Two times a day (BID) | ORAL | 2 refills | Status: DC | PRN

## 2023-10-03 MED ORDER — BUPROPION HCL ER (XL) 300 MG PO TB24: 300.0000 mg | ORAL_TABLET | Freq: Every day | ORAL | 0 refills | Status: DC
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
## 2023-10-13 NOTE — Addendum Note (Signed)
Addended by: Kathryne Sharper T on: 10/13/2023 10:47 AM   Modules accepted: Level of Service

## 2023-11-09 ENCOUNTER — Telehealth (HOSPITAL_COMMUNITY): Payer: Self-pay | Admitting: *Deleted

## 2023-11-09 ENCOUNTER — Encounter (HOSPITAL_COMMUNITY): Payer: Self-pay

## 2023-11-09 NOTE — Telephone Encounter (Signed)
Writer spoke with pt who called after speaking with her pharmacy about gabapentin and Klonopin 90 day refills. Pt says that she is on a fixed income and having the 90 day fill really helps her stay compliant. Writer explained that provider prefers to write for 30 day with 2 fills when it is a large amount of medication, or a control. Pt verbalizes understanding but would like you to review. She has an appointment scheduled for 01/02/24.

## 2023-11-10 ENCOUNTER — Telehealth (HOSPITAL_COMMUNITY): Payer: Self-pay | Admitting: *Deleted

## 2023-11-10 NOTE — Telephone Encounter (Signed)
Nurse spoke with pt to advise that provider will discuss 90 day scripts at her next visit on 01/12/24. Pt verbalizes understanding. Will call with any questions or concerns.

## 2024-01-02 ENCOUNTER — Telehealth (HOSPITAL_COMMUNITY): Payer: Medicare HMO | Admitting: Psychiatry

## 2024-01-03 ENCOUNTER — Telehealth (HOSPITAL_COMMUNITY): Payer: Self-pay | Admitting: Psychiatry

## 2024-01-03 ENCOUNTER — Telehealth (HOSPITAL_BASED_OUTPATIENT_CLINIC_OR_DEPARTMENT_OTHER): Admitting: Psychiatry

## 2024-01-03 ENCOUNTER — Encounter (HOSPITAL_COMMUNITY): Payer: Self-pay | Admitting: Psychiatry

## 2024-01-03 DIAGNOSIS — F4312 Post-traumatic stress disorder, chronic: Secondary | ICD-10-CM | POA: Diagnosis not present

## 2024-01-03 DIAGNOSIS — F3181 Bipolar II disorder: Secondary | ICD-10-CM

## 2024-01-03 MED ORDER — GABAPENTIN 400 MG PO CAPS: 400.0000 mg | ORAL_CAPSULE | Freq: Four times a day (QID) | ORAL | 0 refills | Status: DC

## 2024-01-03 MED ORDER — CLONAZEPAM 1 MG PO TABS: 1.0000 mg | ORAL_TABLET | Freq: Two times a day (BID) | ORAL | 2 refills | Status: DC | PRN

## 2024-01-03 MED ORDER — BUPROPION HCL ER (XL) 300 MG PO TB24: 300.0000 mg | ORAL_TABLET | Freq: Every day | ORAL | 0 refills | Status: DC

## 2024-01-03 NOTE — Telephone Encounter (Signed)
 I called patient about her concern for today's visit.  Patient was not happy about the time she spent with the provider.  I addressed her concern and explained due to another call I have to put her on hold which she understands.  I did talk about her symptoms, current medication, and if needed to consider IOP.  Patient appreciate my return phone call and does not feel she needed any intervention at this time and like to keep the appointment 3 months however agreed to give [place] a call back if anything change.  She also like to keep appointment with me in the future.

## 2024-01-03 NOTE — Telephone Encounter (Signed)
 This pt called the office at 10:28 to inquire about her session time on 01/03/24 with the provider.  She started crying after I told her it was a 20 minute appt.  She said that he got on the appt at 10:20am and was off at 10:25am.  She said the provider did not listen to her issue, that he told her to hold on and came back to the phone and said... what a minute, wait a minute, wait a minute, ok, bye and hung up.  She didn't receive a follow-up.  She said that if he had something going on at home(she assumed he was home) that he should've said that/something.  She said that she has to pay for an entire session that lasted and is not happy about that.

## 2024-01-03 NOTE — Telephone Encounter (Addendum)
 D:  MH-IOP Case Manager was upfront making copies and overheard front desk voicing concern that pt was tearful on the phone whenever she called with complaints about a provider.  A:  Placed call to see if patient would be interested in returning to virtual MH-IOP?  Pt was very tearful, stating she had seen her psychiatrist this morning and didn't feel heard.  "I don't know what was going on with him this morning but he seemed very distracted."  Pt mentioned she was telling him about what happened a few weeks ago when she felt that she needed to go into the hospital d/t suicidal ideations, and he put her on hold.  "He said hold on, hold on and whenever he came back, he just finished up the session and didn't go back to where we left off.  I feel like he didn't care.  I don't know if he had something that came up wherever he was at the time; but he should've said and rescheduled with me or something." Provided pt with support.  Inquired if she would like to return to virtual MH-IOP?  Pt declined stating she feels that she is in good hands with her weight loss team.  "I will keep MH-IOP in mind if I need it in the future though."  Pt denies SI/HI or A/V hallucinations. Before hanging up with pt, she had stopped crying.  Informed pt that she would hear from  Everlene Balls (clinical mgr) later today b/c he would be following up on her complaint this morning.  CM will reach out to the psychiatrist.  R:  Pt receptive.
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
## 2024-02-12 ENCOUNTER — Ambulatory Visit
Admission: RE | Admit: 2024-02-12 | Discharge: 2024-02-12 | Disposition: A | Discharge status: Home / Self Care | Source: Ambulatory Visit

## 2024-02-12 ENCOUNTER — Ambulatory Visit (INDEPENDENT_AMBULATORY_CARE_PROVIDER_SITE_OTHER): Admitting: Radiology

## 2024-02-12 VITALS — HR 90 | Temp 97.8°F | Resp 16

## 2024-02-12 DIAGNOSIS — R0602 Shortness of breath: Secondary | ICD-10-CM | POA: Diagnosis not present

## 2024-02-12 MED ORDER — ALBUTEROL SULFATE (2.5 MG/3ML) 0.083% IN NEBU
2.5000 mg | INHALATION_SOLUTION | Freq: Once | RESPIRATORY_TRACT | Status: AC
  Administered 2024-02-12: 2.5 mg via RESPIRATORY_TRACT

## 2024-02-12 MED ORDER — PREDNISONE 20 MG PO TABS: 40.0000 mg | ORAL_TABLET | Freq: Every day | ORAL | 0 refills | Status: AC

## 2024-02-12 NOTE — ED Triage Notes (Signed)
 Patient presents to UC for chest tightness, SOB, muscle aches, nasal congestion x 1 week. Treating symptoms with inhaler, OTC cough meds.

## 2024-02-12 NOTE — Discharge Instructions (Addendum)
 Please use your albuterol inhaler 3 times daily (every 6 hours) for the next several days.  Prednisone 40 mg daily for the next 5 days (start tomorrow morning)  Please go to the emergency department if symptoms worsen or become severe - including chest pain, worsening shortness of breath, fast heart rate, or feeling your heart is skipping beats

## 2024-02-12 NOTE — ED Provider Notes (Signed)
 [name]    CSN: [id number] Arrival date & time: 02/12/24  1158     History   Chief Complaint Chief Complaint  Patient presents with   Generalized Body Aches    Chest tightness, shortness of breath, muscle ache, slight nasal congestion - Entered by patient    HPI Lori Jensen is a 53 y.o. female.  1 week history of chest tightness, shortness of breath, body aches. No known fever. She is not having any cough. No other viral symptoms like congestion, sore throat, ear pain, NVD, or rash.  Tried using albuterol inhaler this morning, and theraflu tea  Denies known sick contacts or recent ravel She does vape daily History of anxiety that can causes shortness of breath, but this feels different   Past Medical History:  Diagnosis Date   Anxiety    Depression    Dysmenorrhea    Fibromyalgia 11/2017   Heart murmur    Hypertension    PONV (postoperative nausea and vomiting)    Uterine fibroid     Patient Active Problem List   Diagnosis Date Noted   Bipolar 2 disorder (HCC) 04/07/2022   Chronic post-traumatic stress disorder (PTSD) 04/07/2022   Vulval cellulitis 03/25/2018   Vaginal discharge 03/25/2018   Candidiasis of mouth 03/25/2018   Abscess of vulva 03/25/2018   Migraine 12/03/2017   Neck fullness 09/21/2017   Irregular menstrual bleeding 11/10/2016   Moderate episode of recurrent major depressive disorder (HCC) 11/06/2015   Obesity (BMI 30-39.9) 11/03/2015   Myofascial pain 11/03/2015   Essential hypertension 11/03/2015   SMOKER 07/23/2009   PALPITATIONS 06/12/2009   PANIC ATTACKS 11/24/2006   DEPRESSIVE DISORDER, NOS 11/24/2006   MITRAL VALVE PROLAPSE 11/24/2006   CHEST PAIN 11/24/2006    Past Surgical History:  Procedure Laterality Date   DILATION AND CURETTAGE OF UTERUS     DILATION AND CURETTAGE OF UTERUS  01/16/2013   Procedure: DILATATION AND CURETTAGE;  Surgeon: Deann Exon, MD;  Location: [facility];  Service: Gynecology;;    ENDOMETRIAL ABLATION     uterine ablation  08/13/2011    OB History     Gravida  7   Para  2   Term  2   Preterm      AB  5   Living  2      SAB  3   IAB  2   Ectopic      Multiple      Live Births               Home Medications    Prior to Admission medications   Medication Sig Start Date End Date Taking? Authorizing Provider  celecoxib (CELEBREX) 100 MG capsule Take 100 mg by mouth. 11/09/23  Yes [provider]  diclofenac Sodium (VOLTAREN) 1 % GEL Apply topically. 07/05/23  Yes [provider]  hydrocortisone (ANUSOL-HC) 2.5 % rectal cream Apply rectally 2 times daily 11/18/23 11/17/24 Yes [provider]  ondansetron  (ZOFRAN -ODT) 4 MG disintegrating tablet Take 4 mg by mouth. 03/17/23  Yes [provider]  predniSONE (DELTASONE) 20 MG tablet Take 2 tablets (40 mg total) by mouth daily with breakfast for 5 days. 02/13/24 02/18/24 Yes Teniyah Seivert, Ivette Marks, PA-C  amitriptyline (ELAVIL) 25 MG tablet Take 25 mg by mouth at bedtime.    [provider]  buPROPion  (WELLBUTRIN  XL) 300 MG 24 hr tablet Take 1 tablet (300 mg total) by mouth daily. 01/03/24   Arfeen, Bronson Canny, MD  Cholecalciferol  1.25 MG (50000 UT) capsule Take 1 capsule by mouth once a week. 08/31/23   [provider]  clonazePAM  (KLONOPIN ) 1 MG tablet Take 1 tablet (1 mg total) by mouth 2 (two) times daily as needed for anxiety. 01/03/24   Arfeen, Syed T, MD  fluticasone (FLONASE) 50 MCG/ACT nasal spray Place 1 spray into both nostrils daily.    [provider]  gabapentin  (NEURONTIN ) 400 MG capsule Take 1 capsule (400 mg total) by mouth 4 (four) times daily. 01/03/24 04/02/24  Arfeen, Bronson Canny, MD  hydrochlorothiazide (HYDRODIURIL) 12.5 MG tablet Take 12.5 mg by mouth daily.    [provider]  methocarbamol  (ROBAXIN ) 500 MG tablet Take 500 mg by mouth every 6 (six) hours as needed. 08/08/23   [provider]  pantoprazole  (PROTONIX ) 20 MG tablet  Take 1 tablet (20 mg total) by mouth 2 (two) times daily. 09/19/21   Lind Repine, MD  potassium chloride  (KLOR-CON ) 10 MEQ tablet Take 1 tablet (10 mEq total) by mouth daily for 30 doses. 07/27/22 08/26/22  Arvilla Birmingham, MD  PROAIR HFA 108 [phone] Base) MCG/ACT inhaler Inhale 1-2 puffs into the lungs every 6 (six) hours as needed for wheezing or shortness of breath. 03/11/18   [provider]  Semaglutide,0.25 or 0.5MG /DOS, (OZEMPIC, 0.25 OR 0.5 MG/DOSE,) 2 MG/1.5ML SOPN Inject 0.25 mg into the skin once a week.    [provider]    Family History Family History  Problem Relation Age of Onset   Heart disease Mother    Anxiety disorder Mother    Depression Mother    Stomach cancer Father    Heart disease Father    Other Father        non hodgkins lymphoma   Alcohol abuse Maternal Aunt    Drug abuse Maternal Aunt    Alcohol abuse Brother    Alcohol abuse Maternal Uncle    Alcohol abuse Paternal Uncle    Colon cancer Neg Hx     Social History Social History   Tobacco Use   Smoking status: Former    Current packs/day: 0.00    Types: Cigarettes, E-cigarettes    Start date: 08/13/1996    Quit date: 08/13/2016    Years since quitting: 7.5   Smokeless tobacco: Never   Tobacco comments:    Reports she does Vap occasionally   Vaping Use   Vaping status: Every Day   Start date: 03/27/2018   Substances: Nicotine, Flavoring   Devices: njoy  Substance Use Topics   Alcohol use: Not Currently    Comment: occasionally   Drug use: No     Allergies   Codeine, Meloxicam , Amoxicillin, Doxycycline, and Lamictal  [lamotrigine ]   Review of Systems Review of Systems Per HPI  Physical Exam Triage Vital Signs ED Triage Vitals  Encounter Vitals Group     BP      Systolic BP Percentile      Diastolic BP Percentile      Pulse      Resp      Temp      Temp src      SpO2      Weight      Height      Head Circumference      Peak Flow      Pain Score       Pain Loc      Pain Education      Exclude from Growth Chart    No data  found.  Updated Vital Signs Pulse 90   Temp 97.8 F (36.6 C) (Oral)   Resp 16   SpO2 97%   Physical Exam Vitals and nursing note reviewed.  Constitutional:      General: She is not in acute distress.    Appearance: Normal appearance. She is not ill-appearing.  HENT:     Right Ear: Tympanic membrane and ear canal normal.     Left Ear: Tympanic membrane and ear canal normal.     Nose: No rhinorrhea.     Mouth/Throat:     Pharynx: Oropharynx is clear. No posterior oropharyngeal erythema.  Eyes:     Conjunctiva/sclera: Conjunctivae normal.     Pupils: Pupils are equal, round, and reactive to light.  Cardiovascular:     Rate and Rhythm: Normal rate and regular rhythm.     Pulses: Normal pulses.          Radial pulses are 2+ on the right side and 2+ on the left side.     Heart sounds: Normal heart sounds.  Pulmonary:     Effort: Pulmonary effort is normal. No respiratory distress.     Breath sounds: Normal breath sounds. No wheezing, rhonchi or rales.  Abdominal:     Palpations: Abdomen is soft.  Musculoskeletal:     Cervical back: Normal range of motion. No rigidity.  Lymphadenopathy:     Cervical: No cervical adenopathy.  Neurological:     General: No focal deficit present.     Mental Status: She is alert and oriented to person, place, and time.      UC Treatments / Results  Labs (all labs ordered are listed, but only abnormal results are displayed) Labs Reviewed - No data to display  EKG  Radiology DG Chest 2 View Result Date: 02/12/2024 CLINICAL DATA:  Shortness of breath and chest tightness for 1 week. EXAM: CHEST - 2 VIEW COMPARISON:  07/27/2022 FINDINGS: The heart size and mediastinal contours are within normal limits. Both lungs are clear. The visualized skeletal structures are unremarkable. IMPRESSION: No active cardiopulmonary disease. Electronically Signed   By: Marlyce Sine M.D.   On:  02/12/2024 13:02    Procedures Procedures   Medications Ordered in UC Medications  albuterol (PROVENTIL) (2.5 MG/3ML) 0.083% nebulizer solution 2.5 mg (2.5 mg Nebulization Given 02/12/24 1245)    Initial Impression / Assessment and Plan / UC Course  I have reviewed the triage vital signs and the nursing notes.  Pertinent labs & imaging results that were available during my care of the patient were reviewed by me and considered in my medical decision making (see chart for details).  Patient with concerns for shortness of breath, chest tightness with deep breath.  She feels she cannot take a full breath.  She is very well-appearing, afebrile, vitals are stable.  Normocardic and satting 98% room air.  Lungs are clear throughout.  She is in no acute distress. Offered albuterol nebulizer treatment.  After this intervention she is feeling improved.  Able to take a deep breath without tightness.  Feels more open.  Chest x-ray is negative. Images independently reviewed by me, agree with radiology interpretation.  Discussed findings with patient. Given that she is feeling better with nebulizer, vitals are stable, and she is very well-appearing, recommend monitoring at home.  Prednisone 40 mg daily for 5 days along with albuterol inhaler 3 times daily to reduce lung inflammation and shortness of breath.  I have low concern for emergent etiology such as  PE.  We have discussed strict ED precautions for any new acute or worsening of symptoms.  Patient verbalizes understanding, agreeable to plan.  All questions answered.  Final Clinical Impressions(s) / UC Diagnoses   Final diagnoses:  Shortness of breath     Discharge Instructions      Please use your albuterol inhaler 3 times daily (every 6 hours) for the next several days.  Prednisone 40 mg daily for the next 5 days (start tomorrow morning)  Please go to the emergency department if symptoms worsen or become severe - including chest pain,  worsening shortness of breath, fast heart rate, or feeling your heart is skipping beats      ED Prescriptions     Medication Sig Dispense Auth. Provider   predniSONE (DELTASONE) 20 MG tablet Take 2 tablets (40 mg total) by mouth daily with breakfast for 5 days. 10 tablet Haven Foss, Ivette Marks, PA-C      PDMP not reviewed this encounter.   Creighton Doffing, [place] 02/12/24 1331

## 2024-03-19 ENCOUNTER — Other Ambulatory Visit (HOSPITAL_COMMUNITY): Payer: Self-pay | Admitting: Psychiatry

## 2024-03-19 DIAGNOSIS — F3181 Bipolar II disorder: Secondary | ICD-10-CM

## 2024-04-03 ENCOUNTER — Telehealth (HOSPITAL_COMMUNITY): Admitting: Psychiatry

## 2024-04-24 ENCOUNTER — Encounter (HOSPITAL_COMMUNITY): Payer: Self-pay | Admitting: Psychiatry

## 2024-04-24 ENCOUNTER — Telehealth (HOSPITAL_BASED_OUTPATIENT_CLINIC_OR_DEPARTMENT_OTHER): Admitting: Psychiatry

## 2024-04-24 VITALS — Wt 198.0 lb

## 2024-04-24 DIAGNOSIS — F3181 Bipolar II disorder: Secondary | ICD-10-CM | POA: Diagnosis not present

## 2024-04-24 DIAGNOSIS — F4312 Post-traumatic stress disorder, chronic: Secondary | ICD-10-CM

## 2024-04-24 DIAGNOSIS — F99 Mental disorder, not otherwise specified: Secondary | ICD-10-CM

## 2024-04-24 DIAGNOSIS — F5105 Insomnia due to other mental disorder: Secondary | ICD-10-CM

## 2024-04-24 MED ORDER — GABAPENTIN 600 MG PO TABS: 600.0000 mg | ORAL_TABLET | Freq: Three times a day (TID) | ORAL | 0 refills | Status: DC

## 2024-04-24 MED ORDER — BUPROPION HCL ER (XL) 300 MG PO TB24: 300.0000 mg | ORAL_TABLET | Freq: Every day | ORAL | 0 refills | Status: DC

## 2024-04-24 MED ORDER — CLONAZEPAM 1 MG PO TABS: 1.0000 mg | ORAL_TABLET | Freq: Two times a day (BID) | ORAL | 0 refills | Status: DC | PRN
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
## 2024-05-17 NOTE — Progress Notes (Signed)
 Subjective   HPI:  Lori Jensen is a 53 y.o.  female who presents to the office with:  Chief Complaint  Patient presents with  . Medicare Wellness Visit  . Gaps In Care    Zoster vaccine// advised to receive at the pharmacy Mammogram// patient schedule for mobile mammogram   Annual Physical Exam:  Patient presents today for further annual physical exam.   They have a past medical history that is significant for:  Past Medical History:  Diagnosis Date  . Adult acne   . Anxiety   . Asthma (*)   . Cancer (*)    skin  . Depression   . Essential hypertension 11/03/2015  . Hypertension   . Insomnia 08/17/2016  . Migraine 12/03/2017  . Palpitations    CARDIC WORK-UP  2011, [facility]ORMAL  .    They currently take:  Current Medications[1].   Patient's health maintenance topics include mammogram, PAP.  They are not up-to-date.  Family history: COPD, CAD, non-Hodgkin's lymphoma Social history: Former smoker -   Does not qualify for LDCT Surgical history: Endometrial ablation  Acute Concerns: noted below   ROS: Negative for constitutional, CV, Resp, Abd/GU symptoms except as noted above in HPI.  PMH: Past medical history, Past surgical history, Social history, family history were reviewed as noted in EMR.  Pertinent for:  Past Medical History:  Diagnosis Date  . Adult acne   . Anxiety   . Asthma (*)   . Cancer (*)    skin  . Depression   . Essential hypertension 11/03/2015  . Hypertension   . Insomnia 08/17/2016  . Migraine 12/03/2017  . Palpitations    CARDIC WORK-UP  2011, NORMAL     Health Maintenance Due  Topic Date Due  . Pap Smear  12/08/2021  . Zoster Vaccine (1 of 2) Never done  . COVID-19 Vaccine (6 - 2024-25 season) 05/29/2023  . Mammogram  04/19/2024  . Creatinine Level  05/30/2024    Medications and allergies reviewed.   Objective    Physical Exam:  Vital Signs: BP 132/84 (BP Location: Left Upper Arm, Patient Position: Sitting)    Pulse 77   Temp 97.5 F (36.4 C) (Temporal)   Resp 16   Ht 5' 5 (1.651 m)   Wt 189 lb 12.8 oz (86.1 kg)   SpO2 98%   BMI 31.58 kg/m   Wt Readings from Last 3 Encounters:  05/18/24 189 lb 12.8 oz (86.1 kg)  11/18/23 201 lb 9.6 oz (91.4 kg)  10/06/23 196 lb 12.8 oz (89.3 kg)   General:  Well-developed, well-nourished female in no acute distress. HEENT: Head- /AT.  Eyes- EOMI.  PERRLA.  Anicteric.  Nose- non edematous turbinates bilaterally.  O-P- clear without tonsillar hypertrophy or exudate.  Uvula is midline. Neck: supple without thyromegaly, lymphadenopathy or bruits. Heart:  regular rate and rhythm without murmurs, rubs or gallops. Lungs: CTA x 2. Chest wall: benign. Abdomen: soft, non-tender, without HSM or masses.  Positive bowel sounds.  No bruits. Musculoskeletal exam: moves all 4 extremities well. Neuro: Cranial Nerves II- XII intact. Motor strength 5/5 upper and lower extremities and symmetrical. Sensation is intact. Bicep, tricep, patellar, achilles reflexes are 2+, equal and symmetric.   Derm: warm, moist Back: full range of motion and non-tender. Extremities: without edema. Pulses: 2+ radial, dorsalis pedis, posterior tibial pulses bilaterally Psych: Normal Mood and Affect, normal speech and makes good eye contact Breast/Genital exam deferred per patient's request.  Assessment/Plan   Orders  Placed This Encounter  Procedures  . Mammo 3D Tomo Screening Bilateral  . CBC And Differential  . Comprehensive Metabolic Panel  . Lipid Panel  . Hemoglobin A1c  . TSH Rfx on Abnormal to Free T4  . Vitamin D 25 Hydroxy  . Ambulatory referral to Obstetrics / Gynecology    1. Medicare annual wellness visit, subsequent   2. Annual physical exam   3. Encounter for screening mammogram for malignant neoplasm of breast   4. Obesity, Class I, BMI 30-34.9   5. Essential hypertension   6. Bipolar 2 disorder (*)   7. Vitamin D deficiency   8. LGSIL on Pap smear of cervix     Annual Physical Exam Health maintenance issues including appropriate cancer screening, healthy diet, exercise, and tobacco avoidance/cessation were discussed with the patient.   Routine labs and screening tests ordered. Mobile mammogram scheduled for 9/30 Follow-up annually for CPE;   Obesity, Class I, BMI 30-34.9 Patient continues to work with external weight loss team.  Was switched from Ozempic to Zepbound.  Started on 5 mg.  She has noticed significant appetite suppression.  I did discuss with patient to inform her weight loss team as they may consider decreasing dose to 2.5 mg weekly to see if this is better tolerated.  Essential hypertension  Blood pressure well-controlled.  Continue with current regimen.  No ACS symptoms.  Bipolar 2 disorder  (*)  Patient continues external management with psychiatry team.  Vitamin D deficiency  Vitamin D check today.  Follow pending results.  LGSIL on Pap smear of cervix  Upon review of her chart, patient was identified to have LSIL with recommendations for 1 year recall for her Pap smear that was due in 2023.  Patient previously had a colposcopy that was negative for malignancy.  Patient unfortunately has been lost to follow-up with her OB/GYN team.  I offered Pap smear today but patient declines.  She prefers to return to OB/GYN team.  A referral was placed for patient to schedule ASAP.  She was counseled extensively on the need for follow-up and the risks of cervical cancer   Follow up in about 6 months (around 11/18/2024) for HTN and chronic condition f/u.  Future Appointments  Date Time Provider Department Center  06/04/2024  3:45 PM Marcello MARLA Lennox, MD [facility] None  06/26/2024 11:40 AM [id number] MAMMO MOBILE [facility]  11/16/2024  2:30 PM Lannie Gell, PA-C [facility] None    Medications at end of visit today: Current Medications[2]  Patient Care Team: Lannie Gell, PA-C as PCP - General (Physician Assistant) Lannie Gell, PA-C  (Physician Assistant) Lonni Bowers, MD as Consulting Physician (Gastroenterology) Suzon Saba, RN as Registered Nurse (Family Medicine)    Medicare AWV  Lori Jensen is a 53 y.o. female who presents for her subsequent annual wellness visit for Medicare.  Clinical documentation was reviewed and is accessible via encounter-level attachments.    Any physical exam components or additional concerns beyond the scope of the Annual Wellness Visit may be documented in a separate note within this encounter.  Medicare Required Components     Reviewed and updated this visit by provider: Tobacco  Allergies  Meds  Problems  Med Hx  Surg Hx  Fam Hx  PDMP       Medicare required assessment: Future risk of substance use disorder / overdose risk of 8% is typical (1.3-20%).    Patient Care Team: Lannie Gell, PA-C as PCP - General (Physician  Assistant) Lannie Gell, PA-C (Physician Assistant) Lonni Bowers, MD as Consulting Physician (Gastroenterology) Suzon Saba, RN as Registered Nurse (Family Medicine)  Vitals   Vitals:   05/18/24 1451  BP: 132/84  Patient Position: Sitting  Pulse: 77  Temp: 97.5 F (36.4 C)  TempSrc: Temporal  Resp: 16  Height: 5' 5 (1.651 m)  Weight: 189 lb 12.8 oz (86.1 kg)  SpO2: 98%  BMI (Calculated): 31.6    Disposition   1. Medicare annual wellness visit, subsequent (Primary) -     CBC And Differential; Future -     Comprehensive Metabolic Panel; Future -     Lipid Panel; Future -     Hemoglobin A1c; Future -     TSH Rfx on Abnormal to Free T4; Future -     Vitamin D 25 Hydroxy; Future 2. Annual physical exam -     CBC And Differential; Future -     Comprehensive Metabolic Panel; Future -     Lipid Panel; Future -     Hemoglobin A1c; Future -     TSH Rfx on Abnormal to Free T4; Future -     Vitamin D 25 Hydroxy; Future 3. Encounter for screening mammogram for malignant neoplasm of breast -     Mammo 3D Tomo Screening Bilateral;  Future 4. Obesity, Class I, BMI 30-34.9 Assessment & Plan: Patient continues to work with external weight loss team.  Was switched from Ozempic to Zepbound.  Started on 5 mg.  She has noticed significant appetite suppression.  I did discuss with patient to inform her weight loss team as they may consider decreasing dose to 2.5 mg weekly to see if this is better tolerated. Orders: -     CBC And Differential; Future -     Comprehensive Metabolic Panel; Future -     Lipid Panel; Future -     Hemoglobin A1c; Future -     TSH Rfx on Abnormal to Free T4; Future -     Vitamin D 25 Hydroxy; Future 5. Essential hypertension Assessment & Plan:  Blood pressure well-controlled.  Continue with current regimen.  No ACS symptoms. Orders: -     CBC And Differential; Future -     Comprehensive Metabolic Panel; Future -     Lipid Panel; Future -     Hemoglobin A1c; Future -     TSH Rfx on Abnormal to Free T4; Future -     Vitamin D 25 Hydroxy; Future 6. Bipolar 2 disorder (*) Assessment & Plan:  Patient continues external management with psychiatry team. Orders: -     CBC And Differential; Future -     Comprehensive Metabolic Panel; Future -     Lipid Panel; Future -     Hemoglobin A1c; Future -     TSH Rfx on Abnormal to Free T4; Future -     Vitamin D 25 Hydroxy; Future 7. Vitamin D deficiency Assessment & Plan:  Vitamin D check today.  Follow pending results. Orders: -     CBC And Differential; Future -     Comprehensive Metabolic Panel; Future -     Lipid Panel; Future -     Hemoglobin A1c; Future -     TSH Rfx on Abnormal to Free T4; Future -     Vitamin D 25 Hydroxy; Future 8. LGSIL on Pap smear of cervix Assessment & Plan:  Upon review of her chart, patient was identified to have LSIL with recommendations for  1 year recall for her Pap smear that was due in 2023.  Patient previously had a colposcopy that was negative for malignancy.  Patient unfortunately has been lost to follow-up  with her OB/GYN team.  I offered Pap smear today but patient declines.  She prefers to return to OB/GYN team.  A referral was placed for patient to schedule ASAP.  She was counseled extensively on the need for follow-up and the risks of cervical cancer Orders: -     Ambulatory referral to Obstetrics / Gynecology   Follow up in about 6 months (around 11/18/2024) for HTN and chronic condition f/u.   Health maintenance issues were discussed with the patient.  A written plan was provided to the patient in the form of patient instructions in the After Visit Summary document.          [1]  Current Outpatient Medications:  .  albuterol  sulfate HFA (VENTOLIN  HFA) 108 (90 Base) MCG/ACT inhaler, Inhale two puffs into the lungs every 6 (six) hours as needed for Wheezing., Disp: 18 g, Rfl: 2 .  amitriptyline HCl (ELAVIL) 25 mg tablet, Take one tablet (25 mg dose) by mouth at bedtime., Disp: 90 tablet, Rfl: 1 .  buPROPion  hcl (WELLBUTRIN  XL) 300 mg 24 hr tablet, Take one tablet (300 mg dose) by mouth daily. daily, Disp: , Rfl:  .  celecoxib (CELEBREX) 100 mg capsule, Take one capsule (100 mg dose) by mouth 2 (two) times daily., Disp: 180 capsule, Rfl: 1 .  clonazePAM  (KLONOPIN ) 1 mg tablet, Take one tablet (1 mg dose) by mouth 2 (two) times a day as needed., Disp: , Rfl:  .  diclofenac sodium (VOLTAREN) 1 % gel, Apply topically 4 (four) times a day as needed., Disp: 350 g, Rfl: 2 .  fluticasone propionate (FLONASE) 50 mcg/actuation nasal spray, SHAKE LIQUID AND USE 1 SPRAY IN EACH NOSTRIL DAILY, Disp: 48 mL, Rfl: 2 .  gabapentin  (NEURONTIN ) 600 mg tablet, Take one tablet (600 mg dose) by mouth 3 (three) times a day., Disp: , Rfl:  .  hydroCHLOROthiazide 12.5 mg tablet, Take one tablet (12.5 mg dose) by mouth daily., Disp: 90 tablet, Rfl: 2 .  hydrocortisone (ANUSOL-HC) 2.5% rectal cream, Apply rectally 2 times daily, Disp: 30 g, Rfl: 1 .  methocarbamol  (ROBAXIN ) 500 MG tablet, Take one tablet (500 mg  dose) by mouth 4 (four) times daily., Disp: 360 tablet, Rfl: 1 .  ondansetron  (ZOFRAN ) 4 mg tablet, Take one tablet (4 mg dose) by mouth every 8 (eight) hours as needed for Nausea., Disp: 20 tablet, Rfl: 0 .  tirzepatide (ZEPBOUND) 5 mg/0.5 mL injection, Inject 0.5 mLs (5 mg dose) into the skin every 7 (seven) days., Disp: , Rfl:  .  vitamin D3, cholecalciferol, (OPTIMAL-D) 50,000 units CAPS, Take one capsule by mouth once a week., Disp: 12 capsule, Rfl: 0 [2]  Current Outpatient Medications:  .  albuterol  sulfate HFA (VENTOLIN  HFA) 108 (90 Base) MCG/ACT inhaler, Inhale two puffs into the lungs every 6 (six) hours as needed for Wheezing., Disp: 18 g, Rfl: 2 .  amitriptyline HCl (ELAVIL) 25 mg tablet, Take one tablet (25 mg dose) by mouth at bedtime., Disp: 90 tablet, Rfl: 1 .  buPROPion  hcl (WELLBUTRIN  XL) 300 mg 24 hr tablet, Take one tablet (300 mg dose) by mouth daily. daily, Disp: , Rfl:  .  celecoxib (CELEBREX) 100 mg capsule, Take one capsule (100 mg dose) by mouth 2 (two) times daily., Disp: 180 capsule, Rfl: 1 .  clonazePAM  (KLONOPIN ) 1 mg tablet, Take one tablet (1 mg dose) by mouth 2 (two) times a day as needed., Disp: , Rfl:  .  diclofenac sodium (VOLTAREN) 1 % gel, Apply topically 4 (four) times a day as needed., Disp: 350 g, Rfl: 2 .  fluticasone propionate (FLONASE) 50 mcg/actuation nasal spray, SHAKE LIQUID AND USE 1 SPRAY IN EACH NOSTRIL DAILY, Disp: 48 mL, Rfl: 2 .  gabapentin  (NEURONTIN ) 600 mg tablet, Take one tablet (600 mg dose) by mouth 3 (three) times a day., Disp: , Rfl:  .  hydroCHLOROthiazide 12.5 mg tablet, Take one tablet (12.5 mg dose) by mouth daily., Disp: 90 tablet, Rfl: 2 .  hydrocortisone (ANUSOL-HC) 2.5% rectal cream, Apply rectally 2 times daily, Disp: 30 g, Rfl: 1 .  methocarbamol  (ROBAXIN ) 500 MG tablet, Take one tablet (500 mg dose) by mouth 4 (four) times daily., Disp: 360 tablet, Rfl: 1 .  ondansetron  (ZOFRAN ) 4 mg tablet, Take one tablet (4 mg dose) by  mouth every 8 (eight) hours as needed for Nausea., Disp: 20 tablet, Rfl: 0 .  tirzepatide (ZEPBOUND) 5 mg/0.5 mL injection, Inject 0.5 mLs (5 mg dose) into the skin every 7 (seven) days., Disp: , Rfl:  .  vitamin D3, cholecalciferol, (OPTIMAL-D) 50,000 units CAPS, Take one capsule by mouth once a week., Disp: 12 capsule, Rfl: 0

## 2024-05-21 ENCOUNTER — Encounter (HOSPITAL_COMMUNITY): Payer: Self-pay | Admitting: Psychiatry

## 2024-05-21 ENCOUNTER — Other Ambulatory Visit (HOSPITAL_COMMUNITY): Payer: Self-pay | Admitting: Psychiatry

## 2024-05-21 ENCOUNTER — Telehealth (HOSPITAL_BASED_OUTPATIENT_CLINIC_OR_DEPARTMENT_OTHER): Admitting: Psychiatry

## 2024-05-21 VITALS — Wt 186.0 lb

## 2024-05-21 DIAGNOSIS — F4312 Post-traumatic stress disorder, chronic: Secondary | ICD-10-CM

## 2024-05-21 DIAGNOSIS — F99 Mental disorder, not otherwise specified: Secondary | ICD-10-CM

## 2024-05-21 DIAGNOSIS — F5105 Insomnia due to other mental disorder: Secondary | ICD-10-CM

## 2024-05-21 DIAGNOSIS — F3181 Bipolar II disorder: Secondary | ICD-10-CM

## 2024-05-21 MED ORDER — GABAPENTIN 600 MG PO TABS: 600.0000 mg | ORAL_TABLET | Freq: Three times a day (TID) | ORAL | 2 refills | Status: DC

## 2024-05-21 MED ORDER — BUPROPION HCL ER (XL) 300 MG PO TB24: 300.0000 mg | ORAL_TABLET | Freq: Every day | ORAL | 0 refills | Status: DC

## 2024-05-21 MED ORDER — CLONAZEPAM 1 MG PO TABS: 1.0000 mg | ORAL_TABLET | Freq: Two times a day (BID) | ORAL | 2 refills | Status: DC | PRN
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
## 2024-06-26 ENCOUNTER — Telehealth (HOSPITAL_COMMUNITY): Payer: Self-pay | Admitting: Professional

## 2024-06-26 NOTE — Telephone Encounter (Signed)
 See call log

## 2024-06-28 ENCOUNTER — Ambulatory Visit (INDEPENDENT_AMBULATORY_CARE_PROVIDER_SITE_OTHER): Admitting: Professional

## 2024-06-28 DIAGNOSIS — F4312 Post-traumatic stress disorder, chronic: Secondary | ICD-10-CM | POA: Diagnosis not present

## 2024-06-28 DIAGNOSIS — F3181 Bipolar II disorder: Secondary | ICD-10-CM | POA: Diagnosis not present
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
## 2024-06-28 NOTE — Psych (Signed)
 Active     OP Depression     LTG: Reduce frequency, intensity, and duration of depression symptoms so that daily functioning is improved (Initial)     Start:  06/28/24    Expected End:  10/02/24         LTG: Increase coping skills to manage depression and improve ability to perform daily activities (Initial)     Start:  06/28/24    Expected End:  10/02/24         STG: Lori Jensen will attend at least 80% of scheduled PHP sessions    (Initial)     Start:  06/28/24    Expected End:  10/02/24         STG: Lori Jensen will complete at least 80% of assigned homework  (Initial)     Start:  06/28/24    Expected End:  10/02/24         Encourage Lori Jensen to participate in recovery peer support activities weekly      Start:  06/28/24         Therapist will educate patient on cognitive distortions and the rationale for treatment of depression     Start:  06/28/24         Lori Jensen will identify AT LEAST 3 cognitive distortions they are currently using and write reframing statements to replace them     Start:  06/28/24           Lori Jensen verbally agrees to treatment plan.

## 2024-07-02 ENCOUNTER — Ambulatory Visit (HOSPITAL_COMMUNITY)

## 2024-07-02 ENCOUNTER — Encounter (HOSPITAL_COMMUNITY): Payer: Self-pay | Admitting: Licensed Clinical Social Worker

## 2024-07-02 ENCOUNTER — Ambulatory Visit (INDEPENDENT_AMBULATORY_CARE_PROVIDER_SITE_OTHER): Admitting: Professional

## 2024-07-02 DIAGNOSIS — F5105 Insomnia due to other mental disorder: Secondary | ICD-10-CM

## 2024-07-02 DIAGNOSIS — F3181 Bipolar II disorder: Secondary | ICD-10-CM

## 2024-07-02 DIAGNOSIS — F4312 Post-traumatic stress disorder, chronic: Secondary | ICD-10-CM

## 2024-07-02 DIAGNOSIS — G2589 Other specified extrapyramidal and movement disorders: Secondary | ICD-10-CM

## 2024-07-02 DIAGNOSIS — R4589 Other symptoms and signs involving emotional state: Secondary | ICD-10-CM

## 2024-07-02 NOTE — Progress Notes (Signed)
 Psychiatric Initial Adult Assessment   Patient Identification: Lori Jensen MRN:  [id number] Date of Evaluation:  07/02/2024 Referral Source: [place], MD Chief Complaint:   Feeling worthlessness  Visit Diagnosis:    ICD-10-CM   1. Bipolar 2 disorder (HCC)  F31.81     2. Insomnia due to other mental disorder  F51.05    F99       History of Present Illness:  Lori Jensen is a 53 year old female who presents due to worsening depression and anxiety symptoms.  Patient carries a diagnosis related to depression, anxiety and bipolar disorder.  She is currently prescribed Wellbutrin , Klonopin  and amitriptyline.  She reports she is taking and tolerating medications well reports experiencing hopelessness, increased depression and suicidal ideations with a plan to overdose.  Reports struggling with self-esteem issues and reports she is codependent.  She reports she and her son who is 24 has a slightly strained relationship.  States she has a daughter who is 12 who resides in [place]  does not understand mental illness which can be frustrating at times when she attempts to have conversations with therapist.  Lori Jensen reports limited support.  Does report she has a significant other  complicated denied that she is currently employed.  States she is been disabled for the past 24 years.  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  States she was previously hospitalized 7 years ago at [facility] due to similar symptoms related to hopelessness and increased depression.  Patient to start [facility] urgent care partial hospitalization program on 07/02/2024  During evaluation Lori Jensen is sitting tearful throughout this assessment.;she is alert/oriented x 4; calm/cooperative; and mood congruent with affect.  Patient is speaking in a clear tone at moderate volume, and normal pace; with good eye contact. Her thought process is coherent and relevant; There is no indication that she is  currently responding to internal/external stimuli or experiencing delusional thought content.  Patient denies suicidal/self-harm/homicidal ideation, psychosis, and paranoia.  Patient has remained calm throughout assessment and has answered questions appropriately.   Associated Signs/Symptoms: Depression Symptoms:  depressed mood, anhedonia, difficulty concentrating, hopelessness, (Hypo) Manic Symptoms:  Distractibility, Anxiety Symptoms:  Excessive Worry, Psychotic Symptoms:  Hallucinations: None PTSD Symptoms: NA  Past Psychiatric History: Previous inpatient admissions 7+ years ago.  States she was hospitalized at a [facility].  Diagnoses with major depressive disorder, generalized anxiety disorder and bipolar disorder.  Currently prescribed gabapentin , Klonopin , Wellbutrin  and amitriptyline.  Previous Psychotropic Medications: Yes   Substance Abuse History in the last 12 months:  No.  Consequences of Substance Abuse: NA  Past Medical History:  Past Medical History:  Diagnosis Date   Anxiety    Depression    Dysmenorrhea    Fibromyalgia 11/2017   Heart murmur    Hypertension    PONV (postoperative nausea and vomiting)    Uterine fibroid     Past Surgical History:  Procedure Laterality Date   DILATION AND CURETTAGE OF UTERUS     DILATION AND CURETTAGE OF UTERUS  01/16/2013   Procedure: DILATATION AND CURETTAGE;  Surgeon: Peggye Gull, MD;  Location: [facility];  Service: Gynecology;;   ENDOMETRIAL ABLATION     uterine ablation  08/13/2011    Family Psychiatric History: Reports undiagnosed, (mother and sister)  Family History:  Family History  Problem Relation Age of Onset   Heart disease Mother    Anxiety disorder Mother    Depression Mother    Stomach cancer Father  Heart disease Father    Other Father        non hodgkins lymphoma   Alcohol abuse Maternal Aunt    Drug abuse Maternal Aunt    Alcohol abuse Brother    Alcohol abuse Maternal Uncle    Alcohol abuse  Paternal Uncle    Colon cancer Neg Hx     Social History:   Social History   Socioeconomic History   Marital status: Divorced    Spouse name: Not on file   Number of children: 2   Years of education: Not on file   Highest education level: GED or equivalent  Occupational History   Occupation: MORTGAGE Paramedic: [employer]  Tobacco Use   Smoking status: Former    Current packs/day: 0.00    Types: Cigarettes, E-cigarettes    Start date: 08/13/1996    Quit date: 08/13/2016    Years since quitting: 7.8   Smokeless tobacco: Never   Tobacco comments:    Reports she does Vap occasionally   Vaping Use   Vaping status: Every Day   Start date: 03/27/2018   Substances: Nicotine, Flavoring   Devices: njoy  Substance and Sexual Activity   Alcohol use: Not Currently    Comment: occasionally   Drug use: No   Sexual activity: Yes    Partners: Male    Birth control/protection: None, Surgical  Other Topics Concern   Not on file  Social History Narrative   Reports at times has been emotionally mistreated by partner but denies any other issues.    Social Drivers of Health   Financial Resource Strain: Medium Risk (05/18/2024)   Received from [facility]   Overall Financial Resource Strain (CARDIA)    How hard is it for you to pay for the very basics like food, housing, medical care, and heating?: Somewhat hard  Food Insecurity: Food Insecurity Present (05/18/2024)   Received from [facility]   Hunger Vital Sign    Within the past 12 months, you worried that your food would run out before you got the money to buy more.: Often true    Within the past 12 months, the food you bought just didn't last and you didn't have money to get more.: Often true  Transportation Needs: No Transportation Needs (05/18/2024)   Received from [facility] - Transportation    In the past 12 months, has lack of transportation kept you from medical appointments or from  getting medications?: No    In the past 12 months, has lack of transportation kept you from meetings, work, or from getting things needed for daily living?: No  Physical Activity: Insufficiently Active (05/18/2024)   Received from [facility]   Exercise Vital Sign    On average, how many days per week do you engage in moderate to strenuous exercise (like a brisk walk)?: 2 days    On average, how many minutes do you engage in exercise at this level?: 10 min  Stress: Stress Concern Present (05/18/2024)   Received from [facility] - Occupational Stress Questionnaire    Do you feel stress - tense, restless, nervous, or anxious, or unable to sleep at night because your mind is troubled all the time - these days?: Very much  Social Connections: Somewhat Isolated (05/18/2024)   Received from [facility]   Social Network    How would you rate your social network (family, work, friends)?:  Restricted participation with some degree of social isolation    Additional Social History:   Allergies:   Allergies  Allergen Reactions   Codeine Anaphylaxis   Meloxicam  Other (See Comments)    dyspepsia   Amoxicillin Rash    Has patient had a PCN reaction causing immediate rash, facial/tongue/throat swelling, SOB or lightheadedness with hypotension: no Has patient had a PCN reaction causing severe rash involving mucus membranes or skin necrosis: no Has patient had a PCN reaction that required hospitalization no Has patient had a PCN reaction occurring within the last 10 years: yes If all of the above answers are NO, then may proceed with Cephalosporin use.    Doxycycline Rash   Lamictal  [Lamotrigine ] Rash    Metabolic Disorder Labs: No results found for: HGBA1C, MPG No results found for: PROLACTIN No results found for: CHOL, TRIG, HDL, CHOLHDL, VLDL, LDLCALC No results found for: TSH  Therapeutic Level Labs: No results found for:  LITHIUM  No results found for: CBMZ No results found for: VALPROATE  Current Medications: Current Outpatient Medications  Medication Sig Dispense Refill   amitriptyline (ELAVIL) 25 MG tablet Take 25 mg by mouth at bedtime.     buPROPion  (WELLBUTRIN  XL) 300 MG 24 hr tablet Take 1 tablet (300 mg total) by mouth daily. 90 tablet 0   celecoxib (CELEBREX) 100 MG capsule Take 100 mg by mouth.     Cholecalciferol 1.25 MG (50000 UT) capsule Take 1 capsule by mouth once a week.     clonazePAM  (KLONOPIN ) 1 MG tablet Take 1 tablet (1 mg total) by mouth 2 (two) times daily as needed for anxiety. 30 tablet 2   diclofenac Sodium (VOLTAREN) 1 % GEL Apply topically.     fluticasone (FLONASE) 50 MCG/ACT nasal spray Place 1 spray into both nostrils daily.     gabapentin  (NEURONTIN ) 600 MG tablet Take 1 tablet (600 mg total) by mouth 3 (three) times daily. 90 tablet 2   hydrochlorothiazide (HYDRODIURIL) 12.5 MG tablet Take 12.5 mg by mouth daily.     hydrocortisone (ANUSOL-HC) 2.5 % rectal cream Apply rectally 2 times daily     methocarbamol  (ROBAXIN ) 500 MG tablet Take 500 mg by mouth every 6 (six) hours as needed.     ondansetron  (ZOFRAN ) 4 MG tablet Take 4 mg by mouth every 8 (eight) hours as needed for nausea.     pantoprazole  (PROTONIX ) 20 MG tablet Take 1 tablet (20 mg total) by mouth 2 (two) times daily. 30 tablet 0   potassium chloride  (KLOR-CON ) 10 MEQ tablet Take 1 tablet (10 mEq total) by mouth daily for 30 doses. 30 tablet 0   PROAIR  HFA 108 (90 Base) MCG/ACT inhaler Inhale 1-2 puffs into the lungs every 6 (six) hours as needed for wheezing or shortness of breath.  2   tirzepatide (ZEPBOUND) 5 MG/0.5ML Pen Inject 5 mg into the skin once a week.     No current facility-administered medications for this visit.    Musculoskeletal: Strength & Muscle Tone: within normal limits Gait & Station: normal Patient leans: N/A  Psychiatric Specialty Exam: Review of Systems  There were no vitals  taken for this visit.There is no height or weight on file to calculate BMI.  General Appearance: Casual  Eye Contact:  Good  Speech:  Clear and Coherent  Volume:  Normal  Mood:  Anxious and Depressed  Affect:  Congruent  Thought Process:  Coherent  Orientation:  Full (Time, Place, and Person)  Thought Content:  Logical  Suicidal Thoughts:  No  Homicidal Thoughts:  No  Memory:  Immediate;   Good Remote;   Good  Judgement:  Good  Insight:  Good  Psychomotor Activity:  Normal  Concentration:  Concentration: Good  Recall:  Good  Fund of Knowledge:Good  Language: Good  Akathisia:  No  Handed:  Right  AIMS (if indicated):  not done  Assets:  Communication Skills Desire for Improvement  ADL's:  Intact  Cognition: WNL  Sleep:  Good   Screenings: GAD-7    Flowsheet Row Counselor from 10/04/2017 in BEHAVIORAL HEALTH PARTIAL HOSPITALIZATION PROGRAM Counselor from 09/08/2017 in BEHAVIORAL HEALTH PARTIAL HOSPITALIZATION PROGRAM Counselor from 03/04/2017 in BEHAVIORAL HEALTH PARTIAL HOSPITALIZATION PROGRAM Counselor from 02/22/2017 in BEHAVIORAL HEALTH PARTIAL HOSPITALIZATION PROGRAM  Total GAD-7 Score 17 19 12 20    PHQ2-9    Flowsheet Row Counselor from 06/28/2024 in [facility] Video Visit from [facility]/17/2023 in [facility] Video Visit from 12/09/2022 in [facility] Video Visit from 09[facility]30/2022 in [facility] Video Visit from 06/03/2022 in [facility]  PHQ-2 Total Score 4 5 4 6 6   PHQ-9 Total Score 18 19 17 20 23    Flowsheet Row Counselor from 06/28/2024 in [facility] from 02/12/2024 in [facility] Video Visit from 02/17/2023 in [facility]  C-SSRS RISK CATEGORY High Risk Error: Question 6 not populated  No Risk    Assessment and Plan:  Lori Jensen  was enrolled in Partial Hospitalization Program, patient's current medications are to be continued, the following medications are being continued and a comprehensive treatment plan will be developed and side effects of medications have been reviewed with patient  Treatment options and alternatives reviewed with patient and patient understands the above plan. Treatment plan was reviewed and agreed upon by NP T.Ezzard and patient Lori Jensen need for group services.  Collaboration of Care: Medication Management AEB continue medications as directed  Patient/Guardian was advised Release of Information must be obtained prior to any record release in order to collaborate their care with an outside provider. Patient/Guardian was advised if they have not already done so to contact the registration department to sign all necessary forms in order for us  to release information regarding their care.   Consent: Patient/Guardian gives verbal consent for treatment and assignment of benefits for services provided during this visit. Patient/Guardian expressed understanding and agreed to proceed.   Staci LOISE Ezzard, NP 10/6/20253:00 PM

## 2024-07-03 ENCOUNTER — Ambulatory Visit (HOSPITAL_COMMUNITY)

## 2024-07-03 ENCOUNTER — Ambulatory Visit (INDEPENDENT_AMBULATORY_CARE_PROVIDER_SITE_OTHER): Admitting: Professional

## 2024-07-03 DIAGNOSIS — F3181 Bipolar II disorder: Secondary | ICD-10-CM | POA: Diagnosis not present

## 2024-07-03 DIAGNOSIS — F4312 Post-traumatic stress disorder, chronic: Secondary | ICD-10-CM

## 2024-07-03 DIAGNOSIS — R4589 Other symptoms and signs involving emotional state: Secondary | ICD-10-CM

## 2024-07-04 ENCOUNTER — Telehealth (HOSPITAL_COMMUNITY): Payer: Self-pay | Admitting: Professional

## 2024-07-04 ENCOUNTER — Ambulatory Visit (HOSPITAL_COMMUNITY)

## 2024-07-04 ENCOUNTER — Ambulatory Visit (INDEPENDENT_AMBULATORY_CARE_PROVIDER_SITE_OTHER): Admitting: Licensed Clinical Social Worker

## 2024-07-04 DIAGNOSIS — F4312 Post-traumatic stress disorder, chronic: Secondary | ICD-10-CM

## 2024-07-04 DIAGNOSIS — F3181 Bipolar II disorder: Secondary | ICD-10-CM

## 2024-07-04 DIAGNOSIS — R4589 Other symptoms and signs involving emotional state: Secondary | ICD-10-CM

## 2024-07-04 NOTE — Telephone Encounter (Signed)
 Malaney called and reports she is having a rough morning after a rough night. Johanne is hyperventilating; cln is able to walk [place] through box breathing to calm her so she can speak. Cln does this multiple times throughout call. Kirstin reports having pSI last night and was thinking of calling EMS. She wanted to call and leave a msg for PHP stating she was not coming today but she did not. She reports she woke up this morning and forced herself to take a shower and get ready for group. She was glad she had not called to say she was not coming. She then texted her son's girlfriend who is pregnant to check on her after calls had gone unanswered. The relationship between Reddick and son's gf has not been good for years and pt is trying to mend it. Gf told Langley she is confused as to why Jone is trying to have a relationship now just because she is pregnant and she is not comfortable with it. Seana reports she told GF she can understand that. GF also told Bianca that Uchenna is the reason GF has attempted suicide in the past and has told son that she ODed when she did not, cut her wrists, and other gestures of self-harm/attempts. Adrionna was triggered by this and rationally understands this if GF's M.O. and is still struggling to handle her emotions around it. Jayda is able to process some. Brisia and cln spend some time discussing focusing on what she can do for self and what she can control. She is going to go to [facility] for extra support and reports she will be late. Cln informs Chartered loss adjuster and cln leading PHP today, Randall BRAVO. Suellen was about to leave the house when her son showed up to check on her after she called him tearful and upset before calling cln. She reports her son left a rifle at her house for her protection. She asked him this morning to come take it apart because she does not want it in the house with the thoughts she has had recently, and does not want to send it to his house with his crazy GF to  possibly have access to. Jackqulyn reports she is going to speak to her son for a second because I scared him this morning and then head to group. Cln encouraged her to call back if needed. Joan denies current SI/HI thoughts.

## 2024-07-05 ENCOUNTER — Ambulatory Visit (INDEPENDENT_AMBULATORY_CARE_PROVIDER_SITE_OTHER): Admitting: Licensed Clinical Social Worker

## 2024-07-05 ENCOUNTER — Ambulatory Visit (HOSPITAL_COMMUNITY)

## 2024-07-05 DIAGNOSIS — F3181 Bipolar II disorder: Secondary | ICD-10-CM | POA: Diagnosis not present

## 2024-07-05 DIAGNOSIS — F4312 Post-traumatic stress disorder, chronic: Secondary | ICD-10-CM

## 2024-07-05 DIAGNOSIS — R4589 Other symptoms and signs involving emotional state: Secondary | ICD-10-CM

## 2024-07-05 NOTE — Psych (Signed)
 [facility] PHP THERAPIST PROGRESS NOTE  Lori Jensen [id number]  Session Time: 9:00-10:00  Participation Level: Active  Behavioral Response: CasualAlertAnxious and Depressed  Type of Therapy: Group Therapy  Treatment Goals addressed: Coping  Progress Towards Goals: Initial  Interventions: CBT, DBT, Solution Focused, Strength-based, Supportive, and Reframing  Summary: Lori Jensen is a 53 y.o. female who presents with depression and anxiety symptoms, including pSI. Clinician led check-in regarding current stressors and situation, and review of patient completed daily inventory. Clinician utilized active listening and empathetic response and validated patient emotions. Clinician facilitated processing group on pertinent issues.   Therapist Response: Patient arrived within time allowed. Patient rates her depression at an 8 and her anxiety at an 8 on a scale of 1-10 with 10 being best. Pt states she had tough weekend. She reports she was able to participate in yard sale to make money for gas to get to/from PHP. She reports I wasn't my usual nice self. I was short with others and I don't know if they heard it or not. She crashed Sunday and didn't do anything. She reports goal of acclimating to group. Patient able to process. Patient engaged in discussion.          Session Time: 10:00 am - 11:00 am   Participation Level: Active   Behavioral Response: CasualAlertDepressed   Type of Therapy: Group Therapy   Treatment Goals addressed: Coping   Progress Towards Goals: Progressing   Interventions: CBT, DBT, Solution Focused, Strength-based, Supportive, and Reframing   Therapist Response: Clinician led discussion on black and white thinking, and living in the gray. Group discussed the strength of finding self-worth in self instead of in what others expect/want. Group discussed distraction skills and having a variety to choose from for different scenarios.     Summary: Pt able  to process and make connections in discussion.        Session Time: 11:00 am - 12:00 pm   Participation Level: Active   Behavioral Response: CasualAlertAnxious   Type of Therapy: Group Therapy   Treatment Goals addressed: Coping   Progress Towards Goals: Initial   Interventions: CBT, DBT, Solution Focused, Strength-based, Supportive, and Reframing   Therapist Response: Clinician introduced topic of Positive Psychology. Group watched Positive Psychology Ted-Talk. Patients discussed how their lens of life affects the way they feel. Group discussed and practiced 5 strategies to help change lens.    Summary: Pt engaged in discussion and is able to relate to the idea of moving the goal post of success and happiness out of reach.     Session Time: 12:00 pm - 12:30 pm   Participation Level: Active   Behavioral Response: CasualAlertAnxious   Type of Therapy: Group Therapy   Treatment Goals addressed: Coping   Progress Towards Goals: Initial   Interventions: Psychologist, occupational, Supportive   Therapist Response: Reflection Group: Patients encouraged to practice skills and interpersonal techniques or work on mindfulness and relaxation techniques. The importance of self-care and making skills part of a routine to increase usage were stressed.   Summary: Patient engaged and participated appropriately.     Session Time: 12:30 pm - 1:30 pm   Participation Level: Active   Behavioral Response: CasualAlertAnxious   Type of Therapy: Group Therapy   Treatment Goals addressed: Coping   Progress Towards Goals: Initial   Interventions: OT group   Therapist Response: Group was led by occupational therapist, Edward Hollan.    Summary: Pt engaged and participated in discussion.  Session Time: 1:30 pm - 2:00 pm   Participation Level: Active   Behavioral Response: CasualAlertAnxious   Type of Therapy: Group Therapy   Treatment Goals addressed: Coping    Progress Towards Goals: Initial   Interventions: CBT, DBT, Solution Focused, Strength-based, Supportive, and Reframing   Therapist Response: 1:30 pm - 1:50 pm: Cln continued topic of positive psychology. Pt identified 1 of the 5 activities discussed to try to change lens.  1:50 - 2:00 pm: Clinician led check-out. Clinician assessed for immediate needs, medication compliance and efficacy, and safety concerns?   Summary: 1:30 pm - 1:50 pm: Pt participated and engaged in discussion. She reports she will writing 3 gratitudes at bedtime each night.   1:50 - 2:00 pm: At check-out, patient reports no immediate concerns. Patient demonstrates progress as evidenced by engagement and responsiveness to treatment. Patient denies SI/HI/self-harm thoughts at the end of group.  Suicidal/Homicidal: Nowithout intent/plan  Plan: Pt will continue in PHP while working to decrease depression and anxiety symptoms, decrease pSI, increase daily functioning, and increase ability to manage symptoms in a healthy manner.   Collaboration of Care: Medication Management AEB Tanika Lewis,NP  Patient/Guardian was advised Release of Information must be obtained prior to any record release in order to collaborate their care with an outside provider. Patient/Guardian was advised if they have not already done so to contact the registration department to sign all necessary forms in order for us  to release information regarding their care.   Consent: Patient/Guardian gives verbal consent for treatment and assignment of benefits for services provided during this visit. Patient/Guardian expressed understanding and agreed to proceed.   Diagnosis: Bipolar 2 disorder (HCC) [F31.81]    1. Bipolar 2 disorder (HCC)   2. Insomnia due to other mental disorder       [name], [facility] 07/02/2024

## 2024-07-05 NOTE — Psych (Signed)
 [facility] BH PHP THERAPIST PROGRESS NOTE  IVEY NEMBHARD [id number]  Session Time: 9:00-10:00  Participation Level: Active  Behavioral Response: CasualAlertAnxious and Depressed  Type of Therapy: Group Therapy  Treatment Goals addressed: Coping  Progress Towards Goals: Initial  Interventions: CBT, DBT, Solution Focused, Strength-based, Supportive, and Reframing  Summary: Lori Jensen is a 53 y.o. female who presents with depression and anxiety symptoms, including pSI. Clinician led check-in regarding current stressors and situation, and review of patient completed daily inventory. Clinician utilized active listening and empathetic response and validated patient emotions. Clinician facilitated processing group on pertinent issues.   Therapist Response: Patient arrived within time allowed. Patient rates her depression at a 6 and her anxiety at an 9 on a scale of 1-10 with 10 being best. Pt states she felt overwhelmed last night and had a discussion with her daughter and son about pt's current mental health. She reports she was able to share what she needs from them. She reports she feels she is too emotional and wants to work on emotional regulation. Pt describes a lot of black/white thinking, and judgement of self when other's do not act as she expects. Patient able to process. Patient engaged in discussion.          Session Time: 10:00 am - 11:00 am  Participation Level: Active  Behavioral Response: Casual and GuardedAlertAnxious, Depressed, and flat  Type of Therapy: Group Therapy  Treatment Goals addressed: Coping  Progress Towards Goals: Progressing  Interventions: CBT, DBT, Solution Focused, Strength-based, Supportive, and Reframing  Therapist Response: Clinician led discussion on how to increase supports in real-life vs online. Group discussed wearing a mask for others and how to refill own self-love/self-care cup while not depending on others to identify  self-worth.  Summary: Pt engaged in discussion. Pt identifies working out has been helpful to fill her own cup in the past but wants to find more options.    Session Time: 11:00 am - 12:00 pm  Participation Level: Active  Behavioral Response: Casual and GuardedAlertAnxious, Depressed, and flat  Type of Therapy: Group Therapy  Treatment Goals addressed: Coping  Progress Towards Goals: Progressing  Interventions: CBT, DBT, Solution Focused, Strength-based, Supportive, and Reframing  Therapist Response: Group was led by [facility] chaplain, Amy Delores.  Summary: Pt engaged in discussion.    Session Time: 12:00 pm - 12:30 pm  Participation Level: Active  Behavioral Response: Casual and GuardedAlertAnxious, Depressed, and flat  Type of Therapy: Group Therapy  Treatment Goals addressed: Coping  Progress Towards Goals: Progressing  Interventions: Psychologist, occupational, Supportive  Therapist Response: Reflection Group: Patients encouraged to practice skills and interpersonal techniques or work on mindfulness and relaxation techniques. The importance of self-care and making skills part of a routine to increase usage were stressed.  Summary: Pt engaged in discussion.    Session Time: 12:30 pm - 1:30 pm   Participation Level: Active   Behavioral Response: CasualAlertAnxious   Type of Therapy: Group Therapy   Treatment Goals addressed: Coping   Progress Towards Goals: Progressing   Interventions: OT group   Therapist Response: Group was led by occupational therapist, Edward Hollan.    Summary: Pt engaged in discussion.            Session Time: 1:30 pm - 1:45 pm   Participation Level: Active   Behavioral Response: CasualAlertAnxious   Type of Therapy: Group Therapy   Treatment Goals addressed: Coping   Progress Towards Goals: Progressing   Interventions: CBT, DBT, Solution  Focused, Strength-based, Supportive, and Reframing   Therapist Response:  1:30 - 1:45 pm: Clinician led check-out. Clinician assessed for immediate needs, medication compliance and efficacy, and safety concerns?   Summary: 1:30 pm - 1:45 pm: At check-out, patient reports no immediate concerns. Patient demonstrates progress as evidenced by engagement and responsiveness to treatment. Patient denies SI/HI/self-harm thoughts at the end of group.  Suicidal/Homicidal: Nowithout intent/plan  Plan: Pt will continue in PHP while working to decrease depression and anxiety symptoms, decrease pSI, increase daily functioning, and increase ability to manage symptoms in a healthy manner.   Collaboration of Care: Medication Management AEB Tanika Lewis,NP  Patient/Guardian was advised Release of Information must be obtained prior to any record release in order to collaborate their care with an outside provider. Patient/Guardian was advised if they have not already done so to contact the registration department to sign all necessary forms in order for us  to release information regarding their care.   Consent: Patient/Guardian gives verbal consent for treatment and assignment of benefits for services provided during this visit. Patient/Guardian expressed understanding and agreed to proceed.   Diagnosis: Bipolar 2 disorder (HCC) [F31.81]    1. Bipolar 2 disorder (HCC)   2. Chronic post-traumatic stress disorder (PTSD)       [name], [facility] 07/03/2024

## 2024-07-06 ENCOUNTER — Ambulatory Visit (HOSPITAL_COMMUNITY)

## 2024-07-06 ENCOUNTER — Ambulatory Visit (HOSPITAL_COMMUNITY): Admitting: Licensed Clinical Social Worker

## 2024-07-06 DIAGNOSIS — F3181 Bipolar II disorder: Secondary | ICD-10-CM

## 2024-07-06 DIAGNOSIS — R4589 Other symptoms and signs involving emotional state: Secondary | ICD-10-CM

## 2024-07-06 DIAGNOSIS — F4310 Post-traumatic stress disorder, unspecified: Secondary | ICD-10-CM
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
## 2024-07-08 ENCOUNTER — Encounter (HOSPITAL_COMMUNITY): Payer: Self-pay

## 2024-07-08 NOTE — Therapy (Signed)
 [place] [facility] [address] [place], [place], [address] Phone: [phone]   Fax:  [phone]  Occupational Therapy Evaluation  Patient Details  Name: Lori Jensen MRN: [id number] Date of Birth: 1971/07/18 No data recorded  Encounter Date: 07/02/2024   OT End of Session - 07/08/24 2054     Visit Number 1    Number of Visits 15    Date for Recertification  07/20/24    OT Start Time 1230    OT Stop Time 1330    OT Time Calculation (min) 60 min    Activity Tolerance Patient tolerated treatment well    Behavior During Therapy [facility] for tasks assessed/performed          Past Medical History:  Diagnosis Date   Anxiety    Depression    Dysmenorrhea    Fibromyalgia 11/2017   Heart murmur    Hypertension    PONV (postoperative nausea and vomiting)    Uterine fibroid     Past Surgical History:  Procedure Laterality Date   DILATION AND CURETTAGE OF UTERUS     DILATION AND CURETTAGE OF UTERUS  01/16/2013   Procedure: DILATATION AND CURETTAGE;  Surgeon: Peggye Gull, MD;  Location: [facility];  Service: Gynecology;;   ENDOMETRIAL ABLATION     uterine ablation  08/13/2011    There were no vitals filed for this visit.   Subjective Assessment - 07/08/24 2051     Subjective  I need to learn and use coping skills for my anxiety and overthinking. I over think everything and can't start anything.    Pertinent History MDD, BPD2, PTSD    Patient Stated Goals improve my ability to do daily things with these barriers.    Currently in Pain? No/denies    Pain Score 0-No pain          OT Assessment OCAIRS Mental Health Interview Summary of Client Scores:  Facilitates participation in occupation Allows participation in occupation Inhibits participation in occupation Restricts participation in occupation Comments:  Roles   X    Habits   X    Personal Causation   X    Values  X     Interests    X   Skills   X    Short-Term Goals   X    Long-term  Goals   X    Interpretation of Past Experiences  X     Physical Environment  X     Social Environment  X     Readiness for Change   X      Need for Occupational Therapy:  4 Shows positive occupational participation, no need for OT.   3 Need for minimal intervention/consultative participation   2 Need for OT intervention indicated to restore/improve participation   1 Need for extensive OT intervention indicated to improve participation.  Referral for follow up services also recommended.   Assessment:  Patient demonstrates behavior that INHIBIT participation in occupation.  Patient will benefit from occupational therapy intervention in order to improve time management, financial management, stress management, job readiness skills, social skills, and health management skills in preparation to return to full time community living and to be a productive community member.    Plan:  Patient will participate in skilled occupational therapy sessions individually or in a group setting to improve coping skills, psychosocial skills, and emotional skills required to return to prior level of function. Treatment will be 4-5 times per week for 3  weeks.     Group Session:  O: During the group therapy session, the occupational therapist discussed the impact of sleep disturbances on daily activities and overall health and wellbeing.   The OT also reviewed various types of sleep disorders, including insomnia, sleep apnea, restless leg syndrome, and narcolepsy, and their associated symptoms. Strategies for managing and treating sleep disturbances were also discussed, such as establishing a consistent sleep routine, avoiding stimulants before bedtime, and engaging in relaxation techniques.   Today's group also included information on how sleep disturbances can cause fatigue, mood changes, cognitive impairment, and physical health problems, and emphasizes the importance of seeking prompt treatment to maintain  overall health and wellbeing.   A: In today's session, the patient demonstrated active engagement with the topic of The Importance of Sleep. They eagerly asked questions, contributed personal experiences, and showcased a noticeable eagerness to apply the discussed principles. Their participation indicated not only a strong understanding of the subject matter but also an intrinsic motivation to implement better sleep practices in their daily routine. Based on their proactive involvement, it is assessed that the patient greatly benefited from today's treatment and will likely make efforts to incorporate the insights gained.                      OT Education - 07/08/24 2053     Education Details OCAIRS / Tx: Sleep    Person(s) Educated Patient    Methods Explanation;Handout    Comprehension Verbalized understanding           OT Short Term Goals - 07/08/24 2058       OT SHORT TERM GOAL #1   Title Patient will be educated on strategies to improve psychosocial skills needed to participate fully in all daily, work, and leisure activities.    Time 3    Period Weeks    Status On-going    Target Date 07/20/24      OT SHORT TERM GOAL #2   Title Patient will be educated on a HEP and independent with implementation of HEP.    Status On-going      OT SHORT TERM GOAL #3   Title Patient will independently apply psychosocial skills and coping mechanisms to her daily activities in order to function independently.    Status On-going                   Plan - 07/08/24 2055     Clinical Impression Statement Pt presents w/ deficits across multiple psychosocial domains that inhibit participation in daily activities and overall occupational performance.    OT Occupational Profile and History Problem Focused Assessment - Including review of records relating to presenting problem    Occupational performance deficits (Please refer to evaluation for details): ADL's;IADL's;Rest  and Sleep;Work;Leisure;Social Participation    Psychosocial Skills Coping Strategies;Habits;Environmental  Adaptations;Interpersonal Interaction;Routines and Behaviors    Rehab Potential Good    Clinical Decision Making Limited treatment options, no task modification necessary    Comorbidities Affecting Occupational Performance: None    Modification or Assistance to Complete Evaluation  No modification of tasks or assist necessary to complete eval    OT Frequency 5x / week    OT Duration Other (comment)   3 weeks / 15 sessions   OT Treatment/Interventions Psychosocial skills training;Coping strategies training    Consulted and Agree with Plan of Care Patient          Patient will benefit from skilled therapeutic intervention  in order to improve the following deficits and impairments:       Psychosocial Skills: Coping Strategies, Habits, Environmental  Adaptations, Interpersonal Interaction, Routines and Behaviors   Visit Diagnosis: Bipolar 2 disorder (HCC)  Difficulty coping  Insomnia due to other mental disorder  Chronic post-traumatic stress disorder (PTSD)  Combined pyramidal-extrapyramidal syndrome    Problem List Patient Active Problem List   Diagnosis Date Noted   Bipolar 2 disorder (HCC) 04/07/2022   PTSD (post-traumatic stress disorder) 04/07/2022   Vulval cellulitis 03/25/2018   Vaginal discharge 03/25/2018   Candidiasis of mouth 03/25/2018   Abscess of vulva 03/25/2018   Migraine 12/03/2017   Neck fullness 09/21/2017   Irregular menstrual bleeding 11/10/2016   Moderate episode of recurrent major depressive disorder (HCC) 11/06/2015   Obesity (BMI 30-39.9) 11/03/2015   Myofascial pain 11/03/2015   Essential hypertension 11/03/2015   SMOKER 07/23/2009   PALPITATIONS 06/12/2009   PANIC ATTACKS 11/24/2006   DEPRESSIVE DISORDER, NOS 11/24/2006   MITRAL VALVE PROLAPSE 11/24/2006   CHEST PAIN 11/24/2006    Dallas KANDICE Purpura, OT 07/08/2024, 9:02 PM  Dallas Purpura, OT    [facility] [address] [place], [place], [address] Phone: [phone]   Fax:  [phone]  Name: SANDEE BERNATH MRN: [id number] Date of Birth: 10/22/1970

## 2024-07-09 ENCOUNTER — Ambulatory Visit (HOSPITAL_COMMUNITY)

## 2024-07-09 ENCOUNTER — Encounter (HOSPITAL_COMMUNITY): Payer: Self-pay

## 2024-07-09 NOTE — Therapy (Signed)
 [place] [facility] [address] [place], [place], [address] Phone: [phone]   Fax:  [phone]  Occupational Therapy Treatment  Patient Details  Name: Lori Jensen MRN: [id number] Date of Birth: Jan 30, 1971 No data recorded  Encounter Date: 07/03/2024   OT End of Session - 07/09/24 1227     Visit Number 2    Number of Visits 15    Date for Recertification  07/20/24    OT Start Time 1230    OT Stop Time 1330    OT Time Calculation (min) 60 min          Past Medical History:  Diagnosis Date   Anxiety    Depression    Dysmenorrhea    Fibromyalgia 11/2017   Heart murmur    Hypertension    PONV (postoperative nausea and vomiting)    Uterine fibroid     Past Surgical History:  Procedure Laterality Date   DILATION AND CURETTAGE OF UTERUS     DILATION AND CURETTAGE OF UTERUS  01/16/2013   Procedure: DILATATION AND CURETTAGE;  Surgeon: Peggye Gull, MD;  Location: [facility];  Service: Gynecology;;   ENDOMETRIAL ABLATION     uterine ablation  08/13/2011    There were no vitals filed for this visit.   Subjective Assessment - 07/09/24 1227     Currently in Pain? No/denies    Pain Score 0-No pain             Group Session:  S: Im still dealing w/ a lot of feelings. I did sleep a bit better last night by using some of the things we talked about yesterday.   O: During the group therapy session, the occupational therapist discussed the impact of sleep disturbances on daily activities and overall health and wellbeing.   The OT also reviewed various types of sleep disorders, including insomnia, sleep apnea, restless leg syndrome, and narcolepsy, and their associated symptoms. Strategies for managing and treating sleep disturbances were also discussed, such as establishing a consistent sleep routine, avoiding stimulants before bedtime, and engaging in relaxation techniques.   Today's group also included information on how sleep  disturbances can cause fatigue, mood changes, cognitive impairment, and physical health problems, and emphasizes the importance of seeking prompt treatment to maintain overall health and wellbeing.   A: In today's session, the patient demonstrated active engagement with the topic of The Importance of Sleep. They eagerly asked questions, contributed personal experiences, and showcased a noticeable eagerness to apply the discussed principles. Their participation indicated not only a strong understanding of the subject matter but also an intrinsic motivation to implement better sleep practices in their daily routine. Based on their proactive involvement, it is assessed that the patient greatly benefited from today's treatment and will likely make efforts to incorporate the insights gained.  P: Continue to attend PHP OT group sessions 5x week for 4 weeks to promote daily structure, social engagement, and opportunities to develop and utilize adaptive strategies to maximize functional performance in preparation for safe transition and integration back into school, work, and the community. Plan to address topic of tbd in next OT group session.                   OT Education - 07/09/24 1227     Education Details Sleep Hygiene           OT Short Term Goals - 07/08/24 2058       OT SHORT TERM GOAL #  1   Title Patient will be educated on strategies to improve psychosocial skills needed to participate fully in all daily, work, and leisure activities.    Time 3    Period Weeks    Status On-going    Target Date 07/20/24      OT SHORT TERM GOAL #2   Title Patient will be educated on a HEP and independent with implementation of HEP.    Status On-going      OT SHORT TERM GOAL #3   Title Patient will independently apply psychosocial skills and coping mechanisms to her daily activities in order to function independently.    Status On-going                   Plan - 07/09/24  1228     Psychosocial Skills Coping Strategies;Habits;Environmental  Adaptations;Interpersonal Interaction;Routines and Behaviors          Patient will benefit from skilled therapeutic intervention in order to improve the following deficits and impairments:       Psychosocial Skills: Coping Strategies, Habits, Environmental  Adaptations, Interpersonal Interaction, Routines and Behaviors   Visit Diagnosis: Difficulty coping    Problem List Patient Active Problem List   Diagnosis Date Noted   Bipolar 2 disorder (HCC) 04/07/2022   PTSD (post-traumatic stress disorder) 04/07/2022   Vulval cellulitis 03/25/2018   Vaginal discharge 03/25/2018   Candidiasis of mouth 03/25/2018   Abscess of vulva 03/25/2018   Migraine 12/03/2017   Neck fullness 09/21/2017   Irregular menstrual bleeding 11/10/2016   Moderate episode of recurrent major depressive disorder (HCC) 11/06/2015   Obesity (BMI 30-39.9) 11/03/2015   Myofascial pain 11/03/2015   Essential hypertension 11/03/2015   SMOKER 07/23/2009   PALPITATIONS 06/12/2009   PANIC ATTACKS 11/24/2006   DEPRESSIVE DISORDER, NOS 11/24/2006   MITRAL VALVE PROLAPSE 11/24/2006   CHEST PAIN 11/24/2006    Dallas KANDICE Purpura, OT 07/09/2024, 12:28 PM  Dallas Purpura, OT   [place] [facility] [address] [place], [place], [address] Phone: [phone]   Fax:  [phone]  Name: DEIRDRE GRYDER MRN: [id number] Date of Birth: 17-Nov-1970

## 2024-07-10 ENCOUNTER — Ambulatory Visit (INDEPENDENT_AMBULATORY_CARE_PROVIDER_SITE_OTHER): Admitting: Licensed Clinical Social Worker

## 2024-07-10 ENCOUNTER — Encounter (HOSPITAL_COMMUNITY): Payer: Self-pay | Admitting: Licensed Clinical Social Worker

## 2024-07-10 ENCOUNTER — Ambulatory Visit (HOSPITAL_COMMUNITY)

## 2024-07-10 DIAGNOSIS — F4310 Post-traumatic stress disorder, unspecified: Secondary | ICD-10-CM

## 2024-07-10 DIAGNOSIS — F3181 Bipolar II disorder: Secondary | ICD-10-CM | POA: Diagnosis not present

## 2024-07-10 DIAGNOSIS — R4589 Other symptoms and signs involving emotional state: Secondary | ICD-10-CM
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
## 2024-07-11 ENCOUNTER — Ambulatory Visit (HOSPITAL_COMMUNITY)
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
## 2024-07-12 ENCOUNTER — Ambulatory Visit (INDEPENDENT_AMBULATORY_CARE_PROVIDER_SITE_OTHER): Admitting: Licensed Clinical Social Worker

## 2024-07-12 ENCOUNTER — Ambulatory Visit (HOSPITAL_COMMUNITY)

## 2024-07-12 DIAGNOSIS — F3181 Bipolar II disorder: Secondary | ICD-10-CM | POA: Diagnosis not present

## 2024-07-12 DIAGNOSIS — F4310 Post-traumatic stress disorder, unspecified: Secondary | ICD-10-CM

## 2024-07-13 ENCOUNTER — Ambulatory Visit (HOSPITAL_COMMUNITY)

## 2024-07-15 ENCOUNTER — Encounter (HOSPITAL_COMMUNITY): Payer: Self-pay

## 2024-07-15 NOTE — Therapy (Signed)
 [facility] [address] [place], [place], [address] Phone: [phone]   Fax:  [phone]  Occupational Therapy Treatment  Patient Details  Name: Lori Jensen MRN: [id number] Date of Birth: 05/02/1971 No data recorded  Encounter Date: 07/06/2024   OT End of Session - 07/15/24 2328     Visit Number 5    Number of Visits 15    Date for Recertification  07/20/24    OT Start Time 1200    OT Stop Time 1250    OT Time Calculation (min) 50 min          Past Medical History:  Diagnosis Date   Anxiety    Bipolar disorder (HCC)    Depression    Dysmenorrhea    Fibromyalgia 11/2017   Heart murmur    Hypertension    PONV (postoperative nausea and vomiting)    PTSD (post-traumatic stress disorder)    Uterine fibroid     Past Surgical History:  Procedure Laterality Date   DILATION AND CURETTAGE OF UTERUS     DILATION AND CURETTAGE OF UTERUS  01/16/2013   Procedure: DILATATION AND CURETTAGE;  Surgeon: Peggye Gull, MD;  Location: [facility];  Service: Gynecology;;   ENDOMETRIAL ABLATION     uterine ablation  08/13/2011    There were no vitals filed for this visit.   Subjective Assessment - 07/15/24 2328     Currently in Pain? No/denies    Pain Score 0-No pain            Group Session:  S: I'm better today.   O: Group session focused on identification and application of grounding strategies to support emotional regulation and reduce cognitive distress. Discussion covered sensory, cognitive, and movement-based techniques for shifting attention from intrusive thoughts or heightened emotional states to present-moment awareness. Patients practiced guided grounding activities and reflected on situations in which these techniques may assist with managing anxiety, rumination, or dissociative symptoms. Group emphasized skill generalization for improved daily functioning and coping.   A: Patient was attentive and actively engaged in group  discussion and activities. Demonstrated insight, shared personal experiences, and contributed appropriately to peer dialogue. Able to identify relevant grounding strategies for use in daily situations.    P: Continue to attend PHP OT group sessions 5x week for 4 weeks to promote daily structure, social engagement, and opportunities to develop and utilize adaptive strategies to maximize functional performance in preparation for safe transition and integration back into school, work, and the community. Plan to address topic of tbd in next OT group session.                    OT Education - 07/15/24 2328     Education Details Grounding Techniques           OT Short Term Goals - 07/08/24 2058       OT SHORT TERM GOAL #1   Title Patient will be educated on strategies to improve psychosocial skills needed to participate fully in all daily, work, and leisure activities.    Time 3    Period Weeks    Status On-going    Target Date 07/20/24      OT SHORT TERM GOAL #2   Title Patient will be educated on a HEP and independent with implementation of HEP.    Status On-going      OT SHORT TERM GOAL #3   Title Patient will independently apply psychosocial skills and coping  mechanisms to her daily activities in order to function independently.    Status On-going                   Plan - 07/15/24 2328     Psychosocial Skills Coping Strategies;Habits;Environmental  Adaptations;Interpersonal Interaction;Routines and Behaviors          Patient will benefit from skilled therapeutic intervention in order to improve the following deficits and impairments:       Psychosocial Skills: Coping Strategies, Habits, Environmental  Adaptations, Interpersonal Interaction, Routines and Behaviors   Visit Diagnosis: Difficulty coping    Problem List Patient Active Problem List   Diagnosis Date Noted   Bipolar 2 disorder (HCC) 04/07/2022   PTSD (post-traumatic stress  disorder) 04/07/2022   Vulval cellulitis 03/25/2018   Vaginal discharge 03/25/2018   Candidiasis of mouth 03/25/2018   Abscess of vulva 03/25/2018   Migraine 12/03/2017   Neck fullness 09/21/2017   Irregular menstrual bleeding 11/10/2016   Moderate episode of recurrent major depressive disorder (HCC) 11/06/2015   Obesity (BMI 30-39.9) 11/03/2015   Myofascial pain 11/03/2015   Essential hypertension 11/03/2015   SMOKER 07/23/2009   PALPITATIONS 06/12/2009   PANIC ATTACKS 11/24/2006   DEPRESSIVE DISORDER, NOS 11/24/2006   MITRAL VALVE PROLAPSE 11/24/2006   CHEST PAIN 11/24/2006    [place] KANDICE Purpura, OT 07/15/2024, 11:29 PM  D[place] Purpura, OT   [facility] [address] [place], [place], [address] Phone: [phone]   Fax:  [phone]  Name: Lori Jensen MRN: [id number] Date of Birth: 04-Jul-1971

## 2024-07-15 NOTE — Therapy (Signed)
 [facility] [address] [place], [place], [address] Phone: [phone]   Fax:  [phone]  Occupational Therapy Treatment  Patient Details  Name: Lori Jensen MRN: [id number] Date of Birth: 01-24-1971 No data recorded  Encounter Date: 07/05/2024   OT End of Session - 07/15/24 2224     Visit Number 4    Number of Visits 15    Date for Recertification  07/20/24    OT Start Time 1230    OT Stop Time 1330    OT Time Calculation (min) 60 min          Past Medical History:  Diagnosis Date   Anxiety    Bipolar disorder (HCC)    Depression    Dysmenorrhea    Fibromyalgia 11/2017   Heart murmur    Hypertension    PONV (postoperative nausea and vomiting)    PTSD (post-traumatic stress disorder)    Uterine fibroid     Past Surgical History:  Procedure Laterality Date   DILATION AND CURETTAGE OF UTERUS     DILATION AND CURETTAGE OF UTERUS  01/16/2013   Procedure: DILATATION AND CURETTAGE;  Surgeon: Peggye Gull, MD;  Location: [facility];  Service: Gynecology;;   ENDOMETRIAL ABLATION     uterine ablation  08/13/2011    There were no vitals filed for this visit.   Subjective Assessment - 07/15/24 2224     Currently in Pain? No/denies    Pain Score 0-No pain                Group Session:  S: Feeling better today  O: During the group therapy session, the occupational therapist discussed the impact of sleep disturbances on daily activities and overall health and wellbeing.   The OT also reviewed various types of sleep disorders, including insomnia, sleep apnea, restless leg syndrome, and narcolepsy, and their associated symptoms. Strategies for managing and treating sleep disturbances were also discussed, such as establishing a consistent sleep routine, avoiding stimulants before bedtime, and engaging in relaxation techniques.   Today's group also included information on how sleep disturbances can cause fatigue, mood changes,  cognitive impairment, and physical health problems, and emphasizes the importance of seeking prompt treatment to maintain overall health and wellbeing.   A: In today's session, the patient demonstrated active engagement with the topic of The Importance of Sleep. They eagerly asked questions, contributed personal experiences, and showcased a noticeable eagerness to apply the discussed principles. Their participation indicated not only a strong understanding of the subject matter but also an intrinsic motivation to implement better sleep practices in their daily routine. Based on their proactive involvement, it is assessed that the patient greatly benefited from today's treatment and will likely make efforts to incorporate the insights gained.   P: Continue to attend PHP OT group sessions 5x week for 4 weeks to promote daily structure, social engagement, and opportunities to develop and utilize adaptive strategies to maximize functional performance in preparation for safe transition and integration back into school, work, and the community. Plan to address topic of tbd in next OT group session.                OT Education - 07/15/24 2224     Education Details Sleep Hygiene           OT Short Term Goals - 07/08/24 2058       OT SHORT TERM GOAL #1   Title Patient will be educated on strategies  to improve psychosocial skills needed to participate fully in all daily, work, and leisure activities.    Time 3    Period Weeks    Status On-going    Target Date 07/20/24      OT SHORT TERM GOAL #2   Title Patient will be educated on a HEP and independent with implementation of HEP.    Status On-going      OT SHORT TERM GOAL #3   Title Patient will independently apply psychosocial skills and coping mechanisms to her daily activities in order to function independently.    Status On-going                   Plan - 07/15/24 2224     Psychosocial Skills Coping  Strategies;Habits;Environmental  Adaptations;Interpersonal Interaction;Routines and Behaviors          Patient will benefit from skilled therapeutic intervention in order to improve the following deficits and impairments:       Psychosocial Skills: Coping Strategies, Habits, Environmental  Adaptations, Interpersonal Interaction, Routines and Behaviors   Visit Diagnosis: Difficulty coping    Problem List Patient Active Problem List   Diagnosis Date Noted   Bipolar 2 disorder (HCC) 04/07/2022   PTSD (post-traumatic stress disorder) 04/07/2022   Vulval cellulitis 03/25/2018   Vaginal discharge 03/25/2018   Candidiasis of mouth 03/25/2018   Abscess of vulva 03/25/2018   Migraine 12/03/2017   Neck fullness 09/21/2017   Irregular menstrual bleeding 11/10/2016   Moderate episode of recurrent major depressive disorder (HCC) 11/06/2015   Obesity (BMI 30-39.9) 11/03/2015   Myofascial pain 11/03/2015   Essential hypertension 11/03/2015   SMOKER 07/23/2009   PALPITATIONS 06/12/2009   PANIC ATTACKS 11/24/2006   DEPRESSIVE DISORDER, NOS 11/24/2006   MITRAL VALVE PROLAPSE 11/24/2006   CHEST PAIN 11/24/2006    Dallas KANDICE Purpura, OT 07/15/2024, 10:25 PM  Dallas Purpura, OT   [facility] [address] [place], [place], [address] Phone: [phone]   Fax:  [phone]  Name: Lori Jensen MRN: [id number] Date of Birth: 1971-01-15

## 2024-07-15 NOTE — Therapy (Signed)
 [facility] [address] [place], [place], [address] Phone: [phone]   Fax:  [phone]  Occupational Therapy Treatment  Patient Details  Name: Lori Jensen MRN: [id number] Date of Birth: 01-28-1971 No data recorded  Encounter Date: 07/04/2024   OT End of Session - 07/15/24 2159     Visit Number 3    Number of Visits 15    Date for Recertification  07/20/24    OT Start Time 1230    OT Stop Time 1330    OT Time Calculation (min) 60 min          Past Medical History:  Diagnosis Date   Anxiety    Bipolar disorder (HCC)    Depression    Dysmenorrhea    Fibromyalgia 11/2017   Heart murmur    Hypertension    PONV (postoperative nausea and vomiting)    PTSD (post-traumatic stress disorder)    Uterine fibroid     Past Surgical History:  Procedure Laterality Date   DILATION AND CURETTAGE OF UTERUS     DILATION AND CURETTAGE OF UTERUS  01/16/2013   Procedure: DILATATION AND CURETTAGE;  Surgeon: Peggye Gull, MD;  Location: [facility];  Service: Gynecology;;   ENDOMETRIAL ABLATION     uterine ablation  08/13/2011    There were no vitals filed for this visit.   Subjective Assessment - 07/15/24 2159     Currently in Pain? No/denies    Pain Score 0-No pain             Group Session:  S: Doing a bit better today  O: During the group therapy session, the occupational therapist discussed the impact of sleep disturbances on daily activities and overall health and wellbeing.   The OT also reviewed various types of sleep disorders, including insomnia, sleep apnea, restless leg syndrome, and narcolepsy, and their associated symptoms. Strategies for managing and treating sleep disturbances were also discussed, such as establishing a consistent sleep routine, avoiding stimulants before bedtime, and engaging in relaxation techniques.   Today's group also included information on how sleep disturbances can cause fatigue, mood changes,  cognitive impairment, and physical health problems, and emphasizes the importance of seeking prompt treatment to maintain overall health and wellbeing.   A: In today's session, the patient demonstrated active engagement with the topic of The Importance of Sleep. They eagerly asked questions, contributed personal experiences, and showcased a noticeable eagerness to apply the discussed principles. Their participation indicated not only a strong understanding of the subject matter but also an intrinsic motivation to implement better sleep practices in their daily routine. Based on their proactive involvement, it is assessed that the patient greatly benefited from today's treatment and will likely make efforts to incorporate the insights gained.   P: Continue to attend PHP OT group sessions 5x week for 4 weeks to promote daily structure, social engagement, and opportunities to develop and utilize adaptive strategies to maximize functional performance in preparation for safe transition and integration back into school, work, and the community. Plan to address topic of tbd in next OT group session.                   OT Education - 07/15/24 2159     Education Details Sleep Hygiene           OT Short Term Goals - 07/08/24 2058       OT SHORT TERM GOAL #1   Title Patient will be educated  on strategies to improve psychosocial skills needed to participate fully in all daily, work, and leisure activities.    Time 3    Period Weeks    Status On-going    Target Date 07/20/24      OT SHORT TERM GOAL #2   Title Patient will be educated on a HEP and independent with implementation of HEP.    Status On-going      OT SHORT TERM GOAL #3   Title Patient will independently apply psychosocial skills and coping mechanisms to her daily activities in order to function independently.    Status On-going                   Plan - 07/15/24 2159     Psychosocial Skills Coping  Strategies;Habits;Environmental  Adaptations;Interpersonal Interaction;Routines and Behaviors          Patient will benefit from skilled therapeutic intervention in order to improve the following deficits and impairments:       Psychosocial Skills: Coping Strategies, Habits, Environmental  Adaptations, Interpersonal Interaction, Routines and Behaviors   Visit Diagnosis: Difficulty coping    Problem List Patient Active Problem List   Diagnosis Date Noted   Bipolar 2 disorder (HCC) 04/07/2022   PTSD (post-traumatic stress disorder) 04/07/2022   Vulval cellulitis 03/25/2018   Vaginal discharge 03/25/2018   Candidiasis of mouth 03/25/2018   Abscess of vulva 03/25/2018   Migraine 12/03/2017   Neck fullness 09/21/2017   Irregular menstrual bleeding 11/10/2016   Moderate episode of recurrent major depressive disorder (HCC) 11/06/2015   Obesity (BMI 30-39.9) 11/03/2015   Myofascial pain 11/03/2015   Essential hypertension 11/03/2015   SMOKER 07/23/2009   PALPITATIONS 06/12/2009   PANIC ATTACKS 11/24/2006   DEPRESSIVE DISORDER, NOS 11/24/2006   MITRAL VALVE PROLAPSE 11/24/2006   CHEST PAIN 11/24/2006    Dallas KANDICE Purpura, OT 07/15/2024, 10:00 PM  Dallas Purpura, OT   [facility] [address] [place], [place], [address] Phone: [phone]   Fax:  [phone]  Name: Lori Jensen MRN: [id number] Date of Birth: [birth year]

## 2024-07-16 ENCOUNTER — Ambulatory Visit (INDEPENDENT_AMBULATORY_CARE_PROVIDER_SITE_OTHER): Admitting: Licensed Clinical Social Worker

## 2024-07-16 DIAGNOSIS — F4310 Post-traumatic stress disorder, unspecified: Secondary | ICD-10-CM

## 2024-07-16 DIAGNOSIS — F3181 Bipolar II disorder: Secondary | ICD-10-CM | POA: Diagnosis not present
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
## 2024-07-17 ENCOUNTER — Ambulatory Visit (INDEPENDENT_AMBULATORY_CARE_PROVIDER_SITE_OTHER): Admitting: Licensed Clinical Social Worker

## 2024-07-17 DIAGNOSIS — F3181 Bipolar II disorder: Secondary | ICD-10-CM

## 2024-07-17 DIAGNOSIS — F4310 Post-traumatic stress disorder, unspecified: Secondary | ICD-10-CM

## 2024-07-17 NOTE — Psych (Signed)
 [facility] PHP THERAPIST PROGRESS NOTE  DERRIONA BRANSCOM [id number]   Session Time: 9:00 am - 10:00 am  Participation Level: Active  Behavioral Response: CasualAlertAnxious and Depressed  Type of Therapy: Group Therapy  Treatment Goals addressed: Coping  Progress Towards Goals: Progressing  Interventions: CBT, DBT, Solution Focused, Strength-based, Supportive, and Reframing  Therapist Response: Clinician led check-in regarding current stressors and situation, and review of patient completed daily inventory. Clinician utilized active listening and empathetic response and validated patient emotions. Clinician facilitated processing group on pertinent issues.?   Summary: Patient arrived within time allowed. Patient rates their depression at a 7 and anxiety at a 9 on a scale of 1-10 with 10 being best. Pt reports her appt with the spine specialist went well and pt is scheduled for a nerve block in December and will be starting physical therapy in November. She shares her fears related to the procedure and discusses how chronic pain has impacted her mental health. When asked about sleep and appetite, pt reports they slept 7-8 hours last night and ate 5 meals/snacks yesterday. Pt denied experiencing SI/SH thoughts and continues to endorse feelings of hopelessness since last session. Pt able to process.?Pt engaged in discussion.?      Session Time: 10:00 am - 11:00 am  Participation Level: Active  Behavioral Response: CasualAlertAnxious and Depressed  Type of Therapy: Group Therapy  Treatment Goals addressed: Coping  Progress Towards Goals: Progressing  Interventions: CBT, DBT, Solution Focused, Strength-based, Supportive, and Reframing  Therapist Response: Clinician led processing group for pt's current struggles. Group members shared stressors and provided support and feedback. Clinician brought in topics of embracing anxiety to inform discussion.  Summary: Pt able to process and  provide support to group.     Session Time: 11:00 am - 12:00 pm  Participation Level: Active  Behavioral Response: CasualAlertAnxious and Depressed  Type of Therapy: Group Therapy  Treatment Goals addressed: Coping  Progress Towards Goals: Progressing  Interventions: CBT, DBT, Solution Focused, Strength-based, Supportive, and Reframing  Therapist Response: Clinician led group on anxiety and experiential avoidance. Patients were asked to identify 1-3 things they avoid due to anxiety, and the reasons behind the avoidance were explored. Clinician defined experiential avoidance and described how in attempting to avoid pain and discomfort, we avoid enriching experiences and experience discomfort and pain anyway. Clinician utilized ACT principles to inform discussion.  Summary: Pt engaged in discussion. Pt demonstrates good insight into the subject matter.   Session Time: 12:00 pm - 12:30 pm  Participation Level: Active  Behavioral Response: CasualAlertAnxious and Depressed  Type of Therapy: Group Therapy  Treatment Goals addressed: Coping  Progress Towards Goals: Progressing  Interventions: Psychologist, occupational, Supportive  Therapist Response: Reflection Group: Patients encouraged to practice skills and interpersonal techniques or work on mindfulness and relaxation techniques. The importance of self-care and making skills part of a routine to increase usage were stressed.  Summary: Patient engaged and participated appropriately.   Session Time: 12:30 pm - 1:30 pm  Participation Level: Active  Behavioral Response: CasualAlertAnxious and Depressed  Type of Therapy: Group Therapy  Treatment Goals addressed: Coping  Progress Towards Goals: Progressing  Interventions: CBT, DBT, Solution Focused, Strength-based, Supportive, and Reframing  Therapist Response: Group was led by occupational therapist, Edward Hollan.   Summary: Pt engaged and participated in  discussion.   Session Time: 1:30 pm - 2:00 pm  Participation Level: Active  Behavioral Response: CasualAlertAnxious and Depressed  Type of Therapy: Group Therapy  Treatment Goals addressed:  Coping  Progress Towards Goals: Progressing  Interventions: CBT, DBT, Solution Focused, Strength-based, Supportive, and Reframing  Therapist Response: 1:30 pm - 1:50 pm: Clinician utilized a guided meditation video led by yoga instructor Adriene of [employer] that focuses on self-love. Patients were invited to share their responses upon completion. 1:50 - 2:00 pm: Clinician led check-out. Clinician assessed for immediate needs, medication compliance and efficacy, and safety concerns?  Summary: 1:30 pm - 1:50 pm: Pt participated in activity. 1:50 - 2:00 pm: At check-out, patient contracts for safety.?Patient demonstrates progress as evidenced by her continued engagement and by being receptive to treatment. Patient denies SI/HI/self-harm thoughts at the end of group and agrees to seek help should those thoughts/feelings occur.?    Suicidal/Homicidal: Nowithout intent/plan  Plan: ?Pt will continue in PHP and medication management while continuing to work on decreasing depression symptoms,?SI, and anxiety symptoms,?and increasing the ability to self manage symptoms.    Collaboration of Care: Other RN Hildegard Macadam  Patient/Guardian was advised Release of Information must be obtained prior to any record release in order to collaborate their care with an outside provider. Patient/Guardian was advised if they have not already done so to contact the registration department to sign all necessary forms in order for us  to release information regarding their care.   Consent: Patient/Guardian gives verbal consent for treatment and assignment of benefits for services provided during this visit. Patient/Guardian expressed understanding and agreed to proceed.   Diagnosis: Bipolar 2 disorder (HCC)  [F31.81]    1. Bipolar 2 disorder (HCC)   2. PTSD (post-traumatic stress disorder)       Will [name], LCSW 07/17/2024
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
## 2024-07-18 ENCOUNTER — Ambulatory Visit (HOSPITAL_COMMUNITY): Admitting: Licensed Clinical Social Worker

## 2024-07-18 DIAGNOSIS — R4589 Other symptoms and signs involving emotional state: Secondary | ICD-10-CM

## 2024-07-18 DIAGNOSIS — F3181 Bipolar II disorder: Secondary | ICD-10-CM | POA: Diagnosis not present

## 2024-07-18 MED ORDER — TOPIRAMATE 25 MG PO TABS: 25.0000 mg | ORAL_TABLET | Freq: Every day | ORAL | 0 refills | Status: DC

## 2024-07-18 NOTE — Progress Notes (Signed)
 [facility] MD/PA/NP OP Progress Note  07/18/2024 12:43 PM Lori Jensen  MRN:  [id number]  Chief Complaint: Weekly progress assessment note  HPI: Lori Jensen 53 year old female presents for weekly progress assessment while attending partial hospitalization programming.  Currently she is denying suicidal or homicidal ideations.  Denies auditory visual hallucinations.  Reports she has been compliant with medication denying any medication side effects.  States she recently discontinued Zepbound weight loss medication due to increased suicidal ideations.  Discussed consideration for mood stabilization medication, after chart review patient's unable to take lithium , Lamictal , Tegretol, or carbamazepine due to documented side effects related to rash or anaphylaxis.  currently she is already taking gabapentin  600 mg 3 times daily.  Discussed initiating Depakote  and/or Topamax she was amendable to low-dose Topamax 25 mg with plan titration.   Lori Jensen reports overall group setting has been helpful for structure and additional coping skills.   Working on Teacher, adult education reports a good appetite.  States she is resting well throughout the night.  Unsure if she wants to follow-up with intensive outpatient programming.  States she misses individual therapy.  Will continue to monitor symptoms.  Support,  encouragement  and reassurance was provided.  Visit Diagnosis:    ICD-10-CM   1. Bipolar 2 disorder (HCC)  F31.81     2. Difficulty coping  R45.89       Past Psychiatric History:   Past Medical History:  Past Medical History:  Diagnosis Date   Anxiety    Bipolar disorder (HCC)    Depression    Dysmenorrhea    Fibromyalgia 11/2017   Heart murmur    Hypertension    PONV (postoperative nausea and vomiting)    PTSD (post-traumatic stress disorder)    Uterine fibroid     Past Surgical History:  Procedure Laterality Date   DILATION AND CURETTAGE OF UTERUS     DILATION AND CURETTAGE OF UTERUS   01/16/2013   Procedure: DILATATION AND CURETTAGE;  Surgeon: Peggye Gull, MD;  Location: [facility];  Service: Gynecology;;   ENDOMETRIAL ABLATION     uterine ablation  08/13/2011    Family Psychiatric History:   Family History:  Family History  Problem Relation Age of Onset   Heart disease Mother    Anxiety disorder Mother    Depression Mother    Stomach cancer Father    Heart disease Father    Other Father        non hodgkins lymphoma   Alcohol abuse Maternal Aunt    Drug abuse Maternal Aunt    Alcohol abuse Brother    Alcohol abuse Maternal Uncle    Alcohol abuse Paternal Uncle    Colon cancer Neg Hx     Social History:  Social History   Socioeconomic History   Marital status: Divorced    Spouse name: Not on file   Number of children: 2   Years of education: Not on file   Highest education level: GED or equivalent  Occupational History   Occupation: MORTGAGE Paramedic: [employer]  Tobacco Use   Smoking status: Former    Current packs/day: 0.00    Types: Cigarettes, E-cigarettes    Start date: 08/13/1996    Quit date: 08/13/2016    Years since quitting: 7.9   Smokeless tobacco: Never   Tobacco comments:    Reports she does Vap occasionally   Vaping Use   Vaping status: Every Day   Start date: 03/27/2018   Substances: Nicotine,  Flavoring   Devices: njoy  Substance and Sexual Activity   Alcohol use: Not Currently    Comment: occasionally   Drug use: No   Sexual activity: Yes    Partners: Male    Birth control/protection: None, Surgical  Other Topics Concern   Not on file  Social History Narrative   Reports at times has been emotionally mistreated by partner but denies any other issues.    Social Drivers of Health   Financial Resource Strain: Medium Risk (05/18/2024)   Received from [facility]   Overall Financial Resource Strain (CARDIA)    How hard is it for you to pay for the very basics like food, housing, medical care, and  heating?: Somewhat hard  Food Insecurity: Food Insecurity Present (05/18/2024)   Received from [facility]   Hunger Vital Sign    Within the past 12 months, you worried that your food would run out before you got the money to buy more.: Often true    Within the past 12 months, the food you bought just didn't last and you didn't have money to get more.: Often true  Transportation Needs: No Transportation Needs (05/18/2024)   Received from [facility] - Transportation    In the past 12 months, has lack of transportation kept you from medical appointments or from getting medications?: No    In the past 12 months, has lack of transportation kept you from meetings, work, or from getting things needed for daily living?: No  Physical Activity: Insufficiently Active (05/18/2024)   Received from [facility]   Exercise Vital Sign    On average, how many days per week do you engage in moderate to strenuous exercise (like a brisk walk)?: 2 days    On average, how many minutes do you engage in exercise at this level?: 10 min  Stress: Stress Concern Present (05/18/2024)   Received from [facility] - Occupational Stress Questionnaire    Do you feel stress - tense, restless, nervous, or anxious, or unable to sleep at night because your mind is troubled all the time - these days?: Very much  Social Connections: Somewhat Isolated (05/18/2024)   Received from [facility]   Social Network    How would you rate your social network (family, work, friends)?: Restricted participation with some degree of social isolation    Allergies:  Allergies  Allergen Reactions   Codeine Anaphylaxis   Meloxicam  Other (See Comments)    dyspepsia   Amoxicillin Rash    Has patient had a PCN reaction causing immediate rash, facial/tongue/throat swelling, SOB or lightheadedness with hypotension: no Has patient had a PCN reaction causing severe rash involving mucus  membranes or skin necrosis: no Has patient had a PCN reaction that required hospitalization no Has patient had a PCN reaction occurring within the last 10 years: yes If all of the above answers are NO, then may proceed with Cephalosporin use.    Doxycycline Rash   Lamictal  [Lamotrigine ] Rash    Metabolic Disorder Labs: No results found for: HGBA1C, MPG No results found for: PROLACTIN No results found for: CHOL, TRIG, HDL, CHOLHDL, VLDL, LDLCALC No results found for: TSH  Therapeutic Level Labs: No results found for: LITHIUM  No results found for: VALPROATE No results found for: CBMZ  Current Medications: Current Outpatient Medications  Medication Sig Dispense Refill   topiramate (TOPAMAX) 25 MG tablet Take 1 tablet (25 mg total) by mouth daily.  30 tablet 0   amitriptyline (ELAVIL) 25 MG tablet Take 25 mg by mouth at bedtime. (Patient not taking: Reported on 07/10/2024)     buPROPion  (WELLBUTRIN  XL) 300 MG 24 hr tablet Take 1 tablet (300 mg total) by mouth daily. 90 tablet 0   celecoxib (CELEBREX) 100 MG capsule Take 100 mg by mouth.     Cholecalciferol 1.25 MG (50000 UT) capsule Take 1 capsule by mouth once a week. (Patient not taking: Reported on 07/10/2024)     clonazePAM  (KLONOPIN ) 1 MG tablet Take 1 tablet (1 mg total) by mouth 2 (two) times daily as needed for anxiety. 30 tablet 2   diclofenac Sodium (VOLTAREN) 1 % GEL Apply topically.     fluticasone (FLONASE) 50 MCG/ACT nasal spray Place 1 spray into both nostrils daily.     gabapentin  (NEURONTIN ) 600 MG tablet Take 1 tablet (600 mg total) by mouth 3 (three) times daily. 90 tablet 2   hydrochlorothiazide (HYDRODIURIL) 12.5 MG tablet Take 12.5 mg by mouth daily.     hydrocortisone (ANUSOL-HC) 2.5 % rectal cream Apply rectally 2 times daily     methocarbamol  (ROBAXIN ) 500 MG tablet Take 500 mg by mouth every 6 (six) hours as needed.     ondansetron  (ZOFRAN ) 4 MG tablet Take 4 mg by mouth every 8  (eight) hours as needed for nausea.     pantoprazole  (PROTONIX ) 20 MG tablet Take 1 tablet (20 mg total) by mouth 2 (two) times daily. (Patient not taking: Reported on 07/10/2024) 30 tablet 0   potassium chloride  (KLOR-CON ) 10 MEQ tablet Take 1 tablet (10 mEq total) by mouth daily for 30 doses. (Patient not taking: Reported on 07/10/2024) 30 tablet 0   PROAIR  HFA 108 (90 Base) MCG/ACT inhaler Inhale 1-2 puffs into the lungs every 6 (six) hours as needed for wheezing or shortness of breath.  2   tirzepatide (ZEPBOUND) 5 MG/0.5ML Pen Inject 5 mg into the skin once a week. (Patient not taking: Reported on 07/10/2024)     No current facility-administered medications for this visit.     Musculoskeletal: Strength & Muscle Tone: within normal limits Gait & Station: normal Patient leans: N/A  Psychiatric Specialty Exam: Review of Systems  There were no vitals taken for this visit.There is no height or weight on file to calculate BMI.  General Appearance: Casual  Eye Contact:  Good  Speech:  Clear and Coherent  Volume:  Normal  Mood:  Anxious and Depressed  Affect:  Congruent  Thought Process:  Coherent  Orientation:  Full (Time, Place, and Person)  Thought Content: Logical   Suicidal Thoughts:  No  Homicidal Thoughts:  No  Memory:  Immediate;   Good Recent;   Good  Judgement:  Good  Insight:  Good  Psychomotor Activity:  Normal  Concentration:  Concentration: Good  Recall:  Good  Fund of Knowledge: Good  Language: Good  Akathisia:  No  Handed:  Right  AIMS (if indicated): not done  Assets:  Communication Skills Desire for Improvement Resilience Social Support  ADL's:  Intact  Cognition: WNL  Sleep:  Good   Screenings: GAD-7    Advertising copywriter from 07/16/2024 in [facility] Counselor from 10/04/2017 in [facility] Counselor from 09/08/2017 in [facility]ITAL[facility] PROGRAM Counselor  from 03/04/2017 in [facility] PROGRAM Counselor from 02/22/2017 in BE[facility]PITAL[facility]ATION PROGRAM  Total GAD-7 Score 16 17 19 12 20    PHQ2-9  Flowsheet Row Counselor from 07/16/2024 in [facility] Counselor from 07/10/2024 in [facility] Counselor from 06/28/2024 in [facility] Video Visit from 02/17/2023 in [facility] Video Visit from [facility]2022 [facility]  PHQ-2 Total Score 6 4 4 5 4   PHQ-9 Total Score 21 18 18 19 17    Flowsheet Row Counselor from 06/28/2024 in [facility] from 02[facility]12/2024 in [facility] Video Visit from 02[facility]17/2023 [facility]  C-SSRS RISK CATEGORY High Risk Error: Question 6 not populated No Risk     Assessment and Plan:  Continue partial outpatient programming (PHP) Continue Wellbutrin  300 mg daily Initiated Topamax 25 mg daily (consider titration) Patient reported discontinuing Zepbound Continue gabapentin  600 mg p.o. 3 times daily   Collaboration of Care: Collaboration of Care: Medication Management AEB initiated Topamax 25 mg daily for mood stabilization  Patient/Guardian was advised Release of Information must be obtained prior to any record release in order to collaborate their care with an outside provider. Patient/Guardian was advised if they have not already done so to contact the registration department to sign all necessary forms in order for us  to release information regarding their care.   Consent: Patient/Guardian gives verbal consent for treatment and assignment of benefits for services provided during this visit. Patient/Guardian expressed understanding and agreed to proceed.    Staci LOISE Kerns, NP 07/18/2024, 12:43 PM

## 2024-07-19 ENCOUNTER — Other Ambulatory Visit (HOSPITAL_COMMUNITY): Payer: Self-pay | Admitting: Psychiatry

## 2024-07-19 ENCOUNTER — Ambulatory Visit (INDEPENDENT_AMBULATORY_CARE_PROVIDER_SITE_OTHER): Admitting: Licensed Clinical Social Worker

## 2024-07-19 DIAGNOSIS — F5105 Insomnia due to other mental disorder: Secondary | ICD-10-CM

## 2024-07-19 DIAGNOSIS — F3181 Bipolar II disorder: Secondary | ICD-10-CM | POA: Diagnosis not present

## 2024-07-19 DIAGNOSIS — F4310 Post-traumatic stress disorder, unspecified: Secondary | ICD-10-CM

## 2024-07-19 DIAGNOSIS — F4312 Post-traumatic stress disorder, chronic: Secondary | ICD-10-CM

## 2024-07-20 ENCOUNTER — Ambulatory Visit (INDEPENDENT_AMBULATORY_CARE_PROVIDER_SITE_OTHER): Admitting: Licensed Clinical Social Worker

## 2024-07-20 DIAGNOSIS — F4310 Post-traumatic stress disorder, unspecified: Secondary | ICD-10-CM

## 2024-07-20 DIAGNOSIS — F3181 Bipolar II disorder: Secondary | ICD-10-CM | POA: Diagnosis not present

## 2024-07-22 ENCOUNTER — Encounter (HOSPITAL_COMMUNITY): Payer: Self-pay

## 2024-07-22 NOTE — Therapy (Signed)
 [facility] [address] [place], [place], [address] Phone: [phone]   Fax:  [phone]  Occupational Therapy Treatment  Patient Details  Name: Lori Jensen MRN: [id number] Date of Birth: [birth year] No data recorded  Encounter Date: 07/10/2024   OT End of Session - 07/22/24 1232     Visit Number 6    Number of Visits 15    Date for Recertification  07/20/24    OT Start Time 1230    OT Stop Time 1330    OT Time Calculation (min) 60 min    Activity Tolerance Patient tolerated treatment well          Past Medical History:  Diagnosis Date   Anxiety    Bipolar disorder (HCC)    Depression    Dysmenorrhea    Fibromyalgia 11/2017   Heart murmur    Hypertension    PONV (postoperative nausea and vomiting)    PTSD (post-traumatic stress disorder)    Uterine fibroid     Past Surgical History:  Procedure Laterality Date   DILATION AND CURETTAGE OF UTERUS     DILATION AND CURETTAGE OF UTERUS  01/16/2013   Procedure: DILATATION AND CURETTAGE;  Surgeon: Peggye Gull, MD;  Location: [facility];  Service: Gynecology;;   ENDOMETRIAL ABLATION     uterine ablation  08/13/2011    There were no vitals filed for this visit.   Subjective Assessment - 07/22/24 1231     Currently in Pain? No/denies    Pain Score 0-No pain              Group Session:  S: Doing better today.  O: During today's OT group session, the patient participated in an educational segment about the importance of goal-setting and the application of the SMART framework to enhance daily life, particularly focusing on ADLs and iADLs. The session began with five open-ended pre-session questions that facilitated group discussion and introspection about their current relationship with goals. Following the introduction and educational segment, participants engaged in brainstorming and group discussions to devise hypothetical SMART goals. The session concluded with five  post-session questions to reinforce understanding and facilitate reflection. Throughout the session, there was a range of engagement levels noted among the participants.   A:  Patient demonstrated a high level of engagement throughout the session. They actively participated in discussions, sharing personal experiences related to goal setting and challenges faced. Patient was able to clearly articulate an understanding of the SMART framework and proposed personal SMART goals related to their own ADLs with minimal assistance. They expressed enthusiasm about applying what they learned to their daily routine and appeared motivated to make changes.   P: Continue to attend PHP OT group sessions 5x week for 4 weeks to promote daily structure, social engagement, and opportunities to develop and utilize adaptive strategies to maximize functional performance in preparation for safe transition and integration back into school, work, and the community. Plan to address topic of tbd in next OT group session.                  OT Education - 07/22/24 1231     Education Details SMART Goals 1    Person(s) Educated Patient           OT Short Term Goals - 07/08/24 2058       OT SHORT TERM GOAL #1   Title Patient will be educated on strategies to improve psychosocial skills needed to participate fully in  all daily, work, and leisure activities.    Time 3    Period Weeks    Status On-going    Target Date 07/20/24      OT SHORT TERM GOAL #2   Title Patient will be educated on a HEP and independent with implementation of HEP.    Status On-going      OT SHORT TERM GOAL #3   Title Patient will independently apply psychosocial skills and coping mechanisms to her daily activities in order to function independently.    Status On-going                   Plan - 07/22/24 1232     Psychosocial Skills Coping Strategies;Habits;Environmental  Adaptations;Interpersonal  Interaction;Routines and Behaviors          Patient will benefit from skilled therapeutic intervention in order to improve the following deficits and impairments:       Psychosocial Skills: Coping Strategies, Habits, Environmental  Adaptations, Interpersonal Interaction, Routines and Behaviors   Visit Diagnosis: Difficulty coping    Problem List Patient Active Problem List   Diagnosis Date Noted   Bipolar 2 disorder (HCC) 04/07/2022   PTSD (post-traumatic stress disorder) 04/07/2022   Vulval cellulitis 03/25/2018   Vaginal discharge 03/25/2018   Candidiasis of mouth 03/25/2018   Abscess of vulva 03/25/2018   Migraine 12/03/2017   Neck fullness 09/21/2017   Irregular menstrual bleeding 11/10/2016   Moderate episode of recurrent major depressive disorder (HCC) 11/06/2015   Obesity (BMI 30-39.9) 11/03/2015   Myofascial pain 11/03/2015   Essential hypertension 11/03/2015   SMOKER 07/23/2009   PALPITATIONS 06/12/2009   PANIC ATTACKS 11/24/2006   DEPRESSIVE DISORDER, NOS 11/24/2006   MITRAL VALVE PROLAPSE 11/24/2006   CHEST PAIN 11/24/2006    [place] KANDICE Purpura, OT 07/22/2024, 12:33 PM  [place] Purpura, OT   [facility] [address] [place], [place], [address] Phone: [phone]   Fax:  [phone]  Name: Lori Jensen MRN: [id number] Date of Birth: 1971/08/18

## 2024-07-23 ENCOUNTER — Ambulatory Visit (HOSPITAL_COMMUNITY)

## 2024-07-24 ENCOUNTER — Telehealth (HOSPITAL_COMMUNITY): Payer: Self-pay | Admitting: *Deleted

## 2024-07-24 ENCOUNTER — Ambulatory Visit (HOSPITAL_COMMUNITY)

## 2024-07-24 NOTE — Telephone Encounter (Signed)
 She is doing group therapy.  Should see Tanika for medication management.  I am not sure if taking Klonopin  1 mg twice a day because last note mention 1 mg at bedtime and as she increased the medication on her own.  Please clarify with Tanika.

## 2024-07-24 NOTE — Telephone Encounter (Signed)
 I just LVM for pt. There was some confusion at the pharmacy but there is now a script ready to be picked up.

## 2024-07-24 NOTE — Telephone Encounter (Signed)
 Okay, let me know if I need to do anything.  Please inform the patient about update.

## 2024-07-24 NOTE — Telephone Encounter (Signed)
 Pt called requesting refill of the Klonopin  1 mg 1 BID PRN. Last prescription was for #30 with 2 fills but it has been filled after 15 days, so she is out. Pt has next appointment with you on 08/20/24 after completing PHP. Please advise.

## 2024-07-25 ENCOUNTER — Ambulatory Visit (HOSPITAL_COMMUNITY): Admitting: Licensed Clinical Social Worker

## 2024-07-25 DIAGNOSIS — F3181 Bipolar II disorder: Secondary | ICD-10-CM | POA: Diagnosis not present

## 2024-07-25 DIAGNOSIS — F4310 Post-traumatic stress disorder, unspecified: Secondary | ICD-10-CM

## 2024-07-25 MED ORDER — TOPIRAMATE 25 MG PO TABS
25.0000 mg | ORAL_TABLET | Freq: Every day | ORAL | 0 refills | Status: DC
Start: 2024-07-25 — End: 2024-08-15

## 2024-07-25 NOTE — Progress Notes (Signed)
  [facility] Partial Outpatient Program Discharge Summary  Lori Jensen [id number]  Admission date: 07/02/2024 Discharge date: 07/27/2024  Reason for admission: Per admission assessment note:  Lori Jensen is a 53 year old female who presents due to worsening depression and anxiety symptoms.  Patient carries a diagnosis related to depression, anxiety and bipolar disorder.  She is currently prescribed Wellbutrin , Klonopin  and amitriptyline.  She reports she is taking and tolerating medications well reports experiencing hopelessness, increased depression and suicidal ideations with a plan to overdose.  Reports struggling with self-esteem issues and reports she is codependent.  She reports she and her son who is 24 has a slightly strained relationship.  States she has a daughter who is 44 who resides in [place]  does not understand mental illness which can be frustrating at times when she attempts to have conversations with therapist.   Progress in Program Toward Treatment Goals: Progressing, [name] attended and participated with daily group session with active and engaged participation.  Recently,  had a few missed days due reports of stomach virus.  She reports she has been taking and tolerating medications well.  States she has been needing to utilize Klonopin  more frequently due to increased anxiety and sleep disturbance.  While attending partial hospitalization programming patient was reinitiated on Topamax for mood stabilization.  She declined follow-up with intensive outpatient programming.  States she wants to focus on trauma-based therapy and individual therapy after discharge.  No concerns related to suicidal or homicidal ideations.  Denies auditory visual hallucinations.  Currently followed by psychiatrist Arfeen keep all outpatient follow-up appointments.  Progress (rationale): Seeking additional outpatient resources for trauma-based therapy.  Declined stepping down to  intensive outpatient programming (IOP)at this time.  Collaboration of Care: Medication Management AEB Topamax 25 mg, Wellbutrin  300 mg, Gabapentin  600 mg and Klonopin  1 mg po PRN   Patient/Guardian was advised Release of Information must be obtained prior to any record release in order to collaborate their care with an outside provider. Patient/Guardian was advised if they have not already done so to contact the registration department to sign all necessary forms in order for us  to release information regarding their care.   Consent: Patient/Guardian gives verbal consent for treatment and assignment of benefits for services provided during this visit. Patient/Guardian expressed understanding and agreed to proceed.   Staci Kerns NP 07/25/2024

## 2024-07-26 ENCOUNTER — Ambulatory Visit (HOSPITAL_COMMUNITY)

## 2024-07-26 DIAGNOSIS — F4310 Post-traumatic stress disorder, unspecified: Secondary | ICD-10-CM

## 2024-07-26 DIAGNOSIS — F3181 Bipolar II disorder: Secondary | ICD-10-CM | POA: Diagnosis not present

## 2024-07-27 ENCOUNTER — Ambulatory Visit (HOSPITAL_COMMUNITY)

## 2024-07-27 ENCOUNTER — Ambulatory Visit (HOSPITAL_COMMUNITY): Admitting: Licensed Clinical Social Worker

## 2024-07-27 DIAGNOSIS — R4589 Other symptoms and signs involving emotional state: Secondary | ICD-10-CM

## 2024-07-27 DIAGNOSIS — F3181 Bipolar II disorder: Secondary | ICD-10-CM | POA: Diagnosis not present

## 2024-07-27 DIAGNOSIS — F4310 Post-traumatic stress disorder, unspecified: Secondary | ICD-10-CM

## 2024-07-27 NOTE — Psych (Signed)
 [facility] BH PHP THERAPIST PROGRESS NOTE  Lori Jensen [id number]  Session Time: 9:00-10:00  Participation Level: Active  Behavioral Response: CasualAlertAnxious and Depressed  Type of Therapy: Group Therapy  Treatment Goals addressed: Coping  Progress Towards Goals: Initial  Interventions: CBT, DBT, Solution Focused, Strength-based, Supportive, and Reframing  Summary: Lori Jensen is a 52 y.o. female who presents with depression and anxiety symptoms, including pSI. Clinician led check-in regarding current stressors and situation, and review of patient completed daily inventory. Clinician utilized active listening and empathetic response and validated patient emotions. Clinician facilitated processing group on pertinent issues.   Therapist Response: Patient arrived within time allowed. Patient rates her depression at a 7 and her anxiety at an 8 on a scale of 1-10 with 10 being high. Pt states she slept 5 hours and ate 3x yesterday. Pt states she continues to feel stuck in a loop of thinking about the issue with her son's girlfriend. Pt is able to identify anxiety, fear, and catastrophizing as playing a role. Pt is struggling to pull back from the situation. Pt identifies feelings of hopelessness. Pt reports experiencing worthlessness, but denies SI. Patient able to process. Patient engaged in discussion.          Session Time: 10:00 am - 11:00 am  Participation Level: Active  Behavioral Response: CasualAlertAnxious  Type of Therapy: Group Therapy  Treatment Goals addressed: Coping  Progress Towards Goals: Progressing  Interventions: CBT, DBT, Solution Focused, Strength-based, Supportive, and Reframing  Therapist Response: Cln led discussion on time as a piece in recovery. Cln held space for group members' frustrations that they are not better yet, that they are putting in the work but results are not quick, that they feel they will never get better. Cln provided  encouragment to group that they are not doing anything wrong or un-helpable, but that it takes time for treatment to work, for habits to build, and for medicine to reach efficacy. Cln utilized air cabin crew of a broken bone and that even following doctor's orders, time has to pass for the bone to heal.   Summary: Pt engaged in discussion and reports understanding.      Session Time: 11:00 am - 12:00 pm  Participation Level: Active  Behavioral Response: CasualAlertAnxious  Type of Therapy: Group Therapy  Treatment Goals addressed: Coping  Progress Towards Goals: Progressing  Interventions: CBT, DBT, Solution Focused, Strength-based, Supportive, and Reframing  Therapist Response: Cln led discussion on why and what now in terms of how we focus on our problems. Group members shared how focus on why has impacted them and barriers to focusing on what now. Group able to process. Cln encouraged pt's to consider what why accomplishes.   Summary:  Pt engaged in discussion and reports they struggle with focusing on why.       Session Time: 12:00 pm - 12:30 pm  Participation Level: Active  Behavioral Response: CasualAlertAnxious  Type of Therapy: Group Therapy  Treatment Goals addressed: Coping  Progress Towards Goals: Progressing  Interventions: Psychologist, Occupational, Supportive  Therapist Response: Reflection Group: Patients encouraged to practice skills and interpersonal techniques or work on mindfulness and relaxation techniques. The importance of self-care and making skills part of a routine to increase usage were stressed.  Summary: Patient engaged and participated appropriately.      Session Time: 12:30 pm - 1:30 pm  Participation Level: Active  Behavioral Response: CasualAlertAnxious  Type of Therapy: Group Therapy  Treatment Goals addressed: Coping  Progress Towards Goals: Progressing  Interventions: OT group  Therapist Response: Group was led  by occupational therapist, Edward Hollan.   Summary: Pt engaged and participated in discussion.      Session Time: 1:30 pm - 2:00 pm  Participation Level: Active  Behavioral Response: CasualAlertAnxious  Type of Therapy: Group Therapy  Treatment Goals addressed: Coping  Progress Towards Goals: Progressing  Interventions: CBT, DBT, Solution Focused, Strength-based, Supportive, and Reframing  Therapist Response: 1:30 pm - 1:50 pm: Cln introduced the Sensations distraction skills from DBT distress tolerance skill ACCEPTS. Cln discussed how this set of distraction skills can be helpful in situations where you feel overwhelmed and need to stabilize quickly.  1:50 - 2:00 pm: Clinician led check-out. Clinician assessed for immediate needs, medication compliance and efficacy, and safety concerns?  Summary: 1:30 pm - 1:50 pm:  Pt engaged in discussion and identifies how they can practice this skill.   1:50 pm - 2:00 pm: At check-out, patient reports no immediate concerns. Patient demonstrates progress as evidenced by engagement and responsiveness to treatment. Patient denies SI/HI/self-harm thoughts at the end of group.    Suicidal/Homicidal: Nowithout intent/plan  Plan: Pt will continue in PHP while working to decrease depression and anxiety symptoms, decrease pSI, increase daily functioning, and increase ability to manage symptoms in a healthy manner.   Collaboration of Care: Medication Management AEB Tanika Lewis,NP  Patient/Guardian was advised Release of Information must be obtained prior to any record release in order to collaborate their care with an outside provider. Patient/Guardian was advised if they have not already done so to contact the registration department to sign all necessary forms in order for us  to release information regarding their care.   Consent: Patient/Guardian gives verbal consent for treatment and assignment of benefits for services provided during this  visit. Patient/Guardian expressed understanding and agreed to proceed.   Diagnosis: Bipolar 2 disorder (HCC) [F31.81]    1. Bipolar 2 disorder (HCC)   2. Difficulty coping       Randall Bastos, LCSW

## 2024-07-27 NOTE — Psych (Signed)
 [facility] BH PHP THERAPIST PROGRESS NOTE  KENNAH HEHR [id number]  Session Time: 9:00-10:00  Participation Level: Active  Behavioral Response: CasualAlertAnxious and Depressed  Type of Therapy: Group Therapy  Treatment Goals addressed: Coping  Progress Towards Goals: Progressing  Interventions: CBT, DBT, Solution Focused, Strength-based, Supportive, and Reframing  Summary: Lori Jensen is a 53 y.o. female who presents with depression and anxiety symptoms, including pSI. Clinician led check-in regarding current stressors and situation, and review of patient completed daily inventory. Clinician utilized active listening and empathetic response and validated patient emotions. Clinician facilitated processing group on pertinent issues.   Therapist Response: Patient arrived within time allowed. Patient rates her depression at a 7 and her anxiety at an 8 on a scale of 1-10 with 10 being high. Pt states she slept 6 hours and ate 3x yesterday. Pt reports is continuing to put her skills to practice and is seeing improvement when using distractions. Pt reports continued struggle with flares of anxiety in re: to her children however has not texted more than once a day this week.  Patient able to process. Patient engaged in discussion.          Session Time: 10:00 am - 11:00 am  Participation Level: Active  Behavioral Response: CasualAlertAnxious  Type of Therapy: Group Therapy  Treatment Goals addressed: Coping  Progress Towards Goals: Progressing  Interventions: CBT, DBT, Solution Focused, Strength-based, Supportive, and Reframing  Therapist Response: Cln led discussion on feelings of worthlessness. Cln created space for group members to share the way worthless feelings have affected them and play a role in their symptomology. Cln informed discussion with CBT cognitive distortion emotional reasoning, CBT thought challenging, and DBT wise mind.  Summary: Pt engaged in discussion and  is able to process.       Session Time: 11:00 am - 12:00 pm  Participation Level: Active  Behavioral Response: CasualAlertAnxious  Type of Therapy: Group Therapy  Treatment Goals addressed: Coping  Progress Towards Goals: Progressing  Interventions: CBT, DBT, Solution Focused, Strength-based, Supportive, and Reframing  Therapist Response: Cln introduced topic of CBT cognitive distortions. Cln discussed unhealthy thought patterns and how our thoughts shape our reality and irrational thoughts can alter our perspective. Cln utilized handout cognitive distortions to discuss common examples of distorted thoughts and group members worked to identify examples in their own life.   Summary: Pt engaged in discussion and is able to determine examples of distorted thinking in their own life.      Session Time: 12:00 pm - 1:00 pm  Participation Level: Active  Behavioral Response: CasualAlertAnxious  Type of Therapy: Group Therapy  Treatment Goals addressed: Coping  Progress Towards Goals: Progressing  Interventions: OT group  Therapist Response: 12:00 - 12:50 Group was led by occupational therapist, Dallas Purpura.  12:50 - 1:00 pm: Clinician led check-out. Clinician assessed for immediate needs, medication compliance and efficacy, and safety concerns.  Summary: 12:00 - 12:50 Pt engaged and participated in discussion. 12:50 - 1:00 At check-out, patient contracts for safety.?Patient demonstrates progress as evidenced by her engagement and by being receptive to treatment. Patient denies SI/HI/self-harm thoughts at the end of group and agrees to seek help should those thoughts/feelings occur.?     Suicidal/Homicidal: Nowithout intent/plan  Plan: Pt will continue in PHP while working to decrease depression and anxiety symptoms, decrease pSI, increase daily functioning, and increase ability to manage symptoms in a healthy manner.   Collaboration of Care: Medication Management AEB  Tanika Lewis,NP  Patient/Guardian was advised  Release of Information must be obtained prior to any record release in order to collaborate their care with an outside provider. Patient/Guardian was advised if they have not already done so to contact the registration department to sign all necessary forms in order for us  to release information regarding their care.   Consent: Patient/Guardian gives verbal consent for treatment and assignment of benefits for services provided during this visit. Patient/Guardian expressed understanding and agreed to proceed.   Diagnosis: Bipolar 2 disorder (HCC) [F31.81]    1. Bipolar 2 disorder (HCC)   2. PTSD (post-traumatic stress disorder)       Randall Bastos, LCSW

## 2024-07-27 NOTE — Psych (Signed)
 [facility] BH PHP THERAPIST PROGRESS NOTE  TENZIN PAVON [id number]  Session Time: 9:00-10:00  Participation Level: Active  Behavioral Response: CasualAlertAnxious and Depressed  Type of Therapy: Group Therapy  Treatment Goals addressed: Coping  Progress Towards Goals: Initial  Interventions: CBT, DBT, Solution Focused, Strength-based, Supportive, and Reframing  Summary: Lori Jensen is a 53 y.o. female who presents with depression and anxiety symptoms, including pSI. Clinician led check-in regarding current stressors and situation, and review of patient completed daily inventory. Clinician utilized active listening and empathetic response and validated patient emotions. Clinician facilitated processing group on pertinent issues.   Therapist Response: Patient arrived within time allowed. Patient rates her depression at a 7 and her anxiety at an 9 on a scale of 1-10 with 10 being high. Pt states she slept 6.5 hours and ate 3x yesterday. Pt states yesterday was not good. Pt reports she had an appointment in the morning and was just not doing well emotionally. Pt shares she reached out to the pastor at the church across the street and was able to connect with his wife who sat with her for an hour and supported. Pt states she also talked to a friend who asked her to let them in so they can support her. Pt reports it was humbling to receive the support and she is grateful. Pt reports it is difficult for her to reach out and she will try to remember the positives of how it went next time. Pt states she was also having a high pain day and did all she could to manage it before bed and it did help her sleep.  Pt reports experiencing worthlessness, but denies SI. Patient able to process. Patient engaged in discussion.          Session Time: 10:00 am - 11:00 am  Participation Level: Active  Behavioral Response: Casual and GuardedAlertAnxious, Depressed, and flat  Type of Therapy: Group  Therapy  Treatment Goals addressed: Coping  Progress Towards Goals: Progressing  Interventions: CBT, DBT, Solution Focused, Strength-based, Supportive, and Reframing  Therapist Response: Cln led processing group for pt's current struggles. Group members shared stressors and provided support and feedback. Cln brought in topics of boundaries, healthy relationships, and unhealthy thought processes to inform discussion.   Summary: Pt able to process and provide support to group.       Session Time: 11:00 am - 12:00 pm  Participation Level: Active  Behavioral Response: CasualAlertAnxious  Type of Therapy: Group Therapy  Treatment Goals addressed: Coping  Progress Towards Goals: Progressing  Interventions: CBT, DBT, Solution Focused, Strength-based, Supportive, and Reframing  Therapist Response: Cln led discussion on the stigma of mental health and the way it has impacted pt's experience of their own mental health. Group members shared struggles such as lack of acceptance, increase in isolation, being scared of backlash when disclosing MH, and impact in support. Cln created space for pt's to process and share. Cln highlighted support groups as a safe space.   Summary: Pt engaged in discussion and shared ways stigma has impacted them.        Session Time: 12:00 pm - 12:30 pm  Participation Level: Active  Behavioral Response: CasualAlertAnxious  Type of Therapy: Group Therapy  Treatment Goals addressed: Coping  Progress Towards Goals: Progressing  Interventions: Psychologist, Occupational, Supportive  Therapist Response: Reflection Group: Patients encouraged to practice skills and interpersonal techniques or work on mindfulness and relaxation techniques. The importance of self-care and making skills part of a routine  to increase usage were stressed.  Summary: Patient engaged and participated appropriately.      Session Time: 12:30 pm - 1:30 pm  Participation Level:  Active  Behavioral Response: CasualAlertAnxious  Type of Therapy: Group Therapy  Treatment Goals addressed: Coping  Progress Towards Goals: Progressing  Interventions: CBT, DBT, Solution Focused, Strength-based, Supportive, and Reframing  Therapist Response: Cln led discussion on HALT (hungry, angry, lonely, tired) acronym, which encourages check-in with ourselves when feeling emotional. Cln encouraged pt's to use HALT as a first check-in step to address basic vulnerabilities before reacting. Group members discussed ways in which they react to being angry, lonely, and tired, and work to determine times in which utilizing HALT would have diverted a bigger issue.   Summary: Pt engaged in discussion and reports understanding of HALT and willingness to utilize it.      Session Time: 1:30 pm - 1:45 pm  Participation Level: Active  Behavioral Response: CasualAlertAnxious  Type of Therapy: Group Therapy  Treatment Goals addressed: Coping  Progress Towards Goals: Progressing  Interventions: CBT, DBT, Solution Focused, Strength-based, Supportive, and Reframing  Therapist Response: 1:30 pm - 1:45pm Clinician led check-out. Clinician assessed for immediate needs, medication compliance and efficacy, and safety concerns?  Summary: 1:30 pm - 1:45 pm: At check-out, patient contracts for safety.?Patient demonstrates progress as evidenced by her engagement and by being receptive to treatment. Patient denies SI/HI/self-harm thoughts at the end of group and agrees to seek help should those thoughts/feelings occur.?      Suicidal/Homicidal: Nowithout intent/plan  Plan: Pt will continue in PHP while working to decrease depression and anxiety symptoms, decrease pSI, increase daily functioning, and increase ability to manage symptoms in a healthy manner.   Collaboration of Care: Medication Management AEB Tanika Lewis,NP  Patient/Guardian was advised Release of Information must be obtained  prior to any record release in order to collaborate their care with an outside provider. Patient/Guardian was advised if they have not already done so to contact the registration department to sign all necessary forms in order for us  to release information regarding their care.   Consent: Patient/Guardian gives verbal consent for treatment and assignment of benefits for services provided during this visit. Patient/Guardian expressed understanding and agreed to proceed.   Diagnosis: Bipolar 2 disorder (HCC) [F31.81]    1. Bipolar 2 disorder (HCC)   2. PTSD (post-traumatic stress disorder)       Randall Bastos, LCSW

## 2024-07-27 NOTE — Psych (Signed)
 [facility] BH PHP THERAPIST PROGRESS NOTE  Lori Jensen [id number]  Session Time: 9:00-10:00  Participation Level: Active  Behavioral Response: CasualAlertAnxious and Depressed  Type of Therapy: Group Therapy  Treatment Goals addressed: Coping  Progress Towards Goals: Progressing  Interventions: CBT, DBT, Solution Focused, Strength-based, Supportive, and Reframing  Summary: Lori Jensen is a 53 y.o. female who presents with depression and anxiety symptoms, including pSI. Clinician led check-in regarding current stressors and situation, and review of patient completed daily inventory. Clinician utilized active listening and empathetic response and validated patient emotions. Clinician facilitated processing group on pertinent issues.   Therapist Response: Patient arrived within time allowed. Patient rates her depression at a 8 and her anxiety at an 10 on a scale of 1-10 with 10 being high. Pt states she slept 5 hours and ate 2x yesterday. Pt reports she has had a rough time since last Friday. Pt reports high anxiety and depression and feeling everything in a big way. Pt reports panic attacks multiple times daily in the past 4 days and also had a stomach bug which caused dizziness and disorientation. Pt reports struggling with pulling back from her son and feeling like she doesn't have anyone f she is not pushing the connection.  Patient able to process. Patient engaged in discussion.          Session Time: 10:00 am - 11:00 am  Participation Level: Active  Behavioral Response: CasualAlertAnxious  Type of Therapy: Group Therapy  Treatment Goals addressed: Coping  Progress Towards Goals: Progressing  Interventions: CBT, DBT, Solution Focused, Strength-based, Supportive, and Reframing  Therapist Response: Cln led discussion on DBT dialectics and balance. Cln encouraged pt's to utilize AND statements to cue their brain to hold two opposing ideas. Cln provided examples and group  members created their own AND statements.   Summary: Pt engaged in discussion and created AND statement around recognzing progress.            Session Time: 11:00 am - 12:00 pm   Participation Level: Active   Behavioral Response: CasualAlertAnxious   Type of Therapy: Group Therapy   Treatment Goals addressed: Coping   Progress Towards Goals: Progressing   Interventions: CBT, DBT, Solution Focused, Strength-based, Supportive, and Reframing   Therapist Response: Cln led discussion on negative self-talk and how it affects us . Cln utilized CBT to discuss how thoughts shape our feelings and actions. Group members shared how negative thinking affects them and worked to reframe their negative thinking.     Summary:  Pt engaged in discussion and reports understanding.              Session Time: 12:00 pm - 12:30 pm   Participation Level: Active   Behavioral Response: CasualAlertAnxious   Type of Therapy: Group Therapy   Treatment Goals addressed: Coping   Progress Towards Goals: Progressing   Interventions: Psychologist, Occupational, Supportive   Therapist Response: Reflection Group: Patients encouraged to practice skills and interpersonal techniques or work on mindfulness and relaxation techniques. The importance of self-care and making skills part of a routine to increase usage were stressed.   Summary: Patient engaged and participated appropriately.           Session Time: 12:30 pm - 1:30 pm   Participation Level: Active   Behavioral Response: CasualAlertAnxious   Type of Therapy: Group Therapy   Treatment Goals addressed: Coping   Progress Towards Goals: Progressing   Interventions: OT group   Therapist Response: Group was led by occupational  therapist, Edward Hollan.    Summary: Pt engaged and participated in discussion.           Session Time: 1:30 pm - 2:00 pm   Participation Level: Active   Behavioral Response: CasualAlertAnxious   Type of  Therapy: Group Therapy   Treatment Goals addressed: Coping   Progress Towards Goals: Progressing   Interventions: CBT, DBT, Solution Focused, Strength-based, Supportive, and Reframing   Therapist Response: 1:30 pm - 1:50 pm: Cln led group through guided streatching with deep breathing as a way to increase relaxation and decrease stress and tension being held in the body. Cln discussed the importance of harnessing the body-mind connection informed by DBT TIPP skills.  1:50 - 2:00 pm: Clinician led check-out. Clinician assessed for immediate needs, medication compliance and efficacy, and safety concerns?   Summary: 1:30 pm - 1:50 pm:  Pt engaged in discussion and practice.  1:50 pm - 2:00 pm: At check-out, patient reports no immediate concerns. Patient demonstrates progress as evidenced by engagement and responsiveness to treatment. Patient denies SI/HI/self-harm thoughts at the end of group.    Suicidal/Homicidal: Nowithout intent/plan  Plan: Pt will continue in PHP while working to decrease depression and anxiety symptoms, decrease pSI, increase daily functioning, and increase ability to manage symptoms in a healthy manner.   Collaboration of Care: Medication Management AEB Tanika Lewis,NP  Patient/Guardian was advised Release of Information must be obtained prior to any record release in order to collaborate their care with an outside provider. Patient/Guardian was advised if they have not already done so to contact the registration department to sign all necessary forms in order for us  to release information regarding their care.   Consent: Patient/Guardian gives verbal consent for treatment and assignment of benefits for services provided during this visit. Patient/Guardian expressed understanding and agreed to proceed.   Diagnosis: Bipolar 2 disorder (HCC) [F31.81]    1. Bipolar 2 disorder (HCC)   2. PTSD (post-traumatic stress disorder)       Randall Bastos, LCSW
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
## 2024-07-27 NOTE — Psych (Signed)
 [facility] BH PHP THERAPIST PROGRESS NOTE  Lori Jensen [id number]  Session Time: 9:00-10:00  Participation Level: Active  Behavioral Response: CasualAlertAnxious and Depressed  Type of Therapy: Group Therapy  Treatment Goals addressed: Coping  Progress Towards Goals: Initial  Interventions: CBT, DBT, Solution Focused, Strength-based, Supportive, and Reframing  Summary: Lori Jensen is a 53 y.o. female who presents with depression and anxiety symptoms, including pSI. Clinician led check-in regarding current stressors and situation, and review of patient completed daily inventory. Clinician utilized active listening and empathetic response and validated patient emotions. Clinician facilitated processing group on pertinent issues.   Therapist Response: Patient arrived within time allowed. Patient rates her depression at a 7 and her anxiety at an 10 on a scale of 1-10 with 10 being high. Pt states she slept 7 hours and ate 4x yesterday. Pt states spoke to the man she has been dating off/on and it put her in a negative head space she struggled to get out of yesterday. Pt reports their dynamic is inconsistent and his back and forth behaviors are hurtful. Pt identifies feeling worthless and that she is not enough unless she's good Nat.  Patient able to process. Patient engaged in discussion.          Session Time: 10:00 am - 11:00 am  Participation Level: Active  Behavioral Response: CasualAlertAnxious  Type of Therapy: Group Therapy  Treatment Goals addressed: Coping  Progress Towards Goals: Progressing  Interventions: CBT, DBT, Solution Focused, Strength-based, Supportive, and Reframing  Therapist Response: Cln led discussion on forgiveness. Group members shared ways in which they struggle with forgiveness and how it has hurt them. Cln provided space for group to process. Cln encouraged pt's to consider forgiveness as a journey to free themselves from something holding them  back.   Summary: Pt engaged in discussion and is able to process.       Session Time: 11:00 am - 12:00 pm  Participation Level: Active  Behavioral Response: CasualAlertAnxious  Type of Therapy: Group Therapy  Treatment Goals addressed: Coping  Progress Towards Goals: Progressing  Interventions: CBT, DBT, Solution Focused, Strength-based, Supportive, and Reframing  Therapist Response: Cln led discussion on how to fill unplanned time. Group members shared ways in which downtime negatively impacts mental health and often causes them to dwell on negative thinking. Group brainstormed ways to manage down time and problem solved how to handle barriers.   Summary: Pt engaged in discussion.     Session Time: 12:00 pm - 1:00 pm  Participation Level: Active  Behavioral Response: CasualAlertAnxious  Type of Therapy: Group Therapy  Treatment Goals addressed: Coping  Progress Towards Goals: Progressing  Interventions: CBT, DBT, Supportive, Reframing  Therapist Response: 12:00 - 12:50 Cln led coping skill toolbox activity in which group members compiled DBT coping skills and created tangible reminders of how and when to utilize the skills. Each group member personalized the skills to their preferences and made a kit they can go to that holds everything they need to cope when elevated.  12:50 - 1:00 pm: Clinician led check-out. Clinician assessed for immediate needs, medication compliance and efficacy, and safety concerns.  Summary: 12:00 - 12:50 Pt engaged and participated in activity.  12:50 - 1:00 At check-out, patient contracts for safety.?Patient demonstrates progress as evidenced by her engagement and by being receptive to treatment. Patient denies SI/HI/self-harm thoughts at the end of group and agrees to seek help should those thoughts/feelings occur.?     Suicidal/Homicidal: Nowithout intent/plan  Plan: Pt  will continue in PHP while working to decrease depression and  anxiety symptoms, decrease pSI, increase daily functioning, and increase ability to manage symptoms in a healthy manner.   Collaboration of Care: Medication Management AEB Tanika Lewis,NP  Patient/Guardian was advised Release of Information must be obtained prior to any record release in order to collaborate their care with an outside provider. Patient/Guardian was advised if they have not already done so to contact the registration department to sign all necessary forms in order for us  to release information regarding their care.   Consent: Patient/Guardian gives verbal consent for treatment and assignment of benefits for services provided during this visit. Patient/Guardian expressed understanding and agreed to proceed.   Diagnosis: Bipolar 2 disorder (HCC) [F31.81]    1. Bipolar 2 disorder (HCC)   2. PTSD (post-traumatic stress disorder)       Randall Bastos, LCSW

## 2024-07-27 NOTE — Psych (Signed)
 [facility] BH PHP THERAPIST PROGRESS NOTE  Lori Jensen [id number]  Session Time: 9:00-10:00  Participation Level: Active  Behavioral Response: CasualAlertAnxious and Depressed  Type of Therapy: Group Therapy  Treatment Goals addressed: Coping  Progress Towards Goals: Initial  Interventions: CBT, DBT, Solution Focused, Strength-based, Supportive, and Reframing  Summary: Lori Jensen is a 53 y.o. female who presents with depression and anxiety symptoms, including pSI. Clinician led check-in regarding current stressors and situation, and review of patient completed daily inventory. Clinician utilized active listening and empathetic response and validated patient emotions. Clinician facilitated processing group on pertinent issues.   Therapist Response: Patient arrived within time allowed. Patient rates her depression at a 6 and her anxiety at an 8 on a scale of 1-10 with 10 being high. Pt states she slept 6 hours and ate 4x yesterday. Pt states she is trying to back off some with her children after recognizing she is hovering. Pt states the process is spiking her anxiety, however thinks it is the right move. Pt reports trying to distract herself with moderate success. Patient able to process. Patient engaged in discussion.          Session Time: 10:00 am - 11:00 am  Participation Level: Active  Behavioral Response: CasualAlertAnxious  Type of Therapy: Group Therapy  Treatment Goals addressed: Coping  Progress Towards Goals: Progressing  Interventions: CBT, DBT, Solution Focused, Strength-based, Supportive, and Reframing  Therapist Response: Cln led discussion on increasing support. Group members shared what supports they have and where it is lacking. Cln encouraged ways to increase adult friendships including accepting invitations, joining clubs/activities, reaching out, and support groups.    Summary: Pt engaged in discussion and is able to determine ways to increase their  support.            Session Time: 11:00 am - 12:00 pm   Participation Level: Active   Behavioral Response: CasualAlertAnxious   Type of Therapy: Group Therapy   Treatment Goals addressed: Coping   Progress Towards Goals: Progressing   Interventions: CBT, DBT, Solution Focused, Strength-based, Supportive, and Reframing   Therapist Response: Cln led discussion on HALT (hungry, angry, lonely, tired) acronym, which encourages check-in with ourselves when feeling emotional. Cln encouraged pt's to use HALT as a first check-in step to address basic vulnerabilities before reacting. Group members discussed ways in which they react to being angry, lonely, and tired, and work to determine times in which utilizing HALT would have diverted a bigger issue.    Summary:   Pt engaged in discussion and reports understanding of HALT and willingness to utilize it.             Session Time: 12:00 pm - 12:30 pm   Participation Level: Active   Behavioral Response: CasualAlertAnxious   Type of Therapy: Group Therapy   Treatment Goals addressed: Coping   Progress Towards Goals: Progressing   Interventions: Psychologist, Occupational, Supportive   Therapist Response: Reflection Group: Patients encouraged to practice skills and interpersonal techniques or work on mindfulness and relaxation techniques. The importance of self-care and making skills part of a routine to increase usage were stressed.   Summary: Patient engaged and participated appropriately.           Session Time: 12:30 pm - 1:30 pm   Participation Level: Active   Behavioral Response: CasualAlertAnxious   Type of Therapy: Group Therapy   Treatment Goals addressed: Coping   Progress Towards Goals: Progressing   Interventions: OT group   Therapist  Response: Group was led by occupational therapist, Edward Hollan.    Summary: Pt engaged and participated in discussion.           Session Time: 1:30 pm - 2:00 pm    Participation Level: Active   Behavioral Response: CasualAlertAnxious   Type of Therapy: Group Therapy   Treatment Goals addressed: Coping   Progress Towards Goals: Progressing   Interventions: CBT, DBT, Solution Focused, Strength-based, Supportive, and Reframing   Therapist Response: 1:30 pm - 1:50 pm: Cln introduced DBT emotion regulation skill, PLEASE. Cln discussed the importance of taking care of our whole body as a way to aid in stabilizing emotions. Group discussed ways they struggle to manage the elements of PLEASE and how to remove barriers.  1:50 - 2:00 pm: Clinician led check-out. Clinician assessed for immediate needs, medication compliance and efficacy, and safety concerns?   Summary: 1:30 pm - 1:50 pm:  Pt engaged in discussion and is able to identify ways to utilize PLEASE skill.  1:50 pm - 2:00 pm: At check-out, patient reports no immediate concerns. Patient demonstrates progress as evidenced by engagement and responsiveness to treatment. Patient denies SI/HI/self-harm thoughts at the end of group.    Suicidal/Homicidal: Nowithout intent/plan  Plan: Pt will continue in PHP while working to decrease depression and anxiety symptoms, decrease pSI, increase daily functioning, and increase ability to manage symptoms in a healthy manner.   Collaboration of Care: Medication Management AEB Tanika Lewis,NP  Patient/Guardian was advised Release of Information must be obtained prior to any record release in order to collaborate their care with an outside provider. Patient/Guardian was advised if they have not already done so to contact the registration department to sign all necessary forms in order for us  to release information regarding their care.   Consent: Patient/Guardian gives verbal consent for treatment and assignment of benefits for services provided during this visit. Patient/Guardian expressed understanding and agreed to proceed.   Diagnosis: Bipolar 2 disorder (HCC)  [F31.81]    1. Bipolar 2 disorder (HCC)   2. PTSD (post-traumatic stress disorder)       Randall Bastos, LCSW

## 2024-07-27 NOTE — Psych (Signed)
 [facility] BH PHP THERAPIST PROGRESS NOTE  Lori Jensen [id number]  Session Time: 9:00-10:00  Participation Level: Did Not Attend  Behavioral Response: CasualAlertAnxious and Depressed  Type of Therapy: Group Therapy  Treatment Goals addressed: Coping  Progress Towards Goals: Initial  Interventions: CBT, DBT, Solution Focused, Strength-based, Supportive, and Reframing  Summary: Lori Jensen is a 53 y.o. female who presents with depression and anxiety symptoms, including pSI. Clinician led check-in regarding current stressors and situation, and review of patient completed daily inventory. Clinician utilized active listening and empathetic response and validated patient emotions. Clinician facilitated processing group on pertinent issues.   Therapist Response: Patient did not attend. Pt arrived late after speaking to cln W. Cobia on the phone (see note).          Session Time: 10:00 am - 11:00 am  Participation Level: Did Not Attend  Behavioral Response: CasualAlertAnxious, Depressed  Type of Therapy: Group Therapy  Treatment Goals addressed: Coping  Progress Towards Goals: Progressing  Interventions: CBT, DBT, Solution Focused, Strength-based, Supportive, and Reframing  Therapist Response: Cln led discussion on safety and the ways in which we can seek and own it in ourselves. Group members shared how they view safety and situations which hinder safety. Cln encouraged pt's to think about emotional safety and ways to foster it.   Summary: Pt did not attend - arrived to group late      Session Time: 11:00 am - 12:00 pm  Participation Level: Active  Behavioral Response: CasualAlertAnxious  Type of Therapy: Group Therapy  Treatment Goals addressed: Coping  Progress Towards Goals: Progressing  Interventions: CBT, DBT, Solution Focused, Strength-based, Supportive, and Reframing  Therapist Response: Cln continued discussion on topic of boundaries. Group reviewed  previous aspects of boundaries discussed. Cln utilized handout Tips for [employer] and group members discussed how to apply the tips. Cln shaped conversation and cued for healthy boundary characteristics.   Summary:  Pt engaged in discussion and is able to process ways to increase their healthy boundaries. Pt is very tearful and scattered.        Session Time: 12:00 pm - 12:30 pm  Participation Level: Active  Behavioral Response: CasualAlertAnxious  Type of Therapy: Group Therapy  Treatment Goals addressed: Coping  Progress Towards Goals: Progressing  Interventions: Psychologist, Occupational, Supportive  Therapist Response: Reflection Group: Patients encouraged to practice skills and interpersonal techniques or work on mindfulness and relaxation techniques. The importance of self-care and making skills part of a routine to increase usage were stressed.  Summary: Patient engaged and participated appropriately.      Session Time: 12:30 pm - 1:30 pm  Participation Level: Active  Behavioral Response: CasualAlertAnxious  Type of Therapy: Group Therapy  Treatment Goals addressed: Coping  Progress Towards Goals: Progressing  Interventions: OT group  Therapist Response: Group was led by occupational therapist, Edward Hollan.   Summary: Pt engaged and participated in discussion.      Session Time: 1:30 pm - 2:00 pm  Participation Level: Active  Behavioral Response: CasualAlertAnxious  Type of Therapy: Group Therapy  Treatment Goals addressed: Coping  Progress Towards Goals: Progressing  Interventions: CBT, DBT, Solution Focused, Strength-based, Supportive, and Reframing  Therapist Response: 1:30 pm - 1:50 pm: Cln led group through DBT skill, [id number], as a grounding and emotion regulation strategy. Cln encouraged pt's to utilize mindfulness mind set during the practice.  1:50 - 2:00 pm: Clinician led check-out. Clinician assessed for immediate needs,  medication compliance and efficacy, and safety concerns?  Summary: 1:30 pm - 1:50 pm: Pt participated and engaged in activity.  1:50 pm - 2:00 pm: At check-out, patient reports no immediate concerns. Patient demonstrates progress as evidenced by engagement and responsiveness to treatment. Patient denies SI/HI/self-harm thoughts at the end of group.   Suicidal/Homicidal: Nowithout intent/plan  Plan: Pt will continue in PHP while working to decrease depression and anxiety symptoms, decrease pSI, increase daily functioning, and increase ability to manage symptoms in a healthy manner.   Collaboration of Care: Medication Management AEB Tanika Lewis,NP  Patient/Guardian was advised Release of Information must be obtained prior to any record release in order to collaborate their care with an outside provider. Patient/Guardian was advised if they have not already done so to contact the registration department to sign all necessary forms in order for us  to release information regarding their care.   Consent: Patient/Guardian gives verbal consent for treatment and assignment of benefits for services provided during this visit. Patient/Guardian expressed understanding and agreed to proceed.   Diagnosis: Bipolar 2 disorder (HCC) [F31.81]    1. Bipolar 2 disorder (HCC)   2. Chronic post-traumatic stress disorder (PTSD)       Randall Bastos, LCSW

## 2024-07-27 NOTE — Psych (Signed)
 [facility] BH PHP THERAPIST PROGRESS NOTE  Lori Jensen [id number]  Session Time: 9:00-10:00  Participation Level: Active  Behavioral Response: CasualAlertAnxious and Depressed  Type of Therapy: Group Therapy  Treatment Goals addressed: Coping  Progress Towards Goals: Progressing  Interventions: CBT, DBT, Solution Focused, Strength-based, Supportive, and Reframing  Summary: Lori Jensen is a 53 y.o. female who presents with depression and anxiety symptoms, including pSI. Clinician led check-in regarding current stressors and situation, and review of patient completed daily inventory. Clinician utilized active listening and empathetic response and validated patient emotions. Clinician facilitated processing group on pertinent issues.   Therapist Response: Patient arrived within time allowed. Patient rates her depression at a 7 and her anxiety at an 8 on a scale of 1-10 with 10 being high. Pt states she slept 7 hours and ate 5x yesterday. Pt reports she had a revelation during the drive to group due to a song. Pt states it showed her things that are disgusting about myself. Pt reports feeling convicted but is glad to have increased insight. Pt reports she has been putting more effort into appearance the past few days to help her mood. Patient able to process. Patient engaged in discussion.          Session Time: 10:00 am - 11:00 am  Participation Level: Active  Behavioral Response: CasualAlertAnxious  Type of Therapy: Group Therapy  Treatment Goals addressed: Coping  Progress Towards Goals: Progressing  Interventions: CBT, DBT, Solution Focused, Strength-based, Supportive, and Reframing  Therapist Response: Cln led discussion on healthy aggression substitutes. Cln discussed the benefits to discharging energy and adrenaline when feeling revved up in emotion and the importance of balancing that discharge with safety and lack of negative consequences. Group brainstormed  different ways to channel aggression in a healthy way and shared ways that have worked for them in the past.   Summary: Pt engaged in discussion and identifies 3 options to try.          Session Time: 11:00 am - 12:00 pm   Participation Level: Active   Behavioral Response: CasualAlertAnxious   Type of Therapy: Group Therapy   Treatment Goals addressed: Coping   Progress Towards Goals: Progressing   Interventions: CBT, DBT, Solution Focused, Strength-based, Supportive, and Reframing   Therapist Response: Cln introduced CBT and the way in which it can provide context for addressing stumbling blocks. Group discussed the problem is not the problem, the problem is how we're thinking about the problem and tried to change perspective on current struggles.    Summary:  Pt engaged in discussion and is able to attempt reframing using CBT.              Session Time: 12:00 pm - 12:30 pm   Participation Level: Active   Behavioral Response: CasualAlertAnxious   Type of Therapy: Group Therapy   Treatment Goals addressed: Coping   Progress Towards Goals: Progressing   Interventions: Psychologist, Occupational, Supportive   Therapist Response: Reflection Group: Patients encouraged to practice skills and interpersonal techniques or work on mindfulness and relaxation techniques. The importance of self-care and making skills part of a routine to increase usage were stressed.   Summary: Patient engaged and participated appropriately.           Session Time: 12:30 pm - 1:30 pm   Participation Level: Active   Behavioral Response: CasualAlertAnxious   Type of Therapy: Group Therapy   Treatment Goals addressed: Coping   Progress Towards Goals: Progressing   Interventions:  OT group   Therapist Response: Group was led by occupational therapist, Edward Hollan.    Summary: Pt engaged and participated in discussion.           Session Time: 1:30 pm - 1:45 pm   Participation  Level: Active   Behavioral Response: CasualAlertAnxious   Type of Therapy: Group Therapy   Treatment Goals addressed: Coping   Progress Towards Goals: Progressing   Interventions: CBT, DBT, Solution Focused, Strength-based, Supportive, and Reframing   Therapist Response: 1:30 pm - 1:45 pm: Clinician led check-out. Clinician assessed for immediate needs, medication compliance and efficacy, and safety concerns?   Summary: 1:30 pm - 1:45 pm: At check-out, patient reports no immediate concerns. Patient demonstrates progress as evidenced by engagement and responsiveness to treatment. Patient denies SI/HI/self-harm thoughts at the end of group.    Suicidal/Homicidal: Nowithout intent/plan  Plan: Pt will continue in PHP while working to decrease depression and anxiety symptoms, decrease pSI, increase daily functioning, and increase ability to manage symptoms in a healthy manner.   Collaboration of Care: Medication Management AEB Tanika Lewis,NP  Patient/Guardian was advised Release of Information must be obtained prior to any record release in order to collaborate their care with an outside provider. Patient/Guardian was advised if they have not already done so to contact the registration department to sign all necessary forms in order for us  to release information regarding their care.   Consent: Patient/Guardian gives verbal consent for treatment and assignment of benefits for services provided during this visit. Patient/Guardian expressed understanding and agreed to proceed.   Diagnosis: Bipolar 2 disorder (HCC) [F31.81]    1. Bipolar 2 disorder (HCC)   2. PTSD (post-traumatic stress disorder)       Randall Bastos, LCSW

## 2024-07-30 ENCOUNTER — Ambulatory Visit (HOSPITAL_COMMUNITY)

## 2024-07-31 ENCOUNTER — Ambulatory Visit (HOSPITAL_COMMUNITY)

## 2024-07-31 ENCOUNTER — Telehealth (HOSPITAL_COMMUNITY): Payer: Self-pay | Admitting: Licensed Clinical Social Worker

## 2024-07-31 ENCOUNTER — Ambulatory Visit (INDEPENDENT_AMBULATORY_CARE_PROVIDER_SITE_OTHER): Admitting: Professional

## 2024-07-31 DIAGNOSIS — R4589 Other symptoms and signs involving emotional state: Secondary | ICD-10-CM

## 2024-07-31 DIAGNOSIS — F3181 Bipolar II disorder: Secondary | ICD-10-CM | POA: Diagnosis not present

## 2024-07-31 NOTE — Progress Notes (Signed)
 Spoke with patient in person for PHP. States that today is her last day. She is tearful and crying when speaking of her son. Feels lonely here in [place] since he won't talk to her now and is in a bad situation. She is thinking of moving to [place]  to be with her daughter and grandchild there. Doesn't want to make a hasty decision and will take time to see what happens with her son. On scale 1-10 as 10 being worst she rates depression at 8 and anxiety at 9. Denies SI/HI/AVH. Does not feel ready to leave group but hoping she enjoys IOP. She just prefers to have in person meetings over virtual. No side effects from medication. No issue or complaint.

## 2024-07-31 NOTE — Telephone Encounter (Signed)
 See call log

## 2024-07-31 NOTE — Progress Notes (Signed)
  [facility] Partial Outpatient Program Discharge Summary  Lori Jensen [id number]  Admission date: 07/02/2024 Discharge date: 11/04//2025  Reason for admission: Per admission assessment note:  Lori Jensen is a 53 year old female who presents due to worsening depression and anxiety symptoms.  Patient carries a diagnosis related to depression, anxiety and bipolar disorder.  She is currently prescribed Wellbutrin , Klonopin  and amitriptyline.  She reports she is taking and tolerating medications well reports experiencing hopelessness, increased depression and suicidal ideations with a plan to overdose.  Reports struggling with self-esteem issues and reports she is codependent.  She reports she and her son who is 24 has a slightly strained relationship.  States she has a daughter who is 82 who resides in [place]  does not understand mental illness which can be frustrating at times when she attempts to have conversations with therapist.   Progress in Program Toward Treatment Goals: Progressing, Lori Jensen attended and participated with daily group session with active and engaged participation.  Recently,  had a few missed days due reports of stomach virus.  She reports she has been taking and tolerating medications well.  States she has been needing to utilize Klonopin  more frequently due to increased anxiety and sleep disturbance.  While attending partial hospitalization programming patient was reinitiated on Topamax for mood stabilization.  She declined follow-up with intensive outpatient programming.  States she wants to focus on trauma-based therapy and individual therapy after discharge.  No concerns related to suicidal or homicidal ideations.  Denies auditory visual hallucinations.  Currently followed by psychiatrist Arfeen keep all outpatient follow-up appointments.  Progress (rationale): Seeking additional outpatient resources for trauma-based therapy.  Declined stepping down to  intensive outpatient programming (IOP)at this time. - Was reported that patient decided to make up missed days scheduled to discharge 07/31/2024   Collaboration of Care: Medication Management AEB Topamax 25 mg, Wellbutrin  300 mg, Gabapentin  600 mg and Klonopin  1 mg po PRN   Patient/Guardian was advised Release of Information must be obtained prior to any record release in order to collaborate their care with an outside provider. Patient/Guardian was advised if they have not already done so to contact the registration department to sign all necessary forms in order for us  to release information regarding their care.   Consent: Patient/Guardian gives verbal consent for treatment and assignment of benefits for services provided during this visit. Patient/Guardian expressed understanding and agreed to proceed.   Staci Kerns NP 07/31/2024

## 2024-07-31 NOTE — Psych (Addendum)
 [facility] BH PHP THERAPIST PROGRESS NOTE  Lori Jensen [id number]  Session Time: 9:00-10:00  Participation Level: Active  Behavioral Response: CasualAlertAnxious and Depressed  Type of Therapy: Group Therapy  Treatment Goals addressed: Coping  Progress Towards Goals: Progressing  Interventions: CBT, DBT, Solution Focused, Strength-based, Supportive, and Reframing  Summary: Lori Jensen is a 53 y.o. female who presents with depression and anxiety symptoms, including pSI. Clinician led check-in regarding current stressors and situation, and review of patient completed daily inventory. Clinician utilized active listening and empathetic response and validated patient emotions. Clinician facilitated processing group on pertinent issues.   Therapist Response: Patient arrived within time allowed. Patient rates her depression at a 9 and her anxiety at an 9 on a scale of 1-10 with 10 being high. Pt states she slept 5 hours and ate 5x yesterday. Pt denies having SI/HI since last session, endorses feelings of hopelessness/worthlessness due to continued lack of communication from son. She reports she does not feel ready to discharge from [facility] but will follow-up with IOP. She reports she is considering moving to [place]  to be closer to her daughter and grandson. She is worried about leaving her adult son here. Patient able to process. Patient engaged in discussion.      Session Time: 10:00 am - 11:00 am   Participation Level: Active   Behavioral Response: CasualAlertAnxious and Depressed   Type of Therapy: Group Therapy   Treatment Goals addressed: Coping   Progress Towards Goals: Progressing   Interventions: CBT, DBT, Solution Focused, Strength-based, Supportive, and Reframing   Therapist Response: Clinician led processing group for pt's current struggles. Group members shared stressors and provided support and feedback. Clinician brought in topics of power of choice and making decisions  using Wise mind.   Summary: Pt able to process and provide support to group.         Session Time: 11:00 am - 12:00 pm   Participation Level: Active   Behavioral Response: Casual Alert and Anxious/Depressed   Type of Therapy: Group Therapy   Treatment Goals addressed: Coping   Progress Towards Goals: Progressing   Interventions: CBT, DBT, Solution Focused, Strength-based, Supportive, and Reframing   Therapist Response: Group was led by [facility] chaplain, Lori Jensen.   Summary: Pt engaged in discussion.       Session Time: 12:00 pm - 12:30 pm   Participation Level: Active   Behavioral Response: Casual Alert and Anxious/Depressed   Type of Therapy: Group Therapy   Treatment Goals addressed: Coping   Progress Towards Goals: Progressing   Interventions: Psychologist, Occupational, Supportive   Therapist Response: Reflection Group: Patients encouraged to practice skills and interpersonal techniques or work on mindfulness and relaxation techniques. The importance of self-care and making skills part of a routine to increase usage were stressed.   Summary: Pt engaged in discussion.        Session Time: 12:30 pm - 1:30 pm   Participation Level: Active   Behavioral Response: Casual Alert and Anxious/Depressed   Type of Therapy: Group Therapy   Treatment Goals addressed: Coping   Progress Towards Goals: Progressing   Interventions: OT group   Therapist Response: Group was led by occupational therapist, Lori Jensen.    Summary: Pt engaged in discussion.            Session Time: 1:30 pm - 1:45pm   Participation Level: Active   Behavioral Response: Casual Alert and Anxious/Depressed   Type of Therapy: Group Therapy   Treatment Goals  addressed: Coping   Progress Towards Goals: Progressing   Interventions: CBT, DBT, Solution Focused, Strength-based, Supportive, and Reframing   Therapist Response: 1:30 - 1:45 pm: Clinician led check-out. Clinician  assessed for immediate needs, medication compliance and efficacy, and safety concerns?   Summary: 1:30 pm - 1:45 pm: At check-out, patient reports no immediate concerns. Patient demonstrates progress as evidenced by engagement and responsiveness to treatment. Patient denies SI/HI/self-harm thoughts at the end of group.    Suicidal/Homicidal: Nowithout intent/plan  Plan: Patient will discharge from PHP due to meeting treatment goals of decreasing depression and anxiety symptoms, decreasing pSI, and increasing coping abilities. Progress was measured by observation, self-report, and scales. Provider has approved discharge and patient reports alignment with discharge plan. Patient will step down to IOP within this agency. Patient denies any SI/HI at time of discharge.   Collaboration of Care: Medication Management AEB Staci Kerns, NP and Other Lori Macadam, RN  Patient/Guardian was advised Release of Information must be obtained prior to any record release in order to collaborate their care with an outside provider. Patient/Guardian was advised if they have not already done so to contact the registration department to sign all necessary forms in order for us  to release information regarding their care.   Consent: Patient/Guardian gives verbal consent for treatment and assignment of benefits for services provided during this visit. Patient/Guardian expressed understanding and agreed to proceed.   Diagnosis: Bipolar 2 disorder (HCC) [F31.81]    1. Bipolar 2 disorder (HCC)       Lori Jensen, [facility]

## 2024-08-01 ENCOUNTER — Telehealth (HOSPITAL_COMMUNITY): Payer: Self-pay | Admitting: Psychiatry

## 2024-08-01 NOTE — Telephone Encounter (Signed)
 D:  Placed call to re-orient pt.  Pt will be stepping down from PHP and starting virtual MH-IOP on 08-09-24 @ 9 a.m.SABRA  Pt chose this date d/t upcoming appts that are scheduled.  A:  Oriented pt.  Encouraged pt to verify her benefits.  R:  Pt receptive.

## 2024-08-07 ENCOUNTER — Encounter (HOSPITAL_COMMUNITY): Payer: Self-pay

## 2024-08-07 NOTE — Therapy (Signed)
 [facility] [address] [place], [place], [address] Phone: [phone]   Fax:  [phone]  Occupational Therapy Treatment  Patient Details  Name: Lori Jensen MRN: [id number] Date of Birth: 07/07/1971 No data recorded  Encounter Date: 07/27/2024   OT End of Session - 08/07/24 1855     Visit Number 7    Number of Visits 15    Date for Recertification  07/20/24    OT Start Time 1200    OT Stop Time 1250    OT Time Calculation (min) 50 min          Past Medical History:  Diagnosis Date   Anxiety    Bipolar disorder (HCC)    Depression    Dysmenorrhea    Fibromyalgia 11/2017   Heart murmur    Hypertension    PONV (postoperative nausea and vomiting)    PTSD (post-traumatic stress disorder)    Uterine fibroid     Past Surgical History:  Procedure Laterality Date   DILATION AND CURETTAGE OF UTERUS     DILATION AND CURETTAGE OF UTERUS  01/16/2013   Procedure: DILATATION AND CURETTAGE;  Surgeon: Peggye Gull, MD;  Location: [facility];  Service: Gynecology;;   ENDOMETRIAL ABLATION     uterine ablation  08/13/2011    There were no vitals filed for this visit.   Subjective Assessment - 08/07/24 1855     Currently in Pain? No/denies    Pain Score 0-No pain               Group Session:  S: Feeling better today  O: The primary objective of this topic is to explore and understand the concept of occupational balance in the context of daily living. The term occupational balance is defined broadly, encompassing all activities that occupy an individual's time and energy, including self-care, leisure, and work-related tasks. The goal is to guide participants towards achieving a harmonious blend of these activities, tailored to their personal values and life circumstances. This balance is aimed at enhancing overall well-being, not by equally distributing time across activities, but by ensuring that daily engagements are fulfilling  and not draining. The content delves into identifying various barriers that individuals face in achieving occupational balance, such as overcommitment, misaligned priorities, external pressures, and lack of effective time management. The impact of these barriers on occupational performance, roles, and lifestyles is examined, highlighting issues like reduced efficiency, strained relationships, and potential health problems. Strategies for cultivating occupational balance are a key focus. These strategies include practical methods like time blocking, prioritizing tasks, establishing self-care rituals, decluttering, connecting with nature, and engaging in reflective practices. These approaches are designed to be adaptable and applicable to a wide range of life scenarios, promoting a proactive and mindful approach to daily living. The overall aim is to equip participants with the knowledge and tools to create a balanced lifestyle that supports their mental, emotional, and physical health, thereby improving their functional performance in daily life.   A:  The patient demonstrated a high level of engagement and active participation throughout the session on occupational balance. The patient frequently contributed to discussions, offering insightful reflections on personal experiences related to the barriers and strategies for achieving occupational balance. There was a clear understanding of the concept and an ability to relate it to their own life. The patient showed enthusiasm in learning and applying the strategies discussed, such as time blocking and self-care rituals, indicating a strong motivation to improve their occupational  balance. The patient's proactive approach and responsiveness to the topic suggest a high potential for implementing these strategies effectively in their daily routine.   P: Continue to attend PHP OT group sessions 5x week for 3 weeks to promote daily structure, social engagement,  and opportunities to develop and utilize adaptive strategies to maximize functional performance in preparation for safe transition and integration back into school, work, and the community. Plan to address topic of pt 2 in next OT group session.                 OT Education - 08/07/24 1855     Education Details Occupational Balance           OT Short Term Goals - 07/08/24 2058       OT SHORT TERM GOAL #1   Title Patient will be educated on strategies to improve psychosocial skills needed to participate fully in all daily, work, and leisure activities.    Time 3    Period Weeks    Status On-going    Target Date 07/20/24      OT SHORT TERM GOAL #2   Title Patient will be educated on a HEP and independent with implementation of HEP.    Status On-going      OT SHORT TERM GOAL #3   Title Patient will independently apply psychosocial skills and coping mechanisms to her daily activities in order to function independently.    Status On-going                   Plan - 08/07/24 1856     Psychosocial Skills Coping Strategies;Habits;Environmental  Adaptations;Interpersonal Interaction;Routines and Behaviors          Patient will benefit from skilled therapeutic intervention in order to improve the following deficits and impairments:       Psychosocial Skills: Coping Strategies, Habits, Environmental  Adaptations, Interpersonal Interaction, Routines and Behaviors   Visit Diagnosis: Difficulty coping    Problem List Patient Active Problem List   Diagnosis Date Noted   Bipolar 2 disorder (HCC) 04/07/2022   PTSD (post-traumatic stress disorder) 04/07/2022   Vulval cellulitis 03/25/2018   Vaginal discharge 03/25/2018   Candidiasis of mouth 03/25/2018   Abscess of vulva 03/25/2018   Migraine 12/03/2017   Neck fullness 09/21/2017   Irregular menstrual bleeding 11/10/2016   Moderate episode of recurrent major depressive disorder (HCC) 11/06/2015    Obesity (BMI 30-39.9) 11/03/2015   Myofascial pain 11/03/2015   Essential hypertension 11/03/2015   SMOKER 07/23/2009   PALPITATIONS 06/12/2009   PANIC ATTACKS 11/24/2006   DEPRESSIVE DISORDER, NOS 11/24/2006   MITRAL VALVE PROLAPSE 11/24/2006   CHEST PAIN 11/24/2006    [place] KANDICE Purpura, OT 08/07/2024, 6:56 PM Dallas Purpura, OT  [facility] [address] [place], [place], [address] Phone: [phone]   Fax:  [phone]  Name: VERNER KOPISCHKE MRN: [id number] Date of Birth: [birth year]

## 2024-08-09 ENCOUNTER — Other Ambulatory Visit (HOSPITAL_COMMUNITY): Attending: Psychiatry | Admitting: Psychiatry

## 2024-08-09 ENCOUNTER — Encounter (HOSPITAL_COMMUNITY): Payer: Self-pay | Admitting: Psychiatry

## 2024-08-09 DIAGNOSIS — F3181 Bipolar II disorder: Secondary | ICD-10-CM | POA: Insufficient documentation

## 2024-08-09 NOTE — Progress Notes (Signed)
 Virtual Visit via Video Note   I connected with Lori Jensen on 08/09/24 at  9:00 AM EDT by a video enabled telemedicine application and verified that I am speaking with the correct person using two identifiers.   At orientation to the IOP program, Case Manager discussed the limitations of evaluation and management by telemedicine and the availability of in person appointments. The patient expressed understanding and agreed to proceed with virtual visits throughout the duration of the program.   Location:  Patient: Patient Home Provider: Home Office   History of Present Illness: Bipolar II DIsorder   Observations/Objective: Check In: Case Manager checked in with all participants to review discharge dates, insurance authorizations, work-related documents and needs from the treatment team regarding medications. Lori Jensen stated needs and engaged in discussion.    Initial Therapeutic Activity: Counselor facilitated a check-in with Lori Jensen to assess for safety, sobriety and medication compliance.  Counselor also inquired about Lori Jensen's current emotional ratings, as well as any significant changes in thoughts, feelings or behavior since previous check in.  Lori Jensen presented for session on time and was alert, oriented x5, with no evidence or self-report of active SI/HI or A/V H.  Lori Jensen reported compliance with medication and denied use of alcohol or illicit substances.  Lori Jensen reported scores of 7/10 for depression, 8/10 for anxiety, and 5/10 for anger.  Lori Jensen denied any recent outbursts or panic attacks.  Lori Jensen reported that a struggle has been dealing with depression, anxiety, and grief, which led her to seek group for support.  Lori Jensen reported that a success was completing PHP and deciding to continue with treatment by starting MHIOP today.  Lori Jensen reported that her goal today is to attend a physical therapy appointment.         Second Therapeutic Activity: Counselor discussed topic of sleep  hygiene today with group.  Counselor defined this as the habits, behaviors and environmental factors that can be adjusted to improve overall sleep quality.  Counselor also discussed how lack of consistent sleep can negatively affect mood and overall mental health.  Counselor provided members with a screening to complete assessing typical barriers one might face in achieving quality sleep at night (i.e. struggling to get up in the morning, waking up throughout the night, tossing/turning, etc), and inquired about members' perception of sleep hygiene at present.  Counselor offered techniques to members via a virtual handout which could be implemented in order to improve sleep hygiene, such as sticking to a normal morning/night routine, avoiding using of electronics too close to bedtime, avoiding heavy meals before bed, using a sleep journal to track changes and address anxious thoughts, as well as avoiding naps during the daytime, ensuring proper use of medications if prescribed any by provider(s), and more.  Counselor inquired about changes members intend to make to sleep hygiene based upon information covered today.  Interventions were effective, as evidenced by Lori Jensen actively participating in discussion on subject, reporting that she has had poor sleep for several years, and felt like this was related to her childhood trauma, staying up late worrying about parents fighting or someone getting hurt.  She reported that as an adult, she hates being in the home alone and worries about her son.  Lori Jensen also completed a sleep assessment, which revealed that she is sleep deprived at present.  Lori Jensen reported that she plans to improve sleep hygiene over the course of treatment by sticking to a normal sleep/wake cycle, cutting back on nicotine use at bedtime, transitioning to  sleeping in her bed instead of the couch, and pursuing trauma treatment after MHIOP.    Assessment and Plan: Counselor recommends that Lori Jensen remain  in IOP treatment to better manage mental health symptoms, ensure stability and pursue completion of treatment plan goals. Counselor recommends adherence to crisis/safety plan, taking medications as prescribed, and following up with medical professionals if any issues arise.    Follow Up Instructions: Counselor will send [employer] Teams link for session tomorrow.  Lori Jensen was advised to call back or seek an in-person evaluation if the symptoms worsen or if the condition fails to improve as anticipated.   Collaboration of Care:   Medication Management AEB Staci Kerns, NP                                           Case Manager AEB Ricka Gaskins, CNA    Patient/Guardian was advised Release of Information must be obtained prior to any record release in order to collaborate their care with an outside provider. Patient/Guardian was advised if they have not already done so to contact the registration department to sign all necessary forms in order for us  to release information regarding their care.    Consent: Patient/Guardian gives verbal consent for treatment and assignment of benefits for services provided during this visit. Patient/Guardian expressed understanding and agreed to proceed.   I provided 180 minutes of non-face-to-face time during this encounter.   Darleene Ricker, [place], LCAS 08/09/24

## 2024-08-09 NOTE — Progress Notes (Signed)
 Virtual Visit via Video Note  I connected with Lori Jensen on @TODAY @ at  9:00 AM EST by a video enabled telemedicine application and verified that I am speaking with the correct person using two identifiers.  Location: Patient: at home Provider: at office   I discussed the limitations of evaluation and management by telemedicine and the availability of in person appointments. The patient expressed understanding and agreed to proceed.  I discussed the assessment and treatment plan with the patient. The patient was provided an opportunity to ask questions and all were answered. The patient agreed with the plan and demonstrated an understanding of the instructions.   The patient was advised to call back or seek an in-person evaluation if the symptoms worsen or if the condition fails to improve as anticipated.  I provided 30 minutes of non-face-to-face time during this encounter.   Lori Jensen, M.Ed,CNA   Patient ID: Lori Jensen, female   DOB: [birth year], 53 y.o.   MRN: [id number] D:  As per previous CCA states:  Lori Jensen reports due to increased depression and recent SI. Stressors: 1) Family: daughter lives in [place] with 2yo grandson- she is unable to get to him often due to finances. Son's gf is pregnant but relationship with son is "strained". Stepfather relies on her "I'm the only person. He is a very negative and grumpy person. His life alert thing will call me." 2) MH: increased depression, anxiety, decreased ADLs. "There are so many things I am unable to do because of the depression." 3) recent SI: She reports she had thoughts of how she could OD on her meds 3 nights ago but denies any intent or thoughts at this time. 4) Physical health: Fibromyalgia which causes physical pain 5) Financial: on disability and money is tight- sometimes spends grocery money on kids or grandkids where she doesn't know how she will eat. She does have a church resource where she gets some help with food. 6)  Guilt related to needing help with food 7) In an unhealthy relationship with a man on/off for 19 years: he got married in the last 2 years and she is still seeing him: "I feel really bad about myself because I do that. There is a part of me that says he was mine before he was hers. I feel like I'm not good enough because I'm not the one he married." Treatment history includes 1 hospitalizations at [facility] sometime in the last 6 years (she could not remember when), [facility] 2019, [facility] 2019 & 2023, and [facility] at [facility]. She reports on/off therapy. She denies attempts, current SI/HI/AVH, drug use, NSSIB. She reports continued pSI. She reports she has had AVH in the past of hearing men outside, or a tv on in another room, or seeing someone pass by her when no one is there. She reports Dr. Vallorie believed these to be related to her PTSD, not psychosis. PF: grandchildren, children, dog. She reports family history includes Mom attempting suicide multiple times by cutting after telling Lori Jensen she was going into the bathroom to do so, and sister that does not get help. She lives alone with her dog. Her supports include her bff and neighbor. Medical diagnosis include fibromyalgia, degenerative disc issues, arthritis.  Pt stepped down from PHP to virtual MH-IOP today.  Scored 17 on the PHQ-9.  Denies SI/HI or A/V hallucinations.  Pt mentioned she still prefers in person meetings over virtual.  C/O anhedonia, sadness, poor sleep, low energy, decreased appetite,  low self-esteem, ruminating thoughts, and poor concentration.  Rates her depression at a 8 and anxiety at a 9 (Scale 1-10; 10 being the worst).  Pt sees Dr. Arfeen and Todd Brooks, LCSW. A:  Re-oriented pt.  Pt was advised of ROI must be obtained prior to any records release in order to collaborate her care with an outside provider.  Pt was advised if she has not already done so to contact the front desk to sign all necessary forms in order for MH-IOP to release info re: her  care.  Consent:  Pt gives verbal consent for tx and assignment of benefits for services provided during this telehealth group process.  Pt expressed understanding and agreed to proceed. Collaboration of care:  Collaborate with Staci Kerns, NP AEB, Dr. Curry  AEB; Darleene Ricker, LCSW AEB and Todd Humbles, LCSW, AEB. Encouraged support groups through local [employer], and [facility] [employer] [facility].   Pt will improve her mood as evidenced by being happy again, managing her mood and coping with daily stressors for 5 out of 7 days for 60 days.  R:  Pt receptive.  Lori Jensen, M.Ed,CNA

## 2024-08-10 ENCOUNTER — Other Ambulatory Visit (HOSPITAL_COMMUNITY): Attending: Psychiatry | Admitting: Psychiatry

## 2024-08-10 DIAGNOSIS — R45851 Suicidal ideations: Secondary | ICD-10-CM | POA: Insufficient documentation

## 2024-08-10 DIAGNOSIS — F411 Generalized anxiety disorder: Secondary | ICD-10-CM | POA: Insufficient documentation

## 2024-08-10 DIAGNOSIS — R4589 Other symptoms and signs involving emotional state: Secondary | ICD-10-CM | POA: Insufficient documentation

## 2024-08-10 DIAGNOSIS — Z79899 Other long term (current) drug therapy: Secondary | ICD-10-CM | POA: Insufficient documentation

## 2024-08-10 DIAGNOSIS — F3181 Bipolar II disorder: Secondary | ICD-10-CM | POA: Insufficient documentation

## 2024-08-10 NOTE — Progress Notes (Signed)
 Virtual Visit via Video Note   I connected with Lori Jensen on 08/10/24 at  9:00 AM EDT by a video enabled telemedicine application and verified that I am speaking with the correct person using two identifiers.   At orientation to the IOP program, Case Manager discussed the limitations of evaluation and management by telemedicine and the availability of in person appointments. The patient expressed understanding and agreed to proceed with virtual visits throughout the duration of the program.   Location:  Patient: Patient Home Provider: Home Office   History of Present Illness: Bipolar II Disorder   Observations/Objective: Check In: Case Manager checked in with all participants to review discharge dates, insurance authorizations, work-related documents and needs from the treatment team regarding medications. Lori Jensen stated needs and engaged in discussion.    Initial Therapeutic Activity: Counselor facilitated a check-in with Lori Jensen to assess for safety, sobriety and medication compliance.  Counselor also inquired about Lori Jensen's current emotional ratings, as well as any significant changes in thoughts, feelings or behavior since previous check in.  Lori Jensen presented for session on time and was alert, oriented x5, with no evidence or self-report of active SI/HI or A/V H.  Melissia reported compliance with medication and denied use of alcohol or illicit substances.  Lori Jensen reported scores of 8/10 for depression, 9/10 for anxiety, and 0/10 for anger/irritability.  Lori Jensen denied any recent outbursts or panic attacks.  Lori Jensen reported that a struggle was feeling exhausted after group yesterday, cancelling her PT appointment, and then sleeping. Lori Jensen reported that a success was getting in touch with her daughter and checking in with her about recent events.  Lori Jensen reported that her goal this weekend is to spend time with family in [place].          Second Therapeutic Activity: Counselor proposed  practice of guided imagery exercise with members today to improve their relaxation and stress management skills.  This visualization featured a casual walk through a nature trail in a forest on a pleasant fall day.  Counselor invited members to get comfortable, achieve a relaxing breathing pattern, and then began narration of this visualization, including various sensory details (i.e. feeling of the sun on the skin, smell of pine trees, sound of running stream and breeze through leaves) to enhance experience over course of activity.  Counselor processed experience afterward with each member, inquiring about effectiveness of activity, any issues that may have arisen, and whether they intend to add this to self-care routine.  Intervention was effective, as evidenced by Lori Jensen participating in activity successfully and reporting that she enjoyed it and felt mentally and physically relaxed afterward.  Lori Jensen reported that it brought up positive memories of her mother, who loved nature, and she felt like this could also help her fall asleep at night due to feelings of fatigue during participation.   Assessment and Plan: Counselor recommends that Lori Jensen remain in IOP treatment to better manage mental health symptoms, ensure stability and pursue completion of treatment plan goals. Counselor recommends adherence to crisis/safety plan, taking medications as prescribed, and following up with medical professionals if any issues arise.    Follow Up Instructions: Counselor will send [employer] Teams link for session tomorrow.  Lori Jensen was advised to call back or seek an in-person evaluation if the symptoms worsen or if the condition fails to improve as anticipated.   Collaboration of Care:   Medication Management AEB Staci Kerns, NP  Case Manager AEB Ricka Gaskins, CNA    Patient/Guardian was advised Release of Information must be obtained prior to any record release in order to  collaborate their care with an outside provider. Patient/Guardian was advised if they have not already done so to contact the registration department to sign all necessary forms in order for us  to release information regarding their care.    Consent: Patient/Guardian gives verbal consent for treatment and assignment of benefits for services provided during this visit. Patient/Guardian expressed understanding and agreed to proceed.   I provided 180 minutes of non-face-to-face time during this encounter.   Darleene Ricker, [place], LCAS 08/10/24

## 2024-08-10 NOTE — Progress Notes (Deleted)
 Psychiatric Initial Child/Adolescent Assessment   Patient Identification: Lori Jensen MRN:  [id number] Date of Evaluation:  08/10/2024 Referral Source: *** Chief Complaint:  No chief complaint on file.  Visit Diagnosis: No diagnosis found.  History of Present Illness:: ***  Associated Signs/Symptoms: Depression Symptoms:  {DEPRESSION SYMPTOMS:20000} (Hypo) Manic Symptoms:  {BHH MANIC SYMPTOMS:22872} Anxiety Symptoms:  {BHH ANXIETY SYMPTOMS:22873} Psychotic Symptoms:  {BHH PSYCHOTIC SYMPTOMS:22874} PTSD Symptoms: {BHH PTSD SYMPTOMS:22875}  Past Psychiatric History: ***  Previous Psychotropic Medications: {YES/NO:21197}  Substance Abuse History in the last 12 months:  {yes no:314532}  Consequences of Substance Abuse: {BHH CONSEQUENCES OF SUBSTANCE ABUSE:22880}  Past Medical History:  Past Medical History:  Diagnosis Date  . Anxiety   . Bipolar disorder (HCC)   . Depression   . Dysmenorrhea   . Fibromyalgia 11/2017  . Heart murmur   . Hypertension   . PONV (postoperative nausea and vomiting)   . PTSD (post-traumatic stress disorder)   . Uterine fibroid     Past Surgical History:  Procedure Laterality Date  . DILATION AND CURETTAGE OF UTERUS    . DILATION AND CURETTAGE OF UTERUS  01/16/2013   Procedure: DILATATION AND CURETTAGE;  Surgeon: Peggye Gull, MD;  Location: [facility];  Service: Gynecology;;  . ENDOMETRIAL ABLATION    . uterine ablation  08/13/2011    Family Psychiatric History: ***  Family History:  Family History  Problem Relation Age of Onset  . Heart disease Mother   . Anxiety disorder Mother   . Depression Mother   . Stomach cancer Father   . Heart disease Father   . Other Father        non hodgkins lymphoma  . Alcohol abuse Maternal Aunt   . Drug abuse Maternal Aunt   . Alcohol abuse Brother   . Alcohol abuse Maternal Uncle   . Alcohol abuse Paternal Uncle   . Colon cancer Neg Hx     Social History:   Social History   Socioeconomic  History  . Marital status: Divorced    Spouse name: Not on file  . Number of children: 2  . Years of education: Not on file  . Highest education level: GED or equivalent  Occupational History  . Occupation: MORTGAGE Paramedic: [employer]  Tobacco Use  . Smoking status: Former    Current packs/day: 0.00    Types: Cigarettes, E-cigarettes    Start date: 08/13/1996    Quit date: 08/13/2016    Years since quitting: 7.9  . Smokeless tobacco: Never  . Tobacco comments:    Reports she does Vap occasionally   Vaping Use  . Vaping status: Every Day  . Start date: 03/27/2018  . Substances: Nicotine, Flavoring  . Devices: njoy  Substance and Sexual Activity  . Alcohol use: Not Currently    Comment: occasionally  . Drug use: No  . Sexual activity: Yes    Partners: Male    Birth control/protection: None, Surgical  Other Topics Concern  . Not on file  Social History Narrative   Reports at times has been emotionally mistreated by partner but denies any other issues.    Social Drivers of Health   Financial Resource Strain: Medium Risk (05/18/2024)   Received from [facility]   Overall Financial Resource Strain (CARDIA)   . How hard is it for you to pay for the very basics like food, housing, medical care, and heating?: Somewhat hard  Food Insecurity: Food Insecurity Present (05/18/2024)  Received from [facility]   Hunger Vital Sign   . Within the past 12 months, you worried that your food would run out before you got the money to buy more.: Often true   . Within the past 12 months, the food you bought just didn't last and you didn't have money to get more.: Often true  Transportation Needs: No Transportation Needs (05/18/2024)   Received from [facility] - Transportation   . In the past 12 months, has lack of transportation kept you from medical appointments or from getting medications?: No   . In the past 12 months, has lack of transportation kept  you from meetings, work, or from getting things needed for daily living?: No  Physical Activity: Insufficiently Active (05/18/2024)   Received from [facility]   Exercise Vital Sign   . On average, how many days per week do you engage in moderate to strenuous exercise (like a brisk walk)?: 2 days   . On average, how many minutes do you engage in exercise at this level?: 10 min  Stress: Stress Concern Present (05/18/2024)   Received from [facility] - Occupational Stress Questionnaire   . Do you feel stress - tense, restless, nervous, or anxious, or unable to sleep at night because your mind is troubled all the time - these days?: Very much  Social Connections: Somewhat Isolated (05/18/2024)   Received from [facility]   Social Network   . How would you rate your social network (family, work, friends)?: Restricted participation with some degree of social isolation    Additional Social History: ***   Developmental History: Prenatal History: *** Birth History: *** Postnatal Infancy: *** Developmental History: *** Milestones: Sit-Up: *** Crawl: *** Walk: *** Speech: *** School History: *** Legal History: *** Hobbies/Interests: ***  Allergies:   Allergies  Allergen Reactions  . Codeine Anaphylaxis  . Meloxicam  Other (See Comments)    dyspepsia  . Amoxicillin Rash    Has patient had a PCN reaction causing immediate rash, facial/tongue/throat swelling, SOB or lightheadedness with hypotension: no Has patient had a PCN reaction causing severe rash involving mucus membranes or skin necrosis: no Has patient had a PCN reaction that required hospitalization no Has patient had a PCN reaction occurring within the last 10 years: yes If all of the above answers are NO, then may proceed with Cephalosporin use.   SABRA Doxycycline Rash  . Lamictal  [Lamotrigine ] Rash    Metabolic Disorder Labs: No results found for: HGBA1C, MPG No results  found for: PROLACTIN No results found for: CHOL, TRIG, HDL, CHOLHDL, VLDL, LDLCALC No results found for: TSH  Therapeutic Level Labs: No results found for: LITHIUM  No results found for: CBMZ No results found for: VALPROATE  Current Medications: Current Outpatient Medications  Medication Sig Dispense Refill  . amitriptyline (ELAVIL) 25 MG tablet Take 25 mg by mouth at bedtime.    . buPROPion  (WELLBUTRIN  XL) 300 MG 24 hr tablet Take 1 tablet (300 mg total) by mouth daily. 90 tablet 0  . celecoxib (CELEBREX) 100 MG capsule Take 100 mg by mouth.    . Cholecalciferol 1.25 MG (50000 UT) capsule Take 1 capsule by mouth once a week.    . clonazePAM  (KLONOPIN ) 1 MG tablet Take 1 tablet (1 mg total) by mouth 2 (two) times daily as needed for anxiety. 30 tablet 2  . diclofenac Sodium (VOLTAREN) 1 % GEL Apply topically.    SABRA  fluticasone (FLONASE) 50 MCG/ACT nasal spray Place 1 spray into both nostrils daily.    . gabapentin  (NEURONTIN ) 600 MG tablet Take 1 tablet (600 mg total) by mouth 3 (three) times daily. 90 tablet 2  . hydrochlorothiazide (HYDRODIURIL) 12.5 MG tablet Take 12.5 mg by mouth daily.    . hydrocortisone (ANUSOL-HC) 2.5 % rectal cream Apply rectally 2 times daily    . methocarbamol  (ROBAXIN ) 500 MG tablet Take 500 mg by mouth every 6 (six) hours as needed.    . ondansetron  (ZOFRAN ) 4 MG tablet Take 4 mg by mouth every 8 (eight) hours as needed for nausea.    . pantoprazole  (PROTONIX ) 20 MG tablet Take 1 tablet (20 mg total) by mouth 2 (two) times daily. 30 tablet 0  . potassium chloride  (KLOR-CON ) 10 MEQ tablet Take 1 tablet (10 mEq total) by mouth daily for 30 doses. (Patient not taking: Reported on 08/09/2024) 30 tablet 0  . PROAIR  HFA 108 (90 Base) MCG/ACT inhaler Inhale 1-2 puffs into the lungs every 6 (six) hours as needed for wheezing or shortness of breath.  2  . tirzepatide (ZEPBOUND) 5 MG/0.5ML Pen Inject 5 mg into the skin once a week.    . topiramate  (TOPAMAX) 25 MG tablet Take 1 tablet (25 mg total) by mouth daily. 30 tablet 0   No current facility-administered medications for this visit.    Musculoskeletal: Strength & Muscle Tone: {desc; muscle tone:32375} Gait & Station: {PE GAIT ED WJUO:77474} Patient leans: {Patient Leans:21022755}  Psychiatric Specialty Exam: Review of Systems  There were no vitals taken for this visit.There is no height or weight on file to calculate BMI.  General Appearance: {Appearance:22683}  Eye Contact:  {BHH EYE CONTACT:22684}  Speech:  {Speech:22685}  Volume:  {Volume (PAA):22686}  Mood:  {BHH MOOD:22306}  Affect:  {Affect (PAA):22687}  Thought Process:  {Thought Process (PAA):22688}  Orientation:  {BHH ORIENTATION (PAA):22689}  Thought Content:  {Thought Content:22690}  Suicidal Thoughts:  {ST/HT (PAA):22692}  Homicidal Thoughts:  {ST/HT (PAA):22692}  Memory:  {BHH MEMORY:22881}  Judgement:  {Judgement (PAA):22694}  Insight:  {Insight (PAA):22695}  Psychomotor Activity:  {Psychomotor (PAA):22696}  Concentration: {Concentration:21399}  Recall:  {BHH GOOD/FAIR/POOR:22877}  Fund of Knowledge: {BHH GOOD/FAIR/POOR:22877}  Language: {BHH GOOD/FAIR/POOR:22877}  Akathisia:  {BHH YES OR NO:22294}  Handed:  {Handed:22697}  AIMS (if indicated):  {Desc; done/not:10129}  Assets:  {Assets (PAA):22698}  ADL's:  {BHH JIO'D:77709}  Cognition: {chl bhh cognition:304700322}  Sleep:  {BHH GOOD/FAIR/POOR:22877}   Screenings: GAD-7    Advertising Copywriter from 07/16/2024 in [facility] Counselor from 10/04/2017 in BEHAVIORAL HEALTH PARTIAL HOSPITALIZATION PROGRAM Counselor from 09/08/2017 in BEHAVIORAL HEALTH PARTIAL HOSPITALIZATION PROGRAM Counselor from 03/04/2017 in BEHAVIORAL HEALTH PARTIAL HOSPITALIZATION PROGRAM Counselor from 02/22/2017 in BEHAVIORAL HEALTH PARTIAL HOSPITALIZATION PROGRAM  Total GAD-7 Score 16 17 19 12 20    PHQ2-9    Flowsheet Row Counselor from  08/09/2024 in BEHAVIORAL HEALTH INTENSIVE PSYCH Counselor from 07/16/2024 in [facility] Counselor from 07/10/2024 in [facility] Counselor from 06/28/2024 in [facility] Video Visit from [facility]/17/2023 in [facility]  PHQ-2 Total Score 4 6 4 4 5   PHQ-9 Total Score 17 21 18 18 19    Flowsheet Row Counselor from 08/09/2024 in BEHAVIORAL HEALTH INTENSIVE PSYCH Counselor from 06/28/2024 in [facility] UC from 02/12/2024 in [facility])  C-SSRS RISK CATEGORY Error: Question 6 not populated High  Risk Error: Question 6 not populated    Assessment and Plan: ***  Collaboration of Care: {BH OP Collaboration of Care:21014065}  Patient/Guardian was advised Release of Information must be obtained prior to any record release in order to collaborate their care with an outside provider. Patient/Guardian was advised if they have not already done so to contact the registration department to sign all necessary forms in order for us  to release information regarding their care.   Consent: Patient/Guardian gives verbal consent for treatment and assignment of benefits for services provided during this visit. Patient/Guardian expressed understanding and agreed to proceed.   Staci LOISE Kerns, NP 11/14/20258:47 AM

## 2024-08-10 NOTE — Progress Notes (Addendum)
 Virtual Visit via Video Note  I connected with Lori Jensen on 08/10/24 at  9:00 AM EST by a video enabled telemedicine application and verified that I am speaking with the correct person using two identifiers.  Location: Patient: Home Provider: office    I discussed the limitations of evaluation and management by telemedicine and the availability of in person appointments. The patient expressed understanding and agreed to proceed.  I discussed the assessment and treatment plan with the patient. The patient was provided an opportunity to ask questions and all were answered. The patient agreed with the plan and demonstrated an understanding of the instructions.   The patient was advised to call back or seek an in-person evaluation if the symptoms worsen or if the condition fails to improve as anticipated.  I provided 15  minutes of non-face-to-face time during this encounter.   Staci LOISE Kerns, NP    Psychiatric Initial Adult Assessment   Patient Identification: Lori Jensen MRN:  [id number] Date of Evaluation:  08/10/2024 Referral Source: [place], MD Chief Complaint:  Steeping down from partial Outpatient Programming  Visit Diagnosis:    ICD-10-CM   1. Bipolar 2 disorder (HCC)  F31.81     2. Difficulty coping  R45.89        History of Present Illness:  Lori Jensen is a 53 year old female who presents due to worsening depression and anxiety symptoms.  Patient carries a diagnosis related to depression, anxiety and bipolar disorder.  She is currently prescribed Wellbutrin , Klonopin  and amitriptyline.  She reports she is taking and tolerating medications well reports experiencing hopelessness, increased depression and suicidal ideations with a plan to overdose.  Reports struggling with self-esteem issues and reports she is codependent.  She reports she and her son who is 24 has a slightly strained relationship.  States she has a daughter who is 75 who resides in [place]   does not understand mental illness which can be frustrating at times when she attempts to have conversations with therapist.  Lori Jensen reports limited support.  Does report she has a significant other  complicated denied that she is currently employed.  States she is been disabled for the past 24 years.  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  States she was previously hospitalized 7 years ago at [facility] due to similar symptoms related to hopelessness and increased depression.  Patient to start Intensive Outpatient Programming on 08/10/2022  Evaluation:  Lori Jensen 53 year old female recently completed partial hospitalization outpatient programming.  Stepping down to intensive outpatient programming.  States overall she feels her mood is stabilized.  Reports she has been in contact with her primary care doctor and weight loss management clinic to restart weight loss medications Zepbound to which she initially reports was causing intense suicidal ideations.  States she is feels like she is in a lot better headspace.  She denied any suicidal or homicidal ideations during this assessment.  Denies auditory or visual hallucinations.  Patient does report symptoms of worry and depressed mood related to learning about a passing of a friend who completed suicide.  States she has been able to be supportive as she also lost her brother 11 years prior.  Denied concerns related to medication refills.  Patient to continue outpatient services.  Support, encouragement and reassurance was provided.   Associated Signs/Symptoms: Depression Symptoms:  depressed mood, anhedonia, difficulty concentrating, hopelessness, (Hypo) Manic Symptoms:  Distractibility, Anxiety Symptoms:  Excessive Worry, Psychotic Symptoms:  Hallucinations: None PTSD Symptoms:  NA  Past Psychiatric History: Previous inpatient admissions 7+ years ago.  States she was hospitalized at a [facility].  Diagnoses with major  depressive disorder, generalized anxiety disorder and bipolar disorder.  Currently prescribed gabapentin , Klonopin , Wellbutrin  and amitriptyline.  Previous Psychotropic Medications: Yes   Substance Abuse History in the last 12 months:  No.  Consequences of Substance Abuse: NA  Past Medical History:  Past Medical History:  Diagnosis Date   Anxiety    Bipolar disorder (HCC)    Depression    Dysmenorrhea    Fibromyalgia 11/2017   Heart murmur    Hypertension    PONV (postoperative nausea and vomiting)    PTSD (post-traumatic stress disorder)    Uterine fibroid     Past Surgical History:  Procedure Laterality Date   DILATION AND CURETTAGE OF UTERUS     DILATION AND CURETTAGE OF UTERUS  01/16/2013   Procedure: DILATATION AND CURETTAGE;  Surgeon: Peggye Gull, MD;  Location: [facility];  Service: Gynecology;;   ENDOMETRIAL ABLATION     uterine ablation  08/13/2011    Family Psychiatric History: Reports undiagnosed, (mother and sister)  Family History:  Family History  Problem Relation Age of Onset   Heart disease Mother    Anxiety disorder Mother    Depression Mother    Stomach cancer Father    Heart disease Father    Other Father        non hodgkins lymphoma   Alcohol abuse Maternal Aunt    Drug abuse Maternal Aunt    Alcohol abuse Brother    Alcohol abuse Maternal Uncle    Alcohol abuse Paternal Uncle    Colon cancer Neg Hx     Social History:   Social History   Socioeconomic History   Marital status: Divorced    Spouse name: Not on file   Number of children: 2   Years of education: Not on file   Highest education level: GED or equivalent  Occupational History   Occupation: MORTGAGE Paramedic: [employer]  Tobacco Use   Smoking status: Former    Current packs/day: 0.00    Types: Cigarettes, E-cigarettes    Start date: 08/13/1996    Quit date: 08/13/2016    Years since quitting: 7.9   Smokeless tobacco: Never   Tobacco comments:     Reports she does Vap occasionally   Vaping Use   Vaping status: Every Day   Start date: 03/27/2018   Substances: Nicotine, Flavoring   Devices: njoy  Substance and Sexual Activity   Alcohol use: Not Currently    Comment: occasionally   Drug use: No   Sexual activity: Yes    Partners: Male    Birth control/protection: None, Surgical  Other Topics Concern   Not on file  Social History Narrative   Reports at times has been emotionally mistreated by partner but denies any other issues.    Social Drivers of Health   Financial Resource Strain: Medium Risk (05/18/2024)   Received from [facility]   Overall Financial Resource Strain (CARDIA)    How hard is it for you to pay for the very basics like food, housing, medical care, and heating?: Somewhat hard  Food Insecurity: Food Insecurity Present (05/18/2024)   Received from [facility]   Hunger Vital Sign    Within the past 12 months, you worried that your food would run out before you got the money to buy more.: Often true    Within  the past 12 months, the food you bought just didn't last and you didn't have money to get more.: Often true  Transportation Needs: No Transportation Needs (05/18/2024)   Received from [facility] - Transportation    In the past 12 months, has lack of transportation kept you from medical appointments or from getting medications?: No    In the past 12 months, has lack of transportation kept you from meetings, work, or from getting things needed for daily living?: No  Physical Activity: Insufficiently Active (05/18/2024)   Received from [facility]   Exercise Vital Sign    On average, how many days per week do you engage in moderate to strenuous exercise (like a brisk walk)?: 2 days    On average, how many minutes do you engage in exercise at this level?: 10 min  Stress: Stress Concern Present (05/18/2024)   Received from [facility] - Occupational  Stress Questionnaire    Do you feel stress - tense, restless, nervous, or anxious, or unable to sleep at night because your mind is troubled all the time - these days?: Very much  Social Connections: Somewhat Isolated (05/18/2024)   Received from [facility]   Social Network    How would you rate your social network (family, work, friends)?: Restricted participation with some degree of social isolation    Additional Social History:   Allergies:   Allergies  Allergen Reactions   Codeine Anaphylaxis   Meloxicam  Other (See Comments)    dyspepsia   Amoxicillin Rash    Has patient had a PCN reaction causing immediate rash, facial/tongue/throat swelling, SOB or lightheadedness with hypotension: no Has patient had a PCN reaction causing severe rash involving mucus membranes or skin necrosis: no Has patient had a PCN reaction that required hospitalization no Has patient had a PCN reaction occurring within the last 10 years: yes If all of the above answers are NO, then may proceed with Cephalosporin use.    Doxycycline Rash   Lamictal  [Lamotrigine ] Rash    Metabolic Disorder Labs: No results found for: HGBA1C, MPG No results found for: PROLACTIN No results found for: CHOL, TRIG, HDL, CHOLHDL, VLDL, LDLCALC No results found for: TSH  Therapeutic Level Labs: No results found for: LITHIUM  No results found for: CBMZ No results found for: VALPROATE  Current Medications: Current Outpatient Medications  Medication Sig Dispense Refill   amitriptyline (ELAVIL) 25 MG tablet Take 25 mg by mouth at bedtime.     buPROPion  (WELLBUTRIN  XL) 300 MG 24 hr tablet Take 1 tablet (300 mg total) by mouth daily. 90 tablet 0   celecoxib (CELEBREX) 100 MG capsule Take 100 mg by mouth.     Cholecalciferol 1.25 MG (50000 UT) capsule Take 1 capsule by mouth once a week.     clonazePAM  (KLONOPIN ) 1 MG tablet Take 1 tablet (1 mg total) by mouth 2 (two) times daily as needed for  anxiety. 30 tablet 2   diclofenac Sodium (VOLTAREN) 1 % GEL Apply topically.     fluticasone (FLONASE) 50 MCG/ACT nasal spray Place 1 spray into both nostrils daily.     gabapentin  (NEURONTIN ) 600 MG tablet Take 1 tablet (600 mg total) by mouth 3 (three) times daily. 90 tablet 2   hydrochlorothiazide (HYDRODIURIL) 12.5 MG tablet Take 12.5 mg by mouth daily.     hydrocortisone (ANUSOL-HC) 2.5 % rectal cream Apply rectally 2 times daily     methocarbamol  (ROBAXIN ) 500  MG tablet Take 500 mg by mouth every 6 (six) hours as needed.     ondansetron  (ZOFRAN ) 4 MG tablet Take 4 mg by mouth every 8 (eight) hours as needed for nausea.     pantoprazole  (PROTONIX ) 20 MG tablet Take 1 tablet (20 mg total) by mouth 2 (two) times daily. 30 tablet 0   potassium chloride  (KLOR-CON ) 10 MEQ tablet Take 1 tablet (10 mEq total) by mouth daily for 30 doses. (Patient not taking: Reported on 08/09/2024) 30 tablet 0   PROAIR  HFA 108 (90 Base) MCG/ACT inhaler Inhale 1-2 puffs into the lungs every 6 (six) hours as needed for wheezing or shortness of breath.  2   tirzepatide (ZEPBOUND) 5 MG/0.5ML Pen Inject 5 mg into the skin once a week.     topiramate (TOPAMAX) 25 MG tablet Take 1 tablet (25 mg total) by mouth daily. 30 tablet 0   No current facility-administered medications for this visit.    Musculoskeletal: Strength & Muscle Tone: within normal limits Gait & Station: normal Patient leans: N/A  Psychiatric Specialty Exam: Review of Systems  Psychiatric/Behavioral:  Positive for decreased concentration and sleep disturbance. Negative for suicidal ideas.     There were no vitals taken for this visit.There is no height or weight on file to calculate BMI.  General Appearance: Casual  Eye Contact:  Good  Speech:  Clear and Coherent  Volume:  Normal  Mood:  Anxious and Depressed  Affect:  Congruent  Thought Process:  Coherent  Orientation:  Full (Time, Place, and Person)  Thought Content:  Logical  Suicidal  Thoughts:  No  Homicidal Thoughts:  No  Memory:  Immediate;   Good Remote;   Good  Judgement:  Good  Insight:  Good  Psychomotor Activity:  Normal  Concentration:  Concentration: Good  Recall:  Good  Fund of Knowledge:Good  Language: Good  Akathisia:  No  Handed:  Right  AIMS (if indicated):  not done  Assets:  Communication Skills Desire for Improvement  ADL's:  Intact  Cognition: WNL  Sleep:  Good   Screenings: GAD-7    Advertising Copywriter from 07/16/2024 in [facility] Counselor from 10/04/2017 in BEHAVIORAL HEALTH PARTIAL HOSPITALIZATION PROGRAM Counselor from 09/08/2017 in BEHAVIORAL HEALTH PARTIAL HOSPITALIZATION PROGRAM Counselor from 03/04/2017 in BEHAVIORAL HEALTH PARTIAL HOSPITALIZATION PROGRAM Counselor from 02/22/2017 in BEHAVIORAL HEALTH PARTIAL HOSPITALIZATION PROGRAM  Total GAD-7 Score 16 17 19 12 20    PHQ2-9    Flowsheet Row Counselor from 08/09/2024 in BEHAVIORAL HEALTH INTENSIVE PSYCH Counselor from 07/16/2024 in [facility] Counselor from 07/10/2024 in [facility] Counselor from 06/28/2024 in [facility] Video Visit from [facility]/17/2023 [facility]  PHQ-2 Total Score 4 6 4 4 5   PHQ-9 Total Score 17 21 18 18 19    Flowsheet Row Counselor from 08/09/2024 in BEHAVIORAL HEALTH INTENSIVE PSYCH Counselor from 06/28/2024 in [facility] UC from 02/12/2024 in [facility]  C-SSRS RISK CATEGORY Error: Question 6 not populated High Risk Error: Question 6 not populated    Assessment and Plan:  Lori Jensen  was enrolled in Partial Hospitalization Program, patient's current medications are to be continued, the following medications are being continued and a comprehensive treatment plan will be developed and side effects of medications have been reviewed  with patient  Treatment options and alternatives reviewed with patient and patient understands the above  plan. Treatment plan was reviewed and agreed upon by NP T.Ezzard and patient Lori Jensen need for group services.  Collaboration of Care: Medication Management AEB continue medications as directed  Patient/Guardian was advised Release of Information must be obtained prior to any record release in order to collaborate their care with an outside provider. Patient/Guardian was advised if they have not already done so to contact the registration department to sign all necessary forms in order for us  to release information regarding their care.   Consent: Patient/Guardian gives verbal consent for treatment and assignment of benefits for services provided during this visit. Patient/Guardian expressed understanding and agreed to proceed.   Staci LOISE Ezzard, NP 11/14/20259:31 AM

## 2024-08-13 ENCOUNTER — Other Ambulatory Visit (HOSPITAL_COMMUNITY): Admitting: Psychiatry

## 2024-08-13 ENCOUNTER — Telehealth (HOSPITAL_COMMUNITY): Payer: Self-pay | Admitting: Psychiatry

## 2024-08-13 ENCOUNTER — Encounter (HOSPITAL_COMMUNITY): Payer: Self-pay

## 2024-08-13 NOTE — Telephone Encounter (Signed)
 D:  Pt sent the [facility] Case Mgr an email this morning stating she wouldn't be attending group d/t a headache.  A:  Inform treatment team.

## 2024-08-13 NOTE — Therapy (Signed)
 [facility] [address] [place], [place], [address] Phone: [phone]   Fax:  [phone]  Occupational Therapy Treatment  Patient Details  Name: Lori Jensen MRN: [id number] Date of Birth: 12-Apr-1971 No data recorded  Encounter Date: 07/31/2024   OT End of Session - 08/13/24 2119     Visit Number 8    Number of Visits 15    Date for Recertification  07/20/24    OT Start Time 1230    OT Stop Time 1330    OT Time Calculation (min) 60 min          Past Medical History:  Diagnosis Date   Anxiety    Bipolar disorder (HCC)    Depression    Dysmenorrhea    Fibromyalgia 11/2017   Heart murmur    Hypertension    PONV (postoperative nausea and vomiting)    PTSD (post-traumatic stress disorder)    Uterine fibroid     Past Surgical History:  Procedure Laterality Date   DILATION AND CURETTAGE OF UTERUS     DILATION AND CURETTAGE OF UTERUS  01/16/2013   Procedure: DILATATION AND CURETTAGE;  Surgeon: Peggye Gull, MD;  Location: [facility];  Service: Gynecology;;   ENDOMETRIAL ABLATION     uterine ablation  08/13/2011    There were no vitals filed for this visit.   Subjective Assessment - 08/13/24 2119     Currently in Pain? No/denies    Pain Score 0-No pain               Group Session:  S: Feeling a bit better today. Getting better sleep.   O: The primary objective of this topic is to explore and understand the concept of occupational balance in the context of daily living. The term occupational balance is defined broadly, encompassing all activities that occupy an individual's time and energy, including self-care, leisure, and work-related tasks. The goal is to guide participants towards achieving a harmonious blend of these activities, tailored to their personal values and life circumstances. This balance is aimed at enhancing overall well-being, not by equally distributing time across activities, but by ensuring that daily  engagements are fulfilling and not draining. The content delves into identifying various barriers that individuals face in achieving occupational balance, such as overcommitment, misaligned priorities, external pressures, and lack of effective time management. The impact of these barriers on occupational performance, roles, and lifestyles is examined, highlighting issues like reduced efficiency, strained relationships, and potential health problems. Strategies for cultivating occupational balance are a key focus. These strategies include practical methods like time blocking, prioritizing tasks, establishing self-care rituals, decluttering, connecting with nature, and engaging in reflective practices. These approaches are designed to be adaptable and applicable to a wide range of life scenarios, promoting a proactive and mindful approach to daily living. The overall aim is to equip participants with the knowledge and tools to create a balanced lifestyle that supports their mental, emotional, and physical health, thereby improving their functional performance in daily life.   A:  The patient demonstrated a high level of engagement and active participation throughout the session on occupational balance. The patient frequently contributed to discussions, offering insightful reflections on personal experiences related to the barriers and strategies for achieving occupational balance. There was a clear understanding of the concept and an ability to relate it to their own life. The patient showed enthusiasm in learning and applying the strategies discussed, such as time blocking and self-care rituals, indicating a  strong motivation to improve their occupational balance. The patient's proactive approach and responsiveness to the topic suggest a high potential for implementing these strategies effectively in their daily routine.    P: Continue to attend PHP OT group sessions 5x week for 4 weeks to promote daily  structure, social engagement, and opportunities to develop and utilize adaptive strategies to maximize functional performance in preparation for safe transition and integration back into school, work, and the community. Plan to address topic of tbd in next OT group session.                 OT Education - 08/13/24 2119     Education Details Occupational Balance 3           OT Short Term Goals - 07/08/24 2058       OT SHORT TERM GOAL #1   Title Patient will be educated on strategies to improve psychosocial skills needed to participate fully in all daily, work, and leisure activities.    Time 3    Period Weeks    Status On-going    Target Date 07/20/24      OT SHORT TERM GOAL #2   Title Patient will be educated on a HEP and independent with implementation of HEP.    Status On-going      OT SHORT TERM GOAL #3   Title Patient will independently apply psychosocial skills and coping mechanisms to her daily activities in order to function independently.    Status On-going                   Plan - 08/13/24 2120     Psychosocial Skills Coping Strategies;Habits;Environmental  Adaptations;Interpersonal Interaction;Routines and Behaviors          Patient will benefit from skilled therapeutic intervention in order to improve the following deficits and impairments:       Psychosocial Skills: Coping Strategies, Habits, Environmental  Adaptations, Interpersonal Interaction, Routines and Behaviors   Visit Diagnosis: Difficulty coping    Problem List Patient Active Problem List   Diagnosis Date Noted   Bipolar 2 disorder (HCC) 04/07/2022   PTSD (post-traumatic stress disorder) 04/07/2022   Vulval cellulitis 03/25/2018   Vaginal discharge 03/25/2018   Candidiasis of mouth 03/25/2018   Abscess of vulva 03/25/2018   Migraine 12/03/2017   Neck fullness 09/21/2017   Irregular menstrual bleeding 11/10/2016   Moderate episode of recurrent major depressive  disorder (HCC) 11/06/2015   Obesity (BMI 30-39.9) 11/03/2015   Myofascial pain 11/03/2015   Essential hypertension 11/03/2015   SMOKER 07/23/2009   PALPITATIONS 06/12/2009   PANIC ATTACKS 11/24/2006   DEPRESSIVE DISORDER, NOS 11/24/2006   MITRAL VALVE PROLAPSE 11/24/2006   CHEST PAIN 11/24/2006    [place] KANDICE Purpura, OT 08/13/2024, 9:20 PM  D[place] Purpura, OT   [facility] [address] [place], [place], [address] Phone: [phone]   Fax:  [phone]  Name: Lori Jensen MRN: [id number] Date of Birth: Jan 20, 1971

## 2024-08-14 ENCOUNTER — Other Ambulatory Visit (HOSPITAL_COMMUNITY): Attending: Psychiatry | Admitting: Licensed Clinical Social Worker

## 2024-08-14 DIAGNOSIS — F3181 Bipolar II disorder: Secondary | ICD-10-CM | POA: Insufficient documentation

## 2024-08-14 NOTE — Progress Notes (Signed)
 Virtual Visit via Video Note   I connected with Marva K. Ciotti on 08/14/24 at  9:00 AM EDT by a video enabled telemedicine application and verified that I am speaking with the correct person using two identifiers.   At orientation to the IOP program, Case Manager discussed the limitations of evaluation and management by telemedicine and the availability of in person appointments. The patient expressed understanding and agreed to proceed with virtual visits throughout the duration of the program.   Location:  Patient: Patient Home Provider: [facility]   History of Present Illness: Bipolar II Disorder   Observations/Objective: Check In: Case Manager checked in with all participants to review discharge dates, insurance authorizations, work-related documents and needs from the treatment team regarding medications. Ena stated needs and engaged in discussion.    Initial Therapeutic Activity: Counselor facilitated a check-in with Luanne to assess for safety, sobriety and medication compliance.  Counselor also inquired about Ahana's current emotional ratings, as well as any significant changes in thoughts, feelings or behavior since previous check in.  Akirah presented for session on time and was alert, oriented x5, with no evidence or self-report of active SI/HI or A/V H.  Kharter reported compliance with medication and denied use of alcohol or illicit substances.  Shaneta reported scores of 8/10 for depression, 8/10 for anxiety, and 7/10 for irritability.  Nyema denied any recent outbursts or panic attacks.  Vaidehi reported that a struggle was having a bad headache yesterday, which led her to miss group in order to recover, stating "Its been heavy".  Twilla reported that a success was deciding to look into [facility] to increase group support.  Dalena reported that her goal today is to do attend a physical therapy appointment in the afternoon.     Second Therapeutic Activity: Counselor  provided psychoeducation on subject of boundaries with group members today using a virtual handout.  This handout defined boundaries as the limits and rules that we set for ourselves within relationships, and featured a breakdown of the 3 common categories of boundaries (i.e. porous, rigid, and healthy), along with typical traits specific to each one for easy identification.  It was noted that most people have a mixture of different boundary types depending on setting, person, and culture.  Additional information was provided on the types of boundaries (i.e. physical, intellectual, emotional, sexual, material, and time) within relationships, and what could be considered healthy versus unhealthy. Counselor tasked members with identifying what types of boundaries they presently hold within her own support systems, the collective impact these boundaries have upon their mental health, and changes that could be made in order to more effectively communicate individual mental health needs.  Intervention was effective, as evidenced by Nat actively engaging in discussion on topic, reporting that she has rigid boundaries when it comes to dating due to past trauma with men, and this includes traits such as avoiding intimacy and closeness, appearing detached, and protective of personal information.  She reported that with some supports, such as her son, she has porous boundaries and is very dependent on his opinion of her, and has been overinvolved in his personal life.  Rayneisha reported that she would work to improve boundaries in her network by engaging in trauma therapy once she finishes [facility], as much of her issues around this subject relate to fear of experiencing more loss in her life.     Assessment and Plan: Counselor recommends that Jolean remain in IOP treatment to better manage mental  health symptoms, ensure stability and pursue completion of treatment plan goals. Counselor recommends adherence to crisis/safety  plan, taking medications as prescribed, and following up with medical professionals if any issues arise.    Follow Up Instructions: Counselor will send [employer] Teams link for session tomorrow.  Terilyn was advised to call back or seek an in-person evaluation if the symptoms worsen or if the condition fails to improve as anticipated.   Collaboration of Care:   Medication Management AEB Staci Kerns, NP                                           Case Manager AEB Ricka Gaskins, CNA    Patient/Guardian was advised Release of Information must be obtained prior to any record release in order to collaborate their care with an outside provider. Patient/Guardian was advised if they have not already done so to contact the registration department to sign all necessary forms in order for us  to release information regarding their care.    Consent: Patient/Guardian gives verbal consent for treatment and assignment of benefits for services provided during this visit. Patient/Guardian expressed understanding and agreed to proceed.   I provided 180 minutes of non-face-to-face time during this encounter.   Darleene Ricker, [place], LCAS 08/14/24

## 2024-08-15 ENCOUNTER — Other Ambulatory Visit (HOSPITAL_COMMUNITY): Attending: Psychiatry | Admitting: Licensed Clinical Social Worker

## 2024-08-15 DIAGNOSIS — F3181 Bipolar II disorder: Secondary | ICD-10-CM | POA: Insufficient documentation

## 2024-08-15 MED ORDER — TOPIRAMATE 25 MG PO TABS: 25.0000 mg | ORAL_TABLET | Freq: Every day | ORAL | 0 refills | Status: DC

## 2024-08-15 NOTE — Progress Notes (Signed)
 Virtual Visit via Video Note   I connected with Lori Jensen on 08/15/24 at  9:00 AM EDT by a video enabled telemedicine application and verified that I am speaking with the correct person using two identifiers.   At orientation to the IOP program, Case Manager discussed the limitations of evaluation and management by telemedicine and the availability of in person appointments. The patient expressed understanding and agreed to proceed with virtual visits throughout the duration of the program.   Location:  Patient: Patient Home Provider: Home Office   History of Present Illness: Bipolar II Disorder   Observations/Objective: Check In: Case Manager checked in with all participants to review discharge dates, insurance authorizations, work-related documents and needs from the treatment team regarding medications. Lori Jensen stated needs and engaged in discussion.    Initial Therapeutic Activity: Counselor facilitated a check-in with Lori Jensen to assess for safety, sobriety and medication compliance.  Counselor also inquired about Lori Jensen's current emotional ratings, as well as any significant changes in thoughts, feelings or behavior since previous check in.  Lori Jensen presented for session on time and was alert, oriented x5, with no evidence or self-report of active SI/HI or A/V H.  Lori Jensen reported compliance with medication and denied use of alcohol or illicit substances.  Lori Jensen reported scores of 6/10 for depression, 9/10 for anxiety, and 6/10 for irritability.  Lori Jensen denied any recent outbursts or panic attacks.  Lori Jensen reported that a success was doing her makeup and putting on a nice outfit before her physical therapy appointment yesterday.  Lori Jensen reported that her goal today is to outreach her sister today about assistance with transportation ahead of the upcoming holidays.          Second Therapeutic Activity: Counselor introduced Lori Jensen, Lori Jensen, to provide psychoeducation on topic  of medication compliance with members today.  Lori provided psychoeducation on classes of medications such as antidepressants, antipsychotics, what symptoms they are intended to treat, and any side effects one might encounter while on a particular prescription.  Time was allowed for clients to ask any questions they might have of Lori Jensen regarding this specialty.  Intervention was effective, as evidenced by Lori Jensen participating in discussion with speaker on the subject, reporting that she had been on Klonopin  and ran out, which led to significant side effects, stating "It felt like withdrawals, I can't even explain it".  Lori Jensen was receptive to feedback from Jensen on importance of responsible medication compliance, and potential side effects that could result if discontinuing use unexpectedly.    Third Therapeutic Activity: Psycho-educational portion of group was co-facilitated by wellness director (Lori Louder, MS, MPH, CHES) focused on self-care in daily life. Facilitator and group members discussed presented materials regarding importance of sleep, diet, and exercise. Group members discussed any changes they are willing to make to improve an area of self-care in their lives (physical, psychological, emotional, spiritual, relationship, professional) to improve overall mental health as they continue with treatment.  Intervention was effective, as evidenced by Lori Jensen participating in discussion with speaker on the subject, reporting that she used to go to the gym every day and do strength training with machines that were available.  Lori Jensen reported that her goal is to get back into the gym once she is more motivated, but avoid triggering existing conditions such as fibromyalgia.  Lori Jensen inquired about speaker's knowledge of target heart rate and realistic goals to ensure progress without doing harm.    Assessment and Plan: Counselor recommends that Lori Jensen remain in IOP  treatment to better manage mental  health symptoms, ensure stability and pursue completion of treatment plan goals. Counselor recommends adherence to crisis/safety plan, taking medications as prescribed, and following up with medical professionals if any issues arise.    Follow Up Instructions: Counselor will send [employer] Teams link for session tomorrow.  Lori Jensen was advised to call back or seek an in-person evaluation if the symptoms worsen or if the condition fails to improve as anticipated.   Collaboration of Care:   Medication Management AEB Lori Kerns, NP                                           Case Manager AEB Lori Gaskins, CNA    Patient/Guardian was advised Release of Information must be obtained prior to any record release in order to collaborate their care with an outside provider. Patient/Guardian was advised if they have not already done so to contact the registration department to sign all necessary forms in order for us  to release information regarding their care.    Consent: Patient/Guardian gives verbal consent for treatment and assignment of benefits for services provided during this visit. Patient/Guardian expressed understanding and agreed to proceed.   I provided 180 minutes of non-face-to-face time during this encounter.   Lori Jensen, [place], LCAS 08/15/24

## 2024-08-16 ENCOUNTER — Other Ambulatory Visit (HOSPITAL_COMMUNITY): Attending: Psychiatry | Admitting: Licensed Clinical Social Worker

## 2024-08-16 DIAGNOSIS — F3181 Bipolar II disorder: Secondary | ICD-10-CM | POA: Insufficient documentation

## 2024-08-16 NOTE — Progress Notes (Signed)
 Virtual Visit via Video Note   I connected with Adali K. Blandino on 08/16/24 at  9:00 AM EDT by a video enabled telemedicine application and verified that I am speaking with the correct person using two identifiers.   At orientation to the IOP program, Case Manager discussed the limitations of evaluation and management by telemedicine and the availability of in person appointments. The patient expressed understanding and agreed to proceed with virtual visits throughout the duration of the program.   Location:  Patient: Patient Home Provider: [facility]   History of Present Illness: Bipolar II Disorder   Observations/Objective: Check In: Case Manager checked in with all participants to review discharge dates, insurance authorizations, work-related documents and needs from the treatment team regarding medications. Kyilee stated needs and engaged in discussion.    Initial Therapeutic Activity: Counselor facilitated a check-in with Janeliz to assess for safety, sobriety and medication compliance.  Counselor also inquired about Juliahna's current emotional ratings, as well as any significant changes in thoughts, feelings or behavior since previous check in.  Malia presented for session on time and was alert, oriented x5, with no evidence or self-report of active SI/HI or A/V H.  Harriet reported compliance with medication and denied use of alcohol or illicit substances.  Nirvi reported scores of 6/10 for depression, 9/10 for anxiety, and 0/10 for anger/irritability.  Baylyn denied any recent outbursts or panic attacks. Zilla reported that a success was talking about holiday plans with her daughter yesterday, who is offering to pay for a ticket so she can visit them during Christmas.  She reported that a struggle was feeling guilty about asking for help.  Normalee reported that her goal today is to sort through some mail she has been anxious about.            Second Therapeutic Activity: Counselor  engaged the group in discussion on managing work/life balance today to improve mental health and wellness.  Counselor explained how finding balance between responsibilities at home and work place can be challenging, and lead to increased stress.  Counselor facilitated discussion on what challenges members are currently, or have historically faced.  Counselor also discussed strategies for improving work/life balance while members work on their mental health during treatment.  Some of these included keeping track of time management; creating a list of priorities and scaling importance; setting realistic, measurable goals each day; establishing boundaries; taking care of health needs; and nurturing relationships at home and work for support.  Counselor inquired about areas where members feel they are excelling, as well as areas they could focus on during treatment. Intervention was effective, as evidenced by Nat actively participating in discussion on topic and reporting that she has struggled maintaining a healthy work life balance for decades, and the workers and environments in these spaces became 'toxic' and began to wear on her mental health.  She reported that eventually her doctor stepped in and advised her to go on leave, stating "I couldn't work anymore".  Kristalynn reported that she experienced several symptoms of burnout, including feeling tired and drained, lowered immunity, changing sleep habits, lack of motivation, and procrastination.  Tierria reported that she does not know whether she will ever return to a regular job, but she will keep strategies offered today in mind as potential solutions if she chooses to.   Assessment and Plan: Counselor recommends that Haidee remain in IOP treatment to better manage mental health symptoms, ensure stability and pursue completion of treatment plan goals.  Counselor recommends adherence to crisis/safety plan, taking medications as prescribed, and following up with  medical professionals if any issues arise.    Follow Up Instructions: Counselor will send [employer] Teams link for session tomorrow.  Azariah was advised to call back or seek an in-person evaluation if the symptoms worsen or if the condition fails to improve as anticipated.   Collaboration of Care:   Medication Management AEB Staci Kerns, NP                                           Case Manager AEB Ricka Gaskins, CNA    Patient/Guardian was advised Release of Information must be obtained prior to any record release in order to collaborate their care with an outside provider. Patient/Guardian was advised if they have not already done so to contact the registration department to sign all necessary forms in order for us  to release information regarding their care.    Consent: Patient/Guardian gives verbal consent for treatment and assignment of benefits for services provided during this visit. Patient/Guardian expressed understanding and agreed to proceed.   I provided 180 minutes of non-face-to-face time during this encounter.   Darleene Ricker, LCSW, LCAS 08/16/24

## 2024-08-17 ENCOUNTER — Other Ambulatory Visit (HOSPITAL_COMMUNITY): Admitting: Psychiatry

## 2024-08-17 ENCOUNTER — Telehealth (HOSPITAL_COMMUNITY): Payer: Self-pay | Admitting: Psychiatry

## 2024-08-17 NOTE — Telephone Encounter (Signed)
 D:  Pt informed the MH-IOP case manager that she wouldn't be attending group today d/t a scheduled appointment.  A:  Inform treatment team.  R:  Pt receptive.

## 2024-08-20 ENCOUNTER — Other Ambulatory Visit (HOSPITAL_COMMUNITY): Attending: Psychiatry | Admitting: Licensed Clinical Social Worker

## 2024-08-20 ENCOUNTER — Telehealth (HOSPITAL_COMMUNITY): Admitting: Psychiatry

## 2024-08-20 DIAGNOSIS — F3181 Bipolar II disorder: Secondary | ICD-10-CM | POA: Insufficient documentation

## 2024-08-20 DIAGNOSIS — F4310 Post-traumatic stress disorder, unspecified: Secondary | ICD-10-CM | POA: Insufficient documentation

## 2024-08-20 NOTE — Progress Notes (Signed)
 Virtual Visit via Video Note   I connected with Lori Jensen on 08/20/24 at  9:00 AM EDT by a video enabled telemedicine application and verified that I am speaking with the correct person using two identifiers.   At orientation to the IOP program, Case Manager discussed the limitations of evaluation and management by telemedicine and the availability of in person appointments. The patient expressed understanding and agreed to proceed with virtual visits throughout the duration of the program.   Location:  Patient: Patient Home Provider: [facility]   History of Present Illness: Bipolar II Disorder and PTSD   Observations/Objective: Check In: Case Manager checked in with all participants to review discharge dates, insurance authorizations, work-related documents and needs from the treatment team regarding medications. Lori Jensen stated needs and engaged in discussion.    Initial Therapeutic Activity: Counselor facilitated a check-in with Lori Jensen to assess for safety, sobriety and medication compliance.  Counselor also inquired about Lori Jensen's current emotional ratings, as well as any significant changes in thoughts, feelings or behavior since previous check in.  Lori Jensen presented for session on time and was alert, oriented x5, with no evidence or self-report of active SI/HI or A/V H.  Lori Jensen reported compliance with medication and denied use of alcohol or illicit substances.  Lori Jensen reported scores of 5/10 for depression, 8/10 for anxiety, and 0/10 for anger/irritability.  Lori Jensen denied any recent outbursts or panic attacks.  Lori Jensen reported that a success was seeing her sister over the weekend and learning that her daughter will buy her a ticket to visit them for Christmas.  Lori Jensen reported that a struggle has been trying to figure out who will watch her dog while she goes out of town.  Lori Jensen reported that her goal today is to meet up with some friends for dinner.          Second Therapeutic  Activity: Counselor introduced topic of grounding skills today.  Counselor defined these as simple strategies one can use to help detach from difficult thoughts or feelings temporarily by focusing on something else.  Counselor noted that grounding will not solve the problem at hand, but can provide the practitioner with time to regain control over their thoughts and/or feelings and prevent the situation from getting worse (i.e. interrupting a panic attack).  Counselor divided these into three categories (mental, physical, and soothing) and then provided examples of each which group members could practice during session.  Some of these included describing one's environment in detail or playing a categories game with oneself for mental category, taking a hot bath/shower, stretching, or carrying a grounding object for physical category, and saying kind statements, or visualizing people one cares about for soothing category.  Counselor inquired about which techniques members have used with success in the past, or will commit to learning, practicing, and applying now to improve coping abilities.  Intervention was effective, as evidenced by Lori Jensen participating in discussion on the subject, trying out several of the techniques during session, and expressing interest in adding several to her available coping skills, such as describing her environment in detail, playing a categories game involving listing different types of food, using her struggles to write a meaningful short story, watching a standup comedian that can make her laugh, describing an everyday activity in great detail such as shopping at the store, putting a weighted heat wrap on her shoulders, spinning her mother's ring on her hand, stretching, and engaging in deep breathing.   Assessment and Plan: Counselor recommends  that Lori Jensen remain in IOP treatment to better manage mental health symptoms, ensure stability and pursue completion of treatment plan  goals. Counselor recommends adherence to crisis/safety plan, taking medications as prescribed, and following up with medical professionals if any issues arise.    Follow Up Instructions: Counselor will send [employer] Teams link for session tomorrow.  Lori Jensen was advised to call back or seek an in-person evaluation if the symptoms worsen or if the condition fails to improve as anticipated.   Collaboration of Care:   Medication Management AEB Staci Kerns, NP                                           Case Manager AEB Ricka Gaskins, CNA    Patient/Guardian was advised Release of Information must be obtained prior to any record release in order to collaborate their care with an outside provider. Patient/Guardian was advised if they have not already done so to contact the registration department to sign all necessary forms in order for us  to release information regarding their care.    Consent: Patient/Guardian gives verbal consent for treatment and assignment of benefits for services provided during this visit. Patient/Guardian expressed understanding and agreed to proceed.   I provided 180 minutes of non-face-to-face time during this encounter.   Darleene Ricker, LCSW, LCAS 08/20/24

## 2024-08-21 ENCOUNTER — Other Ambulatory Visit (HOSPITAL_COMMUNITY): Attending: Psychiatry | Admitting: Psychiatry

## 2024-08-21 ENCOUNTER — Telehealth (HOSPITAL_COMMUNITY): Payer: Self-pay | Admitting: Psychiatry

## 2024-08-21 DIAGNOSIS — F4310 Post-traumatic stress disorder, unspecified: Secondary | ICD-10-CM | POA: Insufficient documentation

## 2024-08-21 DIAGNOSIS — F3181 Bipolar II disorder: Secondary | ICD-10-CM | POA: Insufficient documentation

## 2024-08-21 NOTE — Telephone Encounter (Signed)
 D:  Pt states she will not be attending virtual MH-IOP tomorrow d/t traveling out of state d/t the holiday.  A:  Excuse pt.  Inform the treatment team.

## 2024-08-21 NOTE — Progress Notes (Signed)
 Virtual Visit via Video Note   I connected with Lori Jensen on 08/21/24 at  9:00 AM EDT by a video enabled telemedicine application and verified that I am speaking with the correct person using two identifiers.   At orientation to the IOP program, Case Manager discussed the limitations of evaluation and management by telemedicine and the availability of in person appointments. The patient expressed understanding and agreed to proceed with virtual visits throughout the duration of the program.   Location:  Patient: Patient Home Provider: [facility]   History of Present Illness: Bipolar II Disorder and PTSD   Observations/Objective: Check In: Case Manager checked in with all participants to review discharge dates, insurance authorizations, work-related documents and needs from the treatment team regarding medications. Lori Jensen stated needs and engaged in discussion.    Initial Therapeutic Activity: Counselor facilitated a check-in with Lori Jensen to assess for safety, sobriety and medication compliance.  Counselor also inquired about Lori Jensen's current emotional ratings, as well as any significant changes in thoughts, feelings or behavior since previous check in.  Lori Jensen presented for session on time and was alert, oriented x5, with no evidence or self-report of active SI/HI or A/V H.  Lori Jensen reported compliance with medication and denied use of alcohol or illicit substances.  Lori Jensen reported scores of 8/10 for depression, 9/10 for anxiety, and 0/10 for anger/irritability.  Lori Jensen denied any recent outbursts or panic attacks.  Lori Jensen reported that a struggle was finding it difficult to fall asleep last night, which left her tired this morning.  She reported that one reason for this is worrying about the upcoming holiday and stressful interactions with family.  Lori Jensen reported that a success was going out to dinner with friends yesterday despite some initial hesitation.     Second Therapeutic  Activity: Counselor introduced Amanda Davee Lomax, [facility] Cha[facility] to provide psychoeducation on topic of Grief and Loss with members today.  Lori Jensen began discussion by checking in with the group about their baseline mood today, general thoughts on what grief means to them and how it has affected them personally in the past.  Lori Jensen provided information on how the process of grief/loss can differ depending upon one's unique culture, and categories of loss one could experience (i.e. loss of a person, animal, relationship, job, identity, etc).  Lori Jensen encouraged members to be mindful of how pervasive loss can be, and how to recognize signs which could indicate that this is having an impact on one's overall mental health and wellbeing.  Intervention was effective, as evidenced by Lori Jensen participating in discussion with speaker on the subject, reporting that she has been grieving the loss of quality family time during the holidays, as her son talks to her less often and will actively avoid her phone calls at times.  Lori Jensen reported that she also feels like she has lost some influence in her role as a parent, so she will try to focus more on her own self-care.  She stated "I'm going to try to back off and respect his boundaries".    Third Therapeutic Activity: Counselor invited members to engage in a voluntary guided imagery activity today for relaxation in order to improve mood and continue to increase available coping skills.  Counselor informed members beforehand that they could discontinue activity at any time should they become distressed.  Counselor guided members through process of getting comfortable, achieving relaxed breathing pattern, and then narrated visualization of walking across a beach setting, including various pleasant sensory details to  enhance experience (I.e. sand under foot, smell of salt in air, sound of crashing waves, etc).  Counselor inquired about effectiveness of exercise afterward with each  member, including their ability to visualize the scene, focus on narration, and impact this practice had upon mood.  Intervention was effective, as evidenced by Lori Jensen successfully participating in exercise, and reporting that she found this relaxing, and helped alleviate a lot of neck tension she was experiencing.     Assessment and Plan: Counselor recommends that Lori Jensen remain in IOP treatment to better manage mental health symptoms, ensure stability and pursue completion of treatment plan goals. Counselor recommends adherence to crisis/safety plan, taking medications as prescribed, and following up with medical professionals if any issues arise.    Follow Up Instructions: Counselor will send [employer] Teams link for session tomorrow.  Lori Jensen was advised to call back or seek an in-person evaluation if the symptoms worsen or if the condition fails to improve as anticipated.   Collaboration of Care:   Medication Management AEB Staci Kerns, NP                                           Case Manager AEB Ricka Gaskins, CNA    Patient/Guardian was advised Release of Information must be obtained prior to any record release in order to collaborate their care with an outside provider. Patient/Guardian was advised if they have not already done so to contact the registration department to sign all necessary forms in order for us  to release information regarding their care.    Consent: Patient/Guardian gives verbal consent for treatment and assignment of benefits for services provided during this visit. Patient/Guardian expressed understanding and agreed to proceed.   I provided 180 minutes of non-face-to-face time during this encounter.   Darleene Ricker, LCSW, LCAS 08/21/24

## 2024-08-22 ENCOUNTER — Other Ambulatory Visit (HOSPITAL_COMMUNITY)

## 2024-08-24 ENCOUNTER — Other Ambulatory Visit (HOSPITAL_COMMUNITY)

## 2024-08-27 ENCOUNTER — Other Ambulatory Visit (HOSPITAL_COMMUNITY): Attending: Psychiatry | Admitting: Licensed Clinical Social Worker

## 2024-08-27 DIAGNOSIS — F4310 Post-traumatic stress disorder, unspecified: Secondary | ICD-10-CM | POA: Diagnosis not present

## 2024-08-27 DIAGNOSIS — F3181 Bipolar II disorder: Secondary | ICD-10-CM | POA: Insufficient documentation

## 2024-08-27 NOTE — Progress Notes (Signed)
 Virtual Visit via Video Note   I connected with Lakera K. Sudduth on 08/27/24 at  9:00 AM EDT by a video enabled telemedicine application and verified that I am speaking with the correct person using two identifiers.   At orientation to the IOP program, Case Manager discussed the limitations of evaluation and management by telemedicine and the availability of in person appointments. The patient expressed understanding and agreed to proceed with virtual visits throughout the duration of the program.   Location:  Patient: Patient Home Provider: [facility]   History of Present Illness: Bipolar II Disorder and PTSD   Observations/Objective: Check In: Case Manager checked in with all participants to review discharge dates, insurance authorizations, work-related documents and needs from the treatment team regarding medications. Alee stated needs and engaged in discussion.    Initial Therapeutic Activity: Counselor facilitated a check-in with Akeisha to assess for safety, sobriety and medication compliance.  Counselor also inquired about Rokhaya's current emotional ratings, as well as any significant changes in thoughts, feelings or behavior since previous check in.  Ammie presented for session on time and was alert, oriented x5, with no evidence or self-report of active SI/HI or A/V H.  Nicholas reported compliance with medication and denied use of alcohol or illicit substances.  Carlyne reported scores of 7/10 for depression, 9/10 for anxiety, and 7/10 for anger/irritability.  Delanee denied any recent outbursts or panic attacks.  Armina reported that a struggle was having a "Bad Thanksgiving" after speaking to her son, and having a disappointing conversation due to learning that he got married without her present.  She reported that this triggered several panic attacks.  Titilayo reported that a success was getting a call from her daughter today, who informed her of some positive news that lifted Felcia's  spirits.  Ahmani reported that her goal today is to call her daughter after group to catch up more on how things are going for her.          Second Therapeutic Activity: Counselor introduced Amanda Davee Lomax, [facility] to provide psychoeducation on topic of Grief and Loss with members today.  Alan began discussion by checking in with the group about their baseline mood today, general thoughts on what grief means to them and how it has affected them personally in the past.  Alan provided information on how the process of grief/loss can differ depending upon one's unique culture, and categories of loss one could experience (i.e. loss of a person, animal, relationship, job, identity, etc).  Alan encouraged members to be mindful of how pervasive loss can be, and how to recognize signs which could indicate that this is having an impact on one's overall mental health and wellbeing.  Intervention was effective, as evidenced by Nat participating in discussion with speaker on the subject, answering an icebreaker question and reporting that recently with the upcoming holidays she is reflecting on her deceased father, as she feels like no one will love her like this ever again.  Sean was receptive to empathetic feedback from the chaplain on ways to enjoy the holiday season despite these feelings of loss.    Assessment and Plan: Counselor recommends that Michell remain in IOP treatment to better manage mental health symptoms, ensure stability and pursue completion of treatment plan goals. Counselor recommends adherence to crisis/safety plan, taking medications as prescribed, and following up with medical professionals if any issues arise.    Follow Up Instructions: Counselor will send [employer] Teams link for session  tomorrow.  Shyvonne was advised to call back or seek an in-person evaluation if the symptoms worsen or if the condition fails to improve as anticipated.   Collaboration of Care:   Medication  Management AEB Staci Kerns, NP                                           Case Manager AEB Ricka Gaskins, CNA    Patient/Guardian was advised Release of Information must be obtained prior to any record release in order to collaborate their care with an outside provider. Patient/Guardian was advised if they have not already done so to contact the registration department to sign all necessary forms in order for us  to release information regarding their care.    Consent: Patient/Guardian gives verbal consent for treatment and assignment of benefits for services provided during this visit. Patient/Guardian expressed understanding and agreed to proceed.   I provided 180 minutes of non-face-to-face time during this encounter.   Darleene Ricker, LCSW, LCAS 08/27/24

## 2024-08-28 ENCOUNTER — Other Ambulatory Visit (HOSPITAL_COMMUNITY): Admitting: Psychiatry

## 2024-08-28 ENCOUNTER — Telehealth (HOSPITAL_COMMUNITY): Payer: Self-pay | Admitting: Psychiatry

## 2024-08-28 NOTE — Telephone Encounter (Signed)
 D:  Pt left vm that she wouldn't be attending virtual MH-IOP today d/t illness and no sleep lastnight.  A:  Inform treatment team.

## 2024-08-29 ENCOUNTER — Other Ambulatory Visit (HOSPITAL_COMMUNITY): Admitting: Psychiatry

## 2024-08-29 ENCOUNTER — Telehealth (HOSPITAL_COMMUNITY): Payer: Self-pay | Admitting: Psychiatry

## 2024-08-29 NOTE — Telephone Encounter (Signed)
 D:  Pt didn't log on for virtual MH-IOP this morning.  A:  Placed call to pt.  According to pt, she fell back asleep and just woke up.  Pt will return to MH-IOP tomorrow.  Inform the treatment team.

## 2024-08-30 ENCOUNTER — Other Ambulatory Visit (HOSPITAL_COMMUNITY): Attending: Psychiatry | Admitting: Psychiatry

## 2024-08-30 DIAGNOSIS — F4310 Post-traumatic stress disorder, unspecified: Secondary | ICD-10-CM

## 2024-08-30 DIAGNOSIS — F3181 Bipolar II disorder: Secondary | ICD-10-CM | POA: Insufficient documentation

## 2024-08-30 NOTE — Progress Notes (Signed)
 Virtual Visit via Video Note  I connected with Lori Jensen on @TODAY @ at  9:00 AM EST by a video enabled telemedicine application and verified that I am speaking with the correct person using two identifiers.  Location: Patient: at home Provider: at office   I discussed the limitations of evaluation and management by telemedicine and the availability of in person appointments. The patient expressed understanding and agreed to proceed.  I discussed the assessment and treatment plan with the patient. The patient was provided an opportunity to ask questions and all were answered. The patient agreed with the plan and demonstrated an understanding of the instructions.   The patient was advised to call back or seek an in-person evaluation if the symptoms worsen or if the condition fails to improve as anticipated.  I provided 30 minutes of non-face-to-face time during this encounter.   Lori Jensen, M.Ed,CNA   Patient ID: Lori Jensen, female   DOB: 09/21/1971, 53 y.o.   MRN: [id number] D:  As per previous CCA states:  Lori Jensen reports due to increased depression and recent SI. Stressors: 1) Family: daughter lives in [place] with 2yo grandson- she is unable to get to him often due to finances. Son's gf is pregnant but relationship with son is "strained". Stepfather relies on her "I'm the only person. He is a very negative and grumpy person. His life alert thing will call me." 2) MH: increased depression, anxiety, decreased ADLs. "There are so many things I am unable to do because of the depression." 3) recent SI: She reports she had thoughts of how she could OD on her meds 3 nights ago but denies any intent or thoughts at this time. 4) Physical health: Fibromyalgia which causes physical pain 5) Financial: on disability and money is tight- sometimes spends grocery money on kids or grandkids where she doesn't know how she will eat. She does have a church resource where she gets some help with food. 6)  Guilt related to needing help with food 7) In an unhealthy relationship with a man on/off for 19 years: he got married in the last 2 years and she is still seeing him: "I feel really bad about myself because I do that. There is a part of me that says he was mine before he was hers. I feel like I'm not good enough because I'm not the one he married." Treatment history includes 1 hospitalizations at [facility] sometime in the last 6 years (she could not remember when), [facility] 2019, [facility] 2019 & 2023, and [facility]. She reports on/off therapy. She denies attempts, current SI/HI/AVH, drug use, NSSIB. She reports continued pSI. She reports she has had AVH in the past of hearing men outside, or a tv on in another room, or seeing someone pass by her when no one is there. She reports Dr. Vallorie believed these to be related to her PTSD, not psychosis. PF: grandchildren, children, dog. She reports family history includes Mom attempting suicide multiple times by cutting after telling Lori Jensen she was going into the bathroom to do so, and sister that does not get help. She lives alone with her dog. Her supports include her bff and neighbor. Medical diagnosis include fibromyalgia, degenerative disc issues, arthritis.   Pt stepped down from PHP to virtual MH-IOP today.  Scored 17 on the PHQ-9.  Denies SI/HI or A/V hallucinations.  Pt mentioned she still prefers in person meetings over virtual.  C/O anhedonia, sadness, poor sleep, low energy, decreased  appetite, low self-esteem, ruminating thoughts, and poor concentration.  Rates her depression at a 8 and anxiety at a 9 (Scale 1-10; 10 being the worst).  Pt sees Dr. Arfeen and Todd Brooks, LCSW.  Pt will complete MH-IOP tomorrow (08-31-24).  If pt attends tomorrow, she would have attended ten MH-IOP.  Pt reports that the groups have been very helpful.  States she is still struggling with the loss of relationship with her son and him getting married and not telling her. On a scale  of 1-10 (10 being the worst); pt rates her anxiety at a 9 and depression at a 8.  Denies SI/HI or A/V hallucinations.  Pt plans to follow up with [facility].  Pt is looking to forward to visiting her daughter and her grandbaby for the Christmas Holiday. A:  Discharge tomorrow.  F/U with Dr. Arfeen on 09-03-24 and pt plans to contact Todd Brooks, LCSW for a f/u appt.  Strongly recommend support groups via [facility] and [employer]. Pt was advised of ROI must be obtained prior to any records release in order to collaborate her care with an outside provider.  Pt was advised if she has not already done so to contact the front desk to sign all necessary forms in order for MH-IOP to release info re: her care.  Consent:  Pt gives verbal consent for tx and assignment of benefits for services provided during this telehealth group process.  Pt expressed understanding and agreed to proceed. Collaboration of care:  Collaborate with Staci Kerns, NP AEB, Dr. Curry  AEB; Darleene Ricker, LCSW AEB and Todd Humbles, LCSW, AEB. R:  Pt receptive.  Ricka Gaskins, M.Ed,CNA

## 2024-08-30 NOTE — Patient Instructions (Signed)
 D:  Patient will discharge on 08-31-24 from virtual MH-IOP.  A:  Follow up with Dr. Curry on 09-03-24 and call for appt with Todd Brooks, LCSW.  Encouraged support groups.  R:  Patient receptive.

## 2024-08-30 NOTE — Progress Notes (Signed)
 Virtual Visit via Video Note   I connected with Autumnrose K. Scholz on 08/30/24 at  9:00 AM EDT by a video enabled telemedicine application and verified that I am speaking with the correct person using two identifiers.   At orientation to the IOP program, Case Manager discussed the limitations of evaluation and management by telemedicine and the availability of in person appointments. The patient expressed understanding and agreed to proceed with virtual visits throughout the duration of the program.   Location:  Patient: Patient Home Provider: [facility]   History of Present Illness: Bipolar II Disorder and PTSD    Observations/Objective: Check In: Case Manager checked in with all participants to review discharge dates, insurance authorizations, work-related documents and needs from the treatment team regarding medications. Cheria stated needs and engaged in discussion.    Initial Therapeutic Activity: Counselor facilitated a check-in with Jackline to assess for safety, sobriety and medication compliance.  Counselor also inquired about Payden's current emotional ratings, as well as any significant changes in thoughts, feelings or behavior since previous check in.  Bethaney presented for session on time and was alert, oriented x5, with no evidence or self-report of active HI or A/V H.  Yannis reported that she was experiencing passive SI without intent or plan today, but could contract for safety, agreeing to go to the hospital for assessment should intent or plan to harm herself develop.  Barby reported compliance with medication and denied use of alcohol or illicit substances.  Claudine reported scores of 8/10 for depression, 10/10 for anxiety, and 4/10 for irritability.  Najma denied any recent outbursts or panic attacks.  Tyrihanna reported that a struggle was sleeping poorly yesterday, which led her to miss group.  She reported that she is also still struggling with news of her son getting married  without her involvement in the wedding.  Sun reported that a success has been outreaching supports to try to talk about how this has affected her.  Jericka reported that her goal today is to attend a [facility] group in the afternoon.          Second Therapeutic Activity: Counselor covered topic of attachment styles today.  Counselor virtually shared a handout with the group on this topic which defined attachment styles as how people think about and behave in relationships.  Styles were broken down by category, including secure attachment where one believes close relationships are trustworthy, compared to insecure attachment (i.e. anxious, avoidant, or anxious-avoidant) where one is distrusting or worries about their bond with others.  Counselor inquired about which attachment style members most related to, how this has influenced their mental health/well-being, and whether they intend to begin making any changes.  Intervention was effective, as evidenced by Nat participating in discussion, and reporting that she most identified with the anxious attachment style due to traits such as being distrustful of others, fearful of abandonment, and sensitive to criticism.  Janeshia reported that due to past trauma and her mother's parenting style, she finds it hard to trust people and struggles with low self-esteem.  Yolunda reported that she is motivated to seek out healthier relationships with people that appreciate her and support her, and has been trying to engage in support groups like [facility] and [facility] to expand support.  She reported that she will also manage healthier boundaries with family members that can make her feel overwhelmed and seem self-centered, like her sister.      Assessment and Plan: Counselor recommends that [place]  remain in IOP treatment to better manage mental health symptoms, ensure stability and pursue completion of treatment plan goals. Counselor recommends adherence to crisis/safety  plan, taking medications as prescribed, and following up with medical professionals if any issues arise.    Follow Up Instructions: Counselor will send [employer] Teams link for session tomorrow.  Yury was advised to call back or seek an in-person evaluation if the symptoms worsen or if the condition fails to improve as anticipated.   Collaboration of Care:   Medication Management AEB Staci Kerns, NP                                           Case Manager AEB Ricka Gaskins, CNA    Patient/Guardian was advised Release of Information must be obtained prior to any record release in order to collaborate their care with an outside provider. Patient/Guardian was advised if they have not already done so to contact the registration department to sign all necessary forms in order for us  to release information regarding their care.    Consent: Patient/Guardian gives verbal consent for treatment and assignment of benefits for services provided during this visit. Patient/Guardian expressed understanding and agreed to proceed.   I provided 180 minutes of non-face-to-face time during this encounter.   Darleene Ricker, LCSW, LCAS 08/30/24

## 2024-08-30 NOTE — Progress Notes (Signed)
 Virtual Visit via Video Note  I connected with Lawana K Lawes on 08/30/24 at  9:00 AM EST by a video enabled telemedicine application and verified that I am speaking with the correct person using two identifiers.  Location: Patient: Home Provider: Office   I discussed the limitations of evaluation and management by telemedicine and the availability of in person appointments. The patient expressed understanding and agreed to proceed   I discussed the assessment and treatment plan with the patient. The patient was provided an opportunity to ask questions and all were answered. The patient agreed with the plan and demonstrated an understanding of the instructions.   The patient was advised to call back or seek an in-person evaluation if the symptoms worsen or if the condition fails to improve as anticipated.  I provided 15 minutes of non-face-to-face time during this encounter.   Staci LOISE Kerns, NP   [facility] Intensive Outpatient Program Discharge Summary  DAURICE OVANDO [id number]  Admission date: 08/10/2024 Discharge date: 08/31/2024  Reason for admission: Per admission assessment note:  [name] is a 53 year old female who presents due to worsening depression and anxiety symptoms. Patient carries a diagnosis related to depression, anxiety and bipolar disorder. She is currently prescribed Wellbutrin , Klonopin  and amitriptyline. She reports she is taking and tolerating medications well reports experiencing hopelessness, increased depression and suicidal ideations with a plan to overdose. Reports struggling with self-esteem issues and reports she is codependent. She reports she and her son who is 24 has a slightly strained relationship. States she has a daughter who is 46 who resides in [place]  does not understand mental illness which can be frustrating at times when she attempts to have conversations with therapist.   Progress in Program Toward Treatment Goals:  Progressing patient recently completed partial hospitalization programming.  Is nearing the end of intensive outpatient programming.  Ongoing ruminations related to not being invited to her son's wedding.  Reports increased depression and anxiety symptoms.  States she continues to work through a process feelings related to grief.  Does appear to be future and goal oriented as she states plans to write a book about her experiences.  Has follow-up appointments with [facility] and is eager to start trauma-based therapy on January 14.  Reports she is currently followed by Stephane Brooks at the [facility].  Progress (rationale): Follow-up with psychiatrist Arfeen 12/09  Take all of you medications as prescribed by your mental healthcare provider.  Report any adverse effects and reactions from your medications to your outpatient provider promptly.  Do not engage in alcohol and or illegal drug use while on prescription medicines. Keep all scheduled appointments. This is to ensure that you are getting refills on time and to avoid any interruption in your medication.  If you are unable to keep an appointment call to reschedule.  Be sure to follow up with resources and follow ups given. In the event of worsening symptoms call the crisis hotline, 911, and or go to the nearest emergency department for appropriate evaluation and treatment of symptoms. Follow-up with your primary care provider for your medical issues, concerns and or health care needs.    Collaboration of Care: Medication Management AEB has a follow-up with Dr. Afreen.   Patient/Guardian was advised Release of Information must be obtained prior to any record release in order to collaborate their care with an outside provider. Patient/Guardian was advised if they have not already done so to contact the registration department to sign  all necessary forms in order for us  to release information regarding their care.   Consent: Patient/Guardian  gives verbal consent for treatment and assignment of benefits for services provided during this visit. Patient/Guardian expressed understanding and agreed to proceed.   Staci Kerns NP  08/30/2024

## 2024-08-31 ENCOUNTER — Other Ambulatory Visit (HOSPITAL_COMMUNITY): Attending: Psychiatry | Admitting: Licensed Clinical Social Worker

## 2024-08-31 DIAGNOSIS — F4310 Post-traumatic stress disorder, unspecified: Secondary | ICD-10-CM | POA: Diagnosis present

## 2024-08-31 DIAGNOSIS — F3181 Bipolar II disorder: Secondary | ICD-10-CM | POA: Insufficient documentation

## 2024-08-31 NOTE — Progress Notes (Signed)
 Virtual Visit via Video Note   I connected with Rinda K. Jacome on 08/31/24 at  9:00 AM EDT by a video enabled telemedicine application and verified that I am speaking with the correct person using two identifiers.   At orientation to the IOP program, Case Manager discussed the limitations of evaluation and management by telemedicine and the availability of in person appointments. The patient expressed understanding and agreed to proceed with virtual visits throughout the duration of the program.   Location:  Patient: Patient Home Provider: Home Office   History of Present Illness: Bipolar II Disorder and PTSD   Observations/Objective: Check In: Case Manager checked in with all participants to review discharge dates, insurance authorizations, work-related documents and needs from the treatment team regarding medications. Aide stated needs and engaged in discussion.    Initial Therapeutic Activity: Counselor facilitated a check-in with Lourie to assess for safety, sobriety and medication compliance.  Counselor also inquired about Destony's current emotional ratings, as well as any significant changes in thoughts, feelings or behavior since previous check in.  Callahan presented for session on time and was alert, oriented x5, with no evidence or self-report of active SI/HI or A/V H.  Shloka reported compliance with medication and denied use of alcohol or illicit substances.  Hadassah reported scores of 6/10 for depression, 9/10 for anxiety, and 0/10 for anger/irritability.  Adrinne denied any recent outbursts or panic attacks.  Keashia reported that a success was attending her first [facility] [employer] event yesterday, which she enjoyed despite some initial nervousness.  Kalley reported that her goal today is to work on organizing her home after group.     Second Therapeutic Activity: Counselor discussed topic of distress tolerance today with group.  Counselor shared virtual handout with members that  offered a DBT approach represented by the acronym ACCEPTS, and outlined strategies for distracting oneself from distressing emotions, allowing appropriate time for these emotions to lesson in intensity and eventually fade away.  Strategies offered included engaging in positive activities, contributing to the wellbeing of others, comparing one's present situation to a previously difficult one to highlight resilience, using mental imagery, and physical grounding.  Counselor assisted members in creating their own realistic ACCEPTS plan for tackling a distressing emotion of their choice.  Intervention was effective, as evidenced by Nat participating in creation of the plan, choosing sadness as her emotion of focus, and identifying several helpful approaches for distraction, such as watching a TV show or movie that lifts her spirits, giving a compliment to a loved one, or reciting the alphabet backward.   Third Therapeutic Activity: Psycho-educational portion of group was provided by Lorane Rochester, interior and spatial designer of community education with [employer].  Alexandra provided information on history of her local agency, mission statement, and the variety of unique services offered which group members might find beneficial to engage in, including both virtual and in-person support groups, as well as peer support program for mentoring.  Alexandra offered time to answer member's questions regarding services and encouraged them to consider utilizing these services to assist in working towards their individual wellness goals.  Intervention was effective, as evidenced by Nat participating in discussion with speaker on the subject, reporting that she attended an event through [employer] yesterday, and enjoyed it, so she will plan to attend other social gatherings to gain support and learn more about her mental health needs. She reported that she would refer someone else in her support network to the support group  for men.  Assessment and Plan: Renesmee has completed MHIOP and will be discharged today.  Counselor recommends adherence to crisis/safety plan, taking medications as prescribed, and following up with medical professionals if any issues arise.    Follow Up Instructions: Krissie was advised to call back or seek an in-person evaluation if the symptoms worsen or if the condition fails to improve as anticipated.   Collaboration of Care:   Medication Management AEB Staci Kerns, NP                                           Case Manager AEB Ricka Gaskins, CNA    Patient/Guardian was advised Release of Information must be obtained prior to any record release in order to collaborate their care with an outside provider. Patient/Guardian was advised if they have not already done so to contact the registration department to sign all necessary forms in order for us  to release information regarding their care.    Consent: Patient/Guardian gives verbal consent for treatment and assignment of benefits for services provided during this visit. Patient/Guardian expressed understanding and agreed to proceed.   I provided 180 minutes of non-face-to-face time during this encounter.   Darleene Ricker, LCSW, LCAS 08/31/24

## 2024-09-01 ENCOUNTER — Other Ambulatory Visit (HOSPITAL_COMMUNITY): Payer: Self-pay | Admitting: Psychiatry

## 2024-09-01 DIAGNOSIS — F3181 Bipolar II disorder: Secondary | ICD-10-CM

## 2024-09-03 ENCOUNTER — Encounter (HOSPITAL_COMMUNITY): Payer: Self-pay | Admitting: Psychiatry

## 2024-09-03 ENCOUNTER — Telehealth (HOSPITAL_COMMUNITY): Admitting: Psychiatry

## 2024-09-03 VITALS — Wt 186.0 lb

## 2024-09-03 DIAGNOSIS — F99 Mental disorder, not otherwise specified: Secondary | ICD-10-CM

## 2024-09-03 DIAGNOSIS — F4312 Post-traumatic stress disorder, chronic: Secondary | ICD-10-CM

## 2024-09-03 DIAGNOSIS — F3181 Bipolar II disorder: Secondary | ICD-10-CM

## 2024-09-03 MED ORDER — OLANZAPINE 5 MG PO TABS: 5.0000 mg | ORAL_TABLET | Freq: Every day | ORAL | 1 refills | Status: DC

## 2024-09-03 MED ORDER — CLONAZEPAM 1 MG PO TABS: 1.0000 mg | ORAL_TABLET | Freq: Every day | ORAL | 1 refills | Status: DC

## 2024-09-03 MED ORDER — BUPROPION HCL ER (XL) 300 MG PO TB24: 300.0000 mg | ORAL_TABLET | Freq: Every day | ORAL | 0 refills | Status: DC

## 2024-09-03 MED ORDER — GABAPENTIN 600 MG PO TABS: 600.0000 mg | ORAL_TABLET | Freq: Three times a day (TID) | ORAL | 1 refills | Status: DC
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
## 2024-09-28 ENCOUNTER — Other Ambulatory Visit (HOSPITAL_COMMUNITY): Payer: Self-pay | Admitting: Psychiatry

## 2024-09-28 DIAGNOSIS — F5105 Insomnia due to other mental disorder: Secondary | ICD-10-CM

## 2024-09-28 DIAGNOSIS — F3181 Bipolar II disorder: Secondary | ICD-10-CM

## 2024-10-09 ENCOUNTER — Encounter (HOSPITAL_COMMUNITY): Payer: Self-pay | Admitting: Psychiatry

## 2024-10-09 ENCOUNTER — Telehealth (HOSPITAL_COMMUNITY): Payer: Medicare (Managed Care) | Admitting: Psychiatry

## 2024-10-09 VITALS — Wt 189.0 lb

## 2024-10-09 DIAGNOSIS — F99 Mental disorder, not otherwise specified: Secondary | ICD-10-CM

## 2024-10-09 DIAGNOSIS — F3181 Bipolar II disorder: Secondary | ICD-10-CM | POA: Diagnosis not present

## 2024-10-09 DIAGNOSIS — F5105 Insomnia due to other mental disorder: Secondary | ICD-10-CM

## 2024-10-09 DIAGNOSIS — F4312 Post-traumatic stress disorder, chronic: Secondary | ICD-10-CM

## 2024-10-09 MED ORDER — BUPROPION HCL ER (XL) 300 MG PO TB24: 300.0000 mg | ORAL_TABLET | Freq: Every day | ORAL | 0 refills | Status: AC

## 2024-10-09 MED ORDER — CLONAZEPAM 1 MG PO TABS: 1.0000 mg | ORAL_TABLET | Freq: Every day | ORAL | 2 refills | Status: AC

## 2024-10-09 MED ORDER — GABAPENTIN 600 MG PO TABS: 600.0000 mg | ORAL_TABLET | Freq: Three times a day (TID) | ORAL | 0 refills | Status: AC

## 2024-10-09 MED ORDER — OLANZAPINE 5 MG PO TABS: 5.0000 mg | ORAL_TABLET | Freq: Every day | ORAL | 0 refills | Status: AC
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
## 2024-10-10 ENCOUNTER — Encounter: Payer: Self-pay | Admitting: Psychiatry

## 2024-10-10 ENCOUNTER — Ambulatory Visit (INDEPENDENT_AMBULATORY_CARE_PROVIDER_SITE_OTHER): Payer: Medicare (Managed Care) | Admitting: Psychiatry

## 2024-10-10 DIAGNOSIS — F3181 Bipolar II disorder: Secondary | ICD-10-CM
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
## 2024-11-01 ENCOUNTER — Ambulatory Visit (INDEPENDENT_AMBULATORY_CARE_PROVIDER_SITE_OTHER): Payer: Medicare (Managed Care) | Admitting: Psychiatry

## 2024-11-01 ENCOUNTER — Encounter: Payer: Self-pay | Admitting: Psychiatry

## 2024-11-01 DIAGNOSIS — F3181 Bipolar II disorder: Secondary | ICD-10-CM | POA: Diagnosis not present
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
## 2024-11-21 ENCOUNTER — Ambulatory Visit: Payer: Medicare (Managed Care) | Admitting: Psychiatry

## 2024-12-05 ENCOUNTER — Ambulatory Visit: Payer: Medicare (Managed Care) | Admitting: Psychiatry

## 2024-12-19 ENCOUNTER — Ambulatory Visit: Payer: Medicare (Managed Care) | Admitting: Psychiatry

## 2025-01-02 ENCOUNTER — Ambulatory Visit: Payer: Medicare (Managed Care) | Admitting: Psychiatry

## 2025-01-08 ENCOUNTER — Telehealth (HOSPITAL_COMMUNITY): Admitting: Psychiatry

## 2025-01-16 ENCOUNTER — Ambulatory Visit: Payer: Medicare (Managed Care) | Admitting: Psychiatry
